# Patient Record
Sex: Female | Born: 1945
Health system: Southern US, Community
[De-identification: ages and names within clinical notes are randomized; demographics above are authoritative.]

## PROBLEM LIST (undated history)

## (undated) DIAGNOSIS — K219 Gastro-esophageal reflux disease without esophagitis: Secondary | ICD-10-CM

## (undated) DIAGNOSIS — D649 Anemia, unspecified: Secondary | ICD-10-CM

## (undated) DIAGNOSIS — I10 Essential (primary) hypertension: Secondary | ICD-10-CM

## (undated) DIAGNOSIS — J439 Emphysema, unspecified: Secondary | ICD-10-CM

## (undated) DIAGNOSIS — J449 Chronic obstructive pulmonary disease, unspecified: Secondary | ICD-10-CM

## (undated) DIAGNOSIS — C801 Malignant (primary) neoplasm, unspecified: Secondary | ICD-10-CM

## (undated) DIAGNOSIS — F419 Anxiety disorder, unspecified: Secondary | ICD-10-CM

## (undated) DIAGNOSIS — M810 Age-related osteoporosis without current pathological fracture: Secondary | ICD-10-CM

## (undated) DIAGNOSIS — M1990 Unspecified osteoarthritis, unspecified site: Secondary | ICD-10-CM

## (undated) DIAGNOSIS — E079 Disorder of thyroid, unspecified: Secondary | ICD-10-CM

## (undated) HISTORY — DX: Disorder of thyroid, unspecified: E07.9

## (undated) HISTORY — PX: JOINT REPLACEMENT: SHX530

## (undated) HISTORY — DX: Malignant (primary) neoplasm, unspecified: C80.1

## (undated) HISTORY — PX: ABDOMINAL HYSTERECTOMY: SHX81

## (undated) HISTORY — DX: Age-related osteoporosis without current pathological fracture: M81.0

## (undated) HISTORY — DX: Emphysema, unspecified: J43.9

## (undated) HISTORY — DX: Anemia, unspecified: D64.9

## (undated) HISTORY — DX: Unspecified osteoarthritis, unspecified site: M19.90

## (undated) HISTORY — DX: Anxiety disorder, unspecified: F41.9

## (undated) HISTORY — DX: Gastro-esophageal reflux disease without esophagitis: K21.9

## (undated) HISTORY — DX: Chronic obstructive pulmonary disease, unspecified: J44.9

## (undated) HISTORY — PX: BREAST SURGERY: SHX581

---

## 2004-12-05 HISTORY — PX: TOTAL KNEE ARTHROPLASTY: SHX125

## 2011-10-31 ENCOUNTER — Encounter: Payer: Self-pay | Admitting: *Deleted

## 2011-10-31 ENCOUNTER — Emergency Department
Admission: EM | Admit: 2011-10-31 | Discharge: 2011-10-31 | Disposition: A | Discharge status: Home / Self Care | Payer: Self-pay | Source: Home / Self Care | Attending: Emergency Medicine | Admitting: Emergency Medicine

## 2011-10-31 DIAGNOSIS — S61209A Unspecified open wound of unspecified finger without damage to nail, initial encounter: Secondary | ICD-10-CM

## 2011-10-31 HISTORY — DX: Essential (primary) hypertension: I10

## 2011-10-31 MED ORDER — CEPHALEXIN 500 MG PO CAPS: 500.0000 mg | ORAL_CAPSULE | Freq: Two times a day (BID) | ORAL | Status: AC

## 2011-10-31 MED ORDER — TETANUS-DIPHTH-ACELL PERTUSSIS 5-2.5-18.5 LF-MCG/0.5 IM SUSP
0.5000 mL | Freq: Once | INTRAMUSCULAR | Status: AC
  Administered 2011-10-31: 0.5 mL via INTRAMUSCULAR

## 2011-10-31 NOTE — ED Provider Notes (Addendum)
History     CSN: [id number] Arrival date & time: 10/31/2011  4:41 PM   First MD Initiated Contact with Patient 10/31/11 1732      Chief Complaint  Patient presents with  . Extremity Laceration    (Consider location/radiation/quality/duration/timing/severity/associated sxs/prior treatment) HPI This is a right-handed female who comes in today complaining of a laceration on her left pinky finger. She slammed it in a door but does not think is broken and does not want an x-ray. She is wondering if she needed stitches. She did notice some bleeding from the wound and some throbbing pain. She has not been using any medicines or modalities at this point. She does not report having difficulty moving her finger.  Past Medical History  Diagnosis Date  . Hypertension   . Asthma     Past Surgical History  Procedure Date  . Joint replacement     LT KNEE  . Abdominal hysterectomy     Family History  Problem Relation Age of Onset  . Emphysema Mother     History  Substance Use Topics  . Smoking status: Former Games developer  . Smokeless tobacco: Not on file  . Alcohol Use: Yes     SOCIALLY    OB History    Grav Para Term Preterm Abortions TAB SAB Ect Mult Living                  Review of Systems  Allergies  Floxin; Other; and Tetracyclines & related  Home Medications   Current Outpatient Rx  Name Route Sig Dispense Refill  . OLMESARTAN MEDOXOMIL 20 MG PO TABS Oral Take 20 mg by mouth daily.      . CEPHALEXIN 500 MG PO CAPS Oral Take 1 capsule (500 mg total) by mouth 2 (two) times daily. 14 capsule 0    BP 151/96  Pulse 68  Temp(Src) 98.1 F (36.7 C) (Oral)  Resp 16  Ht 5' 1.5" (1.562 m)  Wt 130 lb (58.968 kg)  BMI 24.17 kg/m2  SpO2 98%  Physical Exam  Nursing note and vitals reviewed. Constitutional: She is oriented to person, place, and time. She appears well-developed and well-nourished.  HENT:  Head: Normocephalic and atraumatic.  Neck: Neck supple.    Cardiovascular: Regular rhythm and normal heart sounds.   Pulmonary/Chest: Effort normal and breath sounds normal. No respiratory distress.  Neurological: She is alert and oriented to person, place, and time.  Skin: Skin is warm and dry.       Left pinky finger on the radial aspect has a 1 cm laceration, very superficial, with a slight amount of fatty tissue extruding from that. The wound is very well approximated there is slight bruising and swelling to the area. She has full range of motion with flexion and extension of her DIPs, PIPs, and MCP. Her distal sensation and capillary refill is normal.  Psychiatric: She has a normal mood and affect. Her speech is normal.    ED Course  Procedures (including critical care time)  Labs Reviewed - No data to display No results found.   1. Wound, open, finger       MDM   I discussed with the patient I do not feel that she requires any stitches today. The wound come back very nicely and there does not to be any tension on it and is not associated with with a joint. However since she does fashion the door and does have a laceration I do feel it appropriate  to at least treated mildly with antibiotics. We also did wrap it up with Dermabond, Steri-Strips, and placed her in a very small finger splint to limit her movement over the next couple days. 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 She should also keep covered with a rubber glove to prevent it from getting wet. If any further signs of infection or worsening, then she needs to call her physician or call us. In the meantime she can expect some throbbing pain, so she can take ibuprofen or Tylenol or use ice for that. In addition as I stated in the history of present illness section she would like to hold off on getting x-ray for now. She may indeed have a broken distal phalanx such as a tuft fracture, however I  think this can wait for a few days and she will return to clinic if she is having further pain and would like an x-ray.     Lily Kocher, MD 10/31/11 1749  Lily Kocher, MD 10/31/11 [phone]

## 2011-10-31 NOTE — ED Notes (Signed)
Pt c/o laceration LT 5th digit x today, she slammed it in a car door.

## 2012-01-30 LAB — CBC AND DIFFERENTIAL
Hemoglobin: 13.1 g/dL (ref 12.0–16.0)
Platelets: 238 10*3/uL (ref 150–399)
WBC: 8.7 10^3/mL

## 2012-01-30 LAB — LIPID PANEL
Cholesterol: 181 mg/dL (ref 0–200)
HDL: 72 mg/dL — AB (ref 35–70)

## 2012-01-30 LAB — HEPATIC FUNCTION PANEL: AST: 25 U/L (ref 13–35)

## 2012-01-30 LAB — BASIC METABOLIC PANEL: Glucose: 74 mg/dL

## 2012-05-16 DIAGNOSIS — M778 Other enthesopathies, not elsewhere classified: Secondary | ICD-10-CM | POA: Insufficient documentation

## 2012-05-16 DIAGNOSIS — G5600 Carpal tunnel syndrome, unspecified upper limb: Secondary | ICD-10-CM | POA: Insufficient documentation

## 2013-01-31 ENCOUNTER — Encounter: Payer: Self-pay | Admitting: *Deleted

## 2013-01-31 ENCOUNTER — Encounter: Payer: Self-pay | Admitting: Family Medicine

## 2013-01-31 ENCOUNTER — Ambulatory Visit (INDEPENDENT_AMBULATORY_CARE_PROVIDER_SITE_OTHER): Payer: Medicare Other | Admitting: Family Medicine

## 2013-01-31 VITALS — BP 139/93 | HR 88 | Temp 97.9°F | Ht 61.75 in | Wt 137.0 lb

## 2013-01-31 DIAGNOSIS — J189 Pneumonia, unspecified organism: Secondary | ICD-10-CM | POA: Insufficient documentation

## 2013-01-31 DIAGNOSIS — E876 Hypokalemia: Secondary | ICD-10-CM

## 2013-01-31 DIAGNOSIS — M810 Age-related osteoporosis without current pathological fracture: Secondary | ICD-10-CM | POA: Insufficient documentation

## 2013-01-31 DIAGNOSIS — B009 Herpesviral infection, unspecified: Secondary | ICD-10-CM | POA: Insufficient documentation

## 2013-01-31 DIAGNOSIS — Z09 Encounter for follow-up examination after completed treatment for conditions other than malignant neoplasm: Secondary | ICD-10-CM

## 2013-01-31 DIAGNOSIS — I10 Essential (primary) hypertension: Secondary | ICD-10-CM | POA: Insufficient documentation

## 2013-01-31 DIAGNOSIS — D72829 Elevated white blood cell count, unspecified: Secondary | ICD-10-CM

## 2013-01-31 MED ORDER — ACYCLOVIR 800 MG PO TABS: 800.0000 mg | ORAL_TABLET | Freq: Two times a day (BID) | ORAL | Status: DC

## 2013-01-31 NOTE — Progress Notes (Signed)
CC: Margaret [name] is a 67 y.o. female is here for Establish Care and hospital f/u   Subjective: HPI:  Very pleasant 67 year old here to establish care.  Patient was admitted to the hospital on February 22 for acute onset shortness of breath, fever, fatigue with cough. She was found to have right upper lobe pneumonia and was started on Rocephin and Zithromax. She had a white count of 26 and elevated to 35 24 hours after starting Solu-Medrol.  She was requiring oxygen the hospital but at time of discharge had normal oxygen saturations. She was discharged on February 24 with a prednisone taper, 2 days of azithromycin and 7 days of Omnicef which she confirms she is taking as prescribed. She tells me she has a mild cough but she is feeling remarkably better. She denies any shortness of breath nor fatigue. She denies confusion, fevers, chills, or lack of appetite. She woke up this morning feeling energy wise that she is almost back to normal. She has a history of asthma that may in fact be COPD for which she takes Advair twice a day. She denies chest pain, productive cough, nor blood in the sputum. Of note blood cultures were negative in the hospital.  During her admission she was noted to have hypokalemia with discharge potassium at 3.8 after receiving oral potassium of an unknown amount.  During her admission she was noted to have hypophosphatemia at 1.5 and was normalized after receiving replacement therapy at an unknown dose.  She has a history of hyper-tension with no outside or hospital blood pressures to report that she believes that she has been well-controlled with Benicar once a day.  She has a blister on her left upper and lower lip that is quite painful. The lower lip started while hospitalized, upper lip has just begun within the last 12 hours. She has had one episode of this before years ago. She denies history of genital lesions similar in appearance or sensation.  Review of Systems -  General ROS: negative for - chills, fever, night sweats, weight gain or weight loss Ophthalmic ROS: negative for - decreased vision Psychological ROS: negative for - anxiety or depression ENT ROS: negative for - hearing change, nasal congestion, tinnitus or allergies Hematological and Lymphatic ROS: negative for - bleeding problems, bruising or swollen lymph nodes Breast ROS: negative Respiratory ROS:  shortness of breath, or wheezing Cardiovascular ROS: no chest pain or dyspnea on exertion Gastrointestinal ROS: no abdominal pain, change in bowel habits, or black or bloody stools Genito-Urinary ROS: negative for - genital discharge, genital ulcers, incontinence or abnormal bleeding from genitals Musculoskeletal ROS: negative for - joint pain or muscle pain Neurological ROS: negative for - headaches or memory loss Dermatological ROS: negative for lumps, mole changes, rash and skin lesion changes.   Past Medical History  Diagnosis Date  . Asthma   . Hypertension      Family History  Problem Relation Age of Onset  . Emphysema Mother      History  Substance Use Topics  . Smoking status: Former Smoker    Quit date: 02/01/2008  . Smokeless tobacco: Not on file  . Alcohol Use: Yes     Comment: SOCIALLY     Objective: Filed Vitals:   01/31/13 1106  BP: 139/93  Pulse: 88  Temp: 97.9 F (36.6 C)    General: Alert and Oriented, No Acute Distress HEENT: Pupils equal, round, reactive to light. Conjunctivae clear.  External ears unremarkable, canals clear with intact  TMs with appropriate landmarks.  Middle ear appears open without effusion. Pink inferior turbinates.  Moist mucous membranes, pharynx without inflammation nor lesions.  Neck supple without palpable lymphadenopathy nor abnormal masses. There is a 3 cm erythematous ulceration on the left lower lip and a 1 cm similar ulceration on the left upper lip tender to the touch Lungs: Clear to auscultation bilaterally, no  wheezing/ronchi/rales.  Comfortable work of breathing. Good air movement. Cardiac: Regular rate and rhythm. Normal S1/S2.  No murmurs, rubs, nor gallops.   Abdomen: Soft and nontender. Extremities: No peripheral edema.  Strong peripheral pulses.  Mental Status: No depression, anxiety, nor agitation. Skin: Warm and dry.  Assessment & Plan: [place] was seen today for establish care and hospital f/u.  Diagnoses and associated orders for this visit:  Hospital discharge follow-up  CAP (community acquired pneumonia)  Hypokalemia - BASIC METABOLIC PANEL WITH GFR  Essential hypertension, benign  Hypophosphatemia - Phosphorus  Leukocytosis, unspecified - CBC w/Diff  HSV infection - acyclovir (ZOVIRAX) 800 MG tablet; Take 1 tablet (800 mg total) by mouth 2 (two) times daily. Take a regimen of five days as soon as fever blisters occur.   Community acquired pneumonia: Improving overall resolved, feel that there is no need for chest x-ray as her lung exam shows no abnormalities. Continue prednisone taper and remaining Omnicef. Hypokalemia: A symptomatically however will recheck bmp Essential hypertension: Controlled, continue Benicar Hypophosphatemia: Will need recheck of phosphorus Leukocytosis: Recheck CBC to ensure decline in white count HSV infection: Uncontrolled, provided with as needed acyclovir regimen   Return in about 4 weeks (around 02/28/2013).

## 2013-02-01 ENCOUNTER — Encounter: Payer: Self-pay | Admitting: Family Medicine

## 2013-02-01 DIAGNOSIS — R739 Hyperglycemia, unspecified: Secondary | ICD-10-CM | POA: Insufficient documentation

## 2013-02-01 LAB — CBC WITH DIFFERENTIAL/PLATELET
Basophils Absolute: 0.1 10*3/uL (ref 0.0–0.1)
Eosinophils Absolute: 0.1 10*3/uL (ref 0.0–0.7)
Eosinophils Relative: 1 % (ref 0–5)
MCH: 32 pg (ref 26.0–34.0)
MCV: 93.4 fL (ref 78.0–100.0)
Platelets: 314 10*3/uL (ref 150–400)
RDW: 13.8 % (ref 11.5–15.5)

## 2013-02-01 LAB — BASIC METABOLIC PANEL WITH GFR
BUN: 17 mg/dL (ref 6–23)
Calcium: 9.4 mg/dL (ref 8.4–10.5)
Creat: 0.87 mg/dL (ref 0.50–1.10)
GFR, Est African American: 80 mL/min
Glucose, Bld: 131 mg/dL — ABNORMAL HIGH (ref 70–99)

## 2013-02-22 ENCOUNTER — Encounter: Payer: Self-pay | Admitting: *Deleted

## 2013-02-22 ENCOUNTER — Encounter: Payer: Self-pay | Admitting: Family Medicine

## 2013-02-22 DIAGNOSIS — K5730 Diverticulosis of large intestine without perforation or abscess without bleeding: Secondary | ICD-10-CM

## 2013-02-22 DIAGNOSIS — K602 Anal fissure, unspecified: Secondary | ICD-10-CM

## 2013-03-01 ENCOUNTER — Encounter: Payer: Self-pay | Admitting: Family Medicine

## 2013-03-01 DIAGNOSIS — M719 Bursopathy, unspecified: Secondary | ICD-10-CM

## 2013-03-01 DIAGNOSIS — M67919 Unspecified disorder of synovium and tendon, unspecified shoulder: Secondary | ICD-10-CM | POA: Insufficient documentation

## 2013-03-06 ENCOUNTER — Encounter: Payer: Self-pay | Admitting: Family Medicine

## 2013-03-06 ENCOUNTER — Ambulatory Visit (INDEPENDENT_AMBULATORY_CARE_PROVIDER_SITE_OTHER): Payer: Medicare Other | Admitting: Family Medicine

## 2013-03-06 VITALS — BP 128/92 | HR 91 | Temp 98.2°F | Wt 134.0 lb

## 2013-03-06 DIAGNOSIS — K920 Hematemesis: Secondary | ICD-10-CM | POA: Insufficient documentation

## 2013-03-06 NOTE — Patient Instructions (Addendum)
128/92 Pulse 91 Resp Temp 98.2 F (36.8 C) Temp src Oral SpO2 Weight 134 lb (60.782 kg)  Hemoglobin 12.5  From now until GI appointment drink only clear fluids, for any vomiting of black or coffee ground appearing vomit go directly to ED.  For lightheadedness, shortness of breath, chest pain, or any other sudden decline in overall sense of wellbeing go directly to ED.  Do not take advil, motrin, ibuprofen, aspirin, or any other non-steroidal antiinflammatory until cleared by GI.

## 2013-03-06 NOTE — Progress Notes (Signed)
CC: Margaret [name] is a 67 y.o. female is here for Emesis   Subjective: HPI:  Patient presents with complaints of vomiting. She reports waking up at 4 AM today with a moderate cough and mild wheezing that did not improve with albuterol inhaler x4. This was soon followed by coughing up a thin brown substance followed by 3 "severe"episodes of vomiting a black material consistent of fine black particles with no recognizable food. Since then she reports mild to almost not recognizable nausea. She reports lack of appetite since the episode. She has had a normal bowel movement today without tar -like appearance nor blackening.  She reports that overall she just does not feel right, almost like an asthma attack is about to happen, but overall hard for her to describe.  Symptoms came on suddenly, slightly improved without intervention, has never had this before.   She denies abdominal pain, confusion, motor or sensory disturbances, fevers, chills, shortness of breath, tachycardia, nor lightheadedness. She denies chest pain nor back pain. She ate a hamburger last night but believed it was well cooked and her husband who ate the same meal is not sick today.  She reports that she's been taking at least 4 ibuprofen every morning for months due to left shoulder pain. She does not drink alcohol and is not a smoker. She does take aspirin daily    Review Of Systems Outlined In HPI  Past Medical History  Diagnosis Date  . Asthma   . Hypertension      Family History  Problem Relation Age of Onset  . Emphysema Mother      History  Substance Use Topics  . Smoking status: Former Smoker    Quit date: 02/01/2008  . Smokeless tobacco: Not on file  . Alcohol Use: Yes     Comment: SOCIALLY     Objective: Filed Vitals:   03/06/13 0941  BP: 128/92  Pulse: 91  Temp: 98.2 F (36.8 C)    General: Alert and Oriented, No Acute Distress HEENT: Pupils equal, round, reactive to light. Conjunctivae clear.   External ears unremarkable, canals clear with intact TMs with appropriate landmarks.  Middle ear appears open without effusion. Pink inferior turbinates.  Moist mucous membranes, pharynx without inflammation nor lesions.  Neck supple without palpable lymphadenopathy nor abnormal masses. Lungs: Clear to auscultation bilaterally, no wheezing/ronchi/rales.  Comfortable work of breathing. Good air movement. Cardiac: Regular rate and rhythm. Normal S1/S2.  No murmurs, rubs, nor gallops.   Abdomen: Hyperactive bowel sounds, mild epigastric pain to palpation, no right upper quadrant pain or pain elsewhere in the abdomen. No palpable abnormalities. No rebound tenderness Extremities: No peripheral edema.  Strong peripheral pulses.  Mental Status: No depression, anxiety, nor agitation. Skin: Warm and dry.  Assessment & Plan: Margaret [name] was seen today for emesis.  Diagnoses and associated orders for this visit:  Coffee ground emesis - POCT hemoglobin - Ambulatory referral to Gastroenterology    Coffee ground emesis: Reassuring Hemoglobin obtained 12.5 relative to 12.5 at last visit a month ago. Discussed with patient her description of emesis and history of chronic nonsteroidal anti-inflammatory use highly suspicious for possibility of bleeding ulcer. Given stable and normal vitals and exam today feel that she is stable for outpatient followup with digestive health specialists today for possible consideration of upper endoscopy. Discussed signs and symptoms that would require immediate emergency room or 911 attention. Avoid nonsteroidal anti-inflammatories and stick to a clear diet until her conditions from GI.  Return in about  1 day (around 03/07/2013) for After GI visit.

## 2013-03-07 ENCOUNTER — Encounter: Payer: Self-pay | Admitting: Family Medicine

## 2013-03-07 ENCOUNTER — Other Ambulatory Visit: Payer: Self-pay | Admitting: Family Medicine

## 2013-03-07 DIAGNOSIS — K920 Hematemesis: Secondary | ICD-10-CM

## 2013-03-11 ENCOUNTER — Encounter: Payer: Self-pay | Admitting: Family Medicine

## 2013-03-20 ENCOUNTER — Encounter: Payer: Self-pay | Admitting: Family Medicine

## 2013-05-01 ENCOUNTER — Encounter: Payer: Self-pay | Admitting: Family Medicine

## 2013-06-17 ENCOUNTER — Encounter: Payer: Self-pay | Admitting: Family Medicine

## 2013-06-19 ENCOUNTER — Encounter: Payer: Self-pay | Admitting: Family Medicine

## 2013-09-23 ENCOUNTER — Telehealth: Payer: Self-pay | Admitting: *Deleted

## 2013-09-23 NOTE — Telephone Encounter (Signed)
Pt left a message stating she was seeing an orthopaedic doc who reccommended surgery for her back. Pt wanted a second opinion and wants a referral to another physician. I transferred her up front to be scheduled with Dr. Karie Schwalbe

## 2013-09-26 ENCOUNTER — Encounter: Payer: Self-pay | Admitting: Family Medicine

## 2013-09-26 DIAGNOSIS — M549 Dorsalgia, unspecified: Secondary | ICD-10-CM

## 2013-09-27 ENCOUNTER — Telehealth: Payer: Self-pay | Admitting: Family Medicine

## 2013-09-27 NOTE — Telephone Encounter (Signed)
Pt states she just had an MRI at the spine [facility] 1 month ago. I suggested to her that she have that clinic send over all imaging to Dr. Tyrone Sage office

## 2013-09-27 NOTE — Telephone Encounter (Signed)
Sue Lush, Will you please let Mrs. Graven know that Elease Hashimoto found out.  I don't have any record of her ever having one with [place].  I'd recommend she f/u with me if she's interested going through with the Dr. Tyrone Sage referral so I can determine what MRI order is needed.

## 2013-09-27 NOTE — Telephone Encounter (Signed)
Contacted [facility] for an appt with patient and was told by Dennie Bible that patient would need to have an MRI done before an appt can be scheduled.

## 2013-09-30 ENCOUNTER — Ambulatory Visit: Payer: Self-pay | Admitting: Sports Medicine

## 2013-09-30 ENCOUNTER — Ambulatory Visit: Payer: Self-pay | Admitting: Family Medicine

## 2013-10-10 ENCOUNTER — Other Ambulatory Visit: Payer: Self-pay

## 2013-10-14 ENCOUNTER — Encounter: Payer: Self-pay | Admitting: Family Medicine

## 2013-10-14 ENCOUNTER — Ambulatory Visit (INDEPENDENT_AMBULATORY_CARE_PROVIDER_SITE_OTHER): Payer: Medicare Other | Admitting: Family Medicine

## 2013-10-14 VITALS — BP 160/91 | HR 67 | Wt 140.0 lb

## 2013-10-14 DIAGNOSIS — I10 Essential (primary) hypertension: Secondary | ICD-10-CM

## 2013-10-14 DIAGNOSIS — M48061 Spinal stenosis, lumbar region without neurogenic claudication: Secondary | ICD-10-CM

## 2013-10-14 DIAGNOSIS — M810 Age-related osteoporosis without current pathological fracture: Secondary | ICD-10-CM

## 2013-10-14 DIAGNOSIS — Z23 Encounter for immunization: Secondary | ICD-10-CM

## 2013-10-14 MED ORDER — LISINOPRIL 30 MG PO TABS: 30.0000 mg | ORAL_TABLET | Freq: Every day | ORAL | Status: DC

## 2013-10-14 MED ORDER — RALOXIFENE HCL 60 MG PO TABS: 60.0000 mg | ORAL_TABLET | Freq: Every day | ORAL | Status: DC

## 2013-10-14 NOTE — Progress Notes (Signed)
CC: Margaret [name] is a 67 y.o. female is here for Hypertension and Medication Management   Subjective: HPI:  Followup essential hypertension: Continues on Benicar daily, systolics at home never greater than 130 unknown diastolics.  Had once been on lisinopril would like to go back on it due to cost of Benicar, uncertain of prior dosage.  Denies chest pain, shortness of breath, orthopnea, peripheral edema, nor motor or sensory disturbances  Followup osteoporosis: Her gastrointestinal doctor has encouraged her to not take Fosamax due to her ulceration at most recent endoscopy. She's been off this medication since early summer.  She would like to consider other oral medications or injectable medications for osteoporosis. She is to take vitamin D and calcium  Followup spinal stenosis: She was able to get former MRI results and will be in a second opinion with a neurosurgery referral was placed already she has an appointment in December. She denies any new motor or sensory disturbances or weakness in extremities. Her current orthopedist is prescribing her hydrocodone which she is taking 7.5 mg every morning before work. It sounds like they're planning on doing a lumbar fusion, the way she describes her most recent x-ray makes it sound like she has spondylolisthesis of one of the lumbar vertebrae.  Review Of Systems Outlined In HPI  Past Medical History  Diagnosis Date  . Asthma   . Hypertension      Family History  Problem Relation Age of Onset  . Emphysema Mother      History  Substance Use Topics  . Smoking status: Former Smoker    Quit date: 02/01/2008  . Smokeless tobacco: Not on file  . Alcohol Use: Yes     Comment: SOCIALLY     Objective: Filed Vitals:   10/14/13 0914  BP: 160/91  Pulse: 67    General: Alert and Oriented, No Acute Distress HEENT: Pupils equal, round, reactive to light. Conjunctivae clear.  Moist mucous membranes pharynx unremarkable Lungs: Clear to  auscultation bilaterally, no wheezing/ronchi/rales.  Comfortable work of breathing. Good air movement. Cardiac: Regular rate and rhythm. Normal S1/S2.  No murmurs, rubs, nor gallops.   Extremities: No peripheral edema.  Strong peripheral pulses.  Mental Status: No depression, anxiety, nor agitation. Skin: Warm and dry.  Assessment & Plan: [place] was seen today for hypertension and medication management.  Diagnoses and associated orders for this visit:  Osteoporosis, unspecified - raloxifene (EVISTA) 60 MG tablet; Take 1 tablet (60 mg total) by mouth daily.  Essential hypertension, benign - lisinopril (PRINIVIL,ZESTRIL) 30 MG tablet; Take 1 tablet (30 mg total) by mouth daily.  Spinal stenosis of lumbar region    Osteoporosis: Uncontrolled discussed injectable medications however anticipate this will be quite expensive, therefore she is quite to start evista, continue calcium and vitamin D Essential hypertension: Uncontrolled restart lisinopril Spinal stenosis: Managed by neurosurgery and orthopedics Per her request she was given information about [facility] is as she is considering bioidentical hormone replacement.  Return in about 4 weeks (around 11/11/2013).

## 2013-10-15 ENCOUNTER — Encounter: Payer: Self-pay | Admitting: Family Medicine

## 2013-10-15 DIAGNOSIS — K8689 Other specified diseases of pancreas: Secondary | ICD-10-CM

## 2013-10-15 NOTE — Telephone Encounter (Signed)
Pancreatic mass on MRI, start with u/s

## 2013-10-17 ENCOUNTER — Ambulatory Visit (INDEPENDENT_AMBULATORY_CARE_PROVIDER_SITE_OTHER): Payer: Medicare Other

## 2013-10-17 DIAGNOSIS — K8689 Other specified diseases of pancreas: Secondary | ICD-10-CM

## 2013-10-17 DIAGNOSIS — R935 Abnormal findings on diagnostic imaging of other abdominal regions, including retroperitoneum: Secondary | ICD-10-CM

## 2013-10-21 ENCOUNTER — Telehealth: Payer: Self-pay | Admitting: *Deleted

## 2013-10-21 ENCOUNTER — Other Ambulatory Visit: Payer: Self-pay | Admitting: Physician Assistant

## 2013-10-21 DIAGNOSIS — K8689 Other specified diseases of pancreas: Secondary | ICD-10-CM

## 2013-10-21 NOTE — Telephone Encounter (Signed)
PA obtained for CT ABD w/contrast. Auth # [id number].  Meyer Cory, LPN

## 2013-10-22 ENCOUNTER — Ambulatory Visit (HOSPITAL_BASED_OUTPATIENT_CLINIC_OR_DEPARTMENT_OTHER)
Admission: RE | Admit: 2013-10-22 | Discharge: 2013-10-22 | Disposition: A | Discharge status: Home / Self Care | Payer: Medicare Other | Source: Ambulatory Visit | Attending: Physician Assistant | Admitting: Physician Assistant

## 2013-10-22 DIAGNOSIS — K449 Diaphragmatic hernia without obstruction or gangrene: Secondary | ICD-10-CM | POA: Insufficient documentation

## 2013-10-22 DIAGNOSIS — K8689 Other specified diseases of pancreas: Secondary | ICD-10-CM

## 2013-10-22 DIAGNOSIS — R110 Nausea: Secondary | ICD-10-CM | POA: Insufficient documentation

## 2013-10-22 LAB — CREATININE, SERUM: Creat: 0.9 mg/dL (ref 0.50–1.10)

## 2013-10-22 MED ORDER — IOHEXOL 350 MG/ML SOLN
100.0000 mL | Freq: Once | INTRAVENOUS | Status: AC | PRN
  Administered 2013-10-22: 100 mL via INTRAVENOUS

## 2013-11-17 ENCOUNTER — Emergency Department
Admission: EM | Admit: 2013-11-17 | Discharge: 2013-11-17 | Disposition: A | Discharge status: Home / Self Care | Payer: Medicare Other | Source: Home / Self Care | Attending: Family Medicine | Admitting: Family Medicine

## 2013-11-17 ENCOUNTER — Encounter: Payer: Self-pay | Admitting: Emergency Medicine

## 2013-11-17 DIAGNOSIS — J069 Acute upper respiratory infection, unspecified: Secondary | ICD-10-CM

## 2013-11-17 DIAGNOSIS — J45909 Unspecified asthma, uncomplicated: Secondary | ICD-10-CM

## 2013-11-17 MED ORDER — AZITHROMYCIN 250 MG PO TABS: ORAL_TABLET | ORAL | Status: DC

## 2013-11-17 MED ORDER — METHYLPREDNISOLONE SODIUM SUCC 125 MG IJ SOLR
80.0000 mg | Freq: Once | INTRAMUSCULAR | Status: AC
  Administered 2013-11-17: 80 mg via INTRAMUSCULAR

## 2013-11-17 NOTE — ED Provider Notes (Addendum)
CSN: [id number]     Arrival date & time 11/17/13  1417 History   First MD Initiated Contact with Patient 11/17/13 1427     Chief Complaint  Patient presents with  . URI    HPI  URI Symptoms Onset: 2-3 days  Description: rhinorrhea,nasal congestion, cough, increased sputum production, intermittent epistaxis with blowing nose  Modifying factors:  Baseline hx/o asthma/COPD. Minimal wheezing, cough  Symptoms Nasal discharge: yes Fever: no Sore throat: no Cough: yes Wheezing: minimal  Ear pain: no GI symptoms: no Sick contacts: no  Red Flags  Stiff neck: no Dyspnea: no Rash: no Swallowing difficulty: no  Sinusitis Risk Factors Headache/face pain: no Double sickening: no tooth pain: no  Allergy Risk Factors Sneezing: no Itchy scratchy throat: no Seasonal symptoms: no  Flu Risk Factors Headache: no muscle aches: no severe fatigue: no   Past Medical History  Diagnosis Date  . Asthma   . Hypertension    Past Surgical History  Procedure Laterality Date  . Joint replacement      LT KNEE  . Abdominal hysterectomy    . Total knee arthroplasty  2006   Family History  Problem Relation Age of Onset  . Emphysema Mother    History  Substance Use Topics  . Smoking status: Former Smoker    Quit date: 02/01/2008  . Smokeless tobacco: Not on file  . Alcohol Use: Yes     Comment: SOCIALLY   OB History   Grav Para Term Preterm Abortions TAB SAB Ect Mult Living                 Review of Systems  All other systems reviewed and are negative.    Allergies  Floxin; Floxin; Neosporin; Other; and Tetracyclines & related  Home Medications   Current Outpatient Rx  Name  Route  Sig  Dispense  Refill  . acyclovir (ZOVIRAX) 800 MG tablet   Oral   Take 1 tablet (800 mg total) by mouth 2 (two) times daily. Take a regimen of five days as soon as fever blisters occur.   30 tablet   1   . citalopram (CELEXA) 20 MG tablet   Oral   Take 20 mg by mouth daily.          . Fluticasone-Salmeterol (ADVAIR DISKUS) 250-50 MCG/DOSE AEPB   Inhalation   Inhale 1 puff into the lungs every 12 (twelve) hours.         [name] lisinopril (PRINIVIL,ZESTRIL) 30 MG tablet   Oral   Take 1 tablet (30 mg total) by mouth daily.   30 tablet   4   . omeprazole (PRILOSEC) 20 MG capsule   Oral   Take 1 capsule (20 mg total) by mouth 2 (two) times daily.   180 capsule   3   . raloxifene (EVISTA) 60 MG tablet   Oral   Take 1 tablet (60 mg total) by mouth daily.   30 tablet   6   . traMADol (ULTRAM) 50 MG tablet   Oral   Take 50 mg by mouth every 6 (six) hours as needed for pain.          BP 136/80  Pulse 74  Temp(Src) 97.9 F (36.6 C) (Oral)  Ht 5\' 2"  (1.575 m)  Wt 139 lb 8 oz (63.277 kg)  BMI 25.51 kg/m2  SpO2 97% Physical Exam  Constitutional: She appears well-developed and well-nourished.  HENT:  Head: Normocephalic and atraumatic.  Right Ear: External ear normal.  Left Ear: External ear normal.  +nasal erythema, rhinorrhea bilaterally, + post oropharyngeal erythema  No visible blood in nasal passages.     Eyes: Conjunctivae are normal. Pupils are equal, round, and reactive to light.  Neck: Normal range of motion.  Cardiovascular: Normal rate and regular rhythm.   Pulmonary/Chest: Effort normal and breath sounds normal.  Abdominal: Soft.  Musculoskeletal: Normal range of motion.  Neurological: She is alert.  Skin: Skin is warm.    ED Course  Procedures (including critical care time) Labs Review Labs Reviewed - No data to display Imaging Review No results found.  EKG Interpretation    Date/Time:    Ventricular Rate:    PR Interval:    QRS Duration:   QT Interval:    QTC Calculation:   R Axis:     Text Interpretation:              MDM   1. URI (upper respiratory infection)   2. Unspecified asthma(493.90)    Likely viral process Depomedrol 80mg  IM x1 for lower resp obstructive disease ppx  (noted increased sputum  production-likely from nasal drainage).  Zpak if sxs fail to improve over next 7-10 days/worsen. Discussed this at length with pt.  Discussed infectious, ENT and resp red flags  follow up as needed.     The patient and/or caregiver has been counseled thoroughly with regard to treatment plan and/or medications prescribed including dosage, schedule, interactions, rationale for use, and possible side effects and they verbalize understanding. Diagnoses and expected course of recovery discussed and will return if not improved as expected or if the condition worsens. Patient and/or caregiver verbalized understanding.         Doree Albee, MD 11/17/13 1450  Doree Albee, MD 11/17/13 [phone]

## 2013-11-17 NOTE — ED Notes (Addendum)
Productive cough noted with green sputum, congestion, hoarseness, and cough, started Friday

## 2013-11-25 ENCOUNTER — Encounter: Payer: Self-pay | Admitting: Family Medicine

## 2013-11-25 ENCOUNTER — Ambulatory Visit (INDEPENDENT_AMBULATORY_CARE_PROVIDER_SITE_OTHER): Payer: Medicare Other

## 2013-11-25 ENCOUNTER — Ambulatory Visit (INDEPENDENT_AMBULATORY_CARE_PROVIDER_SITE_OTHER): Payer: Medicare Other | Admitting: Family Medicine

## 2013-11-25 VITALS — BP 140/91 | HR 82 | Wt 136.0 lb

## 2013-11-25 DIAGNOSIS — R5383 Other fatigue: Secondary | ICD-10-CM

## 2013-11-25 DIAGNOSIS — R5381 Other malaise: Secondary | ICD-10-CM

## 2013-11-25 DIAGNOSIS — J189 Pneumonia, unspecified organism: Secondary | ICD-10-CM

## 2013-11-25 DIAGNOSIS — R918 Other nonspecific abnormal finding of lung field: Secondary | ICD-10-CM

## 2013-11-25 DIAGNOSIS — R509 Fever, unspecified: Secondary | ICD-10-CM

## 2013-11-25 DIAGNOSIS — R05 Cough: Secondary | ICD-10-CM

## 2013-11-25 DIAGNOSIS — R059 Cough, unspecified: Secondary | ICD-10-CM

## 2013-11-25 MED ORDER — CEFTRIAXONE SODIUM 1 G IJ SOLR
1.0000 g | Freq: Once | INTRAMUSCULAR | Status: AC
  Administered 2013-11-25: 1 g via INTRAMUSCULAR

## 2013-11-25 MED ORDER — CLINDAMYCIN HCL 300 MG PO CAPS: 300.0000 mg | ORAL_CAPSULE | Freq: Three times a day (TID) | ORAL | Status: DC

## 2013-11-25 MED ORDER — CEFDINIR 300 MG PO CAPS: 300.0000 mg | ORAL_CAPSULE | Freq: Two times a day (BID) | ORAL | Status: DC

## 2013-11-25 NOTE — Progress Notes (Signed)
CC: Margaret [name] is a 67 y.o. female is here for Fatigue   Subjective: HPI:  2 weeks of fatigue cough and subjective fever, fatigue and cough described as moderate in severity barely any improvement since Depo-Medrol shot and starting azithromycin last week. Fever is described as mild and only subjective.  Shortness of breath occurring when walking long distances which is atypical for her. She continues on Advair but has not felt the need to use albuterol despite wheezing and increased sputum production.  Initially accompanied by facial pressure and nasal discharge which is now gone.  Symptoms are present all hours of the day nothing particularly makes better or worse.  Denies any gastrointestinal complaints, rashes, myalgias, known sick contacts, chest pain, nor blood in sputum.   Review Of Systems Outlined In HPI  Past Medical History  Diagnosis Date  . Asthma   . Hypertension      Family History  Problem Relation Age of Onset  . Emphysema Mother   . Stroke Other      History  Substance Use Topics  . Smoking status: Former Smoker    Quit date: 02/01/2008  . Smokeless tobacco: Not on file  . Alcohol Use: Yes     Comment: SOCIALLY     Objective: Filed Vitals:   11/25/13 0947  BP: 140/91  Pulse: 82    General: Alert and Oriented, No Acute Distress appears fatigued HEENT: Pupils equal, round, reactive to light. Conjunctivae clear.  External ears unremarkable, canals clear with intact TMs with appropriate landmarks.  Middle ear appears open without effusion. Pink inferior turbinates.  Moist mucous membranes, pharynx without inflammation nor lesions.  Neck supple without palpable lymphadenopathy nor abnormal masses. Lungs: Trace end expiratory wheezing in all lung fields no rhonchi or rales  Cardiac: Regular rate and rhythm. Normal S1/S2.  No murmurs, rubs, nor gallops.   Extremities: No peripheral edema.  Strong peripheral pulses.  Mental Status: No depression, anxiety, nor  agitation. Skin: Warm and dry.  Assessment & Plan: Margaret [name] was seen today for fatigue.  Diagnoses and associated orders for this visit:  Fatigue - DG Chest 2 View; Future  Fever, unspecified - DG Chest 2 View; Future  Cough - DG Chest 2 View; Future  CAP (community acquired pneumonia) - cefdinir (OMNICEF) 300 MG capsule; Take 1 capsule (300 mg total) by mouth 2 (two) times daily. - clindamycin (CLEOCIN) 300 MG capsule; Take 1 capsule (300 mg total) by mouth 3 (three) times daily. - cefTRIAXone (ROCEPHIN) injection 1 g; Inject 1 g into the muscle once.    She had similar symptoms a little under one year ago and was found to have a lobar pneumonia, checking x-ray today to rule out pneumonia.  X-ray does show a pneumonia localized a single lobe given the location will cover for aspiration pneumonia with clindamycin Will additionally cover for streptococcal pneumonia with Omnicef.Signs and symptoms requring emergent/urgent reevaluation were discussed with the patient.  Return if symptoms worsen or fail to improve.

## 2013-11-29 ENCOUNTER — Ambulatory Visit (INDEPENDENT_AMBULATORY_CARE_PROVIDER_SITE_OTHER): Payer: Medicare Other | Admitting: Family Medicine

## 2013-11-29 ENCOUNTER — Encounter: Payer: Self-pay | Admitting: Family Medicine

## 2013-11-29 VITALS — BP 116/78 | HR 85 | Temp 98.1°F | Wt 137.0 lb

## 2013-11-29 DIAGNOSIS — J441 Chronic obstructive pulmonary disease with (acute) exacerbation: Secondary | ICD-10-CM

## 2013-11-29 DIAGNOSIS — J189 Pneumonia, unspecified organism: Secondary | ICD-10-CM

## 2013-11-29 MED ORDER — PREDNISONE 20 MG PO TABS: ORAL_TABLET | ORAL | Status: AC

## 2013-11-29 MED ORDER — IPRATROPIUM-ALBUTEROL 0.5-2.5 (3) MG/3ML IN SOLN
3.0000 mL | RESPIRATORY_TRACT | Status: DC
  Administered 2013-11-29: 3 mL via RESPIRATORY_TRACT

## 2013-11-29 MED ORDER — CITALOPRAM HYDROBROMIDE 20 MG PO TABS: 20.0000 mg | ORAL_TABLET | Freq: Every day | ORAL | Status: DC

## 2013-11-29 MED ORDER — CEFTRIAXONE SODIUM 1 G IJ SOLR
1.0000 g | Freq: Once | INTRAMUSCULAR | Status: AC
  Administered 2013-11-29: 1 g via INTRAMUSCULAR

## 2013-11-29 NOTE — Progress Notes (Signed)
CC: Margaret [name] is a 67 y.o. female is here for Pneumonia   Subjective: HPI:  Complains of productive cough nonbloody and fatigue moderate to severe in severity which was significantly improved starting Tuesday afternoon however deteriorated starting yesterday evening. She feels like she is back to her state of health when diagnosed with pneumonia when she saw me back on Monday.  Has been using albuterol home for new complaint of wheezing despite using this 4 times a day no improvement of wheezing or. Reports subjective shortness of breath no worse than on Monday. She's continuing on cefdinir and clindamycin without missed doses are known side effects. Denies confusion, chills, night sweats, objective fever, nausea, vomiting, nor GI disturbance. She attributes her worsening symptoms to " doing too much than I was ready for" with respect to physical exertion of celebrating Christmas and shopping.  Of note pulmonary complaints began before starting azithromycin which did not help whatsoever.  Review Of Systems Outlined In HPI  Past Medical History  Diagnosis Date  . Asthma   . Hypertension      Family History  Problem Relation Age of Onset  . Emphysema Mother   . Stroke Other      History  Substance Use Topics  . Smoking status: Former Smoker    Quit date: 02/01/2008  . Smokeless tobacco: Not on file  . Alcohol Use: Yes     Comment: SOCIALLY     Objective: Filed Vitals:   11/29/13 1629  BP: 116/78  Pulse: 85  Temp: 98.1 F (36.7 C)    General: Alert and Oriented, No Acute Distress HEENT: Pupils equal, round, reactive to light. Conjunctivae clear.  External ears unremarkable, canals clear with intact TMs with appropriate landmarks.  Middle ear appears open without effusion. Pink inferior turbinates.  Moist mucous membranes, pharynx without inflammation nor lesions.  Neck supple without palpable lymphadenopathy nor abnormal masses. Lungs: Distant breath sounds without  rhonchi wheezing nor rales Cardiac: Regular rate and rhythm. Normal S1/S2.  No murmurs, rubs, nor gallops.   Extremities: No peripheral edema.  Strong peripheral pulses.  Mental Status: No depression, anxiety, nor agitation. Skin: Warm and dry.  Assessment & Plan: Margaret [name] was seen today for pneumonia.  Diagnoses and associated orders for this visit:  CAP (community acquired pneumonia)    Community acquired pneumonia: Suspect that her significant improvement earlier this week was due to Rocephin, we've arranged that she will receive another gram today and then a gram on a daily basis through urgent care this weekend and then also with our office for next week.  Stop cefdinir continue clindamycin for any possibility of aspiration pneumonia. Adding prednisone burst given lack of improvement with albuterol.    DuoNeb treatment provided she reports significant improvement in SOB and wheezing, repeat lung exam unchanged.  Signs and symptoms requring emergent/urgent reevaluation were discussed with the patient.  Return in about 1 day (around 11/30/2013).

## 2013-11-29 NOTE — Addendum Note (Signed)
Addended by: Laren Boom on: 11/29/2013 08:12 AM   Modules accepted: Orders

## 2013-11-29 NOTE — Patient Instructions (Signed)
Urgent Care for Rocephin Injection on Saturday and Sunday Saturday, 9 a.m. - 6 p.m. Sunday, 11 a.m. - 6 p.m.  Stop cefdinir antibiotic.

## 2013-11-30 ENCOUNTER — Emergency Department
Admission: EM | Admit: 2013-11-30 | Discharge: 2013-11-30 | Disposition: A | Discharge status: Home / Self Care | Payer: Medicare Other | Source: Home / Self Care

## 2013-12-01 ENCOUNTER — Emergency Department (INDEPENDENT_AMBULATORY_CARE_PROVIDER_SITE_OTHER)
Admission: EM | Admit: 2013-12-01 | Discharge: 2013-12-01 | Disposition: A | Discharge status: Home / Self Care | Payer: Medicare Other | Source: Home / Self Care

## 2013-12-01 DIAGNOSIS — J189 Pneumonia, unspecified organism: Secondary | ICD-10-CM

## 2013-12-01 NOTE — ED Notes (Signed)
Here for Rocephin 1 Gm IM per Dr. Ivan Anchors. Given in right upper outer quadrant.

## 2013-12-02 ENCOUNTER — Ambulatory Visit (INDEPENDENT_AMBULATORY_CARE_PROVIDER_SITE_OTHER): Payer: Medicare Other | Admitting: Family Medicine

## 2013-12-02 VITALS — BP 132/82 | HR 76

## 2013-12-02 DIAGNOSIS — J189 Pneumonia, unspecified organism: Secondary | ICD-10-CM

## 2013-12-02 MED ORDER — CEFTRIAXONE SODIUM 1 G IJ SOLR
1.0000 g | Freq: Once | INTRAMUSCULAR | Status: AC
  Administered 2013-12-02: 1 g via INTRAMUSCULAR

## 2013-12-02 NOTE — Progress Notes (Signed)
I was present for all necessary aspects of today's encounter. 

## 2013-12-02 NOTE — Progress Notes (Signed)
   Subjective:    Patient ID: Margaret [name], female    DOB: 1946/01/05, 67 y.o.   MRN: [id number] Pt here to receive Rocephin 1G injection x 4th dose of 7. Given in RUOQ with no complications.  Meyer Cory, LPN  HPI    Review of Systems     Objective:   Physical Exam        Assessment & Plan:

## 2013-12-03 ENCOUNTER — Encounter: Payer: Self-pay | Admitting: Family Medicine

## 2013-12-03 ENCOUNTER — Ambulatory Visit (INDEPENDENT_AMBULATORY_CARE_PROVIDER_SITE_OTHER): Payer: Self-pay | Admitting: Family Medicine

## 2013-12-03 VITALS — BP 181/93 | HR 69

## 2013-12-03 DIAGNOSIS — J189 Pneumonia, unspecified organism: Secondary | ICD-10-CM

## 2013-12-03 MED ORDER — CEFTRIAXONE SODIUM 1 G IJ SOLR
1.0000 g | Freq: Once | INTRAMUSCULAR | Status: AC
  Administered 2013-12-03: 1 g via INTRAMUSCULAR

## 2013-12-03 NOTE — Progress Notes (Signed)
I was present for all necessary aspects of today's encounter. 

## 2013-12-03 NOTE — Progress Notes (Signed)
   Subjective:    Patient ID: Margaret [name], female    DOB: 12-Nov-1946, 67 y.o.   MRN: [id number] Injection of Rocephin 1G #5 of 7 given in LUOQ with no complications. Pt's BP elevated today. States she has taken her medication. Informed pt to take her BP daily and keep record x 1 week and if still elevated to schedule an appt with PCP to f/u.  Meyer Cory, LPN  HPI    Review of Systems     Objective:   Physical Exam        Assessment & Plan:

## 2013-12-04 ENCOUNTER — Ambulatory Visit (INDEPENDENT_AMBULATORY_CARE_PROVIDER_SITE_OTHER): Payer: Medicare Other | Admitting: Family Medicine

## 2013-12-04 ENCOUNTER — Encounter: Payer: Self-pay | Admitting: *Deleted

## 2013-12-04 VITALS — BP 135/85 | HR 80

## 2013-12-04 DIAGNOSIS — J189 Pneumonia, unspecified organism: Secondary | ICD-10-CM

## 2013-12-04 MED ORDER — CEFTRIAXONE SODIUM 1 G IJ SOLR
1.0000 g | Freq: Once | INTRAMUSCULAR | Status: AC
  Administered 2013-12-04: 1 g via INTRAMUSCULAR

## 2013-12-04 NOTE — Progress Notes (Signed)
   Subjective:    Patient ID: Margaret [name], female    DOB: 1946-08-23, 67 y.o.   MRN: [id number]  HPI   Here for 6th rocephin injection. Pt states she is feeling 70-80% better Review of Systems     Objective:   Physical Exam        Assessment & Plan:  Given in RUQ without complication

## 2013-12-05 ENCOUNTER — Emergency Department (INDEPENDENT_AMBULATORY_CARE_PROVIDER_SITE_OTHER)
Admission: EM | Admit: 2013-12-05 | Discharge: 2013-12-05 | Disposition: A | Discharge status: Home / Self Care | Payer: Medicare Other | Source: Home / Self Care

## 2013-12-05 DIAGNOSIS — J189 Pneumonia, unspecified organism: Secondary | ICD-10-CM

## 2013-12-05 MED ORDER — CEFTRIAXONE SODIUM 1 G IJ SOLR
1.0000 g | Freq: Once | INTRAMUSCULAR | Status: AC
  Administered 2013-12-05: 1 g via INTRAMUSCULAR

## 2013-12-05 NOTE — ED Notes (Signed)
Margaret [name] is here for her 7th of 7 rocephin injections ordered by Dr. Ileene Rubens in primary care.

## 2013-12-06 ENCOUNTER — Ambulatory Visit: Payer: Self-pay

## 2013-12-07 NOTE — ED Notes (Signed)
For follow-up

## 2013-12-10 ENCOUNTER — Ambulatory Visit (INDEPENDENT_AMBULATORY_CARE_PROVIDER_SITE_OTHER): Payer: Medicare Other | Admitting: Family Medicine

## 2013-12-10 ENCOUNTER — Ambulatory Visit (INDEPENDENT_AMBULATORY_CARE_PROVIDER_SITE_OTHER): Payer: Medicare Other

## 2013-12-10 ENCOUNTER — Encounter: Payer: Self-pay | Admitting: Family Medicine

## 2013-12-10 VITALS — BP 129/93 | HR 91 | Temp 98.0°F | Wt 136.0 lb

## 2013-12-10 DIAGNOSIS — R05 Cough: Secondary | ICD-10-CM

## 2013-12-10 DIAGNOSIS — R059 Cough, unspecified: Secondary | ICD-10-CM

## 2013-12-10 DIAGNOSIS — R5383 Other fatigue: Secondary | ICD-10-CM

## 2013-12-10 DIAGNOSIS — R5381 Other malaise: Secondary | ICD-10-CM

## 2013-12-10 DIAGNOSIS — J449 Chronic obstructive pulmonary disease, unspecified: Secondary | ICD-10-CM

## 2013-12-10 MED ORDER — PREDNISONE 20 MG PO TABS: ORAL_TABLET | ORAL | Status: AC

## 2013-12-10 NOTE — Progress Notes (Signed)
CC: Margaret [name] is a 68 y.o. female is here for Shortness of Breath and Fatigue   Subjective: HPI:  Followup pneumonia: She received 7 full days of Rocephin 1 g daily and also finished 10 days of clindamycin. She reports she continues to have a productive cough brown green sputum without blood on a daily basis which is not getting better or worse overall. She does report she has good days and bad days but overall feels fatigued and not significantly improved since completing her antibiotic regimen. She's unsure about shortness of breath, she continues on Advair daily has not been using albuterol.  Denies wheezing. Denies fevers, chills, chest pain, abdominal pain, nausea, vomiting, nor myalgias.  She states fatigue is moderate to severe in severity not wanting to leave the bed during the daytime.  Review Of Systems Outlined In HPI  Past Medical History  Diagnosis Date  . Asthma   . Hypertension      Family History  Problem Relation Age of Onset  . Emphysema Mother   . Stroke Other      History  Substance Use Topics  . Smoking status: Former Smoker    Quit date: 02/01/2008  . Smokeless tobacco: Not on file  . Alcohol Use: Yes     Comment: SOCIALLY     Objective: Filed Vitals:   12/10/13 0956  BP: 129/93  Pulse: 91  Temp: 98 F (36.7 C)    General: Alert and Oriented, No Acute Distress appears mildly fatigued HEENT: Pupils equal, round, reactive to light. Conjunctivae clear.  External ears unremarkable, canals clear with intact TMs with appropriate landmarks.  Middle ear appears open without effusion. Pink inferior turbinates.  Moist mucous membranes, pharynx without inflammation nor lesions.  Neck supple without palpable lymphadenopathy nor abnormal masses. Lungs: Clear to auscultation bilaterally, no wheezing/ronchi/rales.  Comfortable work of breathing. Good air movement. Cardiac: Regular rate and rhythm. Normal S1/S2.  No murmurs, rubs, nor gallops.   Mental Status: No  depression, anxiety, nor agitation. Skin: Warm and dry.  Assessment & Plan: Margaret [name] was seen today for shortness of breath and fatigue.  Diagnoses and associated orders for this visit:  COPD, severity to be determined - predniSONE (DELTASONE) 20 MG tablet; Three tabs at once daily for five days.  Cough - DG Chest 2 View; Future - predniSONE (DELTASONE) 20 MG tablet; Three tabs at once daily for five days.    Given her cough and persistent fatigue a chest x-ray was obtained to rule out abscess or worsening pneumonia fortunately opacity is resolving provided reassurance to patient. Low suspicion of active infection start prednisone for coverage of COPD exacerbation that may have been present along with the active infection. Rest  Return if symptoms worsen or fail to improve.

## 2013-12-13 ENCOUNTER — Encounter: Payer: Self-pay | Admitting: Emergency Medicine

## 2013-12-13 ENCOUNTER — Emergency Department
Admission: EM | Admit: 2013-12-13 | Discharge: 2013-12-13 | Disposition: A | Discharge status: Home / Self Care | Payer: Medicare Other | Source: Home / Self Care | Attending: Family Medicine | Admitting: Family Medicine

## 2013-12-13 DIAGNOSIS — J9801 Acute bronchospasm: Secondary | ICD-10-CM

## 2013-12-13 NOTE — ED Provider Notes (Signed)
CSN: [id number]     Arrival date & time 12/13/13  1946 History   None    Chief Complaint  Patient presents with  . Shortness of Breath      HPI Comments: Patient was seen by her PCP 3 days ago for exacerbation of COPD; she is presently finishing a prednisone burst.  She continues Advair.  She states that she has increased wheezing and shortness of breath this evening, and reports that her albuterol inhaler was not effective.  She is requesting a nebulizer treatment (she does not have a home nebulizer).  The history is provided by the patient.    Past Medical History  Diagnosis Date  . Asthma   . Hypertension    Past Surgical History  Procedure Laterality Date  . Joint replacement      LT KNEE  . Abdominal hysterectomy    . Total knee arthroplasty  2006   Family History  Problem Relation Age of Onset  . Emphysema Mother   . Stroke Other    History  Substance Use Topics  . Smoking status: Former Smoker    Quit date: 02/01/2008  . Smokeless tobacco: Not on file  . Alcohol Use: Yes     Comment: SOCIALLY   OB History   Grav Para Term Preterm Abortions TAB SAB Ect Mult Living                 Review of Systems No sore throat + cough No pleuritic pain + wheezing + nasal congestion ? post-nasal drainage No sinus pain/pressure No itchy/red eyes No earache No hemoptysis + SOB No fever/chills No nausea No vomiting No abdominal pain No diarrhea No urinary symptoms No skin rash + fatigue No myalgias No headache    Allergies  Floxin; Floxin; Neosporin; Other; and Tetracyclines & related  Home Medications   Current Outpatient Rx  Name  Route  Sig  Dispense  Refill  . acyclovir (ZOVIRAX) 800 MG tablet   Oral   Take 1 tablet (800 mg total) by mouth 2 (two) times daily. Take a regimen of five days as soon as fever blisters occur.   30 tablet   1   . citalopram (CELEXA) 20 MG tablet   Oral   Take 1 tablet (20 mg total) by mouth daily.   90 tablet   1   .  Fluticasone-Salmeterol (ADVAIR DISKUS) 250-50 MCG/DOSE AEPB   Inhalation   Inhale 1 puff into the lungs every 12 (twelve) hours.         [name] lisinopril (PRINIVIL,ZESTRIL) 30 MG tablet   Oral   Take 1 tablet (30 mg total) by mouth daily.   30 tablet   4   . omeprazole (PRILOSEC) 20 MG capsule   Oral   Take 1 capsule (20 mg total) by mouth 2 (two) times daily.   180 capsule   3   . predniSONE (DELTASONE) 20 MG tablet      Three tabs at once daily for five days.   15 tablet   0   . raloxifene (EVISTA) 60 MG tablet   Oral   Take 1 tablet (60 mg total) by mouth daily.   30 tablet   6   . traMADol (ULTRAM) 50 MG tablet   Oral   Take 50 mg by mouth every 6 (six) hours as needed for pain.          BP 117/71  Pulse 87  Temp(Src) 97.8 F (36.6 C) (Oral)  Resp 18  Ht 5\' 2"  (1.575 m)  Wt 142 lb (64.411 kg)  BMI 25.97 kg/m2  SpO2 97% Physical Exam Nursing notes and Vital Signs reviewed. Appearance:  Patient appears  stated age, and in no acute distress Eyes:  Pupils are equal, round, and reactive to light and accomodation.  Extraocular movement is intact.  Conjunctivae are not inflamed  Ears:  Canals normal.  Tympanic membranes normal.  Nose:  Mildly congested turbinates.  No sinus tenderness.   Pharynx:  Normal; moist mucous membranes  Neck:  Supple.  No adenopathy Lungs:   Diffuse expiratory wheezes posteriorly.  No rales.  Breath sounds are equal.  No respiratory distress. Heart:  Regular rate and rhythm withou murmurs, rubs, or gallops.  Abdomen:  Nontender without masses or hepatosplenomegaly.  Bowel sounds are present.  No CVA or flank tenderness.  Extremities:  No edema.  No calf tenderness Skin:  No rash present.   ED Course  Procedures  none      MDM   1. Bronchospasm, acute    Administered DuoNeb treatment.  Post-neb, patient felt subjectively much better.  Decreased wheezing on chest exam.  SpO2 unchanged. Followup with PCP.  Consider home  nebulizer. If symptoms become significantly worse during the night or over the weekend, proceed to the local emergency room.     Kandra Nicolas, MD 12/15/13 [phone]

## 2013-12-13 NOTE — ED Notes (Signed)
SOB x 3 days

## 2013-12-13 NOTE — Discharge Instructions (Signed)
Continue present medications and inhalers. Take plain Mucinex (1200 mg guaifenesin) twice daily for cough and congestion.  Increase fluid intake, rest. If symptoms become significantly worse during the night or over the weekend, proceed to the local emergency room.    Bronchospasm, Adult A bronchospasm is a spasm or tightening of the airways going into the lungs. During a bronchospasm breathing becomes more difficult because the airways get smaller. When this happens there can be coughing, a whistling sound when breathing (wheezing), and difficulty breathing. Bronchospasm is often associated with asthma, but not all patients who experience a bronchospasm have asthma. CAUSES  A bronchospasm is caused by inflammation or irritation of the airways. The inflammation or irritation may be triggered by:   Allergies (such as to animals, pollen, food, or mold). Allergens that cause bronchospasm may cause wheezing immediately after exposure or many hours later.   Infection. Viral infections are believed to be the most common cause of bronchospasm.   Exercise.   Irritants (such as pollution, cigarette smoke, strong odors, aerosol sprays, and paint fumes).   Weather changes. Winds increase molds and pollens in the air. Rain refreshes the air by washing irritants out. Cold air may cause inflammation.   Stress and emotional upset.  SIGNS AND SYMPTOMS   Wheezing.   Excessive nighttime coughing.   Frequent or severe coughing with a simple cold.   Chest tightness.   Shortness of breath.  DIAGNOSIS  Bronchospasm is usually diagnosed through a history and physical exam. Tests, such as chest X-rays, are sometimes done to look for other conditions. TREATMENT   Inhaled medicines can be given to open up your airways and help you breathe. The medicines can be given using either an inhaler or a nebulizer machine.  Corticosteroid medicines may be given for severe bronchospasm, usually when it  is associated with asthma. HOME CARE INSTRUCTIONS   Always have a plan prepared for seeking medical care. Know when to call your health care provider and local emergency services (911 in the [place] Know where you can access local emergency care.  Only take medicines as directed by your health care provider.  If you were prescribed an inhaler or nebulizer machine, ask your health care provider to explain how to use it correctly. Always use a spacer with your inhaler if you were given one.  It is necessary to remain calm during an attack. Try to relax and breathe more slowly.  Control your home environment in the following ways:   Change your heating and air conditioning filter at least once a month.   Limit your use of fireplaces and wood stoves.  Do not smoke and do not allow smoking in your home.   Avoid exposure to perfumes and fragrances.   Get rid of pests (such as roaches and mice) and their droppings.   Throw away plants if you see mold on them.   Keep your house clean and dust free.   Replace carpet with wood, tile, or vinyl flooring. Carpet can trap dander and dust.   Use allergy-proof pillows, mattress covers, and box spring covers.   Wash bed sheets and blankets every week in hot water and dry them in a dryer.   Use blankets that are made of polyester or cotton.   Wash hands frequently. SEEK MEDICAL CARE IF:   You have muscle aches.   You have chest pain.   The sputum changes from clear or white to yellow, green, gray, or bloody.   The sputum  you cough up gets thicker.   There are problems that may be related to the medicine you are given, such as a rash, itching, swelling, or trouble breathing.  SEEK IMMEDIATE MEDICAL CARE IF:   You have worsening wheezing and coughing even after taking your prescribed medicines.   You have increased difficulty breathing.   You develop severe chest pain. MAKE SURE YOU:   Understand these  instructions.  Will watch your condition.  Will get help right away if you are not doing well or get worse. Document Released: 11/24/2003 Document Revised: 07/24/2013 Document Reviewed: 05/13/2013 [facility] Patient Information 2014 [place].

## 2013-12-17 ENCOUNTER — Encounter: Payer: Self-pay | Admitting: Family Medicine

## 2013-12-17 DIAGNOSIS — J45909 Unspecified asthma, uncomplicated: Secondary | ICD-10-CM | POA: Insufficient documentation

## 2013-12-23 ENCOUNTER — Ambulatory Visit: Payer: Self-pay

## 2013-12-30 ENCOUNTER — Encounter: Payer: Self-pay | Admitting: Family Medicine

## 2014-01-03 ENCOUNTER — Ambulatory Visit (INDEPENDENT_AMBULATORY_CARE_PROVIDER_SITE_OTHER): Payer: Medicare Other | Admitting: Family Medicine

## 2014-01-03 ENCOUNTER — Encounter: Payer: Self-pay | Admitting: Family Medicine

## 2014-01-03 ENCOUNTER — Ambulatory Visit (INDEPENDENT_AMBULATORY_CARE_PROVIDER_SITE_OTHER): Payer: Medicare Other

## 2014-01-03 VITALS — BP 143/97 | HR 94 | Temp 99.9°F | Wt 141.0 lb

## 2014-01-03 DIAGNOSIS — R509 Fever, unspecified: Secondary | ICD-10-CM

## 2014-01-03 DIAGNOSIS — J45909 Unspecified asthma, uncomplicated: Secondary | ICD-10-CM

## 2014-01-03 DIAGNOSIS — T300 Burn of unspecified body region, unspecified degree: Secondary | ICD-10-CM

## 2014-01-03 DIAGNOSIS — R05 Cough: Secondary | ICD-10-CM

## 2014-01-03 DIAGNOSIS — R059 Cough, unspecified: Secondary | ICD-10-CM

## 2014-01-03 DIAGNOSIS — J189 Pneumonia, unspecified organism: Secondary | ICD-10-CM

## 2014-01-03 MED ORDER — AMOXICILLIN-POT CLAVULANATE ER 1000-62.5 MG PO TB12: 2.0000 | ORAL_TABLET | Freq: Two times a day (BID) | ORAL | Status: DC

## 2014-01-03 MED ORDER — SILVER SULFADIAZINE 1 % EX CREA: 1.0000 "application " | TOPICAL_CREAM | Freq: Every day | CUTANEOUS | Status: DC

## 2014-01-03 MED ORDER — AZITHROMYCIN 250 MG PO TABS: ORAL_TABLET | ORAL | Status: AC

## 2014-01-03 MED ORDER — OSELTAMIVIR PHOSPHATE 75 MG PO CAPS: 75.0000 mg | ORAL_CAPSULE | Freq: Two times a day (BID) | ORAL | Status: DC

## 2014-01-03 NOTE — Progress Notes (Signed)
CC: Margaret [name] is a 68 y.o. female is here for Fever   Subjective: HPI:  Complains of a fever 102.0 that came on unexpectedly this morning no intervention as of yet. Was feeling in her regular state of health until awakening this morning. Since then she has been feeling moderate fatigue with mild shortness of breath. Accompanied by diffuse mild to moderate myalgias. Denies cough, chest pain, confusion, motor or sensory disturbances. No known sick contacts. Overall symptoms have been persistent since onset nothing is particularly making it better or worse and there have been no interventions as of yet  She also points out that she has burned her left hand on Monday when accidentally reflexively reached out to grab a hot frying pan that was about to fall off the stove. She has been cleaning the wound with soap daily. It is mildly painful described as only as pain.  She reports that it is seeping a clear fluid throughout the day but not bleeding or showing signs of pus.   Review Of Systems Outlined In HPI  Past Medical History  Diagnosis Date  . Asthma   . Hypertension      Family History  Problem Relation Age of Onset  . Emphysema Mother   . Stroke Other      History  Substance Use Topics  . Smoking status: Former Smoker    Quit date: 02/01/2008  . Smokeless tobacco: Not on file  . Alcohol Use: Yes     Comment: SOCIALLY     Objective: Filed Vitals:   01/03/14 0910  BP: 143/97  Pulse: 94  Temp: 99.9 F (37.7 C)    General: Alert and Oriented, No Acute Distress, appears mildly fatigued HEENT: Pupils equal, round, reactive to light. Conjunctivae clear.  External ears unremarkable, canals clear with intact TMs with appropriate landmarks.  Middle ear appears open without effusion. Pink inferior turbinates.  Moist mucous membranes, pharynx without inflammation nor lesions.  Neck supple without palpable lymphadenopathy nor abnormal masses. Lungs: Distant breath sounds in all  lung fields without rhonchi Rales nor wheezing Cardiac: Regular rate and rhythm. Normal S1/S2.  No murmurs, rubs, nor gallops.   Mental Status: No depression, anxiety, nor agitation. Skin: Warm and dry. There is a 2 cm diameter superficial burn in the webspace of the thumb and palm on the left hand without any sign of infection.  Assessment & Plan: Franca was seen today for fever.  Diagnoses and associated orders for this visit:  Fever, unspecified - POCT Influenza A/B - oseltamivir (TAMIFLU) 75 MG capsule; Take 1 capsule (75 mg total) by mouth 2 (two) times daily. - DG Chest 2 View; Future  Burn - silver sulfADIAZINE (SILVADENE) 1 % cream; Apply 1 application topically daily. Until redness resolved.  CAP (community acquired pneumonia) - azithromycin (ZITHROMAX) 250 MG tablet; Take two tabs at once on day 1, then one tab daily on days 2-5. - amoxicillin-clavulanate (AUGMENTIN XR) 1000-62.5 MG per tablet; Take 2 tablets by mouth 2 (two) times daily.    Fever: Rapid flu was negative however clinical suspicion high enough that I would like her to start on Tamiflu. We obtained a chest x-ray prior to her leaving to rule out pneumonia. Unfortunately there is evidence of pneumonia on her film therefore started her on high-dose Augmentin and azithromycin. I've encouraged her to followup with her pulmonologist as soon as possible given the oddity of her frequent pneumonias. Burn: Devitalized epidermis was removed with scissors, silver sulfadiazine was applied to  the burn site and covered in gauze. Encouraged her should repeat this on a daily basis keeping the wound clean.   Return if symptoms worsen or fail to improve.

## 2014-01-08 ENCOUNTER — Encounter: Payer: Self-pay | Admitting: Family Medicine

## 2014-02-07 ENCOUNTER — Encounter: Payer: Self-pay | Admitting: Family Medicine

## 2014-02-26 ENCOUNTER — Other Ambulatory Visit: Payer: Self-pay | Admitting: Family Medicine

## 2014-02-26 ENCOUNTER — Encounter: Payer: Self-pay | Admitting: Family Medicine

## 2014-03-05 ENCOUNTER — Encounter: Payer: Self-pay | Admitting: Family Medicine

## 2014-03-05 ENCOUNTER — Ambulatory Visit (INDEPENDENT_AMBULATORY_CARE_PROVIDER_SITE_OTHER): Payer: Medicare Other | Admitting: Family Medicine

## 2014-03-05 VITALS — BP 134/92 | HR 64 | Temp 98.0°F | Wt 136.0 lb

## 2014-03-05 DIAGNOSIS — B9689 Other specified bacterial agents as the cause of diseases classified elsewhere: Secondary | ICD-10-CM

## 2014-03-05 DIAGNOSIS — R232 Flushing: Secondary | ICD-10-CM | POA: Insufficient documentation

## 2014-03-05 DIAGNOSIS — A499 Bacterial infection, unspecified: Secondary | ICD-10-CM

## 2014-03-05 DIAGNOSIS — J329 Chronic sinusitis, unspecified: Secondary | ICD-10-CM

## 2014-03-05 MED ORDER — CEFDINIR 300 MG PO CAPS: 300.0000 mg | ORAL_CAPSULE | Freq: Two times a day (BID) | ORAL | Status: AC

## 2014-03-05 NOTE — Progress Notes (Signed)
CC: Margaret [name] is a 68 y.o. female is here for Sinusitis   Subjective: HPI:  Complains of facial pressure and thick nasal discharge with subjective postnasal drip that is overall moderate in severity present for the last week worsening a daily basis. Has tried Alka-Seltzer cold medicine but no improvement. Accompanied by fatigue. Facial pressures localized beneath and above both eyes. Nothing particularly makes symptoms above better or worse.  Denies fevers, chills, sore throat, cough, wheezing, shortness of breath, chest pain nor new thoracic pain.  She complains of hot flashes that are described as a flushing and warmth sensation that occurs multiple times during the day it interferes with her quality of life. It has been present for over a year on a daily basis but she has forgot to mention it on prior encounters. She reports symptoms are moderate to severe in severity. Nothing particularly makes better or worse. They have been occurring ever since she went through menopause years ago. They can occur anytime of the day.  She wants to know if there is medication to help control this    Review Of Systems Outlined In HPI  Past Medical History  Diagnosis Date  . Asthma   . Hypertension     Past Surgical History  Procedure Laterality Date  . Joint replacement      LT KNEE  . Abdominal hysterectomy    . Total knee arthroplasty  2006   Family History  Problem Relation Age of Onset  . Emphysema Mother   . Stroke Other     History   Social History  . Marital Status: Married    Spouse Name: N/A    Number of Children: N/A  . Years of Education: N/A   Occupational History  . Not on file.   Social History Main Topics  . Smoking status: Former Smoker    Quit date: 02/01/2008  . Smokeless tobacco: Not on file  . Alcohol Use: Yes     Comment: SOCIALLY  . Drug Use: No  . Sexual Activity: Yes   Other Topics Concern  . Not on file   Social History Narrative   ** Merged  History Encounter **         Objective: BP 134/92  Pulse 64  Temp(Src) 98 F (36.7 C) (Oral)  Wt 136 lb (61.689 kg)  General: Alert and Oriented, No Acute Distress HEENT: Pupils equal, round, reactive to light. Conjunctivae clear.  External ears unremarkable, canals clear with intact TMs with appropriate landmarks.  Middle ear appears open without effusion. Boggy erythematous and her turbinates with mild mucoid discharge.  Moist mucous membranes, pharynx without inflammation nor lesions.  Neck supple without palpable lymphadenopathy nor abnormal masses however moderate cobblestoning. Lungs: Clear to auscultation bilaterally, no wheezing/ronchi/rales.  Comfortable work of breathing. Good air movement. Extremities: No peripheral edema.  Strong peripheral pulses.  Mental Status: No depression, anxiety, nor agitation. Skin: Warm and dry.  Assessment & Plan: Margaret [name] was seen today for sinusitis.  Diagnoses and associated orders for this visit:  Bacterial sinusitis - cefdinir (OMNICEF) 300 MG capsule; Take 1 capsule (300 mg total) by mouth 2 (two) times daily.  Vasomotor flushing    Bacterial sinusitis: She was recently on amoxicillin therefore start Howell instead.  Consider nasal saline washes to help with symptoms Vasomotor flushing: Uncontrolled chronic condition, we discussed trying to separate SSRIs other than citalopram, she states her sister had complete resolution of hot flashes when she started Prozac. Encouraged patient to wait until  she recovers from back surgery and 2 we taper down citalopram and start Prozac. Contact me by phone or via my chart after she has recovered from surgery   Return in about 3 months (around 06/04/2014).

## 2014-03-12 ENCOUNTER — Other Ambulatory Visit: Payer: Self-pay | Admitting: *Deleted

## 2014-03-12 DIAGNOSIS — I10 Essential (primary) hypertension: Secondary | ICD-10-CM

## 2014-03-12 DIAGNOSIS — M4316 Spondylolisthesis, lumbar region: Secondary | ICD-10-CM | POA: Insufficient documentation

## 2014-03-12 MED ORDER — LISINOPRIL 30 MG PO TABS: 30.0000 mg | ORAL_TABLET | Freq: Every day | ORAL | Status: DC

## 2014-05-06 ENCOUNTER — Ambulatory Visit (INDEPENDENT_AMBULATORY_CARE_PROVIDER_SITE_OTHER): Payer: Medicare Other | Admitting: Family Medicine

## 2014-05-06 ENCOUNTER — Encounter: Payer: Self-pay | Admitting: Family Medicine

## 2014-05-06 VITALS — BP 124/86 | HR 99 | Wt 124.0 lb

## 2014-05-06 DIAGNOSIS — F411 Generalized anxiety disorder: Secondary | ICD-10-CM

## 2014-05-06 DIAGNOSIS — M810 Age-related osteoporosis without current pathological fracture: Secondary | ICD-10-CM

## 2014-05-06 DIAGNOSIS — I10 Essential (primary) hypertension: Secondary | ICD-10-CM

## 2014-05-06 DIAGNOSIS — Z1239 Encounter for other screening for malignant neoplasm of breast: Secondary | ICD-10-CM

## 2014-05-06 MED ORDER — FLUOXETINE HCL 10 MG PO TABS: 10.0000 mg | ORAL_TABLET | Freq: Every day | ORAL | Status: DC

## 2014-05-06 NOTE — Progress Notes (Signed)
CC: Margaret [name] is a 68 y.o. female is here for depression/ anxiety f/u   Subjective: HPI:  Followup essential hypertension: Continues on lisinopril on a daily basis with no outside blood pressures to report. Denies cough, angioedema, chest pain, shortness of breath nor orthopnea.  Followup osteoporosis: She tells me that when getting her spine surgery her orthopedist question the idea of her having osteoporosis based on the appearance and morphology of her lumbar vertebrae. She's wondering if she still needs to be on Evista.  Followup generalized anxiety disorder: She has decided to stop citalopram, she denies any intolerance of wanting to know if another medication would be more beneficial for anxiety and hot flashes. She has been off of this medication for 2 days with mild worsening of generalized anxiety the site has irritability which is worsened around her dogs and improves when she removed herself from the presence of her dogs. Nothing else makes better or worse. Denies depression or any other mental disturbance  Review Of Systems Outlined In HPI  Past Medical History  Diagnosis Date  . Asthma   . Hypertension     Past Surgical History  Procedure Laterality Date  . Joint replacement      LT KNEE  . Abdominal hysterectomy    . Total knee arthroplasty  2006   Family History  Problem Relation Age of Onset  . Emphysema Mother   . Stroke Other     History   Social History  . Marital Status: Married    Spouse Name: N/A    Number of Children: N/A  . Years of Education: N/A   Occupational History  . Not on file.   Social History Main Topics  . Smoking status: Former Smoker    Quit date: 02/01/2008  . Smokeless tobacco: Not on file  . Alcohol Use: Yes     Comment: SOCIALLY  . Drug Use: No  . Sexual Activity: Yes   Other Topics Concern  . Not on file   Social History Narrative   ** Merged History Encounter **         Objective: BP 124/86  Pulse 99  Wt 124  lb (56.246 kg)  General: Alert and Oriented, No Acute Distress HEENT: Pupils equal, round, reactive to light. Conjunctivae clear. Moist mucous membranes nares unremarkable Lungs: Clear to auscultation bilaterally, no wheezing/ronchi/rales.  Comfortable work of breathing. Good air movement. Cardiac: Regular rate and rhythm. Normal S1/S2.  No murmurs, rubs, nor gallops.   Extremities: No peripheral edema.  Strong peripheral pulses.  Mental Status: No depression, anxiety, nor agitation. Skin: Warm and dry.  Assessment & Plan: Jayra was seen today for depression/ anxiety f/u.  Diagnoses and associated orders for this visit:  Essential hypertension, benign  Osteoporosis - DG Bone Density; Future  Screening for breast cancer - MM DIGITAL SCREENING BILATERAL; Future  Generalized anxiety disorder - FLUoxetine (PROZAC) 10 MG tablet; Take 1 tablet (10 mg total) by mouth daily.    Essential hypertension: Controlled continue lisinopril Osteoporosis: She is due for a repeat DEXA scan to determine if she should continue on this to Generalized anxiety disorder: Discussed the benefits of being on SSRI like Prozac which would help with anxiety and hot flashes, she would like to try low dose   Return in about 4 weeks (around 06/03/2014).

## 2014-05-11 ENCOUNTER — Other Ambulatory Visit: Payer: Self-pay | Admitting: Family Medicine

## 2014-05-22 ENCOUNTER — Other Ambulatory Visit: Payer: Self-pay | Admitting: Family Medicine

## 2014-06-17 ENCOUNTER — Ambulatory Visit (INDEPENDENT_AMBULATORY_CARE_PROVIDER_SITE_OTHER): Payer: Medicare Other

## 2014-06-17 ENCOUNTER — Other Ambulatory Visit: Payer: Self-pay | Admitting: Neurological Surgery

## 2014-06-17 DIAGNOSIS — Z1239 Encounter for other screening for malignant neoplasm of breast: Secondary | ICD-10-CM

## 2014-06-17 DIAGNOSIS — Z1231 Encounter for screening mammogram for malignant neoplasm of breast: Secondary | ICD-10-CM

## 2014-06-17 DIAGNOSIS — Z981 Arthrodesis status: Secondary | ICD-10-CM

## 2014-06-17 DIAGNOSIS — M5137 Other intervertebral disc degeneration, lumbosacral region: Secondary | ICD-10-CM

## 2014-06-17 DIAGNOSIS — M949 Disorder of cartilage, unspecified: Secondary | ICD-10-CM

## 2014-06-17 DIAGNOSIS — M810 Age-related osteoporosis without current pathological fracture: Secondary | ICD-10-CM

## 2014-06-17 DIAGNOSIS — M899 Disorder of bone, unspecified: Secondary | ICD-10-CM

## 2014-06-22 ENCOUNTER — Telehealth: Payer: Self-pay | Admitting: Family Medicine

## 2014-06-22 DIAGNOSIS — M810 Age-related osteoporosis without current pathological fracture: Secondary | ICD-10-CM

## 2014-06-22 NOTE — Telephone Encounter (Signed)
Margaret [name], Will you please let patient know that her bone density test confirmed low bone density in the hips however it had improved since 2013 which reflects that Evista was providing some benefit.  With this result I'd encourage her to continue on Evista unless she experiences any intolerance. (If she needs a refill I'd authorize a years worth.)

## 2014-06-24 NOTE — Telephone Encounter (Signed)
Left message on vm with results  

## 2014-07-21 ENCOUNTER — Other Ambulatory Visit: Payer: Self-pay | Admitting: Family Medicine

## 2014-07-23 ENCOUNTER — Ambulatory Visit (INDEPENDENT_AMBULATORY_CARE_PROVIDER_SITE_OTHER): Payer: Medicare Other | Admitting: Family Medicine

## 2014-07-23 ENCOUNTER — Encounter: Payer: Self-pay | Admitting: Family Medicine

## 2014-07-23 VITALS — BP 109/77 | HR 80 | Wt 124.0 lb

## 2014-07-23 DIAGNOSIS — F411 Generalized anxiety disorder: Secondary | ICD-10-CM

## 2014-07-23 DIAGNOSIS — I10 Essential (primary) hypertension: Secondary | ICD-10-CM

## 2014-07-23 DIAGNOSIS — L659 Nonscarring hair loss, unspecified: Secondary | ICD-10-CM

## 2014-07-23 MED ORDER — FLUOXETINE HCL 10 MG PO TABS: 10.0000 mg | ORAL_TABLET | Freq: Every day | ORAL | Status: DC

## 2014-07-23 MED ORDER — LISINOPRIL 30 MG PO TABS: ORAL_TABLET | ORAL | Status: DC

## 2014-07-23 NOTE — Progress Notes (Signed)
CC: Margaret [name] is a 68 y.o. female is here for f/u prozac   Subjective: HPI:  Followup essential hypertension: No outside blood pressures to report continues on lisinopril and a daily basis without noted side effects. No chest pain shortness of breath orthopnea nor peripheral edema  Followup anxiety: She states that Prozac is much more tolerable than citalopram. She believes it's wonderful helping with blunting her subjective anxiety to stressful situations. She also likes that she can still experience emotions such as crying during a good movie or recognizing pleasurable activities. Denies depression or any mental disturbance. No thoughts of wanting to harm self or others  Complains of hair loss that has been noted for the last 2-3 weeks. Described as diffuse, moderate, only localized on the scalp. She's tried taking biotin on a daily basis which helped in the past but no help recently. History is significant for anesthesia in April. He denies unintentional weight loss or gain nor hot or cold intolerance   Review Of Systems Outlined In HPI  Past Medical History  Diagnosis Date  . Asthma   . Hypertension     Past Surgical History  Procedure Laterality Date  . Joint replacement      LT KNEE  . Abdominal hysterectomy    . Total knee arthroplasty  2006   Family History  Problem Relation Age of Onset  . Emphysema Mother   . Stroke Other     History   Social History  . Marital Status: Married    Spouse Name: N/A    Number of Children: N/A  . Years of Education: N/A   Occupational History  . Not on file.   Social History Main Topics  . Smoking status: Former Smoker    Quit date: 02/01/2008  . Smokeless tobacco: Not on file  . Alcohol Use: Yes     Comment: SOCIALLY  . Drug Use: No  . Sexual Activity: Yes   Other Topics Concern  . Not on file   Social History Narrative   ** Merged History Encounter **         Objective: BP 109/77  Pulse 80  Wt 124 lb (56.246  kg)  General: Alert and Oriented, No Acute Distress HEENT: Pupils equal, round, reactive to light. Conjunctivae clear.  Moist mucous membranes pharynx unremarkable. Scalp has mild diffuse hair loss without any other scalp changes nor any focal areas of hair loss Lungs: Clear to auscultation bilaterally, no wheezing/ronchi/rales.  Comfortable work of breathing. Good air movement. Cardiac: Regular rate and rhythm. Normal S1/S2.  No murmurs, rubs, nor gallops.  . Extremities: No peripheral edema.  Strong peripheral pulses.  Mental Status: No depression, anxiety, nor agitation. Skin: Warm and dry.  Assessment & Plan: [name] was seen today for f/u prozac.  Diagnoses and associated orders for this visit:  Essential hypertension, benign - lisinopril (PRINIVIL,ZESTRIL) 30 MG tablet; TAKE 1 TABLET (30 MG TOTAL) BY MOUTH DAILY.  Generalized anxiety disorder - FLUoxetine (PROZAC) 10 MG tablet; Take 1 tablet (10 mg total) by mouth daily.  Hair loss - TSH - COMPLETE METABOLIC PANEL WITH GFR    Essential hypertension: Controlled continue lisinopril Anxiety: Controlled continue Prozac Hair loss: suspect telogen effluvium from her back surgery in April but will need to rule out endocrine and metabolic causes.   Return if symptoms worsen or fail to improve.

## 2014-07-24 ENCOUNTER — Telehealth: Payer: Self-pay | Admitting: Family Medicine

## 2014-07-24 DIAGNOSIS — E039 Hypothyroidism, unspecified: Secondary | ICD-10-CM | POA: Insufficient documentation

## 2014-07-24 LAB — COMPLETE METABOLIC PANEL WITH GFR
ALBUMIN: 4.2 g/dL (ref 3.5–5.2)
ALT: 13 U/L (ref 0–35)
AST: 18 U/L (ref 0–37)
Alkaline Phosphatase: 55 U/L (ref 39–117)
BUN: 16 mg/dL (ref 6–23)
CO2: 27 meq/L (ref 19–32)
Calcium: 8.9 mg/dL (ref 8.4–10.5)
Chloride: 104 mEq/L (ref 96–112)
Creat: 0.85 mg/dL (ref 0.50–1.10)
GFR, EST NON AFRICAN AMERICAN: 71 mL/min
GFR, Est African American: 82 mL/min
GLUCOSE: 148 mg/dL — AB (ref 70–99)
Potassium: 4.2 mEq/L (ref 3.5–5.3)
SODIUM: 140 meq/L (ref 135–145)
TOTAL PROTEIN: 6.4 g/dL (ref 6.0–8.3)
Total Bilirubin: 0.4 mg/dL (ref 0.2–1.2)

## 2014-07-24 LAB — TSH: TSH: 4.736 u[IU]/mL — ABNORMAL HIGH (ref 0.350–4.500)

## 2014-07-24 NOTE — Telephone Encounter (Signed)
Margaret [name], Will you please let patient know that her thyroid function was below normal limits yesterday, I'd recommend she have a confirmatory test to see if she needs to start thyroid hormone replacement therapy.  This could be the cause of her hair loss.  (labs in your inbox)

## 2014-07-24 NOTE — Telephone Encounter (Signed)
Pt notified and labs faxed

## 2014-07-25 LAB — T3, FREE: T3, Free: 2.7 pg/mL (ref 2.3–4.2)

## 2014-07-25 LAB — TSH: TSH: 3.384 u[IU]/mL (ref 0.350–4.500)

## 2014-07-25 LAB — T4, FREE: Free T4: 1 ng/dL (ref 0.80–1.80)

## 2014-07-30 ENCOUNTER — Telehealth: Payer: Self-pay | Admitting: *Deleted

## 2014-07-30 NOTE — Telephone Encounter (Signed)
Pt called and states she feels tired and no energy. She wants additional blood work ran.  I called her and re advised her that her confirmatory thyroid test was nml. Advised to schedule appt to discuss fatigue and "feeling horrible"the patient agreed and voiced understanding

## 2014-08-12 ENCOUNTER — Ambulatory Visit: Payer: Medicare Other | Admitting: Family Medicine

## 2014-08-19 ENCOUNTER — Encounter: Payer: Self-pay | Admitting: Family Medicine

## 2014-08-21 ENCOUNTER — Encounter: Payer: Self-pay | Admitting: Sports Medicine

## 2014-08-21 ENCOUNTER — Telehealth: Payer: Self-pay | Admitting: Family Medicine

## 2014-08-21 ENCOUNTER — Encounter: Payer: Self-pay | Admitting: Family Medicine

## 2014-08-21 ENCOUNTER — Other Ambulatory Visit: Payer: Self-pay | Admitting: Family Medicine

## 2014-08-21 ENCOUNTER — Ambulatory Visit (INDEPENDENT_AMBULATORY_CARE_PROVIDER_SITE_OTHER): Payer: Medicare Other | Admitting: Sports Medicine

## 2014-08-21 VITALS — BP 120/79 | HR 72 | Ht 60.0 in | Wt 128.0 lb

## 2014-08-21 DIAGNOSIS — M1611 Unilateral primary osteoarthritis, right hip: Secondary | ICD-10-CM | POA: Insufficient documentation

## 2014-08-21 DIAGNOSIS — L659 Nonscarring hair loss, unspecified: Secondary | ICD-10-CM | POA: Insufficient documentation

## 2014-08-21 DIAGNOSIS — M76899 Other specified enthesopathies of unspecified lower limb, excluding foot: Secondary | ICD-10-CM

## 2014-08-21 DIAGNOSIS — L658 Other specified nonscarring hair loss: Secondary | ICD-10-CM | POA: Insufficient documentation

## 2014-08-21 DIAGNOSIS — M7061 Trochanteric bursitis, right hip: Secondary | ICD-10-CM

## 2014-08-21 DIAGNOSIS — M25551 Pain in right hip: Secondary | ICD-10-CM | POA: Insufficient documentation

## 2014-08-21 NOTE — Progress Notes (Signed)
   Subjective:    I'm seeing this patient as a consultation for:  Dr. Ileene Rubens  CC: Right hip pain  HPI: This is a pleasant 68 year old female with a history of trochanteric bursitis, she has had an injection in the distant past. She now has a recurrence of pain over the greater trochanter and the distal gluteus medius tendon, moderate, persistent without radiation. Difficult to sleep on the ipsilateral side due to pain.  Past medical history, Surgical history, Family history not pertinant except as noted below, Social history, Allergies, and medications have been entered into the medical record, reviewed, and no changes needed.   Review of Systems: No headache, visual changes, nausea, vomiting, diarrhea, constipation, dizziness, abdominal pain, skin rash, fevers, chills, night sweats, weight loss, swollen lymph nodes, body aches, joint swelling, muscle aches, chest pain, shortness of breath, mood changes, visual or auditory hallucinations.   Objective:   General: Well Developed, well nourished, and in no acute distress.  Neuro/Psych: Alert and oriented x3, extra-ocular muscles intact, able to move all 4 extremities, sensation grossly intact. Skin: Warm and dry, no rashes noted.  Respiratory: Not using accessory muscles, speaking in full sentences, trachea midline.  Cardiovascular: Pulses palpable, no extremity edema. Abdomen: Does not appear distended. Right Hip: ROM IR: 60 Deg, ER: 60 Deg, Flexion: 120 Deg, Extension: 100 Deg, Abduction: 45 Deg, Adduction: 45 Deg Strength IR: 5/5, ER: 5/5, Flexion: 5/5, Extension: 5/5, Abduction: 5/5, Adduction: 5/5 hip abductor's are weaker on the right than the left. Pelvic alignment unremarkable to inspection and palpation. Standing hip rotation and gait without trendelenburg / unsteadiness. Greater trochanter with tenderness to palpation. No tenderness over piriformis. No SI joint tenderness and normal minimal SI movement.  Procedure:  Injection  of right trochanteric bursa Consent obtained and verified. Time-out conducted. Noted no overlying erythema, induration, or other signs of local infection. Skin prepped in a sterile fashion. Topical analgesic spray: Ethyl chloride. Completed without difficulty. Meds: Spinal needle advanced to the greater trochanter, 1 cc Kenalog 40, 4 cc lidocaine injected in a fanlike pattern. Pain immediately improved suggesting accurate placement of the medication. Advised to call if fevers/chills, erythema, induration, drainage, or persistent bleeding.  Impression and Recommendations:   This case required medical decision making of moderate complexity.

## 2014-08-21 NOTE — Telephone Encounter (Signed)
Margaret [name], Can you please [facility] the entire packet in the scan folder?

## 2014-08-21 NOTE — Assessment & Plan Note (Signed)
Trochanteric bursa injection as above, she does have marketed abductor weakness which improved after injection. She does need to work on her abductor strength with formal physical therapy. Return in one month.

## 2014-08-22 NOTE — Telephone Encounter (Signed)
Faxed and scanned

## 2014-09-01 ENCOUNTER — Other Ambulatory Visit: Payer: Self-pay | Admitting: Family Medicine

## 2014-09-01 ENCOUNTER — Encounter: Payer: Self-pay | Admitting: Family Medicine

## 2014-09-01 DIAGNOSIS — I10 Essential (primary) hypertension: Secondary | ICD-10-CM

## 2014-09-04 MED ORDER — OLMESARTAN MEDOXOMIL 20 MG PO TABS: 20.0000 mg | ORAL_TABLET | Freq: Every day | ORAL | Status: DC

## 2014-09-18 ENCOUNTER — Ambulatory Visit: Payer: Medicare Other | Admitting: Sports Medicine

## 2014-09-29 ENCOUNTER — Encounter: Payer: Self-pay | Admitting: Family Medicine

## 2014-10-26 ENCOUNTER — Other Ambulatory Visit: Payer: Self-pay | Admitting: Family Medicine

## 2014-11-04 ENCOUNTER — Encounter: Payer: Self-pay | Admitting: Sports Medicine

## 2014-11-04 ENCOUNTER — Ambulatory Visit (INDEPENDENT_AMBULATORY_CARE_PROVIDER_SITE_OTHER): Payer: Medicare Other | Admitting: Sports Medicine

## 2014-11-04 ENCOUNTER — Ambulatory Visit (INDEPENDENT_AMBULATORY_CARE_PROVIDER_SITE_OTHER): Payer: Medicare Other

## 2014-11-04 VITALS — BP 127/76 | HR 71 | Ht 61.0 in | Wt 131.0 lb

## 2014-11-04 DIAGNOSIS — M25461 Effusion, right knee: Secondary | ICD-10-CM

## 2014-11-04 DIAGNOSIS — Z96652 Presence of left artificial knee joint: Secondary | ICD-10-CM

## 2014-11-04 DIAGNOSIS — M1711 Unilateral primary osteoarthritis, right knee: Secondary | ICD-10-CM

## 2014-11-04 DIAGNOSIS — M7061 Trochanteric bursitis, right hip: Secondary | ICD-10-CM

## 2014-11-04 DIAGNOSIS — M25562 Pain in left knee: Secondary | ICD-10-CM

## 2014-11-04 DIAGNOSIS — Z23 Encounter for immunization: Secondary | ICD-10-CM

## 2014-11-04 DIAGNOSIS — Z96651 Presence of right artificial knee joint: Secondary | ICD-10-CM

## 2014-11-04 NOTE — Assessment & Plan Note (Signed)
Aspiration and injection as above. X-rays.  Return in one month, consider viscous supplementation if no better.

## 2014-11-04 NOTE — Progress Notes (Signed)
  Subjective:    CC: Right knee pain  HPI: Post torn anterior cruciate ligament and reconstruction decades ago, now with pain that she localizes along the medial joint line with significant swelling and effusion. No mechanical pain, pain is moderate, persistent.  Past medical history, Surgical history, Family history not pertinant except as noted below, Social history, Allergies, and medications have been entered into the medical record, reviewed, and no changes needed.   Review of Systems: No fevers, chills, night sweats, weight loss, chest pain, or shortness of breath.   Objective:    General: Well Developed, well nourished, and in no acute distress.  Neuro: Alert and oriented x3, extra-ocular muscles intact, sensation grossly intact.  HEENT: Normocephalic, atraumatic, pupils equal round reactive to light, neck supple, no masses, no lymphadenopathy, thyroid nonpalpable.  Skin: Warm and dry, no rashes. Cardiac: Regular rate and rhythm, no murmurs rubs or gallops, no lower extremity edema.  Respiratory: Clear to auscultation bilaterally. Not using accessory muscles, speaking in full sentences. Right Knee: Visible and palpable effusion with fluid wave, and tenderness at the medial joint line. ROM normal in flexion and extension and lower leg rotation. Ligaments with solid consistent endpoints including ACL, PCL, LCL, MCL. Negative Mcmurray's and provocative meniscal tests. Non painful patellar compression. Patellar and quadriceps tendons unremarkable. Hamstring and quadriceps strength is normal.  Procedure: Real-time Ultrasound Guided aspiration/Injection of right knee Device: GE Logiq E  Verbal informed consent obtained.  Time-out conducted.  Noted no overlying erythema, induration, or other signs of local infection.  Skin prepped in a sterile fashion.  Local anesthesia: Topical Ethyl chloride.  With sterile technique and under real time ultrasound guidance:  18-gauge needle  advanced into the suprapatellar recess, a total of 12 mL of straw-colored fluid was aspirated, syringe switched and 2 mL Kenalog 40, 4 mL lidocaine injected easily. Completed without difficulty  Pain immediately resolved suggesting accurate placement of the medication.  Advised to call if fevers/chills, erythema, induration, drainage, or persistent bleeding.  Images permanently stored and available for review in the ultrasound unit.  Impression: Technically successful ultrasound guided injection.  Impression and Recommendations:

## 2014-11-04 NOTE — Assessment & Plan Note (Signed)
Pain-free after injection 3 months ago.

## 2014-11-06 ENCOUNTER — Ambulatory Visit: Payer: Medicare Other | Admitting: Sports Medicine

## 2014-12-02 ENCOUNTER — Encounter: Payer: Self-pay | Admitting: Family Medicine

## 2014-12-02 DIAGNOSIS — F411 Generalized anxiety disorder: Secondary | ICD-10-CM

## 2014-12-02 MED ORDER — FLUOXETINE HCL 20 MG PO TABS: 20.0000 mg | ORAL_TABLET | Freq: Every day | ORAL | Status: DC

## 2014-12-03 ENCOUNTER — Telehealth: Payer: Self-pay

## 2014-12-03 ENCOUNTER — Other Ambulatory Visit: Payer: Self-pay | Admitting: Family Medicine

## 2014-12-03 NOTE — Telephone Encounter (Signed)
Left detailed message.   

## 2014-12-03 NOTE — Telephone Encounter (Signed)
Margaret [name] states she has been taking both the lisinopril and Benicar for a month or so. She thought she had picked up Singulair and not lisinopril. She was thinking she was to switch off lisinopril to Benicar. I advised patient that on a email from [place] Dr Ileene Rubens does mention he would switch you to Benicar from lisinopril. I will send message to Dr Ileene Rubens to confirm if patient should take both or one or the other.

## 2014-12-03 NOTE — Telephone Encounter (Signed)
I would recommend she not take lisinopril but to take benicar instead.

## 2014-12-09 ENCOUNTER — Ambulatory Visit: Payer: Medicare Other | Admitting: Sports Medicine

## 2014-12-17 ENCOUNTER — Encounter: Payer: Self-pay | Admitting: Family Medicine

## 2014-12-17 DIAGNOSIS — M1711 Unilateral primary osteoarthritis, right knee: Secondary | ICD-10-CM | POA: Insufficient documentation

## 2015-01-28 ENCOUNTER — Encounter: Payer: Self-pay | Admitting: Family Medicine

## 2015-01-28 ENCOUNTER — Ambulatory Visit (INDEPENDENT_AMBULATORY_CARE_PROVIDER_SITE_OTHER): Payer: 59 | Admitting: Family Medicine

## 2015-01-28 VITALS — BP 162/94 | HR 71 | Temp 97.7°F | Wt 133.0 lb

## 2015-01-28 DIAGNOSIS — IMO0002 Reserved for concepts with insufficient information to code with codable children: Secondary | ICD-10-CM

## 2015-01-28 DIAGNOSIS — L680 Hirsutism: Secondary | ICD-10-CM

## 2015-01-28 DIAGNOSIS — E348 Other specified endocrine disorders: Secondary | ICD-10-CM

## 2015-01-28 DIAGNOSIS — J45909 Unspecified asthma, uncomplicated: Secondary | ICD-10-CM

## 2015-01-28 DIAGNOSIS — I10 Essential (primary) hypertension: Secondary | ICD-10-CM

## 2015-01-28 DIAGNOSIS — L659 Nonscarring hair loss, unspecified: Secondary | ICD-10-CM

## 2015-01-28 DIAGNOSIS — E349 Endocrine disorder, unspecified: Secondary | ICD-10-CM

## 2015-01-28 MED ORDER — OLMESARTAN MEDOXOMIL 40 MG PO TABS: 40.0000 mg | ORAL_TABLET | Freq: Every day | ORAL | Status: DC

## 2015-01-28 MED ORDER — SPIRONOLACTONE 50 MG PO TABS: 50.0000 mg | ORAL_TABLET | Freq: Every day | ORAL | Status: DC

## 2015-01-28 MED ORDER — TIOTROPIUM BROMIDE MONOHYDRATE 2.5 MCG/ACT IN AERS: INHALATION_SPRAY | RESPIRATORY_TRACT | Status: DC

## 2015-01-28 NOTE — Progress Notes (Signed)
CC: Margaret [name] is a 69 y.o. female is here for Facial Swelling   Subjective: HPI:  Follow-up hormone deficiency: Tells me that since she started on DHEA and progesterone she feels better than she has in years. She's also noticed that her hair is beginning to grow back. Unfortunate she's also noticed that she continues to have facial hair which is not colored but significantly causes psychological distress and decrease his quality of life. Symptoms have not gotten better or worse since starting topical hormones. Denies Hair growth elsewhere other than on top of the head and face mostly on the chin  Follow-up essential hypertension: Continues on Benicar 20 mg daily. No outside blood pressures report. No chest pain shortness of breath orthopnea nor peripheral edema  Complains of a dry mouth that has been present for an unknown amount of time at least for the last month. She noticed that when it is at its worst she will get sores on the midline of the buccal mucosa of her cheeks. She has them now and they're painful. Painful with tracking coffee or acidic beverages. Nothing seems to make them better. They come and go over the course of a few weeks. She is uncertain whether this started after taking Spiriva but she is confident it was not present just prior to taking Spiriva. Denies any cough shortness of breath or wheezing over the past month.   Review Of Systems Outlined In HPI  Past Medical History  Diagnosis Date  . Asthma   . Hypertension     Past Surgical History  Procedure Laterality Date  . Joint replacement      LT KNEE  . Abdominal hysterectomy    . Total knee arthroplasty  2006   Family History  Problem Relation Age of Onset  . Emphysema Mother   . Stroke Other     History   Social History  . Marital Status: Married    Spouse Name: N/A  . Number of Children: N/A  . Years of Education: N/A   Occupational History  . Not on file.   Social History Main Topics  .  Smoking status: Former Smoker    Quit date: 02/01/2008  . Smokeless tobacco: Not on file  . Alcohol Use: Yes     Comment: SOCIALLY  . Drug Use: No  . Sexual Activity: Yes   Other Topics Concern  . Not on file   Social History Narrative   ** Merged History Encounter **         Objective: BP 162/94 mmHg  Pulse 71  Temp(Src) 97.7 F (36.5 C) (Oral)  Wt 133 lb (60.328 kg)  General: Alert and Oriented, No Acute Distress HEENT: Pupils equal, round, reactive to light. Conjunctivae clear.  External ears unremarkable, canals clear with intact TMs with appropriate landmarks.  Middle ear appears open without effusion. Pink inferior turbinates.  Moist mucous membranes, pharynx without inflammation nor lesions other than mild ulcerations in a linear horizontal pattern on the left and right midline of the buccal mucosa, somewhat represent aphthous ulcers.  Neck supple without palpable lymphadenopathy nor abnormal masses. Lungs: Clear to auscultation bilaterally, no wheezing/ronchi/rales.  Comfortable work of breathing. Good air movement. Cardiac: Regular rate and rhythm. Normal S1/S2.  No murmurs, rubs, nor gallops.  Extremities: No peripheral edema.  Strong peripheral pulses.  Mental Status: No depression, anxiety, nor agitation. Skin: Warm and dry. Trace hirsutism of the face mostly on the chin  Assessment & Plan: Margaret [name] was seen today for  facial swelling.  Diagnoses and all orders for this visit:  Hormone deficiency Orders: -     DHEA -     Progesterone  Essential hypertension Orders: -     olmesartan (BENICAR) 40 MG tablet; Take 1 tablet (40 mg total) by mouth daily.  Hirsutism Orders: -     spironolactone (ALDACTONE) 50 MG tablet; Take 1 tablet (50 mg total) by mouth daily.  Hair loss  Intrinsic asthma, unspecified asthma severity, uncomplicated  Other orders -     Tiotropium Bromide Monohydrate (SPIRIVA RESPIMAT) 2.5 MCG/ACT AERS; Inhale 1 puff daily   Hormone  deficiency: Repeating DHEA and progesterone. The blood will need to [facility] if levels are not abnormally elevated Essential hypertension: Uncontrolled chronic condition increasing Benicar Hirsutism: Begin spironolactone pending hormone labs Asthma: Controlled her concern that Spiriva is causing dry mouth which is resulting in local mucosal damage inside the cheeks. Begin respiat formulation to see if this reduces dry mouth, sample was provided, call if beneficial for refills  Return in about 3 months (around 04/28/2015).

## 2015-01-29 LAB — PROGESTERONE: Progesterone: <0.2 ng/mL

## 2015-02-02 ENCOUNTER — Telehealth: Payer: Self-pay | Admitting: Family Medicine

## 2015-02-02 MED ORDER — PREDNISONE 20 MG PO TABS: ORAL_TABLET | ORAL | Status: AC

## 2015-02-02 NOTE — Telephone Encounter (Signed)
Margaret [name], Rx for prednisone sent to [facility] per her most recent result note.

## 2015-02-02 NOTE — Telephone Encounter (Signed)
Pt.notified

## 2015-02-03 LAB — DHEA: DHEA: 65 ng/dL — ABNORMAL LOW (ref 102–1185)

## 2015-02-09 ENCOUNTER — Telehealth: Payer: Self-pay | Admitting: Family Medicine

## 2015-02-09 MED ORDER — AMBULATORY NON FORMULARY MEDICATION: Status: DC

## 2015-02-09 MED ORDER — AMBULATORY NON FORMULARY MEDICATION
Status: DC
Start: 2015-02-09 — End: 2015-03-26

## 2015-02-09 NOTE — Telephone Encounter (Signed)
Message left on vm and rx faxed

## 2015-02-09 NOTE — Telephone Encounter (Signed)
Seth Bake, Will you please update patient that [facility] has recommended she continue her current topical hormonal therapy and begin an oral progesterone supplement, can you please fax this off to [facility] (in your inbox)

## 2015-02-16 ENCOUNTER — Telehealth: Payer: Self-pay

## 2015-02-16 MED ORDER — ACYCLOVIR 400 MG PO TABS: 400.0000 mg | ORAL_TABLET | Freq: Three times a day (TID) | ORAL | Status: DC

## 2015-02-16 NOTE — Telephone Encounter (Signed)
Rx of acyclovir, an antiviral, sent to her [facility], if no better in one week will refer to ENT specialist.

## 2015-02-16 NOTE — Telephone Encounter (Signed)
Margaret [name] reports a worsening of mouth sores after taking prednisone. She states the number of mouth sores have doubled. Please advise.

## 2015-02-16 NOTE — Telephone Encounter (Signed)
Pt.notified

## 2015-03-06 ENCOUNTER — Encounter: Payer: Self-pay | Admitting: Family Medicine

## 2015-03-06 ENCOUNTER — Ambulatory Visit (INDEPENDENT_AMBULATORY_CARE_PROVIDER_SITE_OTHER): Payer: 59 | Admitting: Family Medicine

## 2015-03-06 VITALS — BP 108/79 | HR 76 | Wt 128.0 lb

## 2015-03-06 DIAGNOSIS — K1379 Other lesions of oral mucosa: Secondary | ICD-10-CM

## 2015-03-06 DIAGNOSIS — J45909 Unspecified asthma, uncomplicated: Secondary | ICD-10-CM

## 2015-03-06 MED ORDER — TIOTROPIUM BROMIDE MONOHYDRATE 2.5 MCG/ACT IN AERS: INHALATION_SPRAY | RESPIRATORY_TRACT | Status: DC

## 2015-03-06 MED ORDER — PREDNISONE 20 MG PO TABS: ORAL_TABLET | ORAL | Status: AC

## 2015-03-06 NOTE — Progress Notes (Signed)
CC: Margaret [name] is a 69 y.o. female is here for Follow-up   Subjective: HPI:  Continued mild discomfort described as a soreness on the inside of her mouth. She's also had some white patches that are erupting. Since I saw her last symptoms were slightly improved with prednisone however then slowly returned. Her symptoms were also improved with a mouthwash that was provided by her dentist however she has run out of this a week ago. She was taking this for 7 days and stated that it was making a large improvement with her symptoms. Since then they have not Better or worse are currently mild in severity and involving the entire cheeks but not so much the tongue. No other interventions as of yet. No nasal congestion, sore throat, tongue pain, dental pain, swelling of the face fevers nor chills  She is requesting a refill on Spiriva. The new respimat seems to cause less dry mouth. She would like a refill on this. Denies cough, wheezing, shortness of breath or chest discomfort   Review Of Systems Outlined In HPI  Past Medical History  Diagnosis Date  . Asthma   . Hypertension     Past Surgical History  Procedure Laterality Date  . Joint replacement      LT KNEE  . Abdominal hysterectomy    . Total knee arthroplasty  2006   Family History  Problem Relation Age of Onset  . Emphysema Mother   . Stroke Other     History   Social History  . Marital Status: Married    Spouse Name: N/A  . Number of Children: N/A  . Years of Education: N/A   Occupational History  . Not on file.   Social History Main Topics  . Smoking status: Former Smoker    Quit date: 02/01/2008  . Smokeless tobacco: Not on file  . Alcohol Use: Yes     Comment: SOCIALLY  . Drug Use: No  . Sexual Activity: Yes   Other Topics Concern  . Not on file   Social History Narrative   ** Merged History Encounter **         Objective: BP 108/79 mmHg  Pulse 76  Wt 128 lb (58.06 kg)  SpO2 96%  General: Alert and  Oriented, No Acute Distress HEENT: Pupils equal, round, reactive to light. Conjunctivae clear.  Moist mucous membranes, pharynx without inflammation nor lesions.  Buccal mucosal of the cheeks slightly pale but no other visual abnormality or palpable abnormality.Neck supple without palpable lymphadenopathy nor abnormal masses. Lungs: clear and comfortable work of breathing Cardiac: Regular rate and rhythm. Mental Status: No depression, anxiety, nor agitation. Skin: Warm and dry.  Assessment & Plan: Brittne was seen today for follow-up.  Diagnoses and all orders for this visit:  Intrinsic asthma, unspecified asthma severity, uncomplicated  Mouth pain  Other orders -     Tiotropium Bromide Monohydrate (SPIRIVA RESPIMAT) 2.5 MCG/ACT AERS; Inhale 1 puff daily -     predniSONE (DELTASONE) 20 MG tablet; Two tabs at once daily for seven days.  Mouth pain: Refills were provided of her Magic mouthwash through [facility]. She'll also start a 7 day course of a moderate dose of prednisone since both of these were beneficial when used separately in the past. If no benefit after 1 week will need referral to ear nose and throat. Asthma: Controlled continue Spiriva, if insurance causes any problems with coverage will try the lower dose.  Return if symptoms worsen or fail to improve.

## 2015-03-23 ENCOUNTER — Telehealth: Payer: Self-pay | Admitting: *Deleted

## 2015-03-23 MED ORDER — PREDNISONE 20 MG PO TABS: ORAL_TABLET | ORAL | Status: AC

## 2015-03-23 NOTE — Telephone Encounter (Signed)
Pt.notified

## 2015-03-23 NOTE — Telephone Encounter (Signed)
Pt is requesting a refill of prednisone for her mouth pain. She states she does have an appt with ENT but it is not until the first part of may. She states the prednisone is the only thing at present that seems to help her mouth pain.please advise

## 2015-03-23 NOTE — Telephone Encounter (Signed)
Rx sent to [facility] on [address]

## 2015-03-26 ENCOUNTER — Other Ambulatory Visit: Payer: Self-pay | Admitting: Family Medicine

## 2015-04-05 ENCOUNTER — Emergency Department
Admission: EM | Admit: 2015-04-05 | Discharge: 2015-04-05 | Disposition: A | Discharge status: Home / Self Care | Payer: 59 | Source: Home / Self Care | Attending: Emergency Medicine | Admitting: Emergency Medicine

## 2015-04-05 ENCOUNTER — Encounter: Payer: Self-pay | Admitting: Emergency Medicine

## 2015-04-05 DIAGNOSIS — I952 Hypotension due to drugs: Secondary | ICD-10-CM | POA: Diagnosis not present

## 2015-04-05 DIAGNOSIS — R002 Palpitations: Secondary | ICD-10-CM | POA: Diagnosis not present

## 2015-04-05 DIAGNOSIS — R079 Chest pain, unspecified: Secondary | ICD-10-CM | POA: Diagnosis not present

## 2015-04-05 NOTE — ED Provider Notes (Signed)
CSN: [id number]     Arrival date & time 04/05/15  1220 History   First MD Initiated Contact with Patient 04/05/15 1231     Chief Complaint  Patient presents with  . Palpitations   (Consider location/radiation/quality/duration/timing/severity/associated sxs/prior Treatment) Patient is a 69 y.o. female presenting with palpitations. The history is provided by the patient. No language interpreter was used.  Palpitations Palpitations quality:  Irregular Onset quality:  Gradual Duration:  1 day Timing:  Sporadic Progression:  Unchanged Chronicity:  New Context: stimulant use   Context: not caffeine, not dehydration, not illicit drugs and not nicotine   Relieved by:  Nothing Worsened by:  Nothing Ineffective treatments:  None tried Associated symptoms: no chest pain, no lower extremity edema and no shortness of breath     Past Medical History  Diagnosis Date  . Asthma   . Hypertension    Past Surgical History  Procedure Laterality Date  . Joint replacement      LT KNEE  . Abdominal hysterectomy    . Total knee arthroplasty  2006   Family History  Problem Relation Age of Onset  . Emphysema Mother   . Stroke Other    History  Substance Use Topics  . Smoking status: Former Smoker    Quit date: 02/01/2008  . Smokeless tobacco: Not on file  . Alcohol Use: Yes     Comment: SOCIALLY   OB History    No data available     Review of Systems  Respiratory: Negative for shortness of breath.   Cardiovascular: Positive for palpitations. Negative for chest pain.  All other systems reviewed and are negative. Pt reports Dr. Ileene Rubens increased her Bp medication from 20 to 40 mg of benicar 2 months ago.  Pt reports bp was low at ENT on Friday 80/50, low yesterdy at home and low today.   Pt reports she felt like her heart was pounding and her pulse was over 120  Allergies  Floxin; Floxin; Neosporin; Other; and Tetracyclines & related  Home Medications   Prior to Admission medications    Medication Sig Start Date End Date Taking? Authorizing Provider  Alum & Mag Hydroxide-Simeth (MAGIC MOUTHWASH) SOLN Take 5 mLs by mouth 4 (four) times daily. 03/06/15   Sean Hommel, DO  AMBULATORY NON FORMULARY MEDICATION DHEA 5mg /0.4mL cream - Apply 1/71mL to inner labia once daily after shower, dispense quantity for three months. 02/09/15   Marcial Pacas, DO  AMBULATORY NON FORMULARY MEDICATION Progesterone 8% cream: Apply 41mL behind knee QHS dispense quantity for three months. 03/26/15   Marcial Pacas, DO  AMBULATORY NON FORMULARY MEDICATION Progesterone 100mg  SR Capsules, one by mouth at bedtime daily. 03/26/15   Sean Hommel, DO  FLUoxetine (PROZAC) 20 MG tablet Take 1 tablet (20 mg total) by mouth daily. 12/02/14   Sean Hommel, DO  olmesartan (BENICAR) 40 MG tablet Take 1 tablet (40 mg total) by mouth daily. 01/28/15   Marcial Pacas, DO  omeprazole (PRILOSEC) 20 MG capsule Take 1 capsule (20 mg total) by mouth 2 (two) times daily. 03/07/13 03/07/14  Marcial Pacas, DO  predniSONE (DELTASONE) 20 MG tablet Three tabs daily days 1-3, two tabs daily days 4-6, one tab daily days 7-9, half tab daily days 10-13. 03/23/15 04/05/15  Marcial Pacas, DO  raloxifene (EVISTA) 60 MG tablet TAKE 1 TABLET (60 MG TOTAL) BY MOUTH DAILY.    Marcial Pacas, DO  spironolactone (ALDACTONE) 50 MG tablet Take 1 tablet (50 mg total) by mouth daily. 01/28/15  Marcial Pacas, DO  Tiotropium Bromide Monohydrate (SPIRIVA RESPIMAT) 2.5 MCG/ACT AERS Inhale 1 puff daily 03/06/15   Sean Hommel, DO   BP 127/77 mmHg  Pulse 88  Temp(Src) 97.6 F (36.4 C) (Oral)  Resp 16  Ht 5' 1.5" (1.562 m)  Wt 126 lb (57.153 kg)  BMI 23.42 kg/m2  SpO2 95% Physical Exam  Constitutional: She is oriented to person, place, and time. She appears well-developed and well-nourished.  HENT:  Head: Normocephalic and atraumatic.  Right Ear: External ear normal.  Eyes: EOM are normal. Pupils are equal, round, and reactive to light.  Neck: Normal range of motion.   Cardiovascular: Normal rate and normal heart sounds.   Pulmonary/Chest: Effort normal.  Abdominal: Soft. She exhibits no distension.  Musculoskeletal: Normal range of motion.  Neurological: She is alert and oriented to person, place, and time.  Skin: Skin is warm.  Psychiatric: She has a normal mood and affect.  Nursing note and vitals reviewed.   ED Course  Procedures (including critical care time) Labs Review Labs Reviewed - No data to display  Imaging Review No results found. EKg normal sinus, no st changes, normal qrs, normal qt.   MDM  Exam normal, EKG normal,  Normal orthostatics,   Pt did not take benicar today.  I listened to pt twice, no murmur, no palpitations or irregularity.   Pt may need holter monitor to figure out what heart racing/palpitation is    1. Hypotension due to drugs   2. Palpitations    Pt advised hold benicar today. Call Dr. Ileene Rubens to schedule appointment to be seen and ask about going back on 20 mg benicar.   Ask about holter monitoring AVS     Fransico Meadow, PA-C 04/05/15 1347

## 2015-04-05 NOTE — ED Notes (Signed)
Reports palpitations and lower than normal blood pressure the past couple of days; denies chest pain; has taken her anti-hypertensive rx today; mild shortness of breath and nausea.

## 2015-04-05 NOTE — Discharge Instructions (Signed)

## 2015-04-06 ENCOUNTER — Ambulatory Visit (INDEPENDENT_AMBULATORY_CARE_PROVIDER_SITE_OTHER): Payer: 59 | Admitting: Physician Assistant

## 2015-04-06 ENCOUNTER — Encounter: Payer: Self-pay | Admitting: Physician Assistant

## 2015-04-06 VITALS — BP 170/90 | HR 76 | Wt 133.0 lb

## 2015-04-06 DIAGNOSIS — I10 Essential (primary) hypertension: Secondary | ICD-10-CM

## 2015-04-06 DIAGNOSIS — R002 Palpitations: Secondary | ICD-10-CM

## 2015-04-06 NOTE — Progress Notes (Signed)
   Subjective:    Patient ID: Margaret [name], female    DOB: Mar 07, 1946, 69 y.o.   MRN: [id number]  HPI  Pt presents to the clinic to discuss flucuating blood pressures. She was seen in UC yesterday because she checked her BP at home and was 107/57 and her heart was fluttering. She went to UC and BP and pulse great and EKG normal. No labs were done. Denies any CP but often has dizziness. She complains of heart fluttering more and more consisently. She has been on benicar since before December but Dr. Ileene Rubens did recently increase benicar to 40mg  after a elevated BP reading in office. She is currently not taking any BP medication for last 2 days. This morning checked BP and was 130/105. She also went to ENT last week and they were concerned with low BP reading and per pt was 88/64.     Review of Systems  All other systems reviewed and are negative.      Objective:   Physical Exam  Constitutional: She is oriented to person, place, and time. She appears well-developed and well-nourished.  HENT:  Head: Normocephalic and atraumatic.  Cardiovascular: Normal rate, regular rhythm and normal heart sounds.   Pulmonary/Chest: Effort normal and breath sounds normal.  Neurological: She is alert and oriented to person, place, and time.  Skin: Skin is dry.  Psychiatric: She has a normal mood and affect. Her behavior is normal.          Assessment & Plan:  Hypertension/palpitations- BP seem to be flucuating but BP today too high. Start back on benicar 20mg  daily. Will get some labs to look for causes of fluttering/BP changes. Reassured EKG at [facility] was great. Will get pt set up with holter monitor. HR today is perfect. Discussed causes of palpiations and most are benign. Will take a look at kidneys via u/s to make sure nothing growing on kidneys or adrenals causing problems with BP stabilization. Follow up with Dr. Ileene Rubens as needed or after getting testing done.

## 2015-04-06 NOTE — Patient Instructions (Addendum)
Will get blood work.  Renal ultrasound holter monitor.  benicar 20mg  daily.   Call follow up.   Palpitations A palpitation is the feeling that your heartbeat is irregular or is faster than normal. It may feel like your heart is fluttering or skipping a beat. Palpitations are usually not a serious problem. However, in some cases, you may need further medical evaluation. CAUSES  Palpitations can be caused by:  Smoking.  Caffeine or other stimulants, such as diet pills or energy drinks.  Alcohol.  Stress and anxiety.  Strenuous physical activity.  Fatigue.  Certain medicines.  Heart disease, especially if you have a history of irregular heart rhythms (arrhythmias), such as atrial fibrillation, atrial flutter, or supraventricular tachycardia.  An improperly working pacemaker or defibrillator. DIAGNOSIS  To find the cause of your palpitations, your health care provider will take your medical history and perform a physical exam. Your health care provider may also have you take a test called an ambulatory electrocardiogram (ECG). An ECG records your heartbeat patterns over a 24-hour period. You may also have other tests, such as:  Transthoracic echocardiogram (TTE). During echocardiography, sound waves are used to evaluate how blood flows through your heart.  Transesophageal echocardiogram (TEE).  Cardiac monitoring. This allows your health care provider to monitor your heart rate and rhythm in real time.  Holter monitor. This is a portable device that records your heartbeat and can help diagnose heart arrhythmias. It allows your health care provider to track your heart activity for several days, if needed.  Stress tests by exercise or by giving medicine that makes the heart beat faster. TREATMENT  Treatment of palpitations depends on the cause of your symptoms and can vary greatly. Most cases of palpitations do not require any treatment other than time, relaxation, and monitoring  your symptoms. Other causes, such as atrial fibrillation, atrial flutter, or supraventricular tachycardia, usually require further treatment. HOME CARE INSTRUCTIONS   Avoid:  Caffeinated coffee, tea, soft drinks, diet pills, and energy drinks.  Chocolate.  Alcohol.  Stop smoking if you smoke.  Reduce your stress and anxiety. Things that can help you relax include:  A method of controlling things in your body, such as your heartbeats, with your mind (biofeedback).  Yoga.  Meditation.  Physical activity such as swimming, jogging, or walking.  Get plenty of rest and sleep. SEEK MEDICAL CARE IF:   You continue to have a fast or irregular heartbeat beyond 24 hours.  Your palpitations occur more often. SEEK IMMEDIATE MEDICAL CARE IF:  You have chest pain or shortness of breath.  You have a severe headache.  You feel dizzy or you faint. MAKE SURE YOU:  Understand these instructions.  Will watch your condition.  Will get help right away if you are not doing well or get worse. Document Released: 11/18/2000 Document Revised: 11/26/2013 Document Reviewed: 01/20/2012 [facility] Patient Information 2015 [place], [place]. This information is not intended to replace advice given to you by your health care provider. Make sure you discuss any questions you have with your health care provider.

## 2015-04-07 LAB — CBC WITH DIFFERENTIAL/PLATELET
Basophils Absolute: 0 10*3/uL (ref 0.0–0.1)
Basophils Relative: 0 % (ref 0–1)
EOS PCT: 2 % (ref 0–5)
Eosinophils Absolute: 0.2 10*3/uL (ref 0.0–0.7)
HCT: 37.4 % (ref 36.0–46.0)
HEMOGLOBIN: 12.6 g/dL (ref 12.0–15.0)
LYMPHS ABS: 2.6 10*3/uL (ref 0.7–4.0)
Lymphocytes Relative: 23 % (ref 12–46)
MCH: 32.5 pg (ref 26.0–34.0)
MCHC: 33.7 g/dL (ref 30.0–36.0)
MCV: 96.4 fL (ref 78.0–100.0)
MPV: 9.2 fL (ref 8.6–12.4)
Monocytes Absolute: 0.8 10*3/uL (ref 0.1–1.0)
Monocytes Relative: 7 % (ref 3–12)
Neutro Abs: 7.8 10*3/uL — ABNORMAL HIGH (ref 1.7–7.7)
Neutrophils Relative %: 68 % (ref 43–77)
Platelets: 272 10*3/uL (ref 150–400)
RBC: 3.88 MIL/uL (ref 3.87–5.11)
RDW: 13.4 % (ref 11.5–15.5)
WBC: 11.4 10*3/uL — ABNORMAL HIGH (ref 4.0–10.5)

## 2015-04-07 LAB — COMPLETE METABOLIC PANEL WITH GFR
ALK PHOS: 41 U/L (ref 39–117)
ALT: 16 U/L (ref 0–35)
AST: 17 U/L (ref 0–37)
Albumin: 4.3 g/dL (ref 3.5–5.2)
BILIRUBIN TOTAL: 0.4 mg/dL (ref 0.2–1.2)
BUN: 19 mg/dL (ref 6–23)
CO2: 27 mEq/L (ref 19–32)
CREATININE: 0.94 mg/dL (ref 0.50–1.10)
Calcium: 9.3 mg/dL (ref 8.4–10.5)
Chloride: 100 mEq/L (ref 96–112)
GFR, EST AFRICAN AMERICAN: 72 mL/min
GFR, EST NON AFRICAN AMERICAN: 63 mL/min
Glucose, Bld: 91 mg/dL (ref 70–99)
POTASSIUM: 4.5 meq/L (ref 3.5–5.3)
Sodium: 137 mEq/L (ref 135–145)
TOTAL PROTEIN: 6.7 g/dL (ref 6.0–8.3)

## 2015-04-07 LAB — VITAMIN B12: Vitamin B-12: 1184 pg/mL — ABNORMAL HIGH (ref 211–911)

## 2015-04-07 LAB — VITAMIN D 25 HYDROXY (VIT D DEFICIENCY, FRACTURES): Vit D, 25-Hydroxy: 21 ng/mL — ABNORMAL LOW (ref 30–100)

## 2015-04-07 LAB — FOLATE: Folate: >20 ng/mL

## 2015-04-07 LAB — TSH: TSH: 5.455 u[IU]/mL — ABNORMAL HIGH (ref 0.350–4.500)

## 2015-04-08 ENCOUNTER — Telehealth: Payer: Self-pay | Admitting: *Deleted

## 2015-04-08 ENCOUNTER — Encounter: Payer: Self-pay | Admitting: Physician Assistant

## 2015-04-08 ENCOUNTER — Other Ambulatory Visit: Payer: Self-pay | Admitting: Physician Assistant

## 2015-04-08 DIAGNOSIS — R748 Abnormal levels of other serum enzymes: Secondary | ICD-10-CM | POA: Insufficient documentation

## 2015-04-08 DIAGNOSIS — R7989 Other specified abnormal findings of blood chemistry: Secondary | ICD-10-CM | POA: Insufficient documentation

## 2015-04-08 DIAGNOSIS — E559 Vitamin D deficiency, unspecified: Secondary | ICD-10-CM | POA: Insufficient documentation

## 2015-04-08 MED ORDER — VITAMIN D (ERGOCALCIFEROL) 1.25 MG (50000 UNIT) PO CAPS: 50000.0000 [IU] | ORAL_CAPSULE | ORAL | Status: DC

## 2015-04-08 MED ORDER — LEVOTHYROXINE SODIUM 25 MCG PO TABS: 25.0000 ug | ORAL_TABLET | Freq: Every day | ORAL | Status: DC

## 2015-04-08 NOTE — Addendum Note (Signed)
Addended by: Beatris Ship L on: 04/08/2015 04:00 PM   Modules accepted: Orders

## 2015-04-10 LAB — METANEPHRINES, PLASMA
Metanephrine, Free: 26 pg/mL (ref <=57)
Normetanephrine, Free: 151 pg/mL — ABNORMAL HIGH (ref <=148)
Total Metanephrines-Plasma: 177 pg/mL (ref <=205)

## 2015-04-10 LAB — CATECHOLAMINES, FRACTIONATED, PLASMA
CATECHOLAMINES, TOTAL: 625 pg/mL
Norepinephrine: 625 pg/mL

## 2015-05-01 ENCOUNTER — Other Ambulatory Visit: Payer: Self-pay | Admitting: Family Medicine

## 2015-06-09 ENCOUNTER — Other Ambulatory Visit: Payer: Self-pay | Admitting: *Deleted

## 2015-06-09 DIAGNOSIS — F411 Generalized anxiety disorder: Secondary | ICD-10-CM

## 2015-06-09 MED ORDER — FLUOXETINE HCL 20 MG PO TABS: 20.0000 mg | ORAL_TABLET | Freq: Every day | ORAL | Status: DC

## 2015-06-10 ENCOUNTER — Other Ambulatory Visit: Payer: Self-pay

## 2015-06-10 MED ORDER — RALOXIFENE HCL 60 MG PO TABS: 60.0000 mg | ORAL_TABLET | Freq: Every day | ORAL | Status: DC

## 2015-06-16 ENCOUNTER — Encounter: Payer: Self-pay | Admitting: Family Medicine

## 2015-06-16 ENCOUNTER — Ambulatory Visit (INDEPENDENT_AMBULATORY_CARE_PROVIDER_SITE_OTHER): Payer: 59 | Admitting: Family Medicine

## 2015-06-16 VITALS — BP 99/53 | HR 78 | Wt 132.0 lb

## 2015-06-16 DIAGNOSIS — M7061 Trochanteric bursitis, right hip: Secondary | ICD-10-CM | POA: Diagnosis not present

## 2015-06-16 DIAGNOSIS — M7060 Trochanteric bursitis, unspecified hip: Secondary | ICD-10-CM | POA: Insufficient documentation

## 2015-06-16 MED ORDER — MELOXICAM 7.5 MG PO TABS: 7.5000 mg | ORAL_TABLET | Freq: Every day | ORAL | Status: DC

## 2015-06-16 NOTE — Patient Instructions (Signed)
Thank you for coming in today. Call or go to the ER if you develop a large red swollen joint with extreme pain or oozing puss.  Due to the exercises we discussed 30 side leg raises a few times a day. Return as needed.  Hip Bursitis Bursitis is a swelling and soreness (inflammation) of a fluid-filled sac (bursa). This sac overlies and protects the joints.  CAUSES   Injury.  Overuse of the muscles surrounding the joint.  Arthritis.  Gout.  Infection.  Cold weather.  Inadequate warm-up and conditioning prior to activities. The cause may not be known.  SYMPTOMS   Mild to severe irritation.  Tenderness and swelling over the outside of the hip.  Pain with motion of the hip.  If the bursa becomes infected, a fever may be present. Redness, tenderness, and warmth will develop over the hip. Symptoms usually lessen in 3 to 4 weeks with treatment, but can come back. TREATMENT If conservative treatment does not work, your caregiver may advise draining the bursa and injecting cortisone into the area. This may speed up the healing process. This may also be used as an initial treatment of choice. HOME CARE INSTRUCTIONS   Apply ice to the affected area for 15-20 minutes every 3 to 4 hours while awake for the first 2 days. Put the ice in a plastic bag and place a towel between the bag of ice and your skin.  Rest the painful joint as much as possible, but continue to put the joint through a normal range of motion at least 4 times per day. When the pain lessens, begin normal, slow movements and usual activities to help prevent stiffness of the hip.  Only take over-the-counter or prescription medicines for pain, discomfort, or fever as directed by your caregiver.  Use crutches to limit weight bearing on the hip joint, if advised.  Elevate your painful hip to reduce swelling. Use pillows for propping and cushioning your legs and hips.  Gentle massage may provide comfort and decrease  swelling. SEEK IMMEDIATE MEDICAL CARE IF:   Your pain increases even during treatment, or you are not improving.  You have a fever.  You have heat and inflammation over the involved bursa.  You have any other questions or concerns. MAKE SURE YOU:   Understand these instructions.  Will watch your condition.  Will get help right away if you are not doing well or get worse. Document Released: 05/13/2002 Document Revised: 02/13/2012 Document Reviewed: 12/10/2008 [facility] Patient Information 2015 [place], [place]. This information is not intended to replace advice given to you by your health care provider. Make sure you discuss any questions you have with your health care provider.

## 2015-06-16 NOTE — Assessment & Plan Note (Signed)
Status post injection. Plan for home exercises working on hip abduction strengthening. Return as needed. Treat with meloxicam also.

## 2015-06-16 NOTE — Progress Notes (Signed)
Margaret [name] is a 69 y.o. female who presents to [facility]  today for Right hip pain. Patient has a few month history of right lateral hip pain. The pain is worse with standing from a seated position and lying on the right side. She has tried some over-the-counter as well as her chronic Vicodin which has helped a little. She denies any radiating pain weakness or numbness fevers or chills. She denies any injury. No vomiting diarrhea chest pains or palpitations.   Past Medical History  Diagnosis Date  . Asthma   . Hypertension    Past Surgical History  Procedure Laterality Date  . Joint replacement      LT KNEE  . Abdominal hysterectomy    . Total knee arthroplasty  2006   History  Substance Use Topics  . Smoking status: Former Smoker    Quit date: 02/01/2008  . Smokeless tobacco: Not on file  . Alcohol Use: Yes     Comment: SOCIALLY   ROS as above Medications: Current Outpatient Prescriptions  Medication Sig Dispense Refill  . AMBULATORY NON FORMULARY MEDICATION DHEA 5mg /0.32mL cream - Apply 1/3mL to inner labia once daily after shower, dispense quantity for three months. 1 Units 1  . AMBULATORY NON FORMULARY MEDICATION Progesterone 8% cream: Apply 47mL behind knee QHS dispense quantity for three months. 1 Units 1  . AMBULATORY NON FORMULARY MEDICATION Progesterone 100mg  SR Capsules, one by mouth at bedtime daily. 90 capsule 1  . FLUoxetine (PROZAC) 20 MG tablet Take 1 tablet (20 mg total) by mouth daily. 30 tablet 0  . levothyroxine (SYNTHROID, LEVOTHROID) 25 MCG tablet Take 1 tablet (25 mcg total) by mouth daily before breakfast. 30 tablet 1  . olmesartan (BENICAR) 40 MG tablet Take 1 tablet (40 mg total) by mouth daily. 30 tablet 11  . raloxifene (EVISTA) 60 MG tablet Take 1 tablet (60 mg total) by mouth daily. 30 tablet 0  . spironolactone (ALDACTONE) 50 MG tablet Take 1 tablet (50 mg total) by mouth daily. 30 tablet 5  . Tiotropium Bromide  Monohydrate (SPIRIVA RESPIMAT) 2.5 MCG/ACT AERS Inhale 1 puff daily 1 Inhaler 5  . Vitamin D, Ergocalciferol, (DRISDOL) 50000 UNITS CAPS capsule Take 1 capsule (50,000 Units total) by mouth every 7 (seven) days. 12 capsule 0  . meloxicam (MOBIC) 7.5 MG tablet Take 1-2 tablets (7.5-15 mg total) by mouth daily. 60 tablet 0  . omeprazole (PRILOSEC) 20 MG capsule Take 1 capsule (20 mg total) by mouth 2 (two) times daily. 180 capsule 3   No current facility-administered medications for this visit.   Allergies  Allergen Reactions  . Floxin [Ofloxacin]   . Floxin [Ofloxacin] Hives  . Neosporin [Neomycin-Polymyxin-Gramicidin] Hives  . Other     ANTIBIOTIC OINT - UNSURE OF NAME   . Tetracyclines & Related      Exam:  BP 99/53 mmHg  Pulse 78  Wt 132 lb (59.875 kg) Gen: Well NAD HEENT: EOMI,  MMM Lungs: Normal work of breathing. CTABL Heart: RRR no MRG Abd: NABS, Soft. Nondistended, Nontender Exts: Brisk capillary refill, warm and well perfused.  Right hip normal-appearing. Mildly tender to palpation right lateral greater trochanter and on the insertion of the hip abductors and external rotators. Hip range of motion is intact. Negative Corky Sox and FADIR tests. Abduction strength is diminished to 4/5   Limited musculoskeletal ultrasound of the right greater trochanter Bone is visualized and normal without significant lesions. Trochanteric bursa is visualized mildly inflamed. Insertion  of the hip abductors and external rotator visualized without significant defect  Hip greater trochanteric injection: Right Consent obtained and timeout performed. Area of maximum tenderness palpated and identified. Skin cleaned with alcohol, cold spray applied. A 25 gauge needle was used to access the greater trochanteric bursa. 80 mg of Depo-Medrol, and 4 mL of Marcaine were used to inject the trochanteric bursa. Patient tolerated the procedure well.   No results found for this or any previous visit  (from the past 24 hour(s)). No results found.   Please see individual assessment and plan sections. This visit required moderate complexity and decision making.

## 2015-06-22 ENCOUNTER — Ambulatory Visit: Payer: 59 | Admitting: Family Medicine

## 2015-06-30 ENCOUNTER — Ambulatory Visit (INDEPENDENT_AMBULATORY_CARE_PROVIDER_SITE_OTHER): Payer: 59 | Admitting: Family Medicine

## 2015-06-30 ENCOUNTER — Encounter: Payer: Self-pay | Admitting: Family Medicine

## 2015-06-30 VITALS — BP 177/94 | HR 57 | Wt 131.0 lb

## 2015-06-30 DIAGNOSIS — M810 Age-related osteoporosis without current pathological fracture: Secondary | ICD-10-CM

## 2015-06-30 DIAGNOSIS — E039 Hypothyroidism, unspecified: Secondary | ICD-10-CM | POA: Diagnosis not present

## 2015-06-30 DIAGNOSIS — E559 Vitamin D deficiency, unspecified: Secondary | ICD-10-CM

## 2015-06-30 DIAGNOSIS — L659 Nonscarring hair loss, unspecified: Secondary | ICD-10-CM

## 2015-06-30 DIAGNOSIS — I10 Essential (primary) hypertension: Secondary | ICD-10-CM | POA: Diagnosis not present

## 2015-06-30 LAB — TSH: TSH: 5.889 u[IU]/mL — ABNORMAL HIGH (ref 0.350–4.500)

## 2015-06-30 MED ORDER — OLMESARTAN MEDOXOMIL 40 MG PO TABS: 20.0000 mg | ORAL_TABLET | Freq: Every day | ORAL | Status: DC

## 2015-06-30 MED ORDER — RALOXIFENE HCL 60 MG PO TABS: 60.0000 mg | ORAL_TABLET | Freq: Every day | ORAL | Status: DC

## 2015-06-30 NOTE — Progress Notes (Signed)
CC: Margaret [name] is a 69 y.o. female is here for check labs   Subjective: HPI:  Follow-up hypertension: Stopped taking Benicar completely after her visit earlier in July with sports medicine when she noticed her systolic blood pressure was below 100. She's reported that she feels that she has more energy since stopping this medication. Checking blood pressure at home she's noticed that systolics range around 147-829 and diastolics in the high 56O. Denies chest pain shortness of breath orthopnea nor peripheral edema  Follow-up osteoporosis: Still taking Evista on a daily basis without any known side effects. Denies any new joint or bone pain.  Follow-up hyperthyroidism: She was found to have a mildly elevated TSH 3 months ago she was given levothyroxine but took this on a daily basis only for the 2 months that was provided to her. She denies unintentional weight loss or gain or palpitations.  Follow-up hair loss: Continues to have no difficulty with growing hair on the scalp since stopping lisinopril.   Review Of Systems Outlined In HPI  Past Medical History  Diagnosis Date  . Asthma   . Hypertension     Past Surgical History  Procedure Laterality Date  . Joint replacement      LT KNEE  . Abdominal hysterectomy    . Total knee arthroplasty  2006   Family History  Problem Relation Age of Onset  . Emphysema Mother   . Stroke Other     History   Social History  . Marital Status: Married    Spouse Name: N/A  . Number of Children: N/A  . Years of Education: N/A   Occupational History  . Not on file.   Social History Main Topics  . Smoking status: Former Smoker    Quit date: 02/01/2008  . Smokeless tobacco: Not on file  . Alcohol Use: Yes     Comment: SOCIALLY  . Drug Use: No  . Sexual Activity: Yes   Other Topics Concern  . Not on file   Social History Narrative   ** Merged History Encounter **         Objective: BP 177/94 mmHg  Pulse 57  Wt 131 lb (59.421  kg)  General: Alert and Oriented, No Acute Distress HEENT: Pupils equal, round, reactive to light. Conjunctivae clear.  Moist mucous membranes pharynx unremarkable Lungs: Clear to auscultation bilaterally, no wheezing/ronchi/rales.  Comfortable work of breathing. Good air movement. Cardiac: Regular rate and rhythm. Normal S1/S2.  No murmurs, rubs, nor gallops.   Extremities: No peripheral edema.  Strong peripheral pulses.  Mental Status: No depression, anxiety, nor agitation. Skin: Warm and dry.  Assessment & Plan: Allena was seen today for check labs.  Diagnoses and all orders for this visit:  Essential hypertension, benign  Osteoporosis Orders: -     raloxifene (EVISTA) 60 MG tablet; Take 1 tablet (60 mg total) by mouth daily.  Hypothyroidism, unspecified hypothyroidism type Orders: -     TSH  Hair loss Orders: -     Vitamin D (25 hydroxy) -     Progesterone -     DHEA  Essential hypertension Orders: -     olmesartan (BENICAR) 40 MG tablet; Take 0.5 tablets (20 mg total) by mouth daily.  Vitamin D deficiency   Essential hypertension: Uncontrolled chronic condition restart Benicar at only 20 mg daily, she was provided with a sheet to take home blood pressure readings and drop this off my review after 1 week Hyperthyroidism: Due for TSH, No chief  been off of levothyroxine for over a month now. Hair loss: Due for progesterone DHE A since progesterone was increased early 2016   Return in about 3 months (around 09/30/2015).

## 2015-06-30 NOTE — Patient Instructions (Signed)
Please check blood pressure daily and write down daily readings, return in one week:                  .

## 2015-07-01 LAB — PROGESTERONE: Progesterone: 10.5 ng/mL

## 2015-07-01 LAB — VITAMIN D 25 HYDROXY (VIT D DEFICIENCY, FRACTURES): VIT D 25 HYDROXY: 35 ng/mL (ref 30–100)

## 2015-07-02 ENCOUNTER — Telehealth: Payer: Self-pay | Admitting: Family Medicine

## 2015-07-02 MED ORDER — LEVOTHYROXINE SODIUM 50 MCG PO TABS: 50.0000 ug | ORAL_TABLET | Freq: Every day | ORAL | Status: DC

## 2015-07-02 NOTE — Telephone Encounter (Signed)
Pt.notified

## 2015-07-02 NOTE — Telephone Encounter (Signed)
Seth Bake, Will you please let patient know that her [place] lab is still pending however progesterone and vitamin D are normal.  Her thyroid supplement appears to be under-dosed therefore I've sent a new dose of 50 micrograms to her [facility].  We'll want to recheck this in three months.

## 2015-07-04 LAB — DHEA: DHEA: 25 ng/dL — ABNORMAL LOW (ref 102–1185)

## 2015-07-07 ENCOUNTER — Other Ambulatory Visit: Payer: Self-pay | Admitting: Family Medicine

## 2015-08-07 ENCOUNTER — Telehealth: Payer: Self-pay | Admitting: Family Medicine

## 2015-08-07 MED ORDER — AMBULATORY NON FORMULARY MEDICATION: Status: DC

## 2015-08-07 NOTE — Telephone Encounter (Signed)
Andrea, Rx placed in in-box ready for pickup/faxing.  

## 2015-08-07 NOTE — Telephone Encounter (Signed)
faxed

## 2015-08-11 ENCOUNTER — Other Ambulatory Visit: Payer: Self-pay | Admitting: Family Medicine

## 2015-08-18 ENCOUNTER — Encounter: Payer: Self-pay | Admitting: Family Medicine

## 2015-08-18 ENCOUNTER — Ambulatory Visit (INDEPENDENT_AMBULATORY_CARE_PROVIDER_SITE_OTHER): Payer: 59 | Admitting: Family Medicine

## 2015-08-18 VITALS — BP 170/109 | HR 70 | Wt 131.0 lb

## 2015-08-18 DIAGNOSIS — Z23 Encounter for immunization: Secondary | ICD-10-CM

## 2015-08-18 DIAGNOSIS — F411 Generalized anxiety disorder: Secondary | ICD-10-CM

## 2015-08-18 DIAGNOSIS — I10 Essential (primary) hypertension: Secondary | ICD-10-CM | POA: Diagnosis not present

## 2015-08-18 MED ORDER — LOSARTAN POTASSIUM 100 MG PO TABS: 100.0000 mg | ORAL_TABLET | Freq: Every day | ORAL | Status: DC

## 2015-08-18 MED ORDER — FLUOXETINE HCL 20 MG PO CAPS: 20.0000 mg | ORAL_CAPSULE | Freq: Every day | ORAL | Status: DC

## 2015-08-18 NOTE — Progress Notes (Signed)
CC: Margaret [name] is a 69 y.o. female is here for Hypertension   Subjective: HPI:   follow-up hypertension: She ran Benicar to 3 weeks ago. She is unable to afford this due to the fact that it will cost her $150 a month. She's been checking her blood pressures at home all of which are in stage I or stage II hypertension.She denies chest pain shortness of breath orthopnea nor peripheral edema. Denies any change in her sodium intake or exercise routine.  Follow-up anxiety: She would like a refill onProzac. She is doing a great job of reducing her irritability and anxiousness. She denies any symptoms whatsoever. She denies depression, sleep disturbance, nor paranoia. She denies any thoughts of wanting to harm self or others.    Review Of Systems Outlined In HPI  Past Medical History  Diagnosis Date  . Asthma   . Hypertension     Past Surgical History  Procedure Laterality Date  . Joint replacement      LT KNEE  . Abdominal hysterectomy    . Total knee arthroplasty  2006   Family History  Problem Relation Age of Onset  . Emphysema Mother   . Stroke Other     Social History   Social History  . Marital Status: Married    Spouse Name: N/A  . Number of Children: N/A  . Years of Education: N/A   Occupational History  . Not on file.   Social History Main Topics  . Smoking status: Former Smoker    Quit date: 02/01/2008  . Smokeless tobacco: Not on file  . Alcohol Use: Yes     Comment: SOCIALLY  . Drug Use: No  . Sexual Activity: Yes   Other Topics Concern  . Not on file   Social History Narrative   ** Merged History Encounter **         Objective: BP 170/109 mmHg  Pulse 70  Wt 131 lb (59.421 kg)  General: Alert and Oriented, No Acute Distress HEENT: Pupils equal, round, reactive to light. Conjunctivae clear.  Moist mucous membranes Lungs: Clear to auscultation bilaterally, no wheezing/ronchi/rales.  Comfortable work of breathing. Good air movement. Cardiac:  Regular rate and rhythm. Normal S1/S2.  No murmurs, rubs, nor gallops.   Extremities: No peripheral edema.  Strong peripheral pulses.  Mental Status: No depression, anxiety, nor agitation. Skin: Warm and dry.  Assessment & Plan: Margaret [name] was seen today for hypertension.  Diagnoses and all orders for this visit:  Encounter for immunization  Essential hypertension, benign -     losartan (COZAAR) 100 MG tablet; Take 1 tablet (100 mg total) by mouth daily. -     FLUoxetine (PROZAC) 20 MG capsule; Take 1 capsule (20 mg total) by mouth daily.  Generalized anxiety disorder  Other orders -     Flu Vaccine QUAD 36+ mos IM  generalized anxiety disorder: Controlled with Prozac continue previous regimen Essential hypertension: Uncontrolled off of Benicar, start losartan since lisinopril caused hair loss. I've asked her to keep a log of daily blood pressures and drop this off in one week. Follow-up will be determined based on these results.   Return if symptoms worsen or fail to improve.

## 2015-08-18 NOTE — Patient Instructions (Signed)
Daily Blood Pressure Values:                   .

## 2015-08-31 ENCOUNTER — Encounter: Payer: Self-pay | Admitting: Family Medicine

## 2015-08-31 ENCOUNTER — Ambulatory Visit (INDEPENDENT_AMBULATORY_CARE_PROVIDER_SITE_OTHER): Payer: Medicare PPO

## 2015-08-31 ENCOUNTER — Ambulatory Visit (INDEPENDENT_AMBULATORY_CARE_PROVIDER_SITE_OTHER): Payer: 59 | Admitting: Family Medicine

## 2015-08-31 VITALS — BP 152/89 | HR 73 | Wt 132.0 lb

## 2015-08-31 DIAGNOSIS — M25559 Pain in unspecified hip: Secondary | ICD-10-CM | POA: Insufficient documentation

## 2015-08-31 DIAGNOSIS — M25551 Pain in right hip: Secondary | ICD-10-CM

## 2015-08-31 DIAGNOSIS — M47896 Other spondylosis, lumbar region: Secondary | ICD-10-CM | POA: Diagnosis not present

## 2015-08-31 MED ORDER — PREDNISONE 5 MG (48) PO TBPK: ORAL_TABLET | ORAL | Status: DC

## 2015-08-31 NOTE — Progress Notes (Signed)
Margaret [name] is a 69 y.o. female who presents to [facility]  today for right buttocks pain. Patient has about a 3 week history of right buttocks pain. She has the pain is worsened over the past few days. She notes that she fell off of a ladder hanging on her right knee and right arm a few days ago. She denies hitting her back. She denies any pain radiating beyond her knee. She notes severe right buttocks pain radiating to the lateral upper right leg. She denies significant back pain. She is pain is quite bothersome at nighttime. She takes hydrocodone regularly for her chronic back pain. Her current pain is different than previous pain. She was seen about 2 months ago where she was diagnosed with greater trochanteric bursitis and had significant benefit with a greater trochanter injection.   Past Medical History  Diagnosis Date  . Asthma   . Hypertension    Past Surgical History  Procedure Laterality Date  . Joint replacement      LT KNEE  . Abdominal hysterectomy    . Total knee arthroplasty  2006   Social History  Substance Use Topics  . Smoking status: Former Smoker    Quit date: 02/01/2008  . Smokeless tobacco: Not on file  . Alcohol Use: Yes     Comment: SOCIALLY   family history includes Emphysema in her mother; Stroke in her other.  ROS as above Medications: Current Outpatient Prescriptions  Medication Sig Dispense Refill  . AMBULATORY NON FORMULARY MEDICATION DHEA 5mg /0.31mL cream - Apply 1/34mL to inner labia once daily after shower, dispense quantity for three months. 1 Units 1  . AMBULATORY NON FORMULARY MEDICATION Progesterone 8% cream: Apply 21mL behind knee QHS dispense quantity for three months. 1 Units 1  . AMBULATORY NON FORMULARY MEDICATION Progesterone 100mg  SR Capsules, one by mouth at bedtime daily. 90 capsule 1  . FLUoxetine (PROZAC) 20 MG capsule Take 1 capsule (20 mg total) by mouth daily. 90 capsule 1  . levothyroxine  (SYNTHROID, LEVOTHROID) 50 MCG tablet Take 1 tablet (50 mcg total) by mouth daily before breakfast. 90 tablet 1  . losartan (COZAAR) 100 MG tablet Take 1 tablet (100 mg total) by mouth daily. 90 tablet 3  . raloxifene (EVISTA) 60 MG tablet Take 1 tablet (60 mg total) by mouth daily. 90 tablet 2  . Tiotropium Bromide Monohydrate (SPIRIVA RESPIMAT) 2.5 MCG/ACT AERS Inhale 1 puff daily 1 Inhaler 5  . omeprazole (PRILOSEC) 20 MG capsule Take 1 capsule (20 mg total) by mouth 2 (two) times daily. 180 capsule 3  . predniSONE (STERAPRED UNI-PAK 48 TAB) 5 MG (48) TBPK tablet 12 day dosepack po 48 tablet 0   No current facility-administered medications for this visit.   Allergies  Allergen Reactions  . Floxin [Ofloxacin]   . Floxin [Ofloxacin] Hives  . Neosporin [Neomycin-Polymyxin-Gramicidin] Hives  . Other     ANTIBIOTIC OINT - UNSURE OF NAME   . Tetracyclines & Related      Exam:  BP 152/89 mmHg  Pulse 73  Wt 132 lb (59.875 kg) Gen: Well NAD HEENT: EOMI,  MMM Lungs: Normal work of breathing. CTABL Heart: RRR no MRG Abd: NABS, Soft. Nondistended, Nontender Exts: Brisk capillary refill, warm and well perfused.  Back: Nontender to midline. Nontender SI joint. Negative straight leg raise test and Faber test. Lower showing a reflexes and strength are intact and equal bilaterally. Hip nontender to greater trochanter. Nontender buttocks. Normal hip range of  motion bilaterally is no impingement or significant pain. Strength is intact bilaterally. Mild pain with piriformis stretch right side. Normal gait  No results found for this or any previous visit (from the past 24 hour(s)). No results found.   Please see individual assessment and plan sections.

## 2015-08-31 NOTE — Assessment & Plan Note (Signed)
Etiology at this time is somewhat unclear. Initially I was suspicious for greater trochanteric bursitis however she is completely nontender in this area. She has normal hip motion. I suspicious her pain is likely due to some sort of hip abductor strain or piriformis strain. Plan for prednisone dose pack, physical therapy, and x-ray of L-spine and hip and pelvis. If no better obtain MRI.

## 2015-08-31 NOTE — Patient Instructions (Signed)
Thank you for coming in today. Come back or go to the emergency room if you notice new weakness new numbness problems walking or bowel or bladder problems. Attend physical therapy.  Take prednisone.  Get xrays today.  Return in 3 weeks.

## 2015-09-01 ENCOUNTER — Telehealth: Payer: Self-pay | Admitting: Family Medicine

## 2015-09-01 NOTE — Telephone Encounter (Signed)
Pt.notified

## 2015-09-01 NOTE — Telephone Encounter (Signed)
Margaret [name], Will you please let patient know that her average home BP readings are in the normal range therefore I'd recommend continuing on losartan and f/u in 3 months.

## 2015-09-01 NOTE — Progress Notes (Signed)
Quick Note:  Mild right hip arthritis. ______

## 2015-09-08 ENCOUNTER — Encounter: Payer: Self-pay | Admitting: Physical Therapy

## 2015-09-08 ENCOUNTER — Ambulatory Visit (INDEPENDENT_AMBULATORY_CARE_PROVIDER_SITE_OTHER): Payer: Medicare PPO | Admitting: Physical Therapy

## 2015-09-08 DIAGNOSIS — R531 Weakness: Secondary | ICD-10-CM

## 2015-09-08 DIAGNOSIS — M25551 Pain in right hip: Secondary | ICD-10-CM | POA: Diagnosis not present

## 2015-09-08 NOTE — Patient Instructions (Addendum)
Leg Lift: One-Leg     [place] [phone]    Press pelvis down. Keep knee straight; lengthen and lift one leg (from waist). Do not twist body. Keep other leg down. Hold _1__ seconds. Relax. Repeat 10 time. Repeat with other leg. Don't let pelvis rock.    Lumbar Rotation (Non-Weight Bearing)    Feet on floor, slowly rock knees from side to side in small, pain-free range of motion. Allow lower back to rotate slightly. Repeat _10___ times per set. Do ____ sets per session. Do __2__ sessions per day. 1   Knee-to-Chest Stretch: Unilateral    With hand behind right knee, pull knee in to chest until a comfortable stretch is felt in lower back and buttocks. Keep back relaxed. Hold __30-45__ seconds. Repeat _1___ times per set. Do ___1_ sets per session. Do ____ sessions per day  Copyright  VHI. All rights reserved.   Trigger Point Dry Needling  . What is Trigger Point Dry Needling (DN)? o DN is a physical therapy technique used to treat muscle pain and dysfunction. Specifically, DN helps deactivate muscle trigger points (muscle knots).  o A thin filiform needle is used to penetrate the skin and stimulate the underlying trigger point. The goal is for a local twitch response (LTR) to occur and for the trigger point to relax. No medication of any kind is injected during the procedure.   . What Does Trigger Point Dry Needling Feel Like?  o The procedure feels different for each individual patient. Some patients report that they do not actually feel the needle enter the skin and overall the process is not painful. Very mild bleeding may occur. However, many patients feel a deep cramping in the muscle in which the needle was inserted. This is the local twitch response.   [name] How Will I feel after the treatment? o Soreness is normal, and the onset of soreness may not occur for a few hours. Typically this soreness does not last longer than two days.  o Bruising is uncommon, however; ice can be  used to decrease any possible bruising.  o In rare cases feeling tired or nauseous after the treatment is normal. In addition, your symptoms may get worse before they get better, this period will typically not last longer than 24 hours.   . What Can I do After My Treatment? o Increase your hydration by drinking more water for the next 24 hours. o You may place ice or heat on the areas treated that have become sore, however, do not use heat on inflamed or bruised areas. Heat often brings more relief post needling. o You can continue your regular activities, but vigorous activity is not recommended initially after the treatment for 24 hours. o DN is best combined with other physical therapy such as strengthening, stretching, and other therapies.

## 2015-09-08 NOTE — Therapy (Signed)
[facility] [place], [name], [address] Phone: [phone]   Fax:  [phone]  Physical Therapy Evaluation  Patient Details  Name: Margaret [name] MRN: [id number] Date of Birth: 02/25/1946 Referring Provider:  Gregor Hams, MD  Encounter Date: 09/08/2015      PT End of Session - 09/08/15 1202    Visit Number 1   Number of Visits 8   Date for PT Re-Evaluation 10/06/15   PT Start Time 1106   PT Stop Time 1145   PT Time Calculation (min) 39 min   Activity Tolerance --  pt with some increased pain after eval, reports she will use her home TENs       Past Medical History  Diagnosis Date  . Asthma   . Hypertension     Past Surgical History  Procedure Laterality Date  . Joint replacement      LT KNEE  . Abdominal hysterectomy    . Total knee arthroplasty  2006    There were no vitals filed for this visit.  Visit Diagnosis:  Pain, hip, right - Plan: PT plan of care cert/re-cert  Weakness generalized - Plan: PT plan of care cert/re-cert      Subjective Assessment - 09/08/15 1109    Subjective Pt reports lumbar fusion L4-5 one year ago. She still has to take medicine for pain. She fell off a step ladder about 6 wks ago and landed on her arm because she wanted to protect her back. She is using a home TENs unit on her back and Rt hip. Her pain now travels to the front of her thigh and into her Rt great toe.  This started last week.    Pertinent History is weaning off her prednisone now. [place] for back pain, this does not help her hip.    How long can you sit comfortably? unable to sleep, has to lean to the left in sitting    How long can you stand comfortably? varies   How long can you walk comfortably? limited  10-15 min at times.    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    Currently in Pain? Yes   Pain Score 6    Pain  Location Hip   Pain Orientation Right   Pain Descriptors / Indicators Sore   Pain Type Acute pain   Pain Radiating Towards into  the Rt thigh and great toe   Pain Onset 1 to 4 weeks ago   Pain Frequency Constant  intensity varies   Aggravating Factors  never knows if/when it will increase, sometimes bending, prolonged walking. , lying on her sides   Pain Relieving Factors ice some   Multiple Pain Sites Yes   Pain Score 6  this is her baseline since surgery   Pain Location Back   Pain Orientation Mid;Lower   Pain Descriptors / Indicators Dull   Pain Type Chronic pain            [facility] PT Assessment - 09/08/15 0001    Assessment   Medical Diagnosis Rt hip pain   Onset Date/Surgical Date 08/11/15   Hand Dominance Right   Next MD Visit 10/19/15   Prior Therapy for her backin the past   Precautions   Precautions --  Osteoporosis   Balance Screen   Has the patient fallen in the past 6 months Yes  about  6 wks ago off a step ladder while painting   How many times? 1   Has the patient had a decrease in activity level because of a fear of falling?  No   Is the patient reluctant to leave their home because of a fear of falling?  No   Home Ecologist residence   Living Arrangements Spouse/significant other   Home Access Stairs to enter   Entrance Stairs-Number of Steps 1  no trouble   Prior Function   Level of Independence Independent   Vocation Retired   Solicitor, working one day a week   Leisure Media planner, moves alot, sewing   Observation/Other Assessments   Focus on Therapeutic Outcomes (FOTO)  50% limited   Functional Tests   Functional tests Squat   Squat   Comments shifts to the Lt, Rt knee adducts   Posture/Postural Control   Posture/Postural Control Postural limitations   Postural Limitations Increased lumbar lordosis   ROM / Strength   AROM / PROM / Strength AROM;Strength   AROM   Overall AROM Comments  bilat LE'sWNL   AROM Assessment Site Lumbar   Lumbar Flexion WFL   Lumbar Extension WFL   Lumbar - Right Rotation WFL  pain in Rt hip   Lumbar - Left Rotation WFL   Strength   Overall Strength Comments knees/ankles WNL, core good (-)   Strength Assessment Site Hip   Right/Left Hip Right;Left   Right Hip Flexion --  5-/5   Right Hip Extension 4/5   Right Hip ABduction 4+/5   Left Hip Flexion 5/5   Left Hip Extension 5/5   Left Hip ABduction 5/5   Flexibility   Soft Tissue Assessment /Muscle Length --  LE's WNL   Palpation   Spinal mobility assesed lumbar spine around fusion, good CPA mobility.    SI assessment  WNL no pain   Palpation comment very tight and tender in Lt lower lumbar paraspinals, Rt upper gluts. Some tightness in Rt lumbar paraspinals.   Special Tests    Special Tests --  (-) lumbar and SIJ special tests                   [facility] Adult PT Treatment/Exercise - 09/08/15 0001    Exercises   Exercises Lumbar   Lumbar Exercises: Stretches   Single Knee to Chest Stretch 2 reps;30 seconds  bilat   Lower Trunk Rotation --  10 reps bilat   Lumbar Exercises: Prone   Straight Leg Raise 20 reps  with pelvic press on both sides                PT Education - 09/08/15 1140    Education provided Yes   Education Details HEP and trigger point dry needling   Person(s) Educated Patient   Methods Explanation;Handout   Comprehension Returned demonstration;Verbalized understanding             PT Long Term Goals - 09/08/15 1210    PT LONG TERM GOAL #1   Title I with advanced HEP for core ( 10/06/15)    Time 4   Period Weeks   Status New   PT LONG TERM GOAL #2   Title report Rt hip pain decrease =/> 50% ( 10/06/15)    Time 4   Period Weeks   Status New   PT LONG TERM GOAL #3   Title improve Rt hip strength =/> 5-/5 through out (  10/06/15)    Time 4   Period Weeks   Status New   PT LONG TERM GOAL #4   Title improve FOTO =/< 41% limited (  CK level ) ( 10/06/15)    Time 4   Period Weeks   Status New   PT LONG TERM GOAL #5   Title report 50% improvement in tolerance on her work day in standing ( 10/06/15)    Time 4   Period Weeks   Status New               Plan - 09/08/15 1203    Clinical Impression Statement 69 yo female  presents with Rt hip pain that is inconsistent with hip bursitis. I feel she may be having some referral pain form her low back to this point as she has more trigger points in the back then the hip. She has also developed some weakness in the Rt hip.    Pt will benefit from skilled therapeutic intervention in order to improve on the following deficits Decreased strength;Pain;Increased muscle spasms   Rehab Potential Good   PT Frequency 2x / week   PT Duration 4 weeks   PT Treatment/Interventions Manual techniques;Therapeutic exercise;Moist Heat;Electrical Stimulation;Cryotherapy;Dry needling;Patient/family education;Ultrasound   PT Next Visit Plan TPR to low back and Rt gluts, dry needling to lumbar multifidi, core stabilization   Consulted and Agree with Plan of Care Patient         Problem List Patient Active Problem List   Diagnosis Date Noted  . Hip pain, acute 08/31/2015  . Vitamin D insufficiency 04/08/2015  . Elevated vitamin B12 level 04/08/2015  . Thyroid activity decreased 04/08/2015  . Hirsutism 01/28/2015  . Right knee DJD 12/17/2014  . Osteoarthritis of right knee 11/04/2014  . Trochanteric bursitis of right hip 08/21/2014  . Hair loss 08/21/2014  . Hypothyroidism 07/24/2014  . Generalized anxiety disorder 07/23/2014  . Vasomotor flushing 03/05/2014  . Intrinsic asthma 12/17/2013  . Spinal stenosis of lumbar region 10/14/2013  . Coffee ground emesis 03/06/2013  . Disorders of bursae and tendons in shoulder region, unspecified (LEFT) 03/01/2013  . Anal fissure 02/22/2013  . Diverticulosis of colon without hemorrhage 02/22/2013  . Hyperglycemia 02/01/2013  . Essential  hypertension, benign 01/31/2013  . CAP (community acquired pneumonia) 01/31/2013  . Osteoporosis 01/31/2013  . HSV infection 01/31/2013    Jeral Pinch PT 09/08/2015, 12:21 PM  [facility] [place], [name], [address] Phone: [phone]   Fax:  [phone]

## 2015-09-09 ENCOUNTER — Encounter: Payer: Self-pay | Admitting: Family Medicine

## 2015-09-09 ENCOUNTER — Ambulatory Visit (INDEPENDENT_AMBULATORY_CARE_PROVIDER_SITE_OTHER): Payer: 59 | Admitting: Family Medicine

## 2015-09-09 VITALS — BP 140/76 | HR 76 | Wt 133.0 lb

## 2015-09-09 DIAGNOSIS — I10 Essential (primary) hypertension: Secondary | ICD-10-CM

## 2015-09-09 DIAGNOSIS — J45909 Unspecified asthma, uncomplicated: Secondary | ICD-10-CM

## 2015-09-09 MED ORDER — HYDROCHLOROTHIAZIDE 25 MG PO TABS: ORAL_TABLET | ORAL | Status: DC

## 2015-09-09 NOTE — Progress Notes (Signed)
CC: Margaret [name] is a 69 y.o. female is here for Hypertension   Subjective: HPI:  Follow-up essential hypertension: She's been taking her blood pressures at home over the past week. She's noticed that she is getting readings in the stage I hypertension and stage II hypertension range. She denies changing anything in her diet or day-to-day routines. She did start on prednisone around the time of her elevated blood pressures. She only has 1 or 2 days left of prednisone. She denies headaches, chest pain, shortness of breath or motor or sensory disturbances. She is taking losartan on a daily basis.  She tells me that she's felt great for a breathing standpoint in the last 2 days which surprises her. She usually doesn't seem bothered by mild shortness of breath at which she considers her baseline however for some reason she's had absolutely no shortness of breath over the past 2 or 3 days.   Review Of Systems Outlined In HPI  Past Medical History  Diagnosis Date  . Asthma   . Hypertension     Past Surgical History  Procedure Laterality Date  . Joint replacement      LT KNEE  . Abdominal hysterectomy    . Total knee arthroplasty  2006   Family History  Problem Relation Age of Onset  . Emphysema Mother   . Stroke Other     Social History   Social History  . Marital Status: Married    Spouse Name: N/A  . Number of Children: N/A  . Years of Education: N/A   Occupational History  . Not on file.   Social History Main Topics  . Smoking status: Former Smoker    Quit date: 02/01/2008  . Smokeless tobacco: Not on file  . Alcohol Use: Yes     Comment: SOCIALLY  . Drug Use: No  . Sexual Activity: Yes   Other Topics Concern  . Not on file   Social History Narrative   ** Merged History Encounter **         Objective: BP 140/76 mmHg  Pulse 76  Wt 133 lb (60.328 kg)  General: Alert and Oriented, No Acute Distress HEENT: Pupils equal, round, reactive to light. Conjunctivae  clear.  Moist mucous membranes Lungs: Clear to auscultation bilaterally, no wheezing/ronchi/rales.  Comfortable work of breathing. Good air movement. Cardiac: Regular rate and rhythm. Normal S1/S2.  No murmurs, rubs, nor gallops.   Extremities: No peripheral edema.  Strong peripheral pulses.  Mental Status: No depression, anxiety, nor agitation. Skin: Warm and dry.  Assessment & Plan: [place] was seen today for hypertension.  Diagnoses and all orders for this visit:  Intrinsic asthma, unspecified asthma severity, uncomplicated  Essential hypertension, benign  Other orders -     hydrochlorothiazide (HYDRODIURIL) 25 MG tablet; One tablet by mouth every morning for blood pressure control.   Asthma: Discussed that prednisone is probably helping with some background symptoms of asthma which she was not aware of. Discussed that if she has any degree of shortness of breath wheezing or cough that returns after stopping prednisone but me know and we can start a daily inhaled cortical sterilely. Essential hypertension: Uncontrolled chronic condition High suspicion that prednisone has increased her blood pressure to a significant degree that losartan is not addressing. Start hydrochlorothiazide with the hope of coming off of this in the next week or 2 after prednisone is out of her system.    Return for same day as Dr. Georgina Snell visit.

## 2015-09-11 ENCOUNTER — Encounter: Payer: Medicare PPO | Admitting: Physical Therapy

## 2015-09-16 ENCOUNTER — Ambulatory Visit (INDEPENDENT_AMBULATORY_CARE_PROVIDER_SITE_OTHER): Payer: Medicare PPO | Admitting: Physical Therapy

## 2015-09-16 DIAGNOSIS — R531 Weakness: Secondary | ICD-10-CM | POA: Diagnosis not present

## 2015-09-16 DIAGNOSIS — M25551 Pain in right hip: Secondary | ICD-10-CM

## 2015-09-16 NOTE — Therapy (Signed)
[place], [name], [address] Phone: [phone]   Fax:  [phone]  Physical Therapy Treatment  Patient Details  Name: Margaret [name] MRN: [id number] Date of Birth: 05-21-1946 Referring Provider:  Gregor Hams, MD  Encounter Date: 09/16/2015      PT End of Session - 09/16/15 1148    Visit Number 2   Number of Visits 8   Date for PT Re-Evaluation 10/06/15   PT Start Time 1104   PT Stop Time 1149   PT Time Calculation (min) 45 min      Past Medical History  Diagnosis Date  . Asthma   . Hypertension     Past Surgical History  Procedure Laterality Date  . Joint replacement      LT KNEE  . Abdominal hysterectomy    . Total knee arthroplasty  2006    There were no vitals filed for this visit.  Visit Diagnosis:  Pain, hip, right  Weakness generalized      Subjective Assessment - 09/16/15 1106    Subjective (p) Pt reports there is no change from the last visit.    Currently in Pain? (p) Yes   Pain Score (p) 5    Pain Location (p) Hip   Pain Orientation (p) Right   Pain Descriptors / Indicators (p) Aching;Dull   Pain Type (p) Acute pain   Pain Onset (p) 1 to 4 weeks ago   Multiple Pain Sites (p) Yes   Pain Score (p) 6   Pain Location (p) Back   Pain Orientation (p) Mid;Lower                         [facility] Adult PT Treatment/Exercise - 09/16/15 0001    Lumbar Exercises: Stretches   Single Knee to Chest Stretch 1 rep;30 seconds   Double Knee to Chest Stretch 1 rep;30 seconds   Lower Trunk Rotation --  10reps   Quadruped Mid Back Stretch --  10 reps   Lumbar Exercises: Supine   Ab Set 10 reps;5 seconds  with kegel   Clam 10 reps  with TA contraction   Manual Therapy   Manual Therapy Soft tissue mobilization   Soft tissue mobilization bilat lumbar paraspinals and QL, Rt gluts and piriformis          Trigger Point Dry Needling - 09/16/15 1134    Consent Given? Yes   Education Handout Provided Yes   Muscles Treated Upper Body --  Rt upper lumbar multifidis and Lt lower lumbar multifis trig   Muscles Treated Lower Body Gluteus minimus;Piriformis  Rt vastus lateralis with twitch response.    Gluteus Minimus Response Twitch response elicited   Piriformis Response --  no response                   PT Long Term Goals - 09/08/15 1210    PT LONG TERM GOAL #1   Title I with advanced HEP for core ( 10/06/15)    Time 4   Period Weeks   Status New   PT LONG TERM GOAL #2   Title report Rt hip pain decrease =/> 50% ( 10/06/15)    Time 4   Period Weeks   Status New   PT LONG TERM GOAL #3   Title improve Rt hip strength =/> 5-/5 through out ( 10/06/15)    Time 4   Period Weeks   Status New  PT LONG TERM GOAL #4   Title improve FOTO =/< 41% limited ( CK level ) ( 10/06/15)    Time 4   Period Weeks   Status New   PT LONG TERM GOAL #5   Title report 50% improvement in tolerance on her work day in standing ( 10/06/15)    Time 4   Period Weeks   Status New               Plan - 09/16/15 1402    Clinical Impression Statement Pt reported no change of symptoms at beginning of treatment.  She responded great to TDN with no Rt hip pain after treatment.  No goals met, only the second visit    Pt will benefit from skilled therapeutic intervention in order to improve on the following deficits Decreased strength;Pain;Increased muscle spasms   Rehab Potential Good   PT Frequency 2x / week   PT Duration 4 weeks   PT Treatment/Interventions Manual techniques;Therapeutic exercise;Moist Heat;Electrical Stimulation;Cryotherapy;Dry needling;Patient/family education;Ultrasound   PT Next Visit Plan TPR to low back and Rt gluts, dry needling to lumbar multifidi, core stabilization   Consulted and Agree with Plan of Care Patient        Problem List Patient Active Problem List   Diagnosis Date Noted  . Hip pain, acute 08/31/2015   . Vitamin D insufficiency 04/08/2015  . Elevated vitamin B12 level 04/08/2015  . Thyroid activity decreased 04/08/2015  . Hirsutism 01/28/2015  . Right knee DJD 12/17/2014  . Osteoarthritis of right knee 11/04/2014  . Trochanteric bursitis of right hip 08/21/2014  . Hair loss 08/21/2014  . Hypothyroidism 07/24/2014  . Generalized anxiety disorder 07/23/2014  . Vasomotor flushing 03/05/2014  . Intrinsic asthma 12/17/2013  . Spinal stenosis of lumbar region 10/14/2013  . Coffee ground emesis 03/06/2013  . Disorders of bursae and tendons in shoulder region, unspecified (LEFT) 03/01/2013  . Anal fissure 02/22/2013  . Diverticulosis of colon without hemorrhage 02/22/2013  . Hyperglycemia 02/01/2013  . Essential hypertension, benign 01/31/2013  . CAP (community acquired pneumonia) 01/31/2013  . Osteoporosis 01/31/2013  . HSV infection 01/31/2013    Jeral Pinch PT 09/16/2015, 2:04 PM  [facility] [place], [name], [address] Phone: [phone]   Fax:  [phone]

## 2015-09-18 ENCOUNTER — Encounter: Payer: 59 | Admitting: Physical Therapy

## 2015-09-22 ENCOUNTER — Ambulatory Visit: Payer: 59 | Admitting: Family Medicine

## 2015-09-23 ENCOUNTER — Ambulatory Visit (INDEPENDENT_AMBULATORY_CARE_PROVIDER_SITE_OTHER): Payer: Medicare PPO | Admitting: Physical Therapy

## 2015-09-23 ENCOUNTER — Encounter: Payer: Self-pay | Admitting: Physical Therapy

## 2015-09-23 DIAGNOSIS — M545 Low back pain, unspecified: Secondary | ICD-10-CM

## 2015-09-23 DIAGNOSIS — R531 Weakness: Secondary | ICD-10-CM

## 2015-09-23 DIAGNOSIS — M25551 Pain in right hip: Secondary | ICD-10-CM | POA: Diagnosis not present

## 2015-09-23 NOTE — Therapy (Signed)
[place], [name], [address] Phone: [phone]   Fax:  [phone]  Physical Therapy Treatment  Patient Details  Name: Ronnisha Felber MRN: [id number] Date of Birth: 05-25-1946 No Data Recorded  Encounter Date: 09/23/2015      PT End of Session - 09/23/15 1027    Visit Number 3   Number of Visits 8   Date for PT Re-Evaluation 10/06/15   PT Start Time 1022   PT Stop Time 1114   PT Time Calculation (min) 52 min   Activity Tolerance Patient tolerated treatment well  pain down ot 1/10 after treatment      Past Medical History  Diagnosis Date  . Asthma   . Hypertension     Past Surgical History  Procedure Laterality Date  . Joint replacement      LT KNEE  . Abdominal hysterectomy    . Total knee arthroplasty  2006    There were no vitals filed for this visit.  Visit Diagnosis:  Pain, hip, right  Weakness generalized  Bilateral low back pain without sciatica      Subjective Assessment - 09/23/15 1021    Subjective Pt felt she had relief for 3 days and then the pain returned to the Rt gluteal area. Hasn't been able to lie on her Rt side now. Tried to perform her HEP and had some increased pain so she stopped.    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    Currently in Pain? Yes   Pain Score 8    Pain Location Back   Pain Orientation Right;Left;Lower   Pain Descriptors / Indicators Aching  stiffness   Pain Radiating Towards tingling into bilat thighs.    Pain Onset 1 to 4 weeks ago   Pain Frequency Constant   Aggravating Factors  HEP    Pain Relieving Factors heat and TENs                         [facility] Adult PT Treatment/Exercise - 09/23/15 0001    Lumbar Exercises: Stretches   Single Knee to Chest Stretch 2 reps;20 seconds  bilat   Lumbar Exercises: Supine   Ab Set 5 reps   Clam 10 reps  with TA contraction   Heel Slides 10  reps  with TA contraction   Bent Knee Raise 10 reps  with TA contraction   Modalities   Modalities Moist Heat   Moist Heat Therapy   Number Minutes Moist Heat 15 Minutes   Moist Heat Location Lumbar Spine   Manual Therapy   Manual Therapy Soft tissue mobilization   Soft tissue mobilization bilat lumbar paraspinals and QL, Rt gluts and Rt QL          Trigger Point Dry Needling - 09/23/15 1059    Consent Given? Yes   Education Handout Provided No   Muscles Treated Lower Body --  lumbar multifidi bilat sides, 4 needle, good twitch response              PT Education - 09/23/15 1105    Education provided Yes   Education Details stop performing mini crunches at home   Person(s) Educated Patient   Methods Explanation   Comprehension Verbalized understanding             PT Long Term Goals - 09/23/15 1113    PT LONG TERM GOAL #1   Title  I with advanced HEP for core ( 10/06/15)    Status On-going   PT LONG TERM GOAL #2   Title report Rt hip pain decrease =/> 50% ( 10/06/15)    Status Achieved  patients pain is now in the lumbar spine   PT LONG TERM GOAL #3   Title improve Rt hip strength =/> 5-/5 through out ( 10/06/15)    Status On-going   PT LONG TERM GOAL #4   Title improve FOTO =/< 41% limited ( CK level ) ( 10/06/15)    Status On-going   PT LONG TERM GOAL #5   Title report 50% improvement in tolerance on her work day in standing ( 10/06/15)    Status On-going               Plan - 09/23/15 1105    Clinical Impression Statement Pt returned for treatment with reports of a different type of pain.  All in the lumbar region.  Had another great response to TDN and STW to bilat lumbar paraspinals and Rt QL with pain decreasing to 1/10.  She reports pain is the worse in the AM. Her matress is from ~ 2003, discussed getting another one and see if it helps   Pt will benefit from skilled therapeutic intervention in order to improve on the following deficits  Decreased strength;Pain;Increased muscle spasms   Rehab Potential Good   PT Frequency 2x / week   PT Duration 4 weeks   PT Treatment/Interventions Manual techniques;Therapeutic exercise;Moist Heat;Electrical Stimulation;Cryotherapy;Dry needling;Patient/family education;Ultrasound   PT Next Visit Plan TPR to low back and Rt gluts, dry needling to lumbar multifidi, core stabilization   Consulted and Agree with Plan of Care Patient        Problem List Patient Active Problem List   Diagnosis Date Noted  . Hip pain, acute 08/31/2015  . Vitamin D insufficiency 04/08/2015  . Elevated vitamin B12 level 04/08/2015  . Thyroid activity decreased 04/08/2015  . Hirsutism 01/28/2015  . Right knee DJD 12/17/2014  . Osteoarthritis of right knee 11/04/2014  . Trochanteric bursitis of right hip 08/21/2014  . Hair loss 08/21/2014  . Hypothyroidism 07/24/2014  . Generalized anxiety disorder 07/23/2014  . Vasomotor flushing 03/05/2014  . Intrinsic asthma 12/17/2013  . Spinal stenosis of lumbar region 10/14/2013  . Coffee ground emesis 03/06/2013  . Disorders of bursae and tendons in shoulder region, unspecified (LEFT) 03/01/2013  . Anal fissure 02/22/2013  . Diverticulosis of colon without hemorrhage 02/22/2013  . Hyperglycemia 02/01/2013  . Essential hypertension, benign 01/31/2013  . CAP (community acquired pneumonia) 01/31/2013  . Osteoporosis 01/31/2013  . HSV infection 01/31/2013    Jeral Pinch PT 09/23/2015, 11:19 AM  [facility] [place], [name], [address] Phone: [phone]   Fax:  [phone]  Name: Myrka Sylva MRN: [id number] Date of Birth: 1946/04/12

## 2015-09-25 ENCOUNTER — Ambulatory Visit (INDEPENDENT_AMBULATORY_CARE_PROVIDER_SITE_OTHER): Payer: Medicare PPO | Admitting: Rehabilitative and Restorative Service Providers"

## 2015-09-25 ENCOUNTER — Encounter: Payer: Self-pay | Admitting: Rehabilitative and Restorative Service Providers"

## 2015-09-25 DIAGNOSIS — M545 Low back pain, unspecified: Secondary | ICD-10-CM

## 2015-09-25 DIAGNOSIS — M25551 Pain in right hip: Secondary | ICD-10-CM

## 2015-09-25 DIAGNOSIS — R531 Weakness: Secondary | ICD-10-CM | POA: Diagnosis not present

## 2015-09-25 NOTE — Patient Instructions (Addendum)
Massage with ~4 inch rubber ball 3-5 min   Bridge    Lie back, legs bent. Inhale, tighten core, pressing hips up. Keeping ribs in, lengthen lower back. Exhale lower slowly. Repeat ___10_ times. Do __2__ sessions per day.   Lying on back with hips and knees bent,  bring one knee up toward chest and bring opposite arm up by ear. Return to starting position and repeat with other arm and leg 10 reps on each side  Upper Extremity Extension (All-Fours)    Tighten stomach and raise left arm parallel to floor. Keep trunk rigid. Repeat _10___ times per set. Do __2__ sets per session. Do ___2_ sessions per day.    Upper / Lower Extremity Extension (All-Fours)   Lifting leg only about 8-10 inches from surface  Can progress to combine both arm and leg  10 reps each leg  Tighten stomach and raise right leg and opposite arm. Keep trunk rigid. Repeat ____ times per set. Do ____ sets per session. Do ____ sessions per day.  http://orth.exer.us/1118   Copyright  VHI. All rights reserved.

## 2015-09-25 NOTE — Therapy (Addendum)
[place], [name], [address] Phone: [phone]   Fax:  [phone]  Physical Therapy Treatment  Patient Details  Name: Margaret [name] MRN: [id number] Date of Birth: [birth year] No Data Recorded  Encounter Date: 09/25/2015      PT End of Session - 09/25/15 1142    Visit Number 4   Number of Visits 8   Date for PT Re-Evaluation 10/06/15   PT Start Time 1103   PT Stop Time 1143   PT Time Calculation (min) 40 min   Activity Tolerance Patient tolerated treatment well      Past Medical History  Diagnosis Date  . Asthma   . Hypertension     Past Surgical History  Procedure Laterality Date  . Joint replacement      LT KNEE  . Abdominal hysterectomy    . Total knee arthroplasty  2006    There were no vitals filed for this visit.  Visit Diagnosis:  Pain, hip, right  Weakness generalized  Bilateral low back pain without sciatica      Subjective Assessment - 09/25/15 1105    Subjective Pt reports that she feels better when she leaves therapy  but then the symptoms return. She did not feel well at all yesterday. She would like to learn some exercises for home and wait for further therapy until after she sees the MD next week.    Currently in Pain? Yes   Pain Score 8    Pain Location Back   Pain Orientation Right;Lower   Pain Descriptors / Indicators Tightness   Pain Type Acute pain   Pain Radiating Towards tingling into bilat thighs   Pain Onset 1 to 4 weeks ago   Pain Frequency Intermittent   Aggravating Factors  walking; moving around   Pain Relieving Factors staying still; TENS unit; heat            [facility] PT Assessment - 09/25/15 0001    Assessment   Medical Diagnosis Rt hip pain   Onset Date/Surgical Date 08/11/15   Hand Dominance Right   Next MD Visit 09/29/15   Precautions   Precautions --  Osteoporosis   Strength   Overall Strength Comments knees/ankles WNL, core  good (-)   Right Hip Extension 4/5   Right Hip ABduction 4+/5   Palpation   Palpation comment continued tight and tender in Lt lower lumbar paraspinals, Rt upper gluts. Some tightness in Rt lumbar paraspinals.                     [facility] Adult PT Treatment/Exercise - 09/25/15 0001    Lumbar Exercises: Stretches   Single Knee to Chest Stretch 5 reps;20 seconds   Double Knee to Chest Stretch 5 reps;20 seconds   Lower Trunk Rotation --  10 reps   Lumbar Exercises: Supine   Ab Set 10 reps  10 sec hold   Clam 10 reps  with TA contraction   Heel Slides 10 reps  with TA contraction   Other Supine Lumbar Exercises alt arm and leg 10 reps each leg with core   Lumbar Exercises: Quadruped   [place] 5 reps   Single Arm Raise 5 reps   Straight Leg Raise 5 reps   Opposite Arm/Leg Raise 5 reps   Manual Therapy   Manual Therapy Soft tissue mobilization   Soft tissue mobilization bilat lumbar paraspinals and QL, Rt gluts and Rt QL  PT Education - 09/25/15 1139    Education provided Yes   Education Details encouraged consistent HEP; added stabilization exercises; added myofacial ball release work    [employer]) Educated Patient   Methods Explanation;Demonstration;Tactile cues;Verbal cues;Handout   Comprehension Verbalized understanding;Returned demonstration;Verbal cues required;Tactile cues required             PT Long Term Goals - 09/23/15 1113    PT LONG TERM GOAL #1   Title I with advanced HEP for core ( 10/06/15)    Status On-going   PT LONG TERM GOAL #2   Title report Rt hip pain decrease =/> 50% ( 10/06/15)    Status Achieved  patients pain is now in the lumbar spine   PT LONG TERM GOAL #3   Title improve Rt hip strength =/> 5-/5 through out ( 10/06/15)    Status On-going   PT LONG TERM GOAL #4   Title improve FOTO =/< 41% limited ( CK level ) ( 10/06/15)    Status On-going   PT LONG TERM GOAL #5   Title report 50% improvement in  tolerance on her work day in standing ( 10/06/15)    Status On-going               Plan - 09/25/15 1144    Clinical Impression Statement Pt reports that she feels better when she is in therapy and for a time after but then symptoms return. She wants a HEP and to hold on therapy until after she sees MD next week.    Pt will benefit from skilled therapeutic intervention in order to improve on the following deficits Decreased strength;Pain;Increased muscle spasms   Rehab Potential Good   PT Frequency 2x / week   PT Duration 4 weeks   PT Treatment/Interventions Manual techniques;Therapeutic exercise;Moist Heat;Electrical Stimulation;Cryotherapy;Dry needling;Patient/family education;Ultrasound   PT Next Visit Plan cont as indicated following HEP   PT Home Exercise Plan myofacial ball release work; HEP; core stabilization    Consulted and Agree with Plan of Care Patient        Problem List Patient Active Problem List   Diagnosis Date Noted  . Hip pain, acute 08/31/2015  . Vitamin D insufficiency 04/08/2015  . Elevated vitamin B12 level 04/08/2015  . Thyroid activity decreased 04/08/2015  . Hirsutism 01/28/2015  . Right knee DJD 12/17/2014  . Osteoarthritis of right knee 11/04/2014  . Trochanteric bursitis of right hip 08/21/2014  . Hair loss 08/21/2014  . Hypothyroidism 07/24/2014  . Generalized anxiety disorder 07/23/2014  . Vasomotor flushing 03/05/2014  . Intrinsic asthma 12/17/2013  . Spinal stenosis of lumbar region 10/14/2013  . Coffee ground emesis 03/06/2013  . Disorders of bursae and tendons in shoulder region, unspecified (LEFT) 03/01/2013  . Anal fissure 02/22/2013  . Diverticulosis of colon without hemorrhage 02/22/2013  . Hyperglycemia 02/01/2013  . Essential hypertension, benign 01/31/2013  . CAP (community acquired pneumonia) 01/31/2013  . Osteoporosis 01/31/2013  . HSV infection 01/31/2013    Shawndell Schillaci Nilda Simmer PT, MPH 09/25/2015, 11:48 AM  [facility] [place], [name], [address] Phone: [phone]   Fax:  [phone]  Name: Margaret [name] MRN: [id number] Date of Birth: Nov 06, 1946    PHYSICAL THERAPY DISCHARGE SUMMARY  Visits from Start of Care: 4  Current functional level related to goals / functional outcomes: Some progress with PT but continued to have symptoms. See notes for details.   Remaining deficits: Continued pain    Education /  Equipment: HEP  Plan: Patient agrees to discharge.  Patient goals were not met. Patient is being discharged due to not returning since the last visit.  ?????    Nickol Collister P. Helene Kelp PT, MPH 10/13/2015 8:31 AM

## 2015-09-29 ENCOUNTER — Ambulatory Visit (INDEPENDENT_AMBULATORY_CARE_PROVIDER_SITE_OTHER): Payer: Medicare PPO | Admitting: Family Medicine

## 2015-09-29 ENCOUNTER — Encounter: Payer: Self-pay | Admitting: Family Medicine

## 2015-09-29 VITALS — BP 164/90 | HR 90 | Wt 134.0 lb

## 2015-09-29 DIAGNOSIS — F418 Other specified anxiety disorders: Secondary | ICD-10-CM | POA: Diagnosis not present

## 2015-09-29 DIAGNOSIS — I10 Essential (primary) hypertension: Secondary | ICD-10-CM

## 2015-09-29 DIAGNOSIS — M25551 Pain in right hip: Secondary | ICD-10-CM | POA: Diagnosis not present

## 2015-09-29 DIAGNOSIS — F32A Depression, unspecified: Secondary | ICD-10-CM

## 2015-09-29 DIAGNOSIS — F329 Major depressive disorder, single episode, unspecified: Secondary | ICD-10-CM

## 2015-09-29 DIAGNOSIS — F419 Anxiety disorder, unspecified: Secondary | ICD-10-CM

## 2015-09-29 MED ORDER — FLUOXETINE HCL 40 MG PO CAPS: 40.0000 mg | ORAL_CAPSULE | Freq: Every day | ORAL | Status: DC

## 2015-09-29 MED ORDER — AMLODIPINE BESYLATE 10 MG PO TABS: 10.0000 mg | ORAL_TABLET | Freq: Every day | ORAL | Status: DC

## 2015-09-29 NOTE — Progress Notes (Signed)
Margaret [name] is a 69 y.o. female who presents to [facility]  today for follow-up hip pain. Patient has been dealing with hip pain now for months. She's been seen twice by me for the same problem. Additionally she was thought to have greater trochanteric bursitis and received a greater trochanteric injection which helps some. However last month she was reevaluated with her buttocks pain. She was thought to be musculoskeletal related and has been doing physical therapy. Physical therapy is only mildly helpful. She notes some back pain as well. She denies any radiating pain weakness or numbness. Pain is moderate. No fevers or chills nausea vomiting or diarrhea.    Past Medical History  Diagnosis Date  . Asthma   . Hypertension    Past Surgical History  Procedure Laterality Date  . Joint replacement      LT KNEE  . Abdominal hysterectomy    . Total knee arthroplasty  2006   Social History  Substance Use Topics  . Smoking status: Former Smoker    Quit date: 02/01/2008  . Smokeless tobacco: Not on file  . Alcohol Use: Yes     Comment: SOCIALLY   family history includes Emphysema in her mother; Stroke in her other.  ROS as above Medications: Current Outpatient Prescriptions  Medication Sig Dispense Refill  . AMBULATORY NON FORMULARY MEDICATION DHEA 5mg /0.95mL cream - Apply 1/47mL to inner labia once daily after shower, dispense quantity for three months. 1 Units 1  . AMBULATORY NON FORMULARY MEDICATION Progesterone 8% cream: Apply 1mL behind knee QHS dispense quantity for three months. 1 Units 1  . AMBULATORY NON FORMULARY MEDICATION Progesterone 100mg  SR Capsules, one by mouth at bedtime daily. 90 capsule 1  . levothyroxine (SYNTHROID, LEVOTHROID) 50 MCG tablet Take 1 tablet (50 mcg total) by mouth daily before breakfast. 90 tablet 1  . losartan (COZAAR) 100 MG tablet Take 1 tablet (100 mg total) by mouth daily. 90 tablet 3  . meloxicam (MOBIC) 15 MG  tablet Take 15 mg by mouth daily.    . raloxifene (EVISTA) 60 MG tablet Take 1 tablet (60 mg total) by mouth daily. 90 tablet 2  . Tiotropium Bromide Monohydrate (SPIRIVA RESPIMAT) 2.5 MCG/ACT AERS Inhale 1 puff daily 1 Inhaler 5  . amLODipine (NORVASC) 10 MG tablet Take 1 tablet (10 mg total) by mouth daily. 30 tablet 3  . FLUoxetine (PROZAC) 40 MG capsule Take 1 capsule (40 mg total) by mouth daily. 30 capsule 5   No current facility-administered medications for this visit.   Allergies  Allergen Reactions  . Floxin [Ofloxacin]   . Floxin [Ofloxacin] Hives  . Neosporin [Neomycin-Polymyxin-Gramicidin] Hives  . Other     ANTIBIOTIC OINT - UNSURE OF NAME   . Tetracyclines & Related      Exam:  BP 164/90 mmHg  Pulse 90  Wt 134 lb (60.782 kg) Gen: Well NAD HEENT: EOMI,  MMM Lungs: Normal work of breathing. CTABL Heart: RRR no MRG Abd: NABS, Soft. Nondistended, Nontender Exts: Brisk capillary refill, warm and well perfused.  Back: Nontender. Limited motion. Buttocks: Tender palpation right posterior buttocks. Hip nontender normal motion.  No results found for this or any previous visit (from the past 24 hour(s)). No results found.   Please see individual assessment and plan sections.

## 2015-09-29 NOTE — Patient Instructions (Signed)
Thank you for coming in today. Get MRI.  Return following MRI.

## 2015-09-29 NOTE — Progress Notes (Signed)
CC: Margaret [name] is a 69 y.o. female is here for Hypertension   Subjective: HPI:  Follow-up essential hypertension: Continues to take losartan and hydrochlorothiazide. She does not get a diuretic effect from hydrochlorothiazide. She brings in blood pressure readings from the last week with values in the stage I and stage II hypertension range. She denies chest pain shortness of breath orthopnea nor peripheral edema.  She tells me that her right hip pain has been persistent to a point where it's now preventing her to enjoy pleasurable hobbies. She's lost desire to try to find new hobbies due to fear that it might cause pain. She tells me that she sits around the house and just feels subjectively depressed. She denies thoughts of wanting to harm self or others or any worsening anxiety. Symptoms are present on a daily basis at least for the last month.   Review Of Systems Outlined In HPI  Past Medical History  Diagnosis Date  . Asthma   . Hypertension     Past Surgical History  Procedure Laterality Date  . Joint replacement      LT KNEE  . Abdominal hysterectomy    . Total knee arthroplasty  2006   Family History  Problem Relation Age of Onset  . Emphysema Mother   . Stroke Other     Social History   Social History  . Marital Status: Married    Spouse Name: N/A  . Number of Children: N/A  . Years of Education: N/A   Occupational History  . Not on file.   Social History Main Topics  . Smoking status: Former Smoker    Quit date: 02/01/2008  . Smokeless tobacco: Not on file  . Alcohol Use: Yes     Comment: SOCIALLY  . Drug Use: No  . Sexual Activity: Yes   Other Topics Concern  . Not on file   Social History Narrative   ** Merged History Encounter **         Objective: BP 164/90 mmHg  Pulse 90  Wt 134 lb (60.782 kg)  Vital signs reviewed. General: Alert and Oriented, No Acute Distress HEENT: Pupils equal, round, reactive to light. Conjunctivae clear.   External ears unremarkable.  Moist mucous membranes. Lungs: Clear and comfortable work of breathing, speaking in full sentences without accessory muscle use. Cardiac: Regular rate and rhythm.  Neuro: CN II-XII grossly intact, gait normal. Extremities: No peripheral edema.  Strong peripheral pulses.  Mental Status: Mild depression. No anxiety, nor agitation. Logical though process. Skin: Warm and dry.  Assessment & Plan: Ronica was seen today for hypertension.  Diagnoses and all orders for this visit:  Essential hypertension, benign -     FLUoxetine (PROZAC) 40 MG capsule; Take 1 capsule (40 mg total) by mouth daily.  Anxiety and depression  Other orders -     amLODipine (NORVASC) 10 MG tablet; Take 1 tablet (10 mg total) by mouth daily.   Essential hypertension: Uncontrolled chronic condition stopping hydrochlorothiazide and begin amlodipine, continue losartan Anxiety and depression: Anxiety is controlled however depressive symptoms occurring due to recent handicap, increasing Prozac.  Return in about 4 weeks (around 10/27/2015).

## 2015-09-29 NOTE — Assessment & Plan Note (Signed)
Unclear if due to myofascial or muscular pain in the hip rotators or if referred from back. She does have a history of back surgery. This patient has failed conservative treatment including physical therapy. We will obtain an MRI of her right help. Recheck following MRI.

## 2015-10-05 ENCOUNTER — Ambulatory Visit (INDEPENDENT_AMBULATORY_CARE_PROVIDER_SITE_OTHER): Payer: Medicare PPO

## 2015-10-05 DIAGNOSIS — X58XXXA Exposure to other specified factors, initial encounter: Secondary | ICD-10-CM | POA: Diagnosis not present

## 2015-10-05 DIAGNOSIS — S76012A Strain of muscle, fascia and tendon of left hip, initial encounter: Secondary | ICD-10-CM | POA: Diagnosis not present

## 2015-10-05 DIAGNOSIS — M25551 Pain in right hip: Secondary | ICD-10-CM

## 2015-10-05 DIAGNOSIS — S76011A Strain of muscle, fascia and tendon of right hip, initial encounter: Secondary | ICD-10-CM

## 2015-10-06 NOTE — Progress Notes (Signed)
Quick Note:  MRI shows a tear and inflammation of one of the muscles in the hip. Please return to clinic to go over the image and discuss treatment options ______

## 2015-10-07 ENCOUNTER — Encounter: Payer: Self-pay | Admitting: Family Medicine

## 2015-10-07 ENCOUNTER — Ambulatory Visit (INDEPENDENT_AMBULATORY_CARE_PROVIDER_SITE_OTHER): Payer: Medicare PPO | Admitting: Family Medicine

## 2015-10-07 VITALS — BP 144/83 | HR 79 | Temp 97.8°F | Resp 19 | Wt 139.0 lb

## 2015-10-07 DIAGNOSIS — S76311A Strain of muscle, fascia and tendon of the posterior muscle group at thigh level, right thigh, initial encounter: Secondary | ICD-10-CM

## 2015-10-07 DIAGNOSIS — M25551 Pain in right hip: Secondary | ICD-10-CM | POA: Diagnosis not present

## 2015-10-07 DIAGNOSIS — S76011A Strain of muscle, fascia and tendon of right hip, initial encounter: Secondary | ICD-10-CM

## 2015-10-07 DIAGNOSIS — S76019A Strain of muscle, fascia and tendon of unspecified hip, initial encounter: Secondary | ICD-10-CM | POA: Insufficient documentation

## 2015-10-07 NOTE — Progress Notes (Signed)
Margaret [name] is a 69 y.o. female who presents to [place]  today for follow-up right hip pain. Patient has been seen several times for right lateral hip pain. She received a greater trochanteric injection which helped only a little. She's had multiple visits with physical therapy as well. At the last visit an MRI of her pelvis was obtained which showed inflammation and some tearing of the right gluteus medius muscle along with a small right-sided labral cyst of the femoral acetabular joint and a large left-sided labral cyst of the left femoral acetabular joint. She denies any left hip pain. She does note some pain in her groin it occurs with walking as well. She additionally notes bothersome tingly sensation down the anterior aspect of her thigh to her knee. She does have a history of multilevel fusion from S1-L4 with evidence of retrolisthesis at the L3-L4 level. She denies any weakness into her leg. Symptoms are typically worse with gait.    Past Medical History  Diagnosis Date  . Asthma   . Hypertension    Past Surgical History  Procedure Laterality Date  . Joint replacement      LT KNEE  . Abdominal hysterectomy    . Total knee arthroplasty  2006   Social History  Substance Use Topics  . Smoking status: Former Smoker    Quit date: 02/01/2008  . Smokeless tobacco: Not on file  . Alcohol Use: Yes     Comment: SOCIALLY   family history includes Emphysema in her mother; Stroke in her other.  ROS as above Medications: Current Outpatient Prescriptions  Medication Sig Dispense Refill  . AMBULATORY NON FORMULARY MEDICATION DHEA 5mg /0.55mL cream - Apply 1/32mL to inner labia once daily after shower, dispense quantity for three months. 1 Units 1  . AMBULATORY NON FORMULARY MEDICATION Progesterone 8% cream: Apply 5mL behind knee QHS dispense quantity for three months. 1 Units 1  . AMBULATORY NON FORMULARY MEDICATION Progesterone 100mg  SR Capsules, one by  mouth at bedtime daily. 90 capsule 1  . amLODipine (NORVASC) 10 MG tablet Take 1 tablet (10 mg total) by mouth daily. 30 tablet 3  . FLUoxetine (PROZAC) 40 MG capsule Take 1 capsule (40 mg total) by mouth daily. 30 capsule 5  . levothyroxine (SYNTHROID, LEVOTHROID) 50 MCG tablet Take 1 tablet (50 mcg total) by mouth daily before breakfast. 90 tablet 1  . losartan (COZAAR) 100 MG tablet Take 1 tablet (100 mg total) by mouth daily. 90 tablet 3  . meloxicam (MOBIC) 15 MG tablet Take 15 mg by mouth daily.    . raloxifene (EVISTA) 60 MG tablet Take 1 tablet (60 mg total) by mouth daily. 90 tablet 2  . Tiotropium Bromide Monohydrate (SPIRIVA RESPIMAT) 2.5 MCG/ACT AERS Inhale 1 puff daily 1 Inhaler 5   No current facility-administered medications for this visit.   Allergies  Allergen Reactions  . Floxin [Ofloxacin] Hives  . Neosporin [Neomycin-Polymyxin-Gramicidin] Hives  . Other     ANTIBIOTIC OINT - UNSURE OF NAME   . Tetracyclines & Related      Exam:  BP 144/83 mmHg  Pulse 79  Temp(Src) 97.8 F (36.6 C) (Oral)  Resp 19  Wt 139 lb (63.05 kg)  SpO2 99% Gen: Well NAD Right hip: Mildly tender to palpation at the insertion of the gluteus medius onto the right greater trochanter. She is also tender along the muscle belly of the gluteus medius especially with hip abduction. She denies any significant pain or  radicular symptoms with hip flexion or Rotation. Strength is slightly diminished in abduction on the right.    Procedure: Real-time Ultrasound Guided Injection of right gluteus medius insertion  Device: GE Logiq E  Images permanently stored and available for review in the ultrasound unit. Verbal informed consent obtained. Discussed risks and benefits of procedure. Warned about infection bleeding damage to structures skin hypopigmentation and fat atrophy among others. Patient expresses understanding and agreement Time-out conducted.  Noted no overlying erythema, induration, or  other signs of local infection.  Skin prepped in a sterile fashion.  Local anesthesia: Topical Ethyl chloride.  With sterile technique and under real time ultrasound guidance: 40 mg of Kenalog and 4 mL of Marcaine injected easily.  Completed without difficulty  Pain immediately resolved suggesting accurate placement of the medication.  Advised to call if fevers/chills, erythema, induration, drainage, or persistent bleeding.  Images permanently stored and available for review in the ultrasound unit.  Impression: Technically successful ultrasound guided injection.   No results found for this or any previous visit (from the past 24 hour(s)). Mr Hip Right Wo Contrast  10/05/2015  CLINICAL DATA:  Right hip and buttock pain for 6 months. EXAM: MR OF THE RIGHT HIP WITHOUT CONTRAST TECHNIQUE: Multiplanar, multisequence MR imaging was performed. No intravenous contrast was administered. COMPARISON:  Lumbar radiographs dated 08/31/2015 FINDINGS: Muscles and tendons Muscles and tendons: There is a tear of anterior aspect of the right gluteus medius tendon. This portion of the tendon is retracted with edema throughout the torn portion of the muscle. This extends from the anterior inferior iliac spine to the insertion on the lateral aspect of the right greater trochanter. Bones: Osseous structures of the hips appear normal. Lower lumbar fusion. Articular cartilage and labrum Articular cartilage:  Normal. Labrum: Right hip: There are tears of the anterior superior and anterior lateral aspects of the labrum of the right hip. There is an extensive complex paralabral cyst which extends from the posterior superior aspect of the labrum to the anterior superior medial aspect of the labrum. Left hip: The patient has more extensive paralabral cyst formation due to labral tears at the anterior superior and superior lateral aspects of the joint. Joint or bursal effusion Joint effusion:  Normal. Bursae: There is a tiny  amount of fluid in the right iliopsoas bursa. The anterior paralabral cyst on the left is adjacent to the iliopsoas tendon. Other findings Miscellaneous: The piriformis muscles appear normal. The sciatic nerves appear to exit without impingement. Hamstring tendon origins are normal. IMPRESSION: 1. Tear of the anterior fibers of the right gluteus medius muscle. 2. Anterior superior and superior lateral labral tears of both hips with paralabral cysts bilaterally. Electronically Signed   By: Lorriane Shire M.D.   On: 10/05/2015 17:11     Please see individual assessment and plan sections.

## 2015-10-07 NOTE — Assessment & Plan Note (Signed)
I believe the patient's pain is multifactorial. I suspect that one cause of her pain is tearing and inflammation of the right gluteus medius. She had significant improvement following ultrasound guided injection to the insertion of the gluteus medius on the right.  She does have some groin pain likely is due to femoral acetabular inflammation arthritis or labrum tearing is noted on MRI. May be a target for future diagnostic and therapeutic injections.  Additionally she has some radicular symptoms to the anterior thigh. This is likely L2-3 or for radicular. She certainly has evidence of degenerative disc disease in her lumbar back. She may benefit from MRI of her back in the near future with possible epidural steroid injection.   Plan to recheck in 2 weeks

## 2015-10-07 NOTE — Patient Instructions (Signed)
Thank you for coming in today. Return in 2 weeks for recheck. Call or go to the ER if you develop a large red swollen joint with extreme pain or oozing puss.

## 2015-10-21 ENCOUNTER — Ambulatory Visit: Payer: Medicare PPO | Admitting: Family Medicine

## 2015-10-27 ENCOUNTER — Encounter: Payer: Self-pay | Admitting: Family Medicine

## 2015-10-27 ENCOUNTER — Ambulatory Visit (INDEPENDENT_AMBULATORY_CARE_PROVIDER_SITE_OTHER): Payer: Medicare PPO | Admitting: Family Medicine

## 2015-10-27 VITALS — BP 133/89 | HR 70 | Wt 132.0 lb

## 2015-10-27 DIAGNOSIS — I10 Essential (primary) hypertension: Secondary | ICD-10-CM

## 2015-10-27 MED ORDER — AMLODIPINE BESYLATE 10 MG PO TABS: 10.0000 mg | ORAL_TABLET | Freq: Every day | ORAL | Status: DC

## 2015-10-27 NOTE — Progress Notes (Signed)
CC: Margaret [name] is a 69 y.o. female is here for Hypertension   Subjective: HPI:  Since starting amlodipine she has not noticed any side effects. She noticed her blood pressure went down to the normotensive in prehypertensive range after 5 days of taking this along with losartan. She denies chest pain shortness of breath orthopnea nor peripheral edema   Review Of Systems Outlined In HPI  Past Medical History  Diagnosis Date  . Asthma   . Hypertension     Past Surgical History  Procedure Laterality Date  . Joint replacement      LT KNEE  . Abdominal hysterectomy    . Total knee arthroplasty  2006   Family History  Problem Relation Age of Onset  . Emphysema Mother   . Stroke Other     Social History   Social History  . Marital Status: Married    Spouse Name: N/A  . Number of Children: N/A  . Years of Education: N/A   Occupational History  . Not on file.   Social History Main Topics  . Smoking status: Former Smoker    Quit date: 02/01/2008  . Smokeless tobacco: Not on file  . Alcohol Use: Yes     Comment: SOCIALLY  . Drug Use: No  . Sexual Activity: Yes   Other Topics Concern  . Not on file   Social History Narrative   ** Merged History Encounter **         Objective: BP 133/89 mmHg  Pulse 70  Wt 132 lb (59.875 kg)  Vital signs reviewed. General: Alert and Oriented, No Acute Distress HEENT: Pupils equal, round, reactive to light. Conjunctivae clear.  External ears unremarkable.  Moist mucous membranes. Lungs: Clear and comfortable work of breathing, speaking in full sentences without accessory muscle use. Cardiac: Regular rate and rhythm.  Neuro: CN II-XII grossly intact, gait normal. Extremities: No peripheral edema.  Strong peripheral pulses.  Mental Status: No depression, anxiety, nor agitation. Logical though process. Skin: Warm and dry. Assessment & Plan: Margaret [name] was seen today for hypertension.  Diagnoses and all orders for this  visit:  Essential hypertension, benign -     amLODipine (NORVASC) 10 MG tablet; Take 1 tablet (10 mg total) by mouth daily.   Blood pressure is now much better controlled, continue to take losartan and amlodipine daily and return sooner than 6 months of noticing blood pressures are in the stage I or higher range.  Return in about 6 months (around 04/25/2016).

## 2015-12-23 ENCOUNTER — Ambulatory Visit (INDEPENDENT_AMBULATORY_CARE_PROVIDER_SITE_OTHER): Payer: Medicare HMO | Admitting: Family Medicine

## 2015-12-23 ENCOUNTER — Encounter: Payer: Self-pay | Admitting: Family Medicine

## 2015-12-23 VITALS — BP 140/89 | HR 83 | Wt 131.0 lb

## 2015-12-23 DIAGNOSIS — K219 Gastro-esophageal reflux disease without esophagitis: Secondary | ICD-10-CM

## 2015-12-23 DIAGNOSIS — Z1159 Encounter for screening for other viral diseases: Secondary | ICD-10-CM | POA: Diagnosis not present

## 2015-12-23 DIAGNOSIS — E039 Hypothyroidism, unspecified: Secondary | ICD-10-CM | POA: Diagnosis not present

## 2015-12-23 MED ORDER — RANITIDINE HCL 150 MG PO TABS: ORAL_TABLET | ORAL | Status: DC

## 2015-12-23 NOTE — Progress Notes (Signed)
CC: Margaret [name] is a 70 y.o. female is here for Gastroesophageal Reflux   Subjective: HPI:  Follow-up hypothyroidism: She is not sure when but she stopped taking levothyroxine. She does not think that she actually needs to be on this medication. She denies any known intolerance. No unintentional weight gain or loss. No skin complaints or hair complaints.  She tells me that most nights out of the week she'll wake up in the middle the night with a burning sensation in the back of her throat.  And will be followed by coughing and hoarseness for the rest of the day. Symptoms are worse if she eats spicy food before going to bed. Symptoms are slightly improved with Tums. She is scared of proton pump inhibitors causing Alzheimer's disease. No other interventions as of yet. She denies difficulty swallowing food or liquids. No abdominal pain   Review Of Systems Outlined In HPI  Past Medical History  Diagnosis Date  . Asthma   . Hypertension     Past Surgical History  Procedure Laterality Date  . Joint replacement      LT KNEE  . Abdominal hysterectomy    . Total knee arthroplasty  2006   Family History  Problem Relation Age of Onset  . Emphysema Mother   . Stroke Other     Social History   Social History  . Marital Status: Married    Spouse Name: N/A  . Number of Children: N/A  . Years of Education: N/A   Occupational History  . Not on file.   Social History Main Topics  . Smoking status: Former Smoker    Quit date: 02/01/2008  . Smokeless tobacco: Not on file  . Alcohol Use: Yes     Comment: SOCIALLY  . Drug Use: No  . Sexual Activity: Yes   Other Topics Concern  . Not on file   Social History Narrative   ** Merged History Encounter **         Objective: BP 140/89 mmHg  Pulse 83  Wt 131 lb (59.421 kg)  Vital signs reviewed. General: Alert and Oriented, No Acute Distress HEENT: Pupils equal, round, reactive to light. Conjunctivae clear.  External ears  unremarkable.  Moist mucous membranes. Lungs: Clear and comfortable work of breathing, speaking in full sentences without accessory muscle use. Cardiac: Regular rate and rhythm.  Neuro: CN II-XII grossly intact, gait normal. Extremities: No peripheral edema.  Strong peripheral pulses.  Mental Status: No depression, anxiety, nor agitation. Logical though process. Skin: Warm and dry.  Assessment & Plan: Margaret [name] was seen today for gastroesophageal reflux.  Diagnoses and all orders for this visit:  Hypothyroidism, unspecified hypothyroidism type -     TSH -     Hepatitis C antibody  Need for hepatitis C screening test -     TSH -     Hepatitis C antibody  Gastroesophageal reflux disease without esophagitis -     ranitidine (ZANTAC) 150 MG tablet; One by mouth at night.   Hypothyroidism: I've encouraged her to have a TSH checked today's to use as possible proof that she still needs to be taking levothyroxine. GERD: Start ranitidine before going to bed, discussed dietary habits to reduce the flux symptoms. Elevate head of bed with a single brick at both bedpost  Return if symptoms worsen or fail to improve.

## 2015-12-24 ENCOUNTER — Telehealth: Payer: Self-pay | Admitting: Family Medicine

## 2015-12-24 LAB — HEPATITIS C ANTIBODY: HCV Ab: NEGATIVE

## 2015-12-24 LAB — TSH: TSH: 2.651 u[IU]/mL (ref 0.350–4.500)

## 2015-12-24 MED ORDER — LEVOTHYROXINE SODIUM 50 MCG PO TABS: 50.0000 ug | ORAL_TABLET | Freq: Every day | ORAL | Status: DC

## 2015-12-24 NOTE — Telephone Encounter (Signed)
Pt.notified

## 2015-12-24 NOTE — Telephone Encounter (Signed)
Will you please let Margaret [name] know that her hepatitis C test was normal and her thyroid supplement appears to be adequate.  I'll send in refills of her levothyroxine to her pharmacy.

## 2015-12-28 ENCOUNTER — Telehealth: Payer: Self-pay | Admitting: Family Medicine

## 2015-12-28 ENCOUNTER — Other Ambulatory Visit: Payer: Self-pay | Admitting: Family Medicine

## 2015-12-28 ENCOUNTER — Encounter: Payer: Self-pay | Admitting: Family Medicine

## 2015-12-28 MED ORDER — LINACLOTIDE 145 MCG PO CAPS: 145.0000 ug | ORAL_CAPSULE | Freq: Every day | ORAL | Status: DC

## 2015-12-28 NOTE — Telephone Encounter (Signed)
Will you let patient know that MyChart is not letting me respond to her message.  I'm sending a Rx called [facility] to her pharmacy.

## 2015-12-28 NOTE — Telephone Encounter (Signed)
Pt.notified

## 2016-01-15 ENCOUNTER — Ambulatory Visit (INDEPENDENT_AMBULATORY_CARE_PROVIDER_SITE_OTHER): Payer: Medicare HMO | Admitting: Family Medicine

## 2016-01-15 ENCOUNTER — Encounter: Payer: Self-pay | Admitting: Family Medicine

## 2016-01-15 VITALS — BP 140/91 | HR 80 | Temp 97.7°F

## 2016-01-15 DIAGNOSIS — J45909 Unspecified asthma, uncomplicated: Secondary | ICD-10-CM | POA: Diagnosis not present

## 2016-01-15 MED ORDER — HYDROCODONE-HOMATROPINE 5-1.5 MG/5ML PO SYRP: 5.0000 mL | ORAL_SOLUTION | Freq: Three times a day (TID) | ORAL | Status: DC | PRN

## 2016-01-15 MED ORDER — METHYLPREDNISOLONE ACETATE 80 MG/ML IJ SUSP
80.0000 mg | Freq: Once | INTRAMUSCULAR | Status: AC
  Administered 2016-01-15: 80 mg via INTRAMUSCULAR

## 2016-01-15 MED ORDER — AZITHROMYCIN 250 MG PO TABS: ORAL_TABLET | ORAL | Status: AC

## 2016-01-15 NOTE — Addendum Note (Signed)
Addended by: Delrae Alfred on: 01/15/2016 02:00 PM   Modules accepted: Orders

## 2016-01-15 NOTE — Progress Notes (Signed)
CC: Margaret [name] is a 70 y.o. female is here for Sinusitis   Subjective: HPI:  Starting on Sunday she began to have a nonproductive cough. It's become productive now with wheezing and shortness of breath with walking short distances. Slightly improved with Spiriva. Also accompanied by facial pressure in the cheeks worse when lying down. She denies blood in sputum or chest pain. She denies orthopnea nor peripheral edema. Symptoms are slightly improved with Mucinex. Nothing else makes better or worse other than that described above. She denies sore throat or nausea. Denies irregular heart beat or rapid heartbeat. Symptoms of coughing or keeping her awake at night   Review Of Systems Outlined In HPI  Past Medical History  Diagnosis Date  . Asthma   . Hypertension     Past Surgical History  Procedure Laterality Date  . Joint replacement      LT KNEE  . Abdominal hysterectomy    . Total knee arthroplasty  2006   Family History  Problem Relation Age of Onset  . Emphysema Mother   . Stroke Other     Social History   Social History  . Marital Status: Married    Spouse Name: N/A  . Number of Children: N/A  . Years of Education: N/A   Occupational History  . Not on file.   Social History Main Topics  . Smoking status: Former Smoker    Quit date: 02/01/2008  . Smokeless tobacco: Not on file  . Alcohol Use: Yes     Comment: SOCIALLY  . Drug Use: No  . Sexual Activity: Yes   Other Topics Concern  . Not on file   Social History Narrative   ** Merged History Encounter **         Objective: BP 140/91 mmHg  Pulse 80  Temp(Src) 97.7 F (36.5 C) (Oral)  SpO2 97%  General: Alert and Oriented, No Acute Distress HEENT: Pupils equal, round, reactive to light. Conjunctivae clear.  External ears unremarkable, canals clear with intact TMs with appropriate landmarks.  Middle ear appears open without effusion. Pink inferior turbinates.  Moist mucous membranes, pharynx without  inflammation nor lesions.  Neck supple without palpable lymphadenopathy nor abnormal masses. Lungs: Clear to auscultation bilaterally, no wheezing/ronchi/rales.  Comfortable work of breathing. Good air movement. Frequent coughing Cardiac: Regular rate and rhythm. Normal S1/S2.  No murmurs, rubs, nor gallops.   Extremities: No peripheral edema.  Strong peripheral pulses.  Mental Status: No depression, anxiety, nor agitation. Skin: Warm and dry.  Assessment & Plan: Margaret [name] was seen today for sinusitis.  Diagnoses and all orders for this visit:  Intrinsic asthma, unspecified asthma severity, uncomplicated -     azithromycin (ZITHROMAX) 250 MG tablet; Take two tabs at once on day 1, then one tab daily on days 2-5. -     HYDROcodone-homatropine (HYCODAN) 5-1.5 MG/5ML syrup; Take 5 mLs by mouth every 8 (eight) hours as needed for cough.   Azithromycin and as needed Hycodan to help with sleep. She also will receive a 80 mg shot of Depo-Medrol today due to her self reported wheezing at home.    Return if symptoms worsen or fail to improve.

## 2016-01-28 DIAGNOSIS — H2513 Age-related nuclear cataract, bilateral: Secondary | ICD-10-CM | POA: Diagnosis not present

## 2016-02-01 ENCOUNTER — Other Ambulatory Visit: Payer: Self-pay | Admitting: Family Medicine

## 2016-02-06 ENCOUNTER — Encounter: Payer: Self-pay | Admitting: Emergency Medicine

## 2016-02-06 ENCOUNTER — Emergency Department
Admission: EM | Admit: 2016-02-06 | Discharge: 2016-02-06 | Discharge status: Absent without leave | Payer: Medicare HMO | Source: Home / Self Care | Attending: Family Medicine | Admitting: Family Medicine

## 2016-02-06 NOTE — ED Notes (Signed)
Pt c/o URI, low grade fever all week, productive cough, no ear pain

## 2016-02-08 ENCOUNTER — Ambulatory Visit: Payer: Medicare HMO | Admitting: Family Medicine

## 2016-02-08 ENCOUNTER — Ambulatory Visit (INDEPENDENT_AMBULATORY_CARE_PROVIDER_SITE_OTHER): Payer: Medicare HMO | Admitting: Family Medicine

## 2016-02-08 ENCOUNTER — Other Ambulatory Visit: Payer: Self-pay | Admitting: Family Medicine

## 2016-02-08 ENCOUNTER — Encounter: Payer: Self-pay | Admitting: Family Medicine

## 2016-02-08 VITALS — BP 132/85 | HR 86 | Temp 98.4°F | Wt 129.0 lb

## 2016-02-08 DIAGNOSIS — A499 Bacterial infection, unspecified: Secondary | ICD-10-CM | POA: Diagnosis not present

## 2016-02-08 DIAGNOSIS — B9689 Other specified bacterial agents as the cause of diseases classified elsewhere: Secondary | ICD-10-CM

## 2016-02-08 DIAGNOSIS — J329 Chronic sinusitis, unspecified: Secondary | ICD-10-CM

## 2016-02-08 MED ORDER — CEFDINIR 300 MG PO CAPS: 300.0000 mg | ORAL_CAPSULE | Freq: Two times a day (BID) | ORAL | Status: AC

## 2016-02-08 MED ORDER — HYDROCODONE-HOMATROPINE 5-1.5 MG/5ML PO SYRP: 5.0000 mL | ORAL_SOLUTION | Freq: Three times a day (TID) | ORAL | Status: DC | PRN

## 2016-02-08 NOTE — Progress Notes (Signed)
CC: Margaret [name] is a 70 y.o. female is here for URI   Subjective: HPI:  Facial pressure in the forehead, nasal congestion, postnasal drip and nonproductive cough. Symptoms have been present since Tuesday of last week. No benefit from Robitussin or Mucinex. Accompanied by shortness of breath but this is improved with taking albuterol every 6 hours. Denies fevers, chills, chest discomfort or confusion.   Review Of Systems Outlined In HPI  Past Medical History  Diagnosis Date  . Asthma   . Hypertension     Past Surgical History  Procedure Laterality Date  . Joint replacement      LT KNEE  . Abdominal hysterectomy    . Total knee arthroplasty  2006   Family History  Problem Relation Age of Onset  . Emphysema Mother   . Stroke Other     Social History   Social History  . Marital Status: Married    Spouse Name: N/A  . Number of Children: N/A  . Years of Education: N/A   Occupational History  . Not on file.   Social History Main Topics  . Smoking status: Former Smoker    Quit date: 02/01/2008  . Smokeless tobacco: Not on file  . Alcohol Use: Yes     Comment: SOCIALLY  . Drug Use: No  . Sexual Activity: Yes   Other Topics Concern  . Not on file   Social History Narrative   ** Merged History Encounter **         Objective: BP 132/85 mmHg  Pulse 86  Temp(Src) 98.4 F (36.9 C) (Oral)  Wt 129 lb (58.514 kg)  SpO2 96%  General: Alert and Oriented, No Acute Distress HEENT: Pupils equal, round, reactive to light. Conjunctivae clear.  External ears unremarkable, canals clear with intact TMs with appropriate landmarks.  Middle ear appears open without effusion. Pink inferior turbinates.  Moist mucous membranes, pharynx without inflammation nor lesions.  Neck supple without palpable lymphadenopathy nor abnormal masses. Lungs: Clear to auscultation bilaterally, no wheezing/ronchi/rales.  Comfortable work of breathing. Good air movement. Cardiac: Regular rate and  rhythm. Normal S1/S2.  No murmurs, rubs, nor gallops.   Mental Status: No depression, anxiety, nor agitation. Skin: Warm and dry.  Assessment & Plan: Margaret [name] was seen today for uri.  Diagnoses and all orders for this visit:  Bacterial sinusitis -     HYDROcodone-homatropine (HYCODAN) 5-1.5 MG/5ML syrup; Take 5 mLs by mouth every 8 (eight) hours as needed for cough. -     cefdinir (OMNICEF) 300 MG capsule; Take 1 capsule (300 mg total) by mouth 2 (two) times daily.   bacterial  sinusitis: Start Hycodan and Omnicef. Call if no better by Wednesday, next step would be adding prednisone.   No Follow-up on file. Facial pressure facial pressure

## 2016-03-01 DIAGNOSIS — R911 Solitary pulmonary nodule: Secondary | ICD-10-CM | POA: Diagnosis not present

## 2016-03-01 DIAGNOSIS — J4530 Mild persistent asthma, uncomplicated: Secondary | ICD-10-CM | POA: Diagnosis not present

## 2016-03-01 DIAGNOSIS — R079 Chest pain, unspecified: Secondary | ICD-10-CM | POA: Diagnosis not present

## 2016-03-01 DIAGNOSIS — J439 Emphysema, unspecified: Secondary | ICD-10-CM | POA: Diagnosis not present

## 2016-03-10 DIAGNOSIS — J984 Other disorders of lung: Secondary | ICD-10-CM | POA: Diagnosis not present

## 2016-03-10 DIAGNOSIS — R911 Solitary pulmonary nodule: Secondary | ICD-10-CM | POA: Diagnosis not present

## 2016-03-10 DIAGNOSIS — J439 Emphysema, unspecified: Secondary | ICD-10-CM | POA: Diagnosis not present

## 2016-03-10 DIAGNOSIS — R918 Other nonspecific abnormal finding of lung field: Secondary | ICD-10-CM | POA: Diagnosis not present

## 2016-03-25 DIAGNOSIS — R911 Solitary pulmonary nodule: Secondary | ICD-10-CM | POA: Diagnosis not present

## 2016-03-25 DIAGNOSIS — K449 Diaphragmatic hernia without obstruction or gangrene: Secondary | ICD-10-CM | POA: Diagnosis not present

## 2016-03-30 ENCOUNTER — Encounter: Payer: Self-pay | Admitting: Family Medicine

## 2016-03-30 ENCOUNTER — Ambulatory Visit (INDEPENDENT_AMBULATORY_CARE_PROVIDER_SITE_OTHER): Payer: Medicare HMO | Admitting: Family Medicine

## 2016-03-30 VITALS — BP 123/75 | HR 70 | Wt 130.0 lb

## 2016-03-30 DIAGNOSIS — E041 Nontoxic single thyroid nodule: Secondary | ICD-10-CM

## 2016-03-30 DIAGNOSIS — I10 Essential (primary) hypertension: Secondary | ICD-10-CM | POA: Diagnosis not present

## 2016-03-30 DIAGNOSIS — R911 Solitary pulmonary nodule: Secondary | ICD-10-CM | POA: Insufficient documentation

## 2016-03-30 LAB — TSH: TSH: 2.3 mIU/L

## 2016-03-30 MED ORDER — LINACLOTIDE 145 MCG PO CAPS: 145.0000 ug | ORAL_CAPSULE | Freq: Every day | ORAL | Status: DC

## 2016-03-30 MED ORDER — AMLODIPINE BESYLATE 10 MG PO TABS: 10.0000 mg | ORAL_TABLET | Freq: Every day | ORAL | Status: DC

## 2016-03-30 MED ORDER — LEVOTHYROXINE SODIUM 50 MCG PO TABS: 50.0000 ug | ORAL_TABLET | Freq: Every day | ORAL | Status: DC

## 2016-03-30 NOTE — Progress Notes (Signed)
CC: Margaret [name] is a 70 y.o. female is here for Hypothyroidism and Medication Refill   Subjective: HPI:  Recently had a CT scan showing a solitary pulmonary nodule and had a follow-up PET scan that was unremarkable however the radiologist commented that her nodule is too small, 6 mm in diameter, to evaluate with PET scan. Patient under the impression that she needs a repeat CT scan in 3 months from now. She has some breathing issues when the pollen count was high however today she tells me her breathing is just fine.  During the CT scan evaluation she had an incidental thyroid nodule remember correctly was 1.3 cm in diameter and in the right lobe. She denies any change in voice, cough, nor difficulty swallowing. She stated taking 50 MCG of levothyroxine on a daily basis and tells me she feels fatigued most mornings out of the week. She denies any hair or skin complaints nor any gastrointestinal complaints.  Follow-up essential hypertension: Taking amlodipine on a daily basis and requesting refills but still has enough Cozaar. No outside blood pressures report. No chest pain shortness of breath orthopnea nor peripheral edema   Review Of Systems Outlined In HPI  Past Medical History  Diagnosis Date  . Asthma   . Hypertension     Past Surgical History  Procedure Laterality Date  . Joint replacement      LT KNEE  . Abdominal hysterectomy    . Total knee arthroplasty  2006   Family History  Problem Relation Age of Onset  . Emphysema Mother   . Stroke Other     Social History   Social History  . Marital Status: Married    Spouse Name: N/A  . Number of Children: N/A  . Years of Education: N/A   Occupational History  . Not on file.   Social History Main Topics  . Smoking status: Former Smoker    Quit date: 02/01/2008  . Smokeless tobacco: Not on file  . Alcohol Use: Yes     Comment: SOCIALLY  . Drug Use: No  . Sexual Activity: Yes   Other Topics Concern  . Not on file    Social History Narrative   ** Merged History Encounter **         Objective: BP 123/75 mmHg  Pulse 70  Wt 130 lb (58.968 kg)  General: Alert and Oriented, No Acute Distress HEENT: Pupils equal, round, reactive to light. Conjunctivae clear.    Moist mucous membranes, pharynx without inflammation nor lesions.  Neck supple without palpable lymphadenopathy nor abnormal masses. Lungs: Clear to auscultation bilaterally, no wheezing/ronchi/rales.  Comfortable work of breathing. Good air movement. Cardiac: Regular rate and rhythm. Normal S1/S2.  No murmurs, rubs, nor gallops.   Extremities: No peripheral edema.  Strong peripheral pulses.  Mental Status: No depression, anxiety, nor agitation. Skin: Warm and dry.  Assessment & Plan: Chanti was seen today for hypothyroidism and medication refill.  Diagnoses and all orders for this visit:  Thyroid nodule -     levothyroxine (SYNTHROID, LEVOTHROID) 50 MCG tablet; Take 1 tablet (50 mcg total) by mouth daily before breakfast. -     US THYROID -     TSH  Essential hypertension, benign -     amLODipine (NORVASC) 10 MG tablet; Take 1 tablet (10 mg total) by mouth daily.  Solitary pulmonary nodule  Other orders -     linaclotide (LINZESS) 145 MCG CAPS capsule; Take 1 capsule (145 mcg total) by mouth daily.  To help with constipation.  Thyroid nodule in the setting of hypothyroidism with recent fatigue warrants TSH and dedicated ultrasound of the thyroid itself, prepare that she may need a biopsy in the near future. Essential hypertension: Controlled with amlodipine and losartan Solitary pulmonary nodule: Were both under the understanding that her pulmonologist for follow-up on this however admit a note in her chart if she's not had a CT scan by the end of July be happy to help order this Return in about 3 months (around 06/29/2016).

## 2016-03-31 ENCOUNTER — Telehealth: Payer: Self-pay | Admitting: Family Medicine

## 2016-03-31 ENCOUNTER — Ambulatory Visit (INDEPENDENT_AMBULATORY_CARE_PROVIDER_SITE_OTHER): Payer: Medicare HMO

## 2016-03-31 ENCOUNTER — Other Ambulatory Visit: Payer: Self-pay

## 2016-03-31 ENCOUNTER — Other Ambulatory Visit: Payer: Medicare HMO

## 2016-03-31 DIAGNOSIS — E041 Nontoxic single thyroid nodule: Secondary | ICD-10-CM

## 2016-03-31 MED ORDER — PANTOPRAZOLE SODIUM 40 MG PO TBEC: 40.0000 mg | DELAYED_RELEASE_TABLET | Freq: Every day | ORAL | Status: DC

## 2016-03-31 NOTE — Telephone Encounter (Signed)
Left vm for pt to return call  

## 2016-03-31 NOTE — Telephone Encounter (Signed)
Will you please let patient know that her thyroid ultrasound confirmed the presence of a nodule but was inconclusive to determine if it is a threat to her heath.  The next step in workup involves a thyroid biopsy, I've put in a referral for [facility] to call her about scheduling this.

## 2016-03-31 NOTE — Telephone Encounter (Signed)
Notified patient of results and referral to [facility] imaging.

## 2016-04-05 DIAGNOSIS — J479 Bronchiectasis, uncomplicated: Secondary | ICD-10-CM | POA: Diagnosis not present

## 2016-04-05 DIAGNOSIS — J4530 Mild persistent asthma, uncomplicated: Secondary | ICD-10-CM | POA: Diagnosis not present

## 2016-04-05 DIAGNOSIS — J439 Emphysema, unspecified: Secondary | ICD-10-CM | POA: Diagnosis not present

## 2016-04-05 DIAGNOSIS — R918 Other nonspecific abnormal finding of lung field: Secondary | ICD-10-CM | POA: Diagnosis not present

## 2016-04-15 DIAGNOSIS — E041 Nontoxic single thyroid nodule: Secondary | ICD-10-CM | POA: Diagnosis not present

## 2016-04-19 ENCOUNTER — Encounter: Payer: Self-pay | Admitting: Family Medicine

## 2016-04-25 ENCOUNTER — Ambulatory Visit: Payer: Medicare PPO | Admitting: Family Medicine

## 2016-04-30 ENCOUNTER — Other Ambulatory Visit: Payer: Self-pay | Admitting: Family Medicine

## 2016-05-04 ENCOUNTER — Other Ambulatory Visit: Payer: Self-pay | Admitting: Family Medicine

## 2016-07-03 ENCOUNTER — Other Ambulatory Visit: Payer: Self-pay | Admitting: Family Medicine

## 2016-07-08 ENCOUNTER — Ambulatory Visit (INDEPENDENT_AMBULATORY_CARE_PROVIDER_SITE_OTHER): Payer: Medicare HMO | Admitting: Family Medicine

## 2016-07-08 ENCOUNTER — Encounter: Payer: Self-pay | Admitting: Family Medicine

## 2016-07-08 VITALS — BP 142/91 | HR 76 | Wt 123.0 lb

## 2016-07-08 DIAGNOSIS — Z23 Encounter for immunization: Secondary | ICD-10-CM

## 2016-07-08 DIAGNOSIS — R911 Solitary pulmonary nodule: Secondary | ICD-10-CM

## 2016-07-08 DIAGNOSIS — E039 Hypothyroidism, unspecified: Secondary | ICD-10-CM | POA: Diagnosis not present

## 2016-07-08 DIAGNOSIS — Z1239 Encounter for other screening for malignant neoplasm of breast: Secondary | ICD-10-CM

## 2016-07-08 MED ORDER — ZOSTER VACCINE LIVE 19400 UNT/0.65ML ~~LOC~~ SUSR: 0.6500 mL | Freq: Once | SUBCUTANEOUS | 0 refills | Status: AC

## 2016-07-08 MED ORDER — FLUOXETINE HCL 40 MG PO CAPS: 80.0000 mg | ORAL_CAPSULE | Freq: Every day | ORAL | 1 refills | Status: DC

## 2016-07-08 NOTE — Progress Notes (Signed)
CC: Katisha Duddy is a 70 y.o. female is here for Hypothyroidism and Hypertension   Subjective: HPI:  A little over 6 months ago she had a CT scan that showed a new pulmonary nodule in the [facility] system. She's due to have this rechecked. She denies any cardiopulmonary symptoms such as cough, shortness of breath wheezing or chest discomfort.  She would like a repeat mammogram, she denies any breast changes or complaints. Has been about a year since her last mammogram.  Follow-up hyperthyroidism: For the past month she still symptoms of cold intolerance. She denies any unintentional weight loss or gain or any gastrointestinal complaints. She is taking levothyroxine with 100% compliance.   Review Of Systems Outlined In HPI  Past Medical History:  Diagnosis Date  . Asthma   . Hypertension     Past Surgical History:  Procedure Laterality Date  . ABDOMINAL HYSTERECTOMY    . JOINT REPLACEMENT     LT KNEE  . TOTAL KNEE ARTHROPLASTY  2006   Family History  Problem Relation Age of Onset  . Emphysema Mother   . Stroke Other     Social History   Social History  . Marital status: Married    Spouse name: N/A  . Number of children: N/A  . Years of education: N/A   Occupational History  . Not on file.   Social History Main Topics  . Smoking status: Former Smoker    Quit date: 02/01/2008  . Smokeless tobacco: Not on file  . Alcohol use Yes     Comment: SOCIALLY  . Drug use: No  . Sexual activity: Yes   Other Topics Concern  . Not on file   Social History Narrative   ** Merged History Encounter **         Objective: BP (!) 142/91   Pulse 76   Wt 123 lb (55.8 kg)   BMI 22.50 kg/m   General: Alert and Oriented, No Acute Distress HEENT: Pupils equal, round, reactive to light. Conjunctivae clear.  Moist mucous membranes Lungs: Clear to auscultation bilaterally, no wheezing/ronchi/rales.  Comfortable work of breathing. Good air movement. Cardiac: Regular rate and rhythm.  Normal S1/S2.  No murmurs, rubs, nor gallops.   Extremities: No peripheral edema.  Strong peripheral pulses.  Mental Status: No depression, anxiety, nor agitation. Skin: Warm and dry.  Assessment & Plan: Kaylei was seen today for hypothyroidism and hypertension.  Diagnoses and all orders for this visit:  Solitary pulmonary nodule -     CT CHEST W CONTRAST  Screening for malignant neoplasm of breast -     MM DIGITAL SCREENING BILATERAL; Future  Hypothyroidism, unspecified hypothyroidism type -     TSH  Need for prophylactic vaccination against Streptococcus pneumoniae (pneumococcus) -     Pneumococcal conjugate vaccine 13-valent  Other orders -     Zoster Vaccine Live, PF, (ZOSTAVAX) 60454 UNT/0.65ML injection; Inject 19,400 Units into the skin once. -     FLUoxetine (PROZAC) 40 MG capsule; Take 2 capsules (80 mg total) by mouth daily.   CT scan has been ordered and will be arranged in the [facility] system to aid in comparison of her prior images. Mammogram has been ordered Checking TSH due to recent cold intolerance She wants know she go up on her Prozac, she feels like her son is causing her worsening anxiety ever since he moved in. Increasing Prozac to 80 mg daily message me in my chart of no benefit in 2 weeks  Discussed  with this patient that I will be resigning from my position here with [facility] [employer] [facility] in September in order to stay with my family who will be moving to [facility]. I let him know about the providers that are still accepting patients and I feel that this individual will be under great care if he/she stays here with [facility].   No Follow-up on file.

## 2016-07-09 LAB — TSH: TSH: 1.75 mIU/L

## 2016-07-11 ENCOUNTER — Telehealth: Payer: Self-pay | Admitting: Family Medicine

## 2016-07-11 DIAGNOSIS — E041 Nontoxic single thyroid nodule: Secondary | ICD-10-CM

## 2016-07-11 DIAGNOSIS — R911 Solitary pulmonary nodule: Secondary | ICD-10-CM

## 2016-07-11 MED ORDER — LEVOTHYROXINE SODIUM 50 MCG PO TABS: 50.0000 ug | ORAL_TABLET | Freq: Every day | ORAL | 1 refills | Status: DC

## 2016-07-11 NOTE — Telephone Encounter (Signed)
Results left on vm

## 2016-07-11 NOTE — Telephone Encounter (Signed)
Will you please let patient know that I got the date of her last CT scan wrong and she actually does not need a CT scan until October.  She can disregard the CT scan order placed last week.

## 2016-07-11 NOTE — Telephone Encounter (Signed)
Left message for patient that CT scan is not needed until October, and to call office if questions.

## 2016-07-11 NOTE — Telephone Encounter (Signed)
Will you please let patient know that her thyroid supplement appears to be adequately dose, I will send a refill to [facility].

## 2016-07-26 ENCOUNTER — Ambulatory Visit: Payer: Medicare HMO | Admitting: Osteopathic Medicine

## 2016-08-09 DIAGNOSIS — R911 Solitary pulmonary nodule: Secondary | ICD-10-CM | POA: Diagnosis not present

## 2016-08-18 ENCOUNTER — Other Ambulatory Visit: Payer: Self-pay

## 2016-08-18 DIAGNOSIS — I10 Essential (primary) hypertension: Secondary | ICD-10-CM

## 2016-08-18 MED ORDER — LOSARTAN POTASSIUM 100 MG PO TABS: 100.0000 mg | ORAL_TABLET | Freq: Every day | ORAL | 0 refills | Status: DC

## 2016-08-23 DIAGNOSIS — M25552 Pain in left hip: Secondary | ICD-10-CM | POA: Diagnosis not present

## 2016-08-23 DIAGNOSIS — Z79899 Other long term (current) drug therapy: Secondary | ICD-10-CM | POA: Diagnosis not present

## 2016-08-23 DIAGNOSIS — Z5181 Encounter for therapeutic drug level monitoring: Secondary | ICD-10-CM | POA: Diagnosis not present

## 2016-08-23 DIAGNOSIS — M545 Low back pain: Secondary | ICD-10-CM | POA: Diagnosis not present

## 2016-08-23 DIAGNOSIS — M25551 Pain in right hip: Secondary | ICD-10-CM | POA: Diagnosis not present

## 2016-08-23 DIAGNOSIS — G894 Chronic pain syndrome: Secondary | ICD-10-CM | POA: Diagnosis not present

## 2016-09-20 ENCOUNTER — Other Ambulatory Visit: Payer: Self-pay | Admitting: *Deleted

## 2016-09-20 DIAGNOSIS — M25552 Pain in left hip: Secondary | ICD-10-CM | POA: Diagnosis not present

## 2016-09-20 DIAGNOSIS — G894 Chronic pain syndrome: Secondary | ICD-10-CM | POA: Diagnosis not present

## 2016-09-20 DIAGNOSIS — M75111 Incomplete rotator cuff tear or rupture of right shoulder, not specified as traumatic: Secondary | ICD-10-CM | POA: Diagnosis not present

## 2016-09-20 DIAGNOSIS — M25551 Pain in right hip: Secondary | ICD-10-CM | POA: Diagnosis not present

## 2016-09-20 MED ORDER — FLUOXETINE HCL 40 MG PO CAPS: 80.0000 mg | ORAL_CAPSULE | Freq: Every day | ORAL | 0 refills | Status: DC

## 2016-10-12 ENCOUNTER — Other Ambulatory Visit: Payer: Self-pay | Admitting: Osteopathic Medicine

## 2016-10-12 ENCOUNTER — Ambulatory Visit: Payer: Medicare HMO | Admitting: Osteopathic Medicine

## 2016-10-12 DIAGNOSIS — I10 Essential (primary) hypertension: Secondary | ICD-10-CM

## 2016-10-13 ENCOUNTER — Other Ambulatory Visit: Payer: Self-pay

## 2016-10-13 DIAGNOSIS — I10 Essential (primary) hypertension: Secondary | ICD-10-CM

## 2016-10-13 MED ORDER — AMLODIPINE BESYLATE 10 MG PO TABS: 10.0000 mg | ORAL_TABLET | Freq: Every day | ORAL | 0 refills | Status: DC

## 2016-10-17 DIAGNOSIS — M75111 Incomplete rotator cuff tear or rupture of right shoulder, not specified as traumatic: Secondary | ICD-10-CM | POA: Diagnosis not present

## 2016-10-19 ENCOUNTER — Other Ambulatory Visit: Payer: Self-pay | Admitting: Osteopathic Medicine

## 2016-10-19 DIAGNOSIS — I10 Essential (primary) hypertension: Secondary | ICD-10-CM

## 2016-10-19 MED ORDER — AMLODIPINE BESYLATE 10 MG PO TABS: 10.0000 mg | ORAL_TABLET | Freq: Every day | ORAL | 0 refills | Status: DC

## 2016-10-19 MED ORDER — FLUOXETINE HCL 40 MG PO CAPS: 80.0000 mg | ORAL_CAPSULE | Freq: Every day | ORAL | 0 refills | Status: DC

## 2016-10-31 ENCOUNTER — Ambulatory Visit: Payer: Medicare HMO | Admitting: Osteopathic Medicine

## 2016-11-03 ENCOUNTER — Encounter: Payer: Self-pay | Admitting: Osteopathic Medicine

## 2016-11-03 ENCOUNTER — Ambulatory Visit (INDEPENDENT_AMBULATORY_CARE_PROVIDER_SITE_OTHER): Payer: Medicare HMO | Admitting: Osteopathic Medicine

## 2016-11-03 VITALS — BP 113/78 | HR 73 | Ht 61.5 in | Wt 127.0 lb

## 2016-11-03 DIAGNOSIS — E039 Hypothyroidism, unspecified: Secondary | ICD-10-CM

## 2016-11-03 DIAGNOSIS — R911 Solitary pulmonary nodule: Secondary | ICD-10-CM | POA: Diagnosis not present

## 2016-11-03 DIAGNOSIS — F329 Major depressive disorder, single episode, unspecified: Secondary | ICD-10-CM

## 2016-11-03 DIAGNOSIS — I10 Essential (primary) hypertension: Secondary | ICD-10-CM

## 2016-11-03 DIAGNOSIS — F418 Other specified anxiety disorders: Secondary | ICD-10-CM

## 2016-11-03 DIAGNOSIS — Z23 Encounter for immunization: Secondary | ICD-10-CM

## 2016-11-03 DIAGNOSIS — E041 Nontoxic single thyroid nodule: Secondary | ICD-10-CM

## 2016-11-03 DIAGNOSIS — R69 Illness, unspecified: Secondary | ICD-10-CM | POA: Diagnosis not present

## 2016-11-03 DIAGNOSIS — K5909 Other constipation: Secondary | ICD-10-CM

## 2016-11-03 DIAGNOSIS — G8929 Other chronic pain: Secondary | ICD-10-CM | POA: Insufficient documentation

## 2016-11-03 DIAGNOSIS — F419 Anxiety disorder, unspecified: Secondary | ICD-10-CM

## 2016-11-03 DIAGNOSIS — F32A Depression, unspecified: Secondary | ICD-10-CM

## 2016-11-03 MED ORDER — FLUOXETINE HCL 40 MG PO CAPS: 80.0000 mg | ORAL_CAPSULE | Freq: Every day | ORAL | 3 refills | Status: DC

## 2016-11-03 MED ORDER — LOSARTAN POTASSIUM 100 MG PO TABS: 100.0000 mg | ORAL_TABLET | Freq: Every day | ORAL | 3 refills | Status: DC

## 2016-11-03 MED ORDER — AMLODIPINE BESYLATE 10 MG PO TABS: 10.0000 mg | ORAL_TABLET | Freq: Every day | ORAL | 3 refills | Status: DC

## 2016-11-03 MED ORDER — LINACLOTIDE 145 MCG PO CAPS: 145.0000 ug | ORAL_CAPSULE | Freq: Every day | ORAL | 3 refills | Status: DC

## 2016-11-03 NOTE — Patient Instructions (Signed)
Plan for blood draw sometime in the week prior to your annual physical. That way, we can discuss results in detail at your visit. Please let us know if there are any questions or anything else we can do for you in the meantime!

## 2016-11-03 NOTE — Progress Notes (Signed)
HPI: Margaret [name] is a 70 y.o. female  who presents to [facility] today, 11/03/16,  for chief complaint of:  Chief Complaint  Patient presents with  . Establish Care    Switching from [place]    Solitary pulmonary nodule:  Serial chest CT shows improvement. Following with pulmonology, Dr Lourdes Sledge. Patient is unsure of next follow-up appointment.  Hx thyroid nodule: incidental finding, biopsy benign in 04/2016.   Hypothyroidism: Recent TSH normal, stable on current dose medication.  Anxiety and depression: depressed from chronic pain. Doing well on current dose fluoxetine.  Essential hypertension, benign: No chest pain, pressure, shortness of breath. Blood pressure controlled on medications as below.  Chronic pain: following with specialist, is also in study involving TENS unit-like device/permanent implanted electrode. Following with pain management and orthopedics.  Chronic constipation: Doing well on Linzess.   Past medical, surgical, social and family history reviewed: Patient Active Problem List   Diagnosis Date Noted  . Thyroid nodule 03/30/2016  . Solitary pulmonary nodule 03/30/2016  . Strain of gluteus medius 10/07/2015  . Anxiety and depression 09/29/2015  . Hip pain, acute 08/31/2015  . Vitamin D insufficiency 04/08/2015  . Elevated vitamin B12 level 04/08/2015  . Hirsutism 01/28/2015  . Right knee DJD 12/17/2014  . Osteoarthritis of right knee 11/04/2014  . Pain in right hip 08/21/2014  . Hair loss 08/21/2014  . Hypothyroidism 07/24/2014  . Vasomotor flushing 03/05/2014  . Intrinsic asthma 12/17/2013  . Spinal stenosis of lumbar region 10/14/2013  . Coffee ground emesis 03/06/2013  . Disorders of bursae and tendons in shoulder region, unspecified (LEFT) 03/01/2013  . Anal fissure 02/22/2013  . Diverticulosis of colon without hemorrhage 02/22/2013  . Hyperglycemia 02/01/2013  . Essential hypertension, benign  01/31/2013  . CAP (community acquired pneumonia) 01/31/2013  . Osteoporosis 01/31/2013  . HSV infection 01/31/2013   Past Surgical History:  Procedure Laterality Date  . ABDOMINAL HYSTERECTOMY    . JOINT REPLACEMENT     LT KNEE  . TOTAL KNEE ARTHROPLASTY  2006   Social History  Substance Use Topics  . Smoking status: Former Smoker    Quit date: 02/01/2008  . Smokeless tobacco: Never Used  . Alcohol use Yes     Comment: SOCIALLY   Family History  Problem Relation Age of Onset  . Stroke Other   . Emphysema Mother      Current medication list and allergy/intolerance information reviewed:   Current Outpatient Prescriptions on File Prior to Visit  Medication Sig Dispense Refill  . amLODipine (NORVASC) 10 MG tablet Take 1 tablet (10 mg total) by mouth daily. Please keep up coming appointment. 30 tablet 0  . FLUoxetine (PROZAC) 40 MG capsule Take 2 capsules (80 mg total) by mouth daily. Please schedule a follow up appointment 60 capsule 0  . levothyroxine (SYNTHROID, LEVOTHROID) 50 MCG tablet Take 1 tablet (50 mcg total) by mouth daily before breakfast. 90 tablet 1  . linaclotide (LINZESS) 145 MCG CAPS capsule Take 1 capsule (145 mcg total) by mouth daily. To help with constipation. 30 capsule 3  . losartan (COZAAR) 100 MG tablet Take 1 tablet (100 mg total) by mouth daily. MUST KEEP FOLLOW UP APPOINTMENT 30 tablet 0  . meloxicam (MOBIC) 15 MG tablet Take 15 mg by mouth daily.    [name] morphine (MSIR) 15 MG tablet Take 15 mg by mouth every 4 (four) hours as needed for severe pain.    [name] SPIRIVA RESPIMAT 2.5 MCG/ACT AERS  USE AS DIRECTED 4 g 0   No current facility-administered medications on file prior to visit.    Allergies  Allergen Reactions  . Evista [Raloxifene]     Back pain  . Floxin [Ofloxacin] Hives  . Neosporin [Neomycin-Polymyxin-Gramicidin] Hives  . Other     ANTIBIOTIC OINT - UNSURE OF NAME   . Tetracyclines & Related       Review of Systems:  Constitutional: No  recent illness  HEENT: No  headache, no vision change  Cardiac: No  chest pain, No  pressure, No palpitations  Respiratory:  No  shortness of breath. No  Cough  Gastrointestinal: No  abdominal pain, no change on bowel habits  Musculoskeletal: No new myalgia/arthralgia  Skin: No  Rash  Neurologic: No  weakness, No  Dizziness  Psychiatric: No  concerns with depression, No  concerns with anxiety  Exam:  BP 113/78   Pulse 73   Ht 5' 1.5" (1.562 m)   Wt 127 lb (57.6 kg)   BMI 23.61 kg/m   Constitutional: VS see above. General Appearance: alert, well-developed, well-nourished, NAD  Eyes: Normal lids and conjunctive, non-icteric sclera  Ears, Nose, Mouth, Throat: MMM, Normal external inspection ears/nares/mouth/lips/gums.  Neck: No masses, trachea midline.   Respiratory: Normal respiratory effort. no wheeze, no rhonchi, no rales  Cardiovascular: S1/S2 normal, no murmur, no rub/gallop auscultated. RRR.   Musculoskeletal: Gait normal. Symmetric and independent movement of all extremities  Neurological: Normal balance/coordination. No tremor.  Skin: warm, dry, intact.   Psychiatric: Normal judgment/insight. Normal mood and affect. Oriented x3.    Immunization History  Administered Date(s) Administered  . Influenza,inj,Quad PF,36+ Mos 10/14/2013, 11/04/2014, 08/18/2015, 11/03/2016  . Pneumococcal Conjugate-13 07/08/2016  . Pneumococcal-Unspecified 09/04/2012  . Tdap 10/31/2011     ASSESSMENT/PLAN:   Essential hypertension, benign - Plan: CBC with Differential/Platelet, COMPLETE METABOLIC PANEL WITH GFR, Lipid panel, amLODipine (NORVASC) 10 MG tablet, losartan (COZAAR) 100 MG tablet  Solitary pulmonary nodule - Advise call pulmonology to confirm follow-up  Anxiety and depression - Stable on Prozac, plan to continue - Plan: FLUoxetine (PROZAC) 40 MG capsule  Hypothyroidism, unspecified type - Plan: TSH  Thyroid nodule  Chronic constipation - Plan: linaclotide  (LINZESS) 145 MCG CAPS capsule  Other chronic pain  Need for prophylactic vaccination and inoculation against influenza - Plan: Flu Vaccine QUAD 36+ mos IM    Patient Instructions  Plan for blood draw sometime in the week prior to your annual physical. That way, we can discuss results in detail at your visit. Please let us know if there are any questions or anything else we can do for you in the meantime!     Visit summary with medication list and pertinent instructions was printed for patient to review. All questions at time of visit were answered - patient instructed to contact office with any additional concerns. ER/RTC precautions were reviewed with the patient. Follow-up plan: Return for ANNUAL PHYSICAL 3 - 4 months, sooner if needed. [name]

## 2016-11-07 DIAGNOSIS — M75111 Incomplete rotator cuff tear or rupture of right shoulder, not specified as traumatic: Secondary | ICD-10-CM | POA: Diagnosis not present

## 2016-11-07 DIAGNOSIS — I10 Essential (primary) hypertension: Secondary | ICD-10-CM | POA: Diagnosis not present

## 2016-11-07 DIAGNOSIS — M75121 Complete rotator cuff tear or rupture of right shoulder, not specified as traumatic: Secondary | ICD-10-CM | POA: Insufficient documentation

## 2016-11-07 DIAGNOSIS — S46011A Strain of muscle(s) and tendon(s) of the rotator cuff of right shoulder, initial encounter: Secondary | ICD-10-CM | POA: Diagnosis not present

## 2016-11-07 DIAGNOSIS — K219 Gastro-esophageal reflux disease without esophagitis: Secondary | ICD-10-CM | POA: Diagnosis not present

## 2016-11-07 DIAGNOSIS — M19011 Primary osteoarthritis, right shoulder: Secondary | ICD-10-CM | POA: Diagnosis not present

## 2016-11-07 DIAGNOSIS — G8918 Other acute postprocedural pain: Secondary | ICD-10-CM | POA: Diagnosis not present

## 2016-11-07 DIAGNOSIS — Z881 Allergy status to other antibiotic agents status: Secondary | ICD-10-CM | POA: Diagnosis not present

## 2016-11-07 DIAGNOSIS — J449 Chronic obstructive pulmonary disease, unspecified: Secondary | ICD-10-CM | POA: Diagnosis not present

## 2016-11-07 DIAGNOSIS — M7541 Impingement syndrome of right shoulder: Secondary | ICD-10-CM | POA: Diagnosis not present

## 2016-11-07 DIAGNOSIS — S43431A Superior glenoid labrum lesion of right shoulder, initial encounter: Secondary | ICD-10-CM | POA: Diagnosis not present

## 2016-11-08 DIAGNOSIS — Z888 Allergy status to other drugs, medicaments and biological substances status: Secondary | ICD-10-CM | POA: Diagnosis not present

## 2016-11-08 DIAGNOSIS — K219 Gastro-esophageal reflux disease without esophagitis: Secondary | ICD-10-CM | POA: Diagnosis not present

## 2016-11-08 DIAGNOSIS — Z882 Allergy status to sulfonamides status: Secondary | ICD-10-CM | POA: Diagnosis not present

## 2016-11-08 DIAGNOSIS — J449 Chronic obstructive pulmonary disease, unspecified: Secondary | ICD-10-CM | POA: Diagnosis not present

## 2016-11-08 DIAGNOSIS — M1990 Unspecified osteoarthritis, unspecified site: Secondary | ICD-10-CM | POA: Diagnosis not present

## 2016-11-08 DIAGNOSIS — Z87891 Personal history of nicotine dependence: Secondary | ICD-10-CM | POA: Diagnosis not present

## 2016-11-08 DIAGNOSIS — G8918 Other acute postprocedural pain: Secondary | ICD-10-CM | POA: Diagnosis not present

## 2016-11-08 DIAGNOSIS — Z9889 Other specified postprocedural states: Secondary | ICD-10-CM | POA: Diagnosis not present

## 2016-11-08 DIAGNOSIS — Z8542 Personal history of malignant neoplasm of other parts of uterus: Secondary | ICD-10-CM | POA: Diagnosis not present

## 2016-11-08 DIAGNOSIS — M25511 Pain in right shoulder: Secondary | ICD-10-CM | POA: Diagnosis not present

## 2016-11-08 DIAGNOSIS — I10 Essential (primary) hypertension: Secondary | ICD-10-CM | POA: Diagnosis not present

## 2016-11-08 DIAGNOSIS — Z883 Allergy status to other anti-infective agents status: Secondary | ICD-10-CM | POA: Diagnosis not present

## 2016-11-09 ENCOUNTER — Ambulatory Visit: Payer: Medicare HMO | Admitting: Rehabilitative and Restorative Service Providers"

## 2016-11-23 ENCOUNTER — Encounter: Payer: Self-pay | Admitting: Rehabilitative and Restorative Service Providers"

## 2016-11-23 ENCOUNTER — Ambulatory Visit (INDEPENDENT_AMBULATORY_CARE_PROVIDER_SITE_OTHER): Payer: Medicare HMO | Admitting: Rehabilitative and Restorative Service Providers"

## 2016-11-23 DIAGNOSIS — M25511 Pain in right shoulder: Secondary | ICD-10-CM | POA: Diagnosis not present

## 2016-11-23 DIAGNOSIS — R29898 Other symptoms and signs involving the musculoskeletal system: Secondary | ICD-10-CM | POA: Diagnosis not present

## 2016-11-23 NOTE — Therapy (Signed)
[place], [name], [address] Phone: [phone]   Fax:  [phone]  Physical Therapy Evaluation  Patient Details  Name: Margaret [name] MRN: [id number] Date of Birth: [birth year] Referring Provider: Dr Delight Ovens  Encounter Date: 11/23/2016      PT End of Session - 11/23/16 1019    Visit Number 1   Number of Visits 24   Date for PT Re-Evaluation 02/15/17   PT Start Time 1020   PT Stop Time 1113   PT Time Calculation (min) 53 min   Activity Tolerance Patient tolerated treatment well      Past Medical History:  Diagnosis Date  . Asthma   . Hypertension     Past Surgical History:  Procedure Laterality Date  . ABDOMINAL HYSTERECTOMY    . JOINT REPLACEMENT     LT KNEE  . TOTAL KNEE ARTHROPLASTY  2006    There were no vitals filed for this visit.       Subjective Assessment - 11/23/16 1032    Subjective Patient reports that she began having Rt shoulder pain beginning 09/17 with progressive increase in symptoms including pain and weakness in the Rt shoudler. She underwent RCR 11/07/16. She remains in airplane splint and sling 24/7. Can remove for exercise and showering.    Pertinent History Lt shoulder RCR 2015; LBP and fusion 2014   How long can you sit comfortably? no limit   How long can you stand comfortably? no limit   How long can you walk comfortably? no limit   Diagnostic tests xrays; MRI   Patient Stated Goals get rid of sling and to use Rt UE again    Currently in Pain? No/denies   Pain Location Shoulder   Pain Orientation Right   Pain Descriptors / Indicators Aching   Aggravating Factors  in am when seh awakens   Pain Relieving Factors rest             [facility] PT Assessment - 11/23/16 0001      Assessment   Medical Diagnosis Rt shoudler pain    Referring Provider Dr Delight Ovens   Onset Date/Surgical Date 11/07/16   Hand Dominance Right   Next MD Visit  12/19/16   Prior Therapy for Lt shoudler and LB     Precautions   Precautions --  in sling/airplane splint      Balance Screen   Has the patient fallen in the past 6 months No   Has the patient had a decrease in activity level because of a fear of falling?  No   Is the patient reluctant to leave their home because of a fear of falling?  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    Leisure household chores; Firefighter; has a loom at home     Observation/Other Assessments   Observations in abduction splint/sling Rt UE    Focus on Therapeutic Outcomes (FOTO)  74% limitation      Sensation   Additional Comments WFL's per pt report      Posture/Postural Control   Posture Comments head forward; shoudlers rounded and elevated; increased thoracici kyphosis; scapulae abducted and rotated along the thoracic wall      AROM   Left Shoulder Extension 64 Degrees   Left Shoulder Flexion 158 Degrees  Left Shoulder ABduction 165 Degrees   Left Shoulder Internal Rotation 40 Degrees   Left Shoulder External Rotation 88 Degrees   Cervical Flexion 62   Cervical Extension 53   Cervical - Right Side Bend 33   Cervical - Left Side Bend 24   Cervical - Right Rotation 72   Cervical - Left Rotation 72     Strength   Right/Left Shoulder --  Lt shd WNL's Rt not tested     Palpation   Palpation comment tightness noted through Rt shoulder girdle - pecs; upper trap; leveator; teres; biceps; deltoid                    [facility] Adult PT Treatment/Exercise - 11/23/16 0001      Therapeutic Activites    Therapeutic Activities --  myofacial ball release work Rt shd girdle      Shoulder Exercises: Standing   External Rotation AAROM;Right;10 reps  with cane for ROM - no pain 10 sec hold    Other Standing Exercises pendulum 30 circles CW/CCW   Other Standing Exercises  scap squeeze 10 sec x 10 with noodle      Shoulder Exercises: Stretch   Table Stretch - Flexion --  10 reps 10 sec hold table slide      Electrical Stimulation   Electrical Stimulation Location Rt shoulder   Electrical Stimulation Action IFC   Electrical Stimulation Parameters to tolerance   Electrical Stimulation Goals Pain;Tone     Vasopneumatic   Number Minutes Vasopneumatic  15 minutes   Vasopnuematic Location  Shoulder  Rt   Vasopneumatic Pressure Low   Vasopneumatic Temperature  3*                PT Education - 11/23/16 1058    Education provided Yes   Education Details HEP myofacial ball release work    Forensic psychologist) Educated Patient   Methods Explanation;Demonstration;Tactile cues;Verbal cues;Handout   Comprehension Verbalized understanding;Returned demonstration;Verbal cues required;Tactile cues required          PT Short Term Goals - 11/23/16 1217      PT SHORT TERM GOAL #1   Title Patient independent in initial HEP 01/04/17   Time 6   Period Weeks   Status New     PT SHORT TERM GOAL #2   Title Begin strengthening exercise per protocol 01/04/17   Time 6   Period Weeks   Status New     PT SHORT TERM GOAL #3   Title Patient to use Rt UE for dressing and bathing as well as eating and light kitchen activities that do not require lifting 12/18/16   Time 6   Period Weeks   Status New           PT Long Term Goals - 11/23/16 1021      PT LONG TERM GOAL #1   Title Improve posture and alignment with patient to demonstrate improved posterior shoulder girdle facilitation for improved posture and alignment 02/15/17   Time 12   Period Weeks   Status New     PT LONG TERM GOAL #2   Title AROM Rt shoulder equal or greater than AROM Lt shoudler 02/15/17   Time 12   Period Weeks   Status New     PT LONG TERM GOAL #3   Title 4+/5 to 5/5 strength Rt UE 02/15/17   Time 12   Period Weeks   Status New     PT LONG TERM  GOAL #4   Title Independent in  advanced HEP for discharge 02/15/17   Time 12   Period Weeks   Status New     PT LONG TERM GOAL #5   Title Improve FOTO to </= 42 % limitation 02/15/17   Time 12   Period Weeks   Status New               Plan - 11/23/16 1210    Clinical Impression Statement Patient presents s/p Rt RCR 11/07/16. She remains in abduction splint/sling except for exercise and showering until she returns to MD 12/19/16. Patient has limited Rt UE ROM and strenght; muscular tightness to palpation through the Rt shoudler girdle; poor scapular position/ posture and alsgnment; pain and limited functional activity level.    Rehab Potential Good   PT Frequency 2x / week   PT Duration 6 weeks   PT Treatment/Interventions Neuromuscular re-education;ADLs/Self Care Home Management;Electrical Stimulation;Cryotherapy;Iontophoresis 4mg /ml Dexamethasone;Moist Heat;Ultrasound;Dry needling;Manual techniques;Therapeutic activities;Therapeutic exercise   PT Next Visit Plan progress with P-AAROM; postural correction; manual work including PROM/stretching; modalities as indicated    Consulted and Agree with Plan of Care Patient      Patient will benefit from skilled therapeutic intervention in order to improve the following deficits and impairments:  Postural dysfunction, Improper body mechanics, Pain, Decreased range of motion, Decreased mobility, Decreased strength, Impaired UE functional use, Decreased activity tolerance  Visit Diagnosis: Acute pain of right shoulder - Plan: PT plan of care cert/re-cert  Other symptoms and signs involving the musculoskeletal system - Plan: PT plan of care cert/re-cert     Problem List Patient Active Problem List   Diagnosis Date Noted  . Other chronic pain 11/03/2016  . Thyroid nodule 03/30/2016  . Solitary pulmonary nodule 03/30/2016  . Strain of gluteus medius 10/07/2015  . Anxiety and depression 09/29/2015  . Hip pain, acute 08/31/2015  . Vitamin D insufficiency 04/08/2015   . Elevated vitamin B12 level 04/08/2015  . Hirsutism 01/28/2015  . Right knee DJD 12/17/2014  . Osteoarthritis of right knee 11/04/2014  . Pain in right hip 08/21/2014  . Hair loss 08/21/2014  . Hypothyroidism 07/24/2014  . Vasomotor flushing 03/05/2014  . Intrinsic asthma 12/17/2013  . Spinal stenosis of lumbar region 10/14/2013  . Coffee ground emesis 03/06/2013  . Disorders of bursae and tendons in shoulder region, unspecified (LEFT) 03/01/2013  . Anal fissure 02/22/2013  . Diverticulosis of colon without hemorrhage 02/22/2013  . Hyperglycemia 02/01/2013  . Essential hypertension, benign 01/31/2013  . CAP (community acquired pneumonia) 01/31/2013  . Osteoporosis 01/31/2013  . HSV infection 01/31/2013    Antero Derosia Nilda Simmer PT, MPH  11/23/2016, 12:25 PM  [facility] [place], [name], [address] Phone: [phone]   Fax:  [phone]  Name: Margaret [name] MRN: [id number] Date of Birth: 1946/06/03

## 2016-11-23 NOTE — Patient Instructions (Addendum)
  Scapular Retraction (Standing)   With arms at sides, pinch shoulder blades down and back. Hold 10 sec Repeat __10__ times per set. Do __several__ sessions per day.  Pendulum Circular    Bend forward 90 at waist, leaning on table for support. Rock body in a circular pattern to move arm clockwise __30__ times then counterclockwise __30__ times. Do __3-4 __ sessions per day.      ROM: Flexion    Keeping left arm on table, slide body away until stretch is felt. Hold __10__ seconds. Repeat __10__ times per set. 3-4 times/day   ROM: External / Internal Rotation - Wand   Standing  Holding wand with left hand palm up, push out from body with other hand, palm down. Keep both elbows bent. When stretch is felt, hold __10__ seconds. Repeat to other side, leading with same hand. Keep elbows bent. Repeat ____ times per set. Do ____ sets per session. Do ____ sessions per day.  Self massage using ~ 4 inch ball   TENS

## 2016-11-25 ENCOUNTER — Ambulatory Visit (INDEPENDENT_AMBULATORY_CARE_PROVIDER_SITE_OTHER): Payer: Medicare HMO | Admitting: Rehabilitative and Restorative Service Providers"

## 2016-11-25 ENCOUNTER — Encounter: Payer: Self-pay | Admitting: Rehabilitative and Restorative Service Providers"

## 2016-11-25 DIAGNOSIS — R531 Weakness: Secondary | ICD-10-CM

## 2016-11-25 DIAGNOSIS — M25511 Pain in right shoulder: Secondary | ICD-10-CM

## 2016-11-25 DIAGNOSIS — R29898 Other symptoms and signs involving the musculoskeletal system: Secondary | ICD-10-CM | POA: Diagnosis not present

## 2016-11-25 NOTE — Patient Instructions (Addendum)
Flexors Stretch, Standing    Stand about a thigh's length from table. Grip edges of table. Bend knees until stretch is felt under shoulder blades in chest. Hold _10 seconds. Repeat __3-5_ times per session. Do _2-3__ sessions per day.

## 2016-11-25 NOTE — Therapy (Signed)
[place], [name], [address] Phone: [phone]   Fax:  [phone]  Physical Therapy Treatment  Patient Details  Name: Margaret [name] MRN: [id number] Date of Birth: Jan 13, 1946 Referring Provider: Dr Delight Ovens  Encounter Date: 11/25/2016      PT End of Session - 11/25/16 1537    Visit Number 2   Number of Visits 24   Date for PT Re-Evaluation 02/15/17   PT Start Time [id number]   PT Stop Time 1620   PT Time Calculation (min) 47 min   Activity Tolerance Patient tolerated treatment well      Past Medical History:  Diagnosis Date  . Asthma   . Hypertension     Past Surgical History:  Procedure Laterality Date  . ABDOMINAL HYSTERECTOMY    . JOINT REPLACEMENT     LT KNEE  . TOTAL KNEE ARTHROPLASTY  2006    There were no vitals filed for this visit.      Subjective Assessment - 11/25/16 1538    Subjective Patient reports thta she did ok with her exercises. She reports that the shoulder pain has been managable with some aching on the top at times.    Currently in Pain? No/denies                         [facility] Adult PT Treatment/Exercise - 11/25/16 0001      Shoulder Exercises: Standing   External Rotation AAROM;Right;5 reps  with cane for ROM - no pain 10 sec hold    Flexion AAROM  step back with hands resting on counter 20 sec x 5    Other Standing Exercises pendulum 30 circles CW/CCW   Other Standing Exercises scap squeeze 10 sec x 10 with noodle      Shoulder Exercises: Stretch   Table Stretch - Flexion --  10 reps 10 sec hold table slide      Electrical Stimulation   Electrical Stimulation Location Rt shoulder   Electrical Stimulation Action IFC   Electrical Stimulation Parameters to tolerance   Electrical Stimulation Goals Pain;Tone     Vasopneumatic   Number Minutes Vasopneumatic  15 minutes   Vasopnuematic Location  Shoulder  Rt   Vasopneumatic  Pressure Low   Vasopneumatic Temperature  3*     Manual Therapy   Manual therapy comments pt supine    Joint Mobilization GH circumduction    Soft tissue mobilization pecs; upper trap; leveator; teres   Scapular Mobilization Rt    Passive ROM Lt shoudler flex; abduction in scapular plane; ER/IR in scapular plane                 PT Education - 11/25/16 1614    Education provided Yes   Education Details HEP   Person(s) Educated Patient   Methods Explanation;Demonstration;Tactile cues;Verbal cues;Handout   Comprehension Verbalized understanding;Returned demonstration;Verbal cues required;Tactile cues required          PT Short Term Goals - 11/23/16 1217      PT SHORT TERM GOAL #1   Title Patient independent in initial HEP 01/04/17   Time 6   Period Weeks   Status New     PT SHORT TERM GOAL #2   Title Begin strengthening exercise per protocol 01/04/17   Time 6   Period Weeks   Status New     PT SHORT TERM GOAL #3   Title Patient to use Rt  UE for dressing and bathing as well as eating and light kitchen activities that do not require lifting 12/18/16   Time 6   Period Weeks   Status New           PT Long Term Goals - 11/23/16 1021      PT LONG TERM GOAL #1   Title Improve posture and alignment with patient to demonstrate improved posterior shoulder girdle facilitation for improved posture and alignment 02/15/17   Time 12   Period Weeks   Status New     PT LONG TERM GOAL #2   Title AROM Rt shoulder equal or greater than AROM Lt shoudler 02/15/17   Time 12   Period Weeks   Status New     PT LONG TERM GOAL #3   Title 4+/5 to 5/5 strength Rt UE 02/15/17   Time 12   Period Weeks   Status New     PT LONG TERM GOAL #4   Title Independent in advanced HEP for discharge 02/15/17   Time 12   Period Weeks   Status New     PT LONG TERM GOAL #5   Title Improve FOTO to </= 42 % limitation 02/15/17   Time 12   Period Weeks   Status New                Plan - 11/25/16 1611    Clinical Impression Statement Minimal soreness and no increase in pain following initiatl PT treatment. Good PROM noted with treatment. Toleranted exercises well.    Rehab Potential Good   PT Frequency 2x / week   PT Duration 12 weeks   PT Treatment/Interventions Neuromuscular re-education;ADLs/Self Care Home Management;Electrical Stimulation;Cryotherapy;Iontophoresis 4mg /ml Dexamethasone;Moist Heat;Ultrasound;Dry needling;Manual techniques;Therapeutic activities;Therapeutic exercise   PT Next Visit Plan progress with P-AAROM; postural correction; manual work including PROM/stretching; modalities as indicated    Consulted and Agree with Plan of Care Patient      Patient will benefit from skilled therapeutic intervention in order to improve the following deficits and impairments:  Postural dysfunction, Improper body mechanics, Pain, Decreased range of motion, Decreased mobility, Decreased strength, Impaired UE functional use, Decreased activity tolerance  Visit Diagnosis: Acute pain of right shoulder  Other symptoms and signs involving the musculoskeletal system  Weakness generalized     Problem List Patient Active Problem List   Diagnosis Date Noted  . Other chronic pain 11/03/2016  . Thyroid nodule 03/30/2016  . Solitary pulmonary nodule 03/30/2016  . Strain of gluteus medius 10/07/2015  . Anxiety and depression 09/29/2015  . Hip pain, acute 08/31/2015  . Vitamin D insufficiency 04/08/2015  . Elevated vitamin B12 level 04/08/2015  . Hirsutism 01/28/2015  . Right knee DJD 12/17/2014  . Osteoarthritis of right knee 11/04/2014  . Pain in right hip 08/21/2014  . Hair loss 08/21/2014  . Hypothyroidism 07/24/2014  . Vasomotor flushing 03/05/2014  . Intrinsic asthma 12/17/2013  . Spinal stenosis of lumbar region 10/14/2013  . Coffee ground emesis 03/06/2013  . Disorders of bursae and tendons in shoulder region, unspecified (LEFT) 03/01/2013  . Anal  fissure 02/22/2013  . Diverticulosis of colon without hemorrhage 02/22/2013  . Hyperglycemia 02/01/2013  . Essential hypertension, benign 01/31/2013  . CAP (community acquired pneumonia) 01/31/2013  . Osteoporosis 01/31/2013  . HSV infection 01/31/2013    Dorr Perrot P HoltPT, MPH  11/25/2016, 4:18 PM  [facility] [place], [name], [address] Phone: [phone]   Fax:  [phone]  Name: Margaret [name] MRN: [id number] Date of Birth: Jul 23, 1946

## 2016-12-01 ENCOUNTER — Encounter: Payer: Medicare HMO | Admitting: Physical Therapy

## 2016-12-07 ENCOUNTER — Ambulatory Visit (INDEPENDENT_AMBULATORY_CARE_PROVIDER_SITE_OTHER): Payer: Medicare HMO | Admitting: Rehabilitative and Restorative Service Providers"

## 2016-12-07 ENCOUNTER — Encounter: Payer: Self-pay | Admitting: Rehabilitative and Restorative Service Providers"

## 2016-12-07 DIAGNOSIS — R29898 Other symptoms and signs involving the musculoskeletal system: Secondary | ICD-10-CM | POA: Diagnosis not present

## 2016-12-07 DIAGNOSIS — R531 Weakness: Secondary | ICD-10-CM | POA: Diagnosis not present

## 2016-12-07 DIAGNOSIS — M25511 Pain in right shoulder: Secondary | ICD-10-CM

## 2016-12-07 NOTE — Therapy (Signed)
[place], [name], [address] Phone: [phone]   Fax:  [phone]  Physical Therapy Treatment  Patient Details  Name: Margaret [name] MRN: [id number] Date of Birth: 1946-03-18 Referring Provider: Dr Delight Ovens   Encounter Date: 12/07/2016      PT End of Session - 12/07/16 1439    Visit Number 3   Number of Visits 24   Date for PT Re-Evaluation 02/15/17   PT Start Time [id number]   PT Stop Time 1524   PT Time Calculation (min) 51 min   Activity Tolerance Patient tolerated treatment well      Past Medical History:  Diagnosis Date  . Asthma   . Hypertension     Past Surgical History:  Procedure Laterality Date  . ABDOMINAL HYSTERECTOMY    . JOINT REPLACEMENT     LT KNEE  . TOTAL KNEE ARTHROPLASTY  2006    There were no vitals filed for this visit.      Subjective Assessment - 12/07/16 1440    Subjective Doing fine with exercises at home. Feeling OK - some pain on an intermitent basis.    Currently in Pain? No/denies            [facility] PT Assessment - 12/07/16 0001      Assessment   Medical Diagnosis Rt shoudler pain    Referring Provider Dr Delight Ovens    Onset Date/Surgical Date 11/07/16   Hand Dominance Right   Next MD Visit 12/19/16   Prior Therapy for Lt shoudler and LB     PROM   Right Shoulder Extension 60 Degrees   Right Shoulder Flexion 139 Degrees   Right Shoulder ABduction 137 Degrees   Right Shoulder Internal Rotation 68 Degrees   Right Shoulder External Rotation 29 Degrees                     [facility] Adult PT Treatment/Exercise - 12/07/16 0001      Shoulder Exercises: Standing   External Rotation AAROM;Right;5 reps  with cane for ROM - no pain 10 sec hold    Flexion AAROM  step back with hands resting on counter 20 sec x 5    Extension AAROM;10 reps  with cane    Other Standing Exercises pendulum 30 circles CW/CCW   Other Standing  Exercises scap squeeze 10 sec x 10 with noodle      Shoulder Exercises: Stretch   Table Stretch - Flexion --  10 reps 10 sec hold table slide      Electrical Stimulation   Electrical Stimulation Location Rt shoulder   Electrical Stimulation Action IFC   Electrical Stimulation Parameters to tolerance   Electrical Stimulation Goals Pain;Tone     Vasopneumatic   Number Minutes Vasopneumatic  15 minutes   Vasopnuematic Location  Shoulder  Rt   Vasopneumatic Pressure Low   Vasopneumatic Temperature  3*     Manual Therapy   Manual therapy comments pt supine    Joint Mobilization GH circumduction    Soft tissue mobilization pecs; upper trap; leveator; teres   Scapular Mobilization Rt    Passive ROM Lt shoudler flex; abduction/ER/IR in scapular plane; extension                    PT Short Term Goals - 12/07/16 1510      PT SHORT TERM GOAL #1   Title Patient independent in initial HEP 01/04/17  Time 6   Period Weeks   Status On-going     PT SHORT TERM GOAL #2   Title Begin strengthening exercise per protocol 01/04/17   Time 6   Period Weeks   Status On-going     PT SHORT TERM GOAL #3   Title Patient to use Rt UE for dressing and bathing as well as eating and light kitchen activities that do not require lifting 12/18/16   Time 6   Period Weeks   Status On-going           PT Long Term Goals - 11/23/16 1021      PT LONG TERM GOAL #1   Title Improve posture and alignment with patient to demonstrate improved posterior shoulder girdle facilitation for improved posture and alignment 02/15/17   Time 12   Period Weeks   Status New     PT LONG TERM GOAL #2   Title AROM Rt shoulder equal or greater than AROM Lt shoudler 02/15/17   Time 12   Period Weeks   Status New     PT LONG TERM GOAL #3   Title 4+/5 to 5/5 strength Rt UE 02/15/17   Time 12   Period Weeks   Status New     PT LONG TERM GOAL #4   Title Independent in advanced HEP for discharge 02/15/17    Time 12   Period Weeks   Status New     PT LONG TERM GOAL #5   Title Improve FOTO to </= 42 % limitation 02/15/17   Time 12   Period Weeks   Status New               Plan - 12/07/16 1508    Clinical Impression Statement Minimal pain - just when she moves the wrong way. Sees Dr Creig Hines 12-19-16. Doing well with HEP in initial 6 wk phase of [facility] rehab. Good gains in PROM.    Rehab Potential Good   PT Frequency 2x / week   PT Duration 12 weeks   PT Treatment/Interventions Neuromuscular re-education;ADLs/Self Care Home Management;Electrical Stimulation;Cryotherapy;Iontophoresis 4mg /ml Dexamethasone;Moist Heat;Ultrasound;Dry needling;Manual techniques;Therapeutic activities;Therapeutic exercise   PT Next Visit Plan progress with P-AAROM; postural correction; manual work including PROM/stretching; modalities as indicated - will decrease frequency to 1x/wk until protocol progressing wth rehab   Consulted and Agree with Plan of Care Patient      Patient will benefit from skilled therapeutic intervention in order to improve the following deficits and impairments:  Postural dysfunction, Improper body mechanics, Pain, Decreased range of motion, Decreased mobility, Decreased strength, Impaired UE functional use, Decreased activity tolerance  Visit Diagnosis: Acute pain of right shoulder  Other symptoms and signs involving the musculoskeletal system  Weakness generalized     Problem List Patient Active Problem List   Diagnosis Date Noted  . Other chronic pain 11/03/2016  . Thyroid nodule 03/30/2016  . Solitary pulmonary nodule 03/30/2016  . Strain of gluteus medius 10/07/2015  . Anxiety and depression 09/29/2015  . Hip pain, acute 08/31/2015  . Vitamin D insufficiency 04/08/2015  . Elevated vitamin B12 level 04/08/2015  . Hirsutism 01/28/2015  . Right knee DJD 12/17/2014  . Osteoarthritis of right knee 11/04/2014  . Pain in right hip 08/21/2014  . Hair loss 08/21/2014  .  Hypothyroidism 07/24/2014  . Vasomotor flushing 03/05/2014  . Intrinsic asthma 12/17/2013  . Spinal stenosis of lumbar region 10/14/2013  . Coffee ground emesis 03/06/2013  . Disorders of bursae and tendons in  shoulder region, unspecified (LEFT) 03/01/2013  . Anal fissure 02/22/2013  . Diverticulosis of colon without hemorrhage 02/22/2013  . Hyperglycemia 02/01/2013  . Essential hypertension, benign 01/31/2013  . CAP (community acquired pneumonia) 01/31/2013  . Osteoporosis 01/31/2013  . HSV infection 01/31/2013    Mykael Batz Nilda Simmer PT, MPH  12/07/2016, 3:12 PM  [facility] [place], [name], [address] Phone: [phone]   Fax:  [phone]  Name: Margaret [name] MRN: [id number] Date of Birth: 1946-05-09

## 2016-12-09 ENCOUNTER — Encounter: Payer: Medicare HMO | Admitting: Rehabilitative and Restorative Service Providers"

## 2016-12-14 ENCOUNTER — Encounter: Payer: Self-pay | Admitting: Rehabilitative and Restorative Service Providers"

## 2016-12-14 ENCOUNTER — Ambulatory Visit (INDEPENDENT_AMBULATORY_CARE_PROVIDER_SITE_OTHER): Payer: Medicare HMO | Admitting: Rehabilitative and Restorative Service Providers"

## 2016-12-14 DIAGNOSIS — M545 Low back pain, unspecified: Secondary | ICD-10-CM

## 2016-12-14 DIAGNOSIS — R531 Weakness: Secondary | ICD-10-CM | POA: Diagnosis not present

## 2016-12-14 DIAGNOSIS — R29898 Other symptoms and signs involving the musculoskeletal system: Secondary | ICD-10-CM | POA: Diagnosis not present

## 2016-12-14 DIAGNOSIS — M25511 Pain in right shoulder: Secondary | ICD-10-CM | POA: Diagnosis not present

## 2016-12-14 NOTE — Therapy (Signed)
[place], [name], [address] Phone: [phone]   Fax:  [phone]  Physical Therapy Treatment  Patient Details  Name: Margaret [name] MRN: [id number] Date of Birth: 08/31/1946 Referring Provider: Dr. Delight Ovens  Encounter Date: 12/14/2016    Past Medical History:  Diagnosis Date  . Asthma   . Hypertension     Past Surgical History:  Procedure Laterality Date  . ABDOMINAL HYSTERECTOMY    . JOINT REPLACEMENT     LT KNEE  . TOTAL KNEE ARTHROPLASTY  2006    There were no vitals filed for this visit.      Subjective Assessment - 12/14/16 1439    Subjective Low back is really hurting today. Still no pain in the Rt shoulder but Lt shoulder is problematic. Patient reports difficulty lifting a coffee cup up with the Lt UE.    Currently in Pain? No/denies            [facility] PT Assessment - 12/14/16 0001      Assessment   Medical Diagnosis Rt shoudler pain    Referring Provider Dr. Delight Ovens   Onset Date/Surgical Date 11/07/16   Hand Dominance Right   Next MD Visit 12/19/16   Prior Therapy for Lt shoudler and LB     PROM   Overall PROM  --  PROM Rt shoulder w/ pt supine scapular plane; no pain   Right Shoulder Extension 60 Degrees   Right Shoulder Flexion 154 Degrees   Right Shoulder ABduction 143 Degrees   Right Shoulder Internal Rotation 68 Degrees   Right Shoulder External Rotation 50 Degrees  no resistance no pain with passive ER                      [facility] Adult PT Treatment/Exercise - 12/14/16 0001      Shoulder Exercises: Standing   External Rotation AAROM;Right;5 reps  with cane for ROM - no pain 10 sec hold    Theraband Level (Shoulder External Rotation) --   Internal Rotation --   Theraband Level (Shoulder Internal Rotation) --   Flexion AAROM  step back with hands resting on counter 20 sec x 5    Extension AAROM;10 reps  with cane    Other  Standing Exercises pendulum 30 circles CW/CCW   Other Standing Exercises scap squeeze 10 sec x 10 with noodle      Shoulder Exercises: Stretch   Table Stretch - Flexion --  10 reps 10 sec hold table slide      Cryotherapy   Number Minutes Cryotherapy 15 Minutes   Cryotherapy Location Shoulder   Type of Cryotherapy Ice pack     Electrical Stimulation   Electrical Stimulation Location Rt shoulder   Electrical Stimulation Action IFC   Electrical Stimulation Parameters to tolerance   Electrical Stimulation Goals Pain;Tone     Manual Therapy   Manual therapy comments pt supine    Joint Mobilization GH circumduction    Soft tissue mobilization pecs; upper trap; leveator; teres   Scapular Mobilization Rt    Passive ROM Lt shoudler flex; abduction/ER/IR in scapular plane; extension                  PT Education - 12/14/16 1455    Education provided Yes   Education Details HEP   Person(s) Educated Patient   Methods Explanation   Comprehension Verbalized understanding  PT Short Term Goals - 12/07/16 1510      PT SHORT TERM GOAL #1   Title Patient independent in initial HEP 01/04/17   Time 6   Period Weeks   Status On-going     PT SHORT TERM GOAL #2   Title Begin strengthening exercise per protocol 01/04/17   Time 6   Period Weeks   Status On-going     PT SHORT TERM GOAL #3   Title Patient to use Rt UE for dressing and bathing as well as eating and light kitchen activities that do not require lifting 12/18/16   Time 6   Period Weeks   Status On-going           PT Long Term Goals - 12/14/16 1519      PT LONG TERM GOAL #1   Title Improve posture and alignment with patient to demonstrate improved posterior shoulder girdle facilitation for improved posture and alignment 02/15/17   Time 12   Period Weeks   Status On-going     PT LONG TERM GOAL #2   Title AROM Rt shoulder equal or greater than AROM Lt shoudler 02/15/17   Time 12   Period Weeks    Status On-going     PT LONG TERM GOAL #3   Title 4+/5 to 5/5 strength Rt UE 02/15/17   Time 12   Period Weeks   Status On-going     PT LONG TERM GOAL #4   Title Independent in advanced HEP for discharge 02/15/17   Time 12   Period Weeks   Status On-going     PT LONG TERM GOAL #5   Title Improve FOTO to </= 42 % limitation 02/15/17   Time 12   Period Weeks   Status On-going               Plan - 12/14/16 1517    Clinical Impression Statement Minimal pain or discomfort Rt shoudler. More pain in the Lf shoulder than the Rt. Working on exercises at home. Good PROM for 5 weeks post op. Will progress with rehab as advised per protocol. RTD 12/19/16.    Rehab Potential Good   PT Frequency 2x / week   PT Duration 12 weeks   PT Treatment/Interventions Neuromuscular re-education;ADLs/Self Care Home Management;Electrical Stimulation;Cryotherapy;Iontophoresis 4mg /ml Dexamethasone;Moist Heat;Ultrasound;Dry needling;Manual techniques;Therapeutic activities;Therapeutic exercise   PT Next Visit Plan progress with P-AAROM; postural correction; manual work including PROM/stretching; modalities as indicated - will decrease frequency to 1x/wk until protocol progressing wth rehab. Note to MD    Consulted and Agree with Plan of Care Patient      Patient will benefit from skilled therapeutic intervention in order to improve the following deficits and impairments:  Postural dysfunction, Improper body mechanics, Pain, Decreased range of motion, Decreased mobility, Decreased strength, Impaired UE functional use, Decreased activity tolerance  Visit Diagnosis: Acute pain of right shoulder  Other symptoms and signs involving the musculoskeletal system  Weakness generalized  Acute bilateral low back pain without sciatica     Problem List Patient Active Problem List   Diagnosis Date Noted  . Other chronic pain 11/03/2016  . Thyroid nodule 03/30/2016  . Solitary pulmonary nodule 03/30/2016  .  Strain of gluteus medius 10/07/2015  . Anxiety and depression 09/29/2015  . Hip pain, acute 08/31/2015  . Vitamin D insufficiency 04/08/2015  . Elevated vitamin B12 level 04/08/2015  . Hirsutism 01/28/2015  . Right knee DJD 12/17/2014  . Osteoarthritis of right knee 11/04/2014  .  Pain in right hip 08/21/2014  . Hair loss 08/21/2014  . Hypothyroidism 07/24/2014  . Vasomotor flushing 03/05/2014  . Intrinsic asthma 12/17/2013  . Spinal stenosis of lumbar region 10/14/2013  . Coffee ground emesis 03/06/2013  . Disorders of bursae and tendons in shoulder region, unspecified (LEFT) 03/01/2013  . Anal fissure 02/22/2013  . Diverticulosis of colon without hemorrhage 02/22/2013  . Hyperglycemia 02/01/2013  . Essential hypertension, benign 01/31/2013  . CAP (community acquired pneumonia) 01/31/2013  . Osteoporosis 01/31/2013  . HSV infection 01/31/2013    Rory Xiang Nilda Simmer PT, MPH  12/14/2016, 3:22 PM  [facility] [place], [name], [address] Phone: [phone]   Fax:  [phone]  Name: Margaret [name] MRN: [id number] Date of Birth: [birth year]

## 2016-12-21 ENCOUNTER — Encounter: Payer: Medicare HMO | Admitting: Physical Therapy

## 2016-12-25 ENCOUNTER — Encounter: Payer: Self-pay | Admitting: Osteopathic Medicine

## 2016-12-25 DIAGNOSIS — M25511 Pain in right shoulder: Secondary | ICD-10-CM | POA: Insufficient documentation

## 2016-12-26 ENCOUNTER — Ambulatory Visit (INDEPENDENT_AMBULATORY_CARE_PROVIDER_SITE_OTHER): Payer: Medicare HMO | Admitting: Physician Assistant

## 2016-12-26 ENCOUNTER — Ambulatory Visit (INDEPENDENT_AMBULATORY_CARE_PROVIDER_SITE_OTHER): Payer: Medicare HMO

## 2016-12-26 ENCOUNTER — Encounter: Payer: Medicare HMO | Admitting: Physical Therapy

## 2016-12-26 ENCOUNTER — Encounter: Payer: Self-pay | Admitting: Physician Assistant

## 2016-12-26 VITALS — BP 130/76 | HR 73 | Temp 98.3°F | Resp 14 | Wt 126.0 lb

## 2016-12-26 DIAGNOSIS — R058 Other specified cough: Secondary | ICD-10-CM

## 2016-12-26 DIAGNOSIS — J45909 Unspecified asthma, uncomplicated: Secondary | ICD-10-CM

## 2016-12-26 DIAGNOSIS — R918 Other nonspecific abnormal finding of lung field: Secondary | ICD-10-CM | POA: Diagnosis not present

## 2016-12-26 DIAGNOSIS — R05 Cough: Secondary | ICD-10-CM

## 2016-12-26 DIAGNOSIS — J449 Chronic obstructive pulmonary disease, unspecified: Secondary | ICD-10-CM | POA: Diagnosis not present

## 2016-12-26 DIAGNOSIS — R0602 Shortness of breath: Secondary | ICD-10-CM | POA: Diagnosis not present

## 2016-12-26 MED ORDER — ALBUTEROL SULFATE HFA 108 (90 BASE) MCG/ACT IN AERS: 1.0000 | INHALATION_SPRAY | RESPIRATORY_TRACT | 0 refills | Status: DC | PRN

## 2016-12-26 MED ORDER — AZITHROMYCIN 250 MG PO TABS: ORAL_TABLET | ORAL | 0 refills | Status: DC

## 2016-12-26 MED ORDER — PREDNISONE 50 MG PO TABS: ORAL_TABLET | ORAL | 0 refills | Status: DC

## 2016-12-26 NOTE — Progress Notes (Addendum)
HPI:                                                                Margaret [name] is a 71 y.o. female who presents to [facility] today for cough  Patient with history of asthma of undetermined severity presents with cough x 1 month. Cough is productive of purulent sputum. It is worsened by light exercise, such as walking. Patient reports wheezing, especially with exertion. Patient denies fever, chills, weight loss, and hemoptysis. She endorses chronic rhinitis. Denies recent travel. She has not smoked since 2009. She is currently in between refills of her Spiriva. She is out of her Proair and states insurance will no longer cover it.   She is followed by Dr. Tilden Dome for a pulmonary nodule. Last CT chest on 08/09/16 showed an interval decrease in RLL groundglass opacity, a new 64mm RUL nodule, and redemonstrated chronic atypical mycobacterial infection with improved consolidation in RML.   Health Maintenance Health Maintenance  Topic Date Due  . ZOSTAVAX  01/09/2007  . MAMMOGRAM  06/17/2016  . TETANUS/TDAP  10/30/2021  . COLONOSCOPY  12/31/2022  . INFLUENZA VACCINE  Completed  . DEXA SCAN  Completed  . Hepatitis C Screening  Completed  . PNA vac Low Risk Adult  Completed    Past Medical History:  Diagnosis Date  . Asthma   . Hypertension    Past Surgical History:  Procedure Laterality Date  . ABDOMINAL HYSTERECTOMY    . JOINT REPLACEMENT     LT KNEE  . TOTAL KNEE ARTHROPLASTY  2006   Social History  Substance Use Topics  . Smoking status: Former Smoker    Quit date: 02/01/2008  . Smokeless tobacco: Never Used  . Alcohol use Yes     Comment: SOCIALLY   family history includes Emphysema in her mother; Stroke in her other.  Review of Systems  Constitutional: Negative for chills, fever, malaise/fatigue and weight loss.  HENT: Positive for congestion. Negative for ear pain, nosebleeds, sinus pain and sore throat.   Eyes:  Positive for redness.  Respiratory: Positive for cough, sputum production, shortness of breath and wheezing. Negative for hemoptysis.   Cardiovascular: Negative for chest pain, palpitations and orthopnea.  Gastrointestinal: Negative for vomiting.  Musculoskeletal: Negative for myalgias.  Skin: Negative for rash.  Neurological: Negative for dizziness and headaches.     Medications: Current Outpatient Prescriptions  Medication Sig Dispense Refill  . amLODipine (NORVASC) 10 MG tablet Take 1 tablet (10 mg total) by mouth daily. 90 tablet 3  . FLUoxetine (PROZAC) 40 MG capsule Take 2 capsules (80 mg total) by mouth daily. 180 capsule 3  . levothyroxine (SYNTHROID, LEVOTHROID) 50 MCG tablet Take 1 tablet (50 mcg total) by mouth daily before breakfast. 90 tablet 1  . linaclotide (LINZESS) 145 MCG CAPS capsule Take 1 capsule (145 mcg total) by mouth daily. To help with constipation. 90 capsule 3  . losartan (COZAAR) 100 MG tablet Take 1 tablet (100 mg total) by mouth daily. 90 tablet 3  . meloxicam (MOBIC) 15 MG tablet Take 15 mg by mouth daily.    [name] morphine (MSIR) 15 MG tablet Take 15 mg by mouth every 4 (four) hours as needed for severe pain.    [name]  SPIRIVA RESPIMAT 2.5 MCG/ACT AERS USE AS DIRECTED 4 g 0   No current facility-administered medications for this visit.    Allergies  Allergen Reactions  . Evista [Raloxifene]     Back pain  . Floxin [Ofloxacin] Hives  . Neosporin [Neomycin-Polymyxin-Gramicidin] Hives  . Other     ANTIBIOTIC OINT - UNSURE OF NAME   . Tetracyclines & Related        Objective:  BP 130/76   Pulse 73   Temp 98.3 F (36.8 C) (Oral)   Resp 14   Wt 126 lb (57.2 kg)   SpO2 92%   BMI 23.42 kg/m   Gen: well-groomed, cooperative, not ill-appearing, no distress HEENT: normal conjunctiva, right sclera injected, left sclera white Lungs: Normal work of breathing, no accessory muscle use, clear to auscultation bilaterally, occasional cough with deep expiration,  no wheezes, rales or rhonchi Heart: Normal rate, regular rhythm, s1 and s2 distinct, no murmurs, clicks or rubs Neuro: alert and oriented x 3, EOM's intact Skin: warm and dry, no rashes or lesions on exposed skin, no cyanosis  I personally reviewed the CT Chest report from 08/09/16 CT Chest Wo Contrast (08/09/2016 9:34 AM) CT Chest Wo Contrast (08/09/2016 9:34 AM)  Specimen Performing Laboratory   [id number]    CT Chest Wo Contrast (08/09/2016 9:34 AM)  Impressions  IMPRESSION:    1. Interval decrease in size of right lower lobe groundglass opacity, favored to be infectious or inflammatory. Recommend additional follow-up CT in 6-12 months to ensure continued resolution.    2. New 3 mm right upper lobe nodule. Recommend attention on follow-up exam.    3. Redemonstrated findings suggestive of chronic atypical mycobacterial infection with improved consolidation in the right middle lobe.        CT Chest Wo Contrast (08/09/2016 9:34 AM)  Narrative  COMPARISON: 03/10/2016    INDICATION: R91.1: Solitary pulmonary nodule    TECHNIQUE:Routine Chest CT without contrast - Radiation dose reduction was utilized (automated exposure control, mA or kV adjustment based on patient size, or iterative image reconstruction).  Exam date/time: 08/09/2016 9:04 AM     FINDINGS:     SUPPORT APPARATUS:  - N.A.     LUNGS/PLEURA:  - Patent central airways.  - No pleural effusion or pneumothorax. Upper lobe predominant centrilobular edema. Improved opacity in the right middle lobe.  - Stable 5 mm nodule in the lingula. Decreased conspicuity of a now 5 mm groundglass opacity in the right lower lobe (series 2, image 70). New 3 mm right upper lobe nodule (series 8, image 68). There are a few tree-in-bud opacities within the right   middle lobe, similar to that seen on the prior exam.    HEART/MEDIASTINUM:  - Cardiac size is normal. No pericardial effusion.  - Normal caliber thoracic  aorta and main pulmonary artery.  - No hilar, mediastinal, or axillary lymphadenopathy.    MUSCULOSKELETAL:  - No acute or destructive osseous processes. Mild multilevel degenerative disc disease.    UPPER ABDOMEN:  - Small hiatal hernia.          Assessment and Plan: 71 y.o. female with history of intrinsic asthma and pulmonary nodules with  Productive cough x 1 month - patient given DuoNeb in clinic for subjective wheezes. No respiratory distress. No abnormal breath sounds appreciable on exam. Pulse ox 92% on room air. Patient felt improved after breathing treatment. - given duration of symptoms, will cover for CAP with azithromycin - prednisone burst - albuterol prn for  wheezing - DG Chest 2 View; Future - follow-up with pulmonology in 1 week  Patient education and anticipatory guidance given Patient agrees with treatment plan Follow-up in 1 week w/pulmonology or sooner as needed  Darlyne Russian PA-C

## 2016-12-26 NOTE — Patient Instructions (Signed)
Go downstairs for chest xray. We will contact you with results. Take antibiotic and steroid as prescribed. Use rescue inhaler 1-2 puffs every 4 hours as needed for wheezing Follow-up in 1 week with your pulmonologist   Bronchospasm, Adult A bronchospasm is a spasm or tightening of the airways going into the lungs. During a bronchospasm breathing becomes more difficult because the airways get smaller. When this happens there can be coughing, a whistling sound when breathing (wheezing), and difficulty breathing. Bronchospasm is often associated with asthma, but not all patients who experience a bronchospasm have asthma. What are the causes? A bronchospasm is caused by inflammation or irritation of the airways. The inflammation or irritation may be triggered by:  Allergies (such as to animals, pollen, food, or mold). Allergens that cause bronchospasm may cause wheezing immediately after exposure or many hours later.  Infection. Viral infections are believed to be the most common cause of bronchospasm.  Exercise.  Irritants (such as pollution, cigarette smoke, strong odors, aerosol sprays, and paint fumes).  Weather changes. Winds increase molds and pollens in the air. Rain refreshes the air by washing irritants out. Cold air may cause inflammation.  Stress and emotional upset. What are the signs or symptoms?  Wheezing.  Excessive nighttime coughing.  Frequent or severe coughing with a simple cold.  Chest tightness.  Shortness of breath. How is this diagnosed? Bronchospasm is usually diagnosed through a history and physical exam. Tests, such as chest X-rays, are sometimes done to look for other conditions. How is this treated?  Inhaled medicines can be given to open up your airways and help you breathe. The medicines can be given using either an inhaler or a nebulizer machine.  Corticosteroid medicines may be given for severe bronchospasm, usually when it is associated with  asthma. Follow these instructions at home:  Always have a plan prepared for seeking medical care. Know when to call your health care provider and local emergency services (911 in the [place] Know where you can access local emergency care.  Only take medicines as directed by your health care provider.  If you were prescribed an inhaler or nebulizer machine, ask your health care provider to explain how to use it correctly. Always use a spacer with your inhaler if you were given one.  It is necessary to remain calm during an attack. Try to relax and breathe more slowly.  Control your home environment in the following ways:  Change your heating and air conditioning filter at least once a month.  Limit your use of fireplaces and wood stoves.  Do not smoke and do not allow smoking in your home.  Avoid exposure to perfumes and fragrances.  Get rid of pests (such as roaches and mice) and their droppings.  Throw away plants if you see mold on them.  Keep your house clean and dust free.  Replace carpet with wood, tile, or vinyl flooring. Carpet can trap dander and dust.  Use allergy-proof pillows, mattress covers, and box spring covers.  Wash bed sheets and blankets every week in hot water and dry them in a dryer.  Use blankets that are made of polyester or cotton.  Wash hands frequently. Contact a health care provider if:  You have muscle aches.  You have chest pain.  The sputum changes from clear or white to yellow, green, gray, or bloody.  The sputum you cough up gets thicker.  There are problems that may be related to the medicine you are given, such as  a rash, itching, swelling, or trouble breathing. Get help right away if:  You have worsening wheezing and coughing even after taking your prescribed medicines.  You have increased difficulty breathing.  You develop severe chest pain. This information is not intended to replace advice given to you by your health care  provider. Make sure you discuss any questions you have with your health care provider. Document Released: 11/24/2003 Document Revised: 04/28/2016 Document Reviewed: 05/13/2013 Elsevier Interactive Patient Education  2017 [employer].

## 2016-12-29 ENCOUNTER — Ambulatory Visit (INDEPENDENT_AMBULATORY_CARE_PROVIDER_SITE_OTHER): Payer: Medicare HMO | Admitting: Physical Therapy

## 2016-12-29 DIAGNOSIS — M25511 Pain in right shoulder: Secondary | ICD-10-CM

## 2016-12-29 DIAGNOSIS — R29898 Other symptoms and signs involving the musculoskeletal system: Secondary | ICD-10-CM

## 2016-12-29 DIAGNOSIS — R531 Weakness: Secondary | ICD-10-CM

## 2016-12-29 NOTE — Therapy (Signed)
[facility] [place], [name], [address] Phone: [phone]   Fax:  [phone]  Physical Therapy Treatment  Patient Details  Name: Margaret [name] MRN: [id number] Date of Birth: 01/22/1946 Referring Provider: Dr. Creig Hines.   Encounter Date: 12/29/2016      PT End of Session - 12/29/16 1454    Visit Number 4   Number of Visits 24   Date for PT Re-Evaluation 02/15/17   PT Start Time 1446   PT Stop Time 1539   PT Time Calculation (min) 53 min   Activity Tolerance Patient limited by pain      Past Medical History:  Diagnosis Date  . Asthma   . Hypertension     Past Surgical History:  Procedure Laterality Date  . ABDOMINAL HYSTERECTOMY    . JOINT REPLACEMENT     LT KNEE  . TOTAL KNEE ARTHROPLASTY  2006    There were no vitals filed for this visit.      Subjective Assessment - 12/29/16 1454    Subjective Pt reports two days ago she was reaching to side of bed and felt a sharp pain, heard a bad noise in Rt shoulder. She states she has some visible bruising.  She is on prednisone for pneumonia; this has helped her back and shoulder pain.  Only has pain in Rt shoulder (10/10) if using her Rt outside comfort zone.    Currently in Pain? No/denies   Pain Location Shoulder   Pain Orientation Right            [facility] PT Assessment - 12/29/16 0001      Assessment   Medical Diagnosis Rt shoudler pain    Referring Provider Dr. Creig Hines.    Onset Date/Surgical Date 11/07/16   Hand Dominance Right   Prior Therapy for Lt shoudler and LB     ROM / Strength   AROM / PROM / Strength AROM     AROM   AROM Assessment Site Shoulder   Right/Left Shoulder Right   Right Shoulder Flexion 137 Degrees supine with cane 126 degrees standing @ wall ladder, 50 deg standing AROM with pain   Right Shoulder ABduction   Right shoulder External rotation 35 Degrees  standing, with pain  60 deg, supine with arm abdct  70 deg.            [facility] Adult PT Treatment/Exercise - 12/29/16 0001      Shoulder Exercises: Supine   External Rotation AAROM;Right;10 reps  with cane   Flexion AAROM;Both;10 reps  with cane   Flexion Limitations then bench press with cane (AROM) x 10 reps      Shoulder Exercises: Sidelying   External Rotation AROM;Right;10 reps   External Rotation Limitations limited ROM in this position     Shoulder Exercises: Pulleys   Flexion --  10 reps, 10 sec hold, to tolerance    ABduction --  5 reps, 10 sec hold     Shoulder Exercises: Isometric Strengthening   Flexion 5X5"   Extension 5X5"   External Rotation 5X5"   Internal Rotation 5X5"     Shoulder Exercises: Stretch   Other Shoulder Stretches pendulum      Electrical Stimulation   Electrical Stimulation Location Rt shoulder   Electrical Stimulation Action IFC   Electrical Stimulation Parameters to tolerance    Electrical Stimulation Goals Tone;Pain     Vasopneumatic   Number Minutes Vasopneumatic  15 minutes   Vasopnuematic Location  Shoulder   Vasopneumatic Pressure Low   Vasopneumatic Temperature  3*                PT Education - 12/29/16 1527    Education provided Yes   Education Details HEP - isometrics, AAROM supine with cane    Person(s) Educated Patient   Methods Explanation;Handout;Demonstration   Comprehension Verbalized understanding;Returned demonstration          PT Short Term Goals - 12/07/16 1510      PT SHORT TERM GOAL #1   Title Patient independent in initial HEP 01/04/17   Time 6   Period Weeks   Status On-going     PT SHORT TERM GOAL #2   Title Begin strengthening exercise per protocol 01/04/17   Time 6   Period Weeks   Status On-going     PT SHORT TERM GOAL #3   Title Patient to use Rt UE for dressing and bathing as well as eating and light kitchen activities that do not require lifting 12/18/16   Time 6   Period Weeks   Status On-going           PT Long Term  Goals - 12/14/16 1519      PT LONG TERM GOAL #1   Title Improve posture and alignment with patient to demonstrate improved posterior shoulder girdle facilitation for improved posture and alignment 02/15/17   Time 12   Period Weeks   Status On-going     PT LONG TERM GOAL #2   Title AROM Rt shoulder equal or greater than AROM Lt shoudler 02/15/17   Time 12   Period Weeks   Status On-going     PT LONG TERM GOAL #3   Title 4+/5 to 5/5 strength Rt UE 02/15/17   Time 12   Period Weeks   Status On-going     PT LONG TERM GOAL #4   Title Independent in advanced HEP for discharge 02/15/17   Time 12   Period Weeks   Status On-going     PT LONG TERM GOAL #5   Title Improve FOTO to </= 42 % limitation 02/15/17   Time 12   Period Weeks   Status On-going               Plan - 12/29/16 1528    Clinical Impression Statement Pt is now 7 weeks post-op for Rt rotator cuff repair.  She has had a recent flare of pain and decreased ROM due to injury in arm while reaching. She was encouraged to contact her MD to inform him, in case they would like to see her.  Pt had pain with small range AROM of flex and scaption in standing; limited pain with supine AAROM.  No goals met this visit; only 4 th visit since beginning therapy on 11/23/16.     Rehab Potential Good   PT Frequency 2x / week   PT Duration 12 weeks   PT Treatment/Interventions Neuromuscular re-education;ADLs/Self Care Home Management;Electrical Stimulation;Cryotherapy;Iontophoresis 19m/ml Dexamethasone;Moist Heat;Ultrasound;Dry needling;Manual techniques;Therapeutic activities;Therapeutic exercise   PT Next Visit Plan Await further instruction from MD.  Progress AROM/ AAROM.  Modalities as needed. Manual work including stretching.    Consulted and Agree with Plan of Care Patient      Patient will benefit from skilled therapeutic intervention in order to improve the following deficits and impairments:  Postural dysfunction, Improper  body mechanics, Pain, Decreased range of motion, Decreased mobility, Decreased strength, Impaired UE functional use, Decreased activity  tolerance  Visit Diagnosis: Acute pain of right shoulder  Other symptoms and signs involving the musculoskeletal system  Weakness generalized     Problem List Patient Active Problem List   Diagnosis Date Noted  . Pulmonary nodules 12/26/2016  . Pain in joint, shoulder region 12/25/2016  . Other chronic pain 11/03/2016  . Thyroid nodule 03/30/2016  . Solitary pulmonary nodule 03/30/2016  . Strain of gluteus medius 10/07/2015  . Anxiety and depression 09/29/2015  . Hip pain, acute 08/31/2015  . Vitamin D insufficiency 04/08/2015  . Elevated vitamin B12 level 04/08/2015  . Hirsutism 01/28/2015  . Right knee DJD 12/17/2014  . Osteoarthritis of right knee 11/04/2014  . Pain in right hip 08/21/2014  . Hair loss 08/21/2014  . Hypothyroidism 07/24/2014  . Vasomotor flushing 03/05/2014  . Intrinsic asthma 12/17/2013  . Spinal stenosis of lumbar region 10/14/2013  . Coffee ground emesis 03/06/2013  . Disorders of bursae and tendons in shoulder region, unspecified (LEFT) 03/01/2013  . Anal fissure 02/22/2013  . Diverticulosis of colon without hemorrhage 02/22/2013  . Hyperglycemia 02/01/2013  . Essential hypertension, benign 01/31/2013  . CAP (community acquired pneumonia) 01/31/2013  . Osteoporosis 01/31/2013  . HSV infection 01/31/2013   Kerin Perna, PTA 12/29/16 3:36 PM  [facility] [place], [name], [address] Phone: [phone]   Fax:  [phone]  Name: Margaret [name] MRN: [id number] Date of Birth: [birth year]

## 2016-12-29 NOTE — Patient Instructions (Signed)
Strengthening: Isometric Flexion  Using wall for resistance, press right fist into wall using light pressure. Hold _5-10___ seconds. Repeat _10___ times per set. Do __1-2__ sets per session.   Extension (Isometric)  Place left bent elbow and back of arm against wall. Press elbow against wall. Hold _5-10___ seconds. Repeat __10__ times. Do _1-2___ sessions per day.  Internal Rotation (Isometric)  Place palm of right fist against door frame, with elbow bent. Press fist against door frame. Hold _5-10___ seconds. Repeat __10__ times. Do _1-2___ sessions per day.  External Rotation (Isometric)  Place back of left fist against door frame, with elbow bent. Press fist against door frame. Hold _5-10__ seconds. Repeat _10___ times. Do __1-2__ sessions per day.  Cane Exercise: Flexion    Lie on back, holding cane above chest. Keeping arms as straight as possible, lower cane toward floor beyond head. Hold __5-10__ seconds. Repeat __10__ times. Do __1-2__ sessions per day.   [facility] [place], [place] [address]  [phone] (office) [phone] (fax)

## 2017-01-03 DIAGNOSIS — J441 Chronic obstructive pulmonary disease with (acute) exacerbation: Secondary | ICD-10-CM | POA: Diagnosis not present

## 2017-01-03 DIAGNOSIS — R05 Cough: Secondary | ICD-10-CM | POA: Diagnosis not present

## 2017-01-05 ENCOUNTER — Ambulatory Visit (INDEPENDENT_AMBULATORY_CARE_PROVIDER_SITE_OTHER): Payer: Medicare HMO | Admitting: Physical Therapy

## 2017-01-05 DIAGNOSIS — R29898 Other symptoms and signs involving the musculoskeletal system: Secondary | ICD-10-CM

## 2017-01-05 DIAGNOSIS — M25511 Pain in right shoulder: Secondary | ICD-10-CM | POA: Diagnosis not present

## 2017-01-05 DIAGNOSIS — R531 Weakness: Secondary | ICD-10-CM | POA: Diagnosis not present

## 2017-01-05 NOTE — Patient Instructions (Signed)
  Low Row: Standing   Face anchor, feet shoulder width apart. Palms up, pull arms back, squeezing shoulder blades together. Repeat 10__ times per set. Do 2-3__ sets per session. Do 2-3__ sessions per week. Anchor Height: Waist     Strengthening: Resisted Extension   Hold tubing in both hands, arms forward. Pull arms back, elbow straight. Repeat _10___ times per set. Do 2-3____ sets per session. Do 2-3____ sessions per week  External Rotation (Isometric)    Place back of left fist against door frame, with elbow bent. Press fist against door frame. Hold _10_ seconds. Repeat _10___ times. Do _2___ sessions per day.   [facility] at [facility] [place], [place] [address]  [phone] (office) [phone] (fax)

## 2017-01-05 NOTE — Therapy (Signed)
[place], [name], [address] Phone: [phone]   Fax:  [phone]  Physical Therapy Treatment  Patient Details  Name: Margaret [name] MRN: [id number] Date of Birth: Nov 28, 1946 Referring Provider: Dr. Creig Hines  Encounter Date: 01/05/2017      PT End of Session - 01/05/17 1412    Visit Number 5   Number of Visits 24   Date for PT Re-Evaluation 02/15/17   PT Start Time [id number]   PT Stop Time 1501   PT Time Calculation (min) 56 min   Activity Tolerance Patient tolerated treatment well      Past Medical History:  Diagnosis Date  . Asthma   . Hypertension     Past Surgical History:  Procedure Laterality Date  . ABDOMINAL HYSTERECTOMY    . JOINT REPLACEMENT     LT KNEE  . TOTAL KNEE ARTHROPLASTY  2006    There were no vitals filed for this visit.      Subjective Assessment - 01/05/17 1413    Subjective Pt reports she saw surgeon, and he said, "I hope you didn't mess anything up".   She said the day she visited him, her shoulder pain had subsided.  She is still on prednisone for her pneumonia.     Currently in Pain? No/denies  up to 8/10 when raising arm up.    Pain Location Shoulder   Pain Orientation Right            [facility] PT Assessment - 01/05/17 0001      Assessment   Medical Diagnosis Rt shoudler pain    Referring Provider Dr. Creig Hines   Onset Date/Surgical Date 11/07/16   Hand Dominance Right     AROM   Right/Left Shoulder Right   Right Shoulder Flexion 157 Degrees  standing   Right Shoulder ABduction 148 Degrees  standing    Right Shoulder External Rotation 85 Degrees  supine, shoulder abdct 90 deg          [facility] Adult PT Treatment/Exercise - 01/05/17 0001      Elbow Exercises   Elbow Flexion Right;10 reps;Seated;Bar weights/barbell  2#     Shoulder Exercises: Supine   External Rotation 5 reps;Right  AAROM with cane.  then hands behind head pressing down  elbow     Shoulder Exercises: Seated   Extension Both;10 reps;Theraband   Theraband Level (Shoulder Extension) Level 1 (Yellow)   Row Strengthening;Both;12 reps   Theraband Level (Shoulder Row) Level 1 (Yellow)   External Rotation --  unable to tolerate light band     Shoulder Exercises: Sidelying   External Rotation AROM;Right;10 reps     Shoulder Exercises: Pulleys   Flexion --  10 reps, 10 sec hold, to tolerance    ABduction --  5 reps, 10 sec hold     Modalities   Modalities Moist Heat;Electrical Stimulation     Moist Heat Therapy   Number Minutes Moist Heat 15 Minutes   Moist Heat Location Shoulder     Electrical Stimulation   Electrical Stimulation Location Rt shoulder    Electrical Stimulation Action IFC   Electrical Stimulation Parameters to tolerance    Electrical Stimulation Goals Pain      Education:  Pt issued handout with shoulder ext, shoulder row with yellow band.   Pt verbalized understanding.             PT Short Term Goals - 12/07/16 1510      PT SHORT  TERM GOAL #1   Title Patient independent in initial HEP 01/04/17   Time 6   Period Weeks   Status On-going     PT SHORT TERM GOAL #2   Title Begin strengthening exercise per protocol 01/04/17   Time 6   Period Weeks   Status On-going     PT SHORT TERM GOAL #3   Title Patient to use Rt UE for dressing and bathing as well as eating and light kitchen activities that do not require lifting 12/18/16   Time 6   Period Weeks   Status On-going           PT Long Term Goals - 12/14/16 1519      PT LONG TERM GOAL #1   Title Improve posture and alignment with patient to demonstrate improved posterior shoulder girdle facilitation for improved posture and alignment 02/15/17   Time 12   Period Weeks   Status On-going     PT LONG TERM GOAL #2   Title AROM Rt shoulder equal or greater than AROM Lt shoudler 02/15/17   Time 12   Period Weeks   Status On-going     PT LONG TERM GOAL #3    Title 4+/5 to 5/5 strength Rt UE 02/15/17   Time 12   Period Weeks   Status On-going     PT LONG TERM GOAL #4   Title Independent in advanced HEP for discharge 02/15/17   Time 12   Period Weeks   Status On-going     PT LONG TERM GOAL #5   Title Improve FOTO to </= 42 % limitation 02/15/17   Time 12   Period Weeks   Status On-going               Plan - 01/05/17 1449    Clinical Impression Statement Pt demonstrated improved Rt shoulder ROM.  some pain during stretches; reduced with MHP and estim at end.  Her shoulder ER ROM has improved, but musculature is weak; sidelying AROM of this motion very challenging.  Pt progressing towards goals.    Rehab Potential Good   PT Frequency 2x / week   PT Duration 12 weeks   PT Treatment/Interventions Neuromuscular re-education;ADLs/Self Care Home Management;Electrical Stimulation;Cryotherapy;Iontophoresis 4mg /ml Dexamethasone;Moist Heat;Ultrasound;Dry needling;Manual techniques;Therapeutic activities;Therapeutic exercise   PT Next Visit Plan Continue progressive ROM/strengthening of Rt shoulder per protocol.  Modalities as needed. Manual work including stretching.    Consulted and Agree with Plan of Care Patient      Patient will benefit from skilled therapeutic intervention in order to improve the following deficits and impairments:  Postural dysfunction, Improper body mechanics, Pain, Decreased range of motion, Decreased mobility, Decreased strength, Impaired UE functional use, Decreased activity tolerance  Visit Diagnosis: Acute pain of right shoulder  Other symptoms and signs involving the musculoskeletal system  Weakness generalized     Problem List Patient Active Problem List   Diagnosis Date Noted  . Pulmonary nodules 12/26/2016  . Pain in joint, shoulder region 12/25/2016  . Other chronic pain 11/03/2016  . Thyroid nodule 03/30/2016  . Solitary pulmonary nodule 03/30/2016  . Strain of gluteus medius 10/07/2015  .  Anxiety and depression 09/29/2015  . Hip pain, acute 08/31/2015  . Vitamin D insufficiency 04/08/2015  . Elevated vitamin B12 level 04/08/2015  . Hirsutism 01/28/2015  . Right knee DJD 12/17/2014  . Osteoarthritis of right knee 11/04/2014  . Pain in right hip 08/21/2014  . Hair loss 08/21/2014  . Hypothyroidism 07/24/2014  .  Vasomotor flushing 03/05/2014  . Intrinsic asthma 12/17/2013  . Spinal stenosis of lumbar region 10/14/2013  . Coffee ground emesis 03/06/2013  . Disorders of bursae and tendons in shoulder region, unspecified (LEFT) 03/01/2013  . Anal fissure 02/22/2013  . Diverticulosis of colon without hemorrhage 02/22/2013  . Hyperglycemia 02/01/2013  . Essential hypertension, benign 01/31/2013  . CAP (community acquired pneumonia) 01/31/2013  . Osteoporosis 01/31/2013  . HSV infection 01/31/2013   Kerin Perna, PTA 01/05/17 6:08 PM  [place], [name], [address] Phone: [phone]   Fax:  [phone]  Name: Gwen Wogoman MRN: [id number] Date of Birth: 1946-03-02

## 2017-01-12 ENCOUNTER — Encounter: Payer: Medicare HMO | Admitting: Physical Therapy

## 2017-01-25 ENCOUNTER — Ambulatory Visit (INDEPENDENT_AMBULATORY_CARE_PROVIDER_SITE_OTHER): Payer: Medicare HMO | Admitting: Physical Therapy

## 2017-01-25 DIAGNOSIS — M25511 Pain in right shoulder: Secondary | ICD-10-CM

## 2017-01-25 DIAGNOSIS — R531 Weakness: Secondary | ICD-10-CM | POA: Diagnosis not present

## 2017-01-25 DIAGNOSIS — R29898 Other symptoms and signs involving the musculoskeletal system: Secondary | ICD-10-CM | POA: Diagnosis not present

## 2017-01-25 NOTE — Therapy (Signed)
[place], [name], [address] Phone: [phone]   Fax:  [phone]  Physical Therapy Treatment  Patient Details  Name: Margaret [name] MRN: [id number] Date of Birth: Apr 17, 1946 Referring Provider: Dr. Creig Hines  Encounter Date: 01/25/2017      PT End of Session - 01/25/17 1439    Visit Number 6   Number of Visits 24   Date for PT Re-Evaluation 02/15/17   PT Start Time 1435   PT Stop Time 1525   PT Time Calculation (min) 50 min   Activity Tolerance Patient limited by pain   Behavior During Therapy [facility] for tasks assessed/performed      Past Medical History:  Diagnosis Date  . Asthma   . Hypertension     Past Surgical History:  Procedure Laterality Date  . ABDOMINAL HYSTERECTOMY    . JOINT REPLACEMENT     LT KNEE  . TOTAL KNEE ARTHROPLASTY  2006    There were no vitals filed for this visit.      Subjective Assessment - 01/25/17 1439    Subjective Pt reports she has been doing her exercises at home.  She reports she can lift her arms with pulleys, but she can't seem to raise her arm on its own.  She has been sleeping in sling to decrease pain with sleeping.    Currently in Pain? No/denies  up to 10/10 with waking in night, or certain motions.    Pain Location Shoulder   Pain Orientation Right            [facility] PT Assessment - 01/25/17 0001      Assessment   Medical Diagnosis Rt shoulder pain    Referring Provider Dr. Creig Hines   Onset Date/Surgical Date 11/07/16   Hand Dominance Right   Next MD Visit 01/31/17     AROM   Right/Left Shoulder Right   Right Shoulder Flexion 55 Degrees  deg standing; 157 deg supine with assist.   Right Shoulder ABduction 50 Degrees  standing   Right Shoulder External Rotation 85 Degrees  supine, shoulder abdct 90 deg          [facility] Adult PT Treatment/Exercise - 01/25/17 0001      Shoulder Exercises: Supine   External Rotation Right;10  reps;AAROM  AAROM with cane.  then hands behind head pressing down elbow   Flexion AAROM;Both;10 reps  with cane   Flexion Limitations then bench press with cane (AROM) x 10 reps      Shoulder Exercises: Seated   Extension Both;10 reps;Theraband   Theraband Level (Shoulder Extension) Level 1 (Yellow)   Row Strengthening;Both;12 reps   Theraband Level (Shoulder Row) Level 1 (Yellow)   Internal Rotation Strengthening;Right;Theraband;12 reps   Theraband Level (Shoulder Internal Rotation) Level 1 (Yellow)   Flexion Right  8 reps, [place] 4; limited tolerance   Theraband Level (Shoulder Flexion) Level 1 (Yellow)     Shoulder Exercises: Sidelying   External Rotation AAROM;Right;10 reps   External Rotation Limitations Assist to complete ROM.      Shoulder Exercises: Standing   Internal Rotation Both;10 reps   Theraband Level (Shoulder Internal Rotation) Level 1 (Yellow)   Flexion Strengthening;Right;10 reps;Theraband  [place] 4   Theraband Level (Shoulder Flexion) Level 1 (Yellow)   Flexion Limitations very limited.      Shoulder Exercises: Pulleys   Flexion 3 minutes   ABduction 2 minutes     Shoulder Exercises: Isometric Strengthening   Flexion --  verbally reviewed all isometrics     Modalities   Modalities Electrical Stimulation     Moist Heat Therapy   Number Minutes Moist Heat 15 Minutes   Moist Heat Location Shoulder     Electrical Stimulation   Electrical Stimulation Location Rt shoulder    Electrical Stimulation Action IFC   Electrical Stimulation Parameters to tolerance    Electrical Stimulation Goals Pain                  PT Short Term Goals - 12/07/16 1510      PT SHORT TERM GOAL #1   Title Patient independent in initial HEP 01/04/17   Time 6   Period Weeks   Status On-going     PT SHORT TERM GOAL #2   Title Begin strengthening exercise per protocol 01/04/17   Time 6   Period Weeks   Status On-going     PT SHORT TERM GOAL #3   Title  Patient to use Rt UE for dressing and bathing as well as eating and light kitchen activities that do not require lifting 12/18/16   Time 6   Period Weeks   Status On-going           PT Long Term Goals - 12/14/16 1519      PT LONG TERM GOAL #1   Title Improve posture and alignment with patient to demonstrate improved posterior shoulder girdle facilitation for improved posture and alignment 02/15/17   Time 12   Period Weeks   Status On-going     PT LONG TERM GOAL #2   Title AROM Rt shoulder equal or greater than AROM Lt shoudler 02/15/17   Time 12   Period Weeks   Status On-going     PT LONG TERM GOAL #3   Title 4+/5 to 5/5 strength Rt UE 02/15/17   Time 12   Period Weeks   Status On-going     PT LONG TERM GOAL #4   Title Independent in advanced HEP for discharge 02/15/17   Time 12   Period Weeks   Status On-going     PT LONG TERM GOAL #5   Title Improve FOTO to </= 42 % limitation 02/15/17   Time 12   Period Weeks   Status On-going               Plan - 01/25/17 1527    Clinical Impression Statement Pt demonstrated very limited Rt shoulder AROM today compared to last visit.  With AAROM she is able to reach 154 deg shoulder flex and abduction ROM without pain, but with AROM only 50-55 deg with pain.   She had limited tolerance with exercises today due to pain.  HEP remains with AAROM and isometrics.   Rehab Potential Good   PT Frequency 2x / week   PT Duration 12 weeks   PT Treatment/Interventions Neuromuscular re-education;ADLs/Self Care Home Management;Electrical Stimulation;Cryotherapy;Iontophoresis 4mg /ml Dexamethasone;Moist Heat;Ultrasound;Dry needling;Manual techniques;Therapeutic activities;Therapeutic exercise   PT Next Visit Plan Await any changes to protocol after MD visit.     Consulted and Agree with Plan of Care Patient      Patient will benefit from skilled therapeutic intervention in order to improve the following deficits and impairments:  Postural  dysfunction, Improper body mechanics, Pain, Decreased range of motion, Decreased mobility, Decreased strength, Impaired UE functional use, Decreased activity tolerance  Visit Diagnosis: Acute pain of right shoulder  Other symptoms and signs involving the musculoskeletal system  Weakness generalized  Problem List Patient Active Problem List   Diagnosis Date Noted  . Pulmonary nodules 12/26/2016  . Pain in joint, shoulder region 12/25/2016  . Other chronic pain 11/03/2016  . Thyroid nodule 03/30/2016  . Solitary pulmonary nodule 03/30/2016  . Strain of gluteus medius 10/07/2015  . Anxiety and depression 09/29/2015  . Hip pain, acute 08/31/2015  . Vitamin D insufficiency 04/08/2015  . Elevated vitamin B12 level 04/08/2015  . Hirsutism 01/28/2015  . Right knee DJD 12/17/2014  . Osteoarthritis of right knee 11/04/2014  . Pain in right hip 08/21/2014  . Hair loss 08/21/2014  . Hypothyroidism 07/24/2014  . Vasomotor flushing 03/05/2014  . Intrinsic asthma 12/17/2013  . Spinal stenosis of lumbar region 10/14/2013  . Coffee ground emesis 03/06/2013  . Disorders of bursae and tendons in shoulder region, unspecified (LEFT) 03/01/2013  . Anal fissure 02/22/2013  . Diverticulosis of colon without hemorrhage 02/22/2013  . Hyperglycemia 02/01/2013  . Essential hypertension, benign 01/31/2013  . CAP (community acquired pneumonia) 01/31/2013  . Osteoporosis 01/31/2013  . HSV infection 01/31/2013   Kerin Perna, PTA 01/25/17 3:41 PM  [facility] [place], [name], [address] Phone: [phone]   Fax:  [phone]  Name: Pennylane Mccory MRN: [id number] Date of Birth: 1946/07/08

## 2017-01-30 ENCOUNTER — Encounter: Payer: Self-pay | Admitting: Osteopathic Medicine

## 2017-01-31 DIAGNOSIS — H2513 Age-related nuclear cataract, bilateral: Secondary | ICD-10-CM | POA: Diagnosis not present

## 2017-01-31 DIAGNOSIS — H33322 Round hole, left eye: Secondary | ICD-10-CM | POA: Diagnosis not present

## 2017-02-02 ENCOUNTER — Telehealth: Payer: Self-pay | Admitting: Osteopathic Medicine

## 2017-02-02 ENCOUNTER — Ambulatory Visit (INDEPENDENT_AMBULATORY_CARE_PROVIDER_SITE_OTHER): Payer: Medicare HMO | Admitting: Family Medicine

## 2017-02-02 ENCOUNTER — Encounter: Payer: Self-pay | Admitting: Family Medicine

## 2017-02-02 ENCOUNTER — Other Ambulatory Visit: Payer: Self-pay | Admitting: Osteopathic Medicine

## 2017-02-02 VITALS — BP 126/77 | HR 82 | Wt 127.0 lb

## 2017-02-02 DIAGNOSIS — M25551 Pain in right hip: Secondary | ICD-10-CM | POA: Diagnosis not present

## 2017-02-02 DIAGNOSIS — Z1239 Encounter for other screening for malignant neoplasm of breast: Secondary | ICD-10-CM

## 2017-02-02 DIAGNOSIS — E039 Hypothyroidism, unspecified: Secondary | ICD-10-CM

## 2017-02-02 DIAGNOSIS — I10 Essential (primary) hypertension: Secondary | ICD-10-CM

## 2017-02-02 NOTE — Patient Instructions (Signed)
Thank you for coming in today. Do the PT exercises.  Continue the heating pad and TENS unit.  Recheck in 4-6 weeks or sooner if needed.

## 2017-02-02 NOTE — Progress Notes (Signed)
Margaret [name] is a 71 y.o. female who presents to [place]: [place] today for right lateral hip pain.  Donnah was originally seen last in November for right lateral hip pain due to insertional gluteus medius tendinopathy. She received a steroid injection as well as a course of home exercises. This worked until recently. She notes that she's not been doing her home exercises very much. She notes return of right lateral hip pain especially when she lies on that right side. She denies any radiating pain weakness or numbness. She has tried using a heating pad and a TENS unit which do help. She notes that she's been weaning off of morphine prescribed by pain management for chronic back pain. She's feeling better off of morphine.   Past Medical History:  Diagnosis Date  . Asthma   . Hypertension    Past Surgical History:  Procedure Laterality Date  . ABDOMINAL HYSTERECTOMY    . JOINT REPLACEMENT     LT KNEE  . TOTAL KNEE ARTHROPLASTY  2006   Social History  Substance Use Topics  . Smoking status: Former Smoker    Quit date: 02/01/2008  . Smokeless tobacco: Never Used  . Alcohol use Yes     Comment: SOCIALLY   family history includes Emphysema in her mother; Stroke in her other.  ROS as above:  Medications: Current Outpatient Prescriptions  Medication Sig Dispense Refill  . albuterol (PROVENTIL HFA;VENTOLIN HFA) 108 (90 Base) MCG/ACT inhaler Inhale 1-2 puffs into the lungs every 4 (four) hours as needed for wheezing. 1 Inhaler 0  . amLODipine (NORVASC) 10 MG tablet Take 1 tablet (10 mg total) by mouth daily. 90 tablet 3  . azithromycin (ZITHROMAX Z-PAK) 250 MG tablet Take 2 tablets (500 mg) on  Day 1,  followed by 1 tablet (250 mg) once daily on Days 2 through 5. 6 tablet 0  . FLUoxetine (PROZAC) 40 MG capsule Take 2 capsules (80 mg total) by mouth daily. 180 capsule 3  .  levothyroxine (SYNTHROID, LEVOTHROID) 50 MCG tablet Take 1 tablet (50 mcg total) by mouth daily before breakfast. 90 tablet 1  . linaclotide (LINZESS) 145 MCG CAPS capsule Take 1 capsule (145 mcg total) by mouth daily. To help with constipation. 90 capsule 3  . losartan (COZAAR) 100 MG tablet Take 1 tablet (100 mg total) by mouth daily. 90 tablet 3  . meloxicam (MOBIC) 15 MG tablet Take 15 mg by mouth daily.    [name] morphine (MSIR) 15 MG tablet Take 15 mg by mouth every 4 (four) hours as needed for severe pain.    . predniSONE (DELTASONE) 50 MG tablet One tab PO daily for 5 days. 5 tablet 0  . SPIRIVA RESPIMAT 2.5 MCG/ACT AERS USE AS DIRECTED 4 g 0   No current facility-administered medications for this visit.    Allergies  Allergen Reactions  . Evista [Raloxifene]     Back pain  . Floxin [Ofloxacin] Hives  . Neosporin [Neomycin-Polymyxin-Gramicidin] Hives  . Other     ANTIBIOTIC OINT - UNSURE OF NAME   . Tetracyclines & Related     Health Maintenance Health Maintenance  Topic Date Due  . MAMMOGRAM  06/17/2016  . TETANUS/TDAP  10/30/2021  . COLONOSCOPY  12/31/2022  . INFLUENZA VACCINE  Completed  . DEXA SCAN  Completed  . Hepatitis C Screening  Completed  . PNA vac Low Risk Adult  Completed     Exam:  BP 126/77   Pulse 82   Wt 127 lb (57.6 kg)   BMI 23.61 kg/m  Gen: Well NAD HEENT: EOMI,  MMM Lungs: Normal work of breathing. CTABL Heart: RRR no MRG Abd: NABS, Soft. Nondistended, Nontender Exts: Brisk capillary refill, warm and well perfused.  Right hip: Tender to palpation at the posterior aspect of the greater trochanter near the insertion of the gluteus medius and piriformis tendons. Hip abduction strength is weak 4+/5. Normal motion. Pain is reproduced with piriformis stretch.  Study Result   CLINICAL DATA:  Right hip and buttock pain for 6 months.  EXAM: MR OF THE RIGHT HIP WITHOUT CONTRAST  TECHNIQUE: Multiplanar, multisequence MR imaging was performed.  No intravenous contrast was administered.  COMPARISON:  Lumbar radiographs dated 08/31/2015  FINDINGS: Muscles and tendons  Muscles and tendons: There is a tear of anterior aspect of the right gluteus medius tendon. This portion of the tendon is retracted with edema throughout the torn portion of the muscle. This extends from the anterior inferior iliac spine to the insertion on the lateral aspect of the right greater trochanter.  Bones: Osseous structures of the hips appear normal. Lower lumbar fusion.  Articular cartilage and labrum  Articular cartilage:  Normal.  Labrum: Right hip: There are tears of the anterior superior and anterior lateral aspects of the labrum of the right hip. There is an extensive complex paralabral cyst which extends from the posterior superior aspect of the labrum to the anterior superior medial aspect of the labrum.  Left hip: The patient has more extensive paralabral cyst formation due to labral tears at the anterior superior and superior lateral aspects of the joint.  Joint or bursal effusion  Joint effusion:  Normal.  Bursae: There is a tiny amount of fluid in the right iliopsoas bursa. The anterior paralabral cyst on the left is adjacent to the iliopsoas tendon.  Other findings  Miscellaneous: The piriformis muscles appear normal. The sciatic nerves appear to exit without impingement. Hamstring tendon origins are normal.  IMPRESSION: 1. Tear of the anterior fibers of the right gluteus medius muscle. 2. Anterior superior and superior lateral labral tears of both hips with paralabral cysts bilaterally.   Electronically Signed   By: Lorriane Shire M.D.   On: 10/05/2015 17:11        No results found for this or any previous visit (from the past 72 hour(s)). No results found.    Assessment and Plan: 72 y.o. female with right lateral hip pain.  Likely insertional tendinopathy either from the gluteus medius  or piriformis. There is likely some multifactorial causes. She has chronic back pain as well. I think her pain returned because she stopped doing exercises. I think she will benefit from a repeat course of physical therapy including iontophoresis and potentially dry needling. If not better will proceed with injection.   Orders Placed This Encounter  Procedures  . Ambulatory referral to Physical Therapy    Referral Priority:   Routine    Referral Type:   Physical Medicine    Referral Reason:   Specialty Services Required    Requested Specialty:   Physical Therapy    Number of Visits Requested:   1   No orders of the defined types were placed in this encounter.    Discussed warning signs or symptoms. Please see discharge instructions. Patient expresses understanding.

## 2017-02-03 ENCOUNTER — Encounter: Payer: Self-pay | Admitting: Osteopathic Medicine

## 2017-02-06 ENCOUNTER — Ambulatory Visit (INDEPENDENT_AMBULATORY_CARE_PROVIDER_SITE_OTHER): Payer: Medicare HMO | Admitting: *Deleted

## 2017-02-06 ENCOUNTER — Encounter: Payer: Self-pay | Admitting: *Deleted

## 2017-02-06 VITALS — BP 120/84 | HR 83 | Temp 98.0°F | Ht 62.0 in | Wt 127.3 lb

## 2017-02-06 DIAGNOSIS — Z8739 Personal history of other diseases of the musculoskeletal system and connective tissue: Secondary | ICD-10-CM

## 2017-02-06 DIAGNOSIS — Z6823 Body mass index (BMI) 23.0-23.9, adult: Secondary | ICD-10-CM

## 2017-02-06 DIAGNOSIS — Z0000 Encounter for general adult medical examination without abnormal findings: Secondary | ICD-10-CM

## 2017-02-06 DIAGNOSIS — Z538 Procedure and treatment not carried out for other reasons: Secondary | ICD-10-CM | POA: Diagnosis not present

## 2017-02-06 DIAGNOSIS — I10 Essential (primary) hypertension: Secondary | ICD-10-CM | POA: Diagnosis not present

## 2017-02-06 DIAGNOSIS — E039 Hypothyroidism, unspecified: Secondary | ICD-10-CM | POA: Diagnosis not present

## 2017-02-06 DIAGNOSIS — IMO0001 Reserved for inherently not codable concepts without codable children: Secondary | ICD-10-CM

## 2017-02-06 NOTE — Progress Notes (Signed)
Subjective:   Margaret [name] is a 71 y.o. female who presents for an Initial Medicare Annual Wellness Visit. Review of Systems   Cardiac Risk Factors include: advanced age (>56men, >76 women);dyslipidemia;hypertension;sedentary lifestyle    Objective:    Today's Vitals   02/06/17 1436 02/06/17 1508  BP: 120/84   Pulse: 83   Temp: 98 F (36.7 C)   TempSrc: Oral   SpO2: 94%   Weight: 127 lb 4.8 oz (57.7 kg)   Height: 5\' 2"  (1.575 m)   PainSc:  8    Body mass index is 23.28 kg/m. Current Medications (verified) Outpatient Encounter Prescriptions as of 02/06/2017  Medication Sig  . albuterol (PROVENTIL HFA;VENTOLIN HFA) 108 (90 Base) MCG/ACT inhaler Inhale 1-2 puffs into the lungs every 4 (four) hours as needed for wheezing.  [name] amLODipine (NORVASC) 10 MG tablet Take 1 tablet (10 mg total) by mouth daily.  [name] amLODipine (NORVASC) 10 MG tablet Take 10 mg by mouth daily.  [name] FLUoxetine (PROZAC) 40 MG capsule Take 2 capsules (80 mg total) by mouth daily.  [name] FLUoxetine (PROZAC) 40 MG capsule Take 40 mg by mouth daily.  [name] levothyroxine (SYNTHROID, LEVOTHROID) 50 MCG tablet Take 1 tablet (50 mcg total) by mouth daily before breakfast.  . losartan (COZAAR) 100 MG tablet Take 1 tablet (100 mg total) by mouth daily.  . meloxicam (MOBIC) 15 MG tablet Take 15 mg by mouth daily.  [name] morphine (MSIR) 15 MG tablet Take 15 mg by mouth every 4 (four) hours as needed for severe pain.  . montelukast (SINGULAIR) 10 MG tablet Take 10 mg by mouth daily.  . [DISCONTINUED] azithromycin (ZITHROMAX Z-PAK) 250 MG tablet Take 2 tablets (500 mg) on  Day 1,  followed by 1 tablet (250 mg) once daily on Days 2 through 5. (Patient not taking: Reported on 02/06/2017)  . [DISCONTINUED] linaclotide (LINZESS) 145 MCG CAPS capsule Take 1 capsule (145 mcg total) by mouth daily. To help with constipation. (Patient not taking: Reported on 02/06/2017)  . [DISCONTINUED] predniSONE (DELTASONE) 50 MG tablet One tab PO daily for 5 days.  (Patient not taking: Reported on 02/06/2017)  . [DISCONTINUED] SPIRIVA RESPIMAT 2.5 MCG/ACT AERS USE AS DIRECTED (Patient not taking: Reported on 02/06/2017)   No facility-administered encounter medications on file as of 02/06/2017.    Allergies (verified) Tetracyclines & related; Evista [raloxifene]; Floxin [ofloxacin]; Neosporin [neomycin-polymyxin-gramicidin]; and Other   History: Past Medical History:  Diagnosis Date  . Anemia   . Anxiety   . Arthritis   . Asthma   . Cancer ([place])   . COPD (chronic obstructive pulmonary disease) ([place])   . Emphysema of lung ([place])   . GERD (gastroesophageal reflux disease)   . Hypertension   . Osteoporosis   . Thyroid disease    Past Surgical History:  Procedure Laterality Date  . ABDOMINAL HYSTERECTOMY    . BREAST SURGERY    . CESAREAN SECTION    . JOINT REPLACEMENT     LT KNEE  . TOTAL KNEE ARTHROPLASTY  2006   Family History  Problem Relation Age of Onset  . Stroke Other   . Emphysema Mother 46    Death cause was emphysema.  . Alcohol abuse Father   . COPD Sister   . Alcohol abuse Brother 71  . Cirrhosis Brother   . Cancer Maternal Grandmother 93    Stomach  . Stroke Maternal Grandfather 75   Social History   Occupational History  . Not on file.  Social History Main Topics  . Smoking status: Former Smoker    Packs/day: 1.50    Years: 40.00    Quit date: 02/01/2008  . Smokeless tobacco: Never Used  . Alcohol use Yes     Comment: SOCIALLY  . Drug use: No  . Sexual activity: Yes   Tobacco Counseling Counseling given: No  Activities of Daily Living In your present state of health, do you have any difficulty performing the following activities: 02/06/2017  Hearing? N  Vision? N  Difficulty concentrating or making decisions? N  Walking or climbing stairs? N  Dressing or bathing? N  Doing errands, shopping? N  Preparing Food and eating ? N  Using the Toilet? N  In the past six months, have you accidently leaked urine? Y   Do you have problems with loss of bowel control? N  Managing your Medications? N  Managing your Finances? N  Housekeeping or managing your Housekeeping? N  Some recent data might be hidden   Immunizations and Health Maintenance Immunization History  Administered Date(s) Administered  . Influenza,inj,Quad PF,36+ Mos 10/14/2013, 11/04/2014, 08/18/2015, 11/03/2016  . Influenza-Unspecified 10/14/2013, 11/04/2014, 08/18/2015, 11/03/2016  . Pneumococcal Conjugate-13 07/08/2016  . Pneumococcal-Unspecified 09/04/2012  . Tdap 10/31/2011   There are no preventive care reminders to display for this patient.  Patient Care Team: Emeterio Reeve, DO as PCP - General (Osteopathic Medicine)  Indicate any recent Medical Services you may have received from other than [facility] providers in the past year (date may be approximate).     Assessment:   This is a routine wellness examination for [place].   Hearing/Vision screen No exam data present  Dietary issues and exercise activities discussed: Current Exercise Habits: The patient does not participate in regular exercise at present, Exercise limited by: orthopedic condition(s)  Goals      Patient Stated   . Exercise 3x per week (30 min per time) (pt-stated)          She would like to begin going to the fitness center that her insurance pays for for at least 30 minutes for 3 times per week.      Depression Screen PHQ 2/9 Scores 02/06/2017  PHQ - 2 Score 0    Fall Risk Fall Risk  02/06/2017 11/03/2016  Falls in the past year? No No   Cognitive Function: Normal cognitive function per 6CIT score of 0 and conversation with pt. In office today.     6CIT Screen 02/06/2017  What Year? 0 points  What month? 0 points  What time? 0 points  Count back from 20 0 points  Months in reverse 0 points  Repeat phrase 0 points  Total Score 0    Screening Tests Health Maintenance  Topic Date Due  . MAMMOGRAM  04/07/2017 (Originally 06/17/2016)  .  TETANUS/TDAP  10/30/2021  . COLONOSCOPY  12/31/2022  . INFLUENZA VACCINE  Completed  . DEXA SCAN  Completed  . Hepatitis C Screening  Completed  . PNA vac Low Risk Adult  Completed     Plan:   I have personally reviewed and addressed the Medicare Annual Wellness questionnaire and have noted the following in the patient's chart:  A. Medical and social history-Reviewed, discussed, and updated. B. Use of alcohol, tobacco or illicit drugs-Updated.  C. Current medications and supplements-Reviewed, discussed,and updated. D. Functional ability and status-Pt. Is independent and does not require any assistance currently, however, she has noticed that her stress incontinence has increased and she is now wearing a  Poise pad every day.  She has some concerns abut this and will discuss with Dr. Sheppard Coil during her annual PE on Wednesday, 02-08-2017. E.  Nutritional status-BMI within normal range.  Reading today was 23.28. F.Physical activity-She has no current exercise routine, however, she states that her insurance will pay for her to go to a fitness center and she would like to get back into doing some regular, scheduled exercise. G. Advance directives-Discussed.  Declined offer for more information regarding this. H. List of other physicians I.  Hospitalizations, surgeries, and ER visits in previous 12 months J.  [place] to include cognitive, depression-Normal cognitive function per 6CIT score of 0 and conversation in office today. L. Referrals and appointments - Screening mammogram and DEXA scan to be scheduled through [facility].  Orders were placed.    In addition, I have reviewed and discussed with patient certain preventive protocols, quality metrics, and best practice recommendations. A written personalized care plan for preventive services as well as general preventive health recommendations were provided to patient.  Pt. Wants Dr. Sheppard Coil to know that she has been  having some occasional (last occurrence was about a month ago) "coffee ground" vomitus.  She [facility] and is in hopes of getting a "permanent TENS unit."  She also states she has been more tired recently and she will plan to discuss all of this with Dr. Sheppard Coil on Wednesday of this week.  Signed,   Nestor Lewandowsky, RN

## 2017-02-06 NOTE — Patient Instructions (Addendum)
Health maintenance:You are due for a lipid panel today.  We will print that order off for you to take to the lab.  You are also due for your mammogram and bne density scan.  We will schedule these for you before you leave today.  Abnormal screenings: None today.   Patient concerns: Increased urinary incontinence.  You have an appt with Dr. Sheppard Coil on Wednesday of this week, which this can be addressed at that time.  Vomiting up coffee ground vomitus last month.  This can be addressed on Wednesday as well.   Nurse concerns: Vomiting up coffee ground material but I will bring this to Dr. Redgie Grayer attention.     Next PCP appt: Wednesday, 02-08-2017 but your next AWV-S with the nurse health advisor will be in one year.

## 2017-02-07 LAB — CBC WITH DIFFERENTIAL/PLATELET
BASOS PCT: 0 %
Basophils Absolute: 0 cells/uL (ref 0–200)
EOS ABS: 216 {cells}/uL (ref 15–500)
Eosinophils Relative: 2 %
HEMATOCRIT: 38.1 % (ref 35.0–45.0)
Hemoglobin: 12.3 g/dL (ref 11.7–15.5)
LYMPHS PCT: 22 %
Lymphs Abs: 2376 cells/uL (ref 850–3900)
MCH: 30.7 pg (ref 27.0–33.0)
MCHC: 32.3 g/dL (ref 32.0–36.0)
MCV: 95 fL (ref 80.0–100.0)
MONO ABS: 864 {cells}/uL (ref 200–950)
MPV: 9.8 fL (ref 7.5–12.5)
Monocytes Relative: 8 %
NEUTROS PCT: 68 %
Neutro Abs: 7344 cells/uL (ref 1500–7800)
Platelets: 435 10*3/uL — ABNORMAL HIGH (ref 140–400)
RBC: 4.01 MIL/uL (ref 3.80–5.10)
RDW: 14.1 % (ref 11.0–15.0)
WBC: 10.8 10*3/uL (ref 3.8–10.8)

## 2017-02-07 LAB — LIPID PANEL
CHOLESTEROL: 189 mg/dL (ref <200)
HDL: 83 mg/dL (ref >50)
LDL Cholesterol: 81 mg/dL (ref <100)
TRIGLYCERIDES: 124 mg/dL (ref <150)
Total CHOL/HDL Ratio: 2.3 Ratio (ref <5.0)
VLDL: 25 mg/dL (ref <30)

## 2017-02-07 LAB — COMPLETE METABOLIC PANEL WITH GFR
ALBUMIN: 4.6 g/dL (ref 3.6–5.1)
ALK PHOS: 63 U/L (ref 33–130)
ALT: 18 U/L (ref 6–29)
AST: 19 U/L (ref 10–35)
BUN: 17 mg/dL (ref 7–25)
CALCIUM: 10.6 mg/dL — AB (ref 8.6–10.4)
CHLORIDE: 100 mmol/L (ref 98–110)
CO2: 29 mmol/L (ref 20–31)
CREATININE: 0.89 mg/dL (ref 0.60–0.93)
GFR, EST AFRICAN AMERICAN: 76 mL/min (ref >=60)
GFR, Est Non African American: 66 mL/min (ref >=60)
Glucose, Bld: 92 mg/dL (ref 65–99)
Potassium: 4.6 mmol/L (ref 3.5–5.3)
Sodium: 137 mmol/L (ref 135–146)
Total Bilirubin: 0.4 mg/dL (ref 0.2–1.2)
Total Protein: 7.3 g/dL (ref 6.1–8.1)

## 2017-02-07 LAB — TSH: TSH: 1.97 mIU/L

## 2017-02-08 ENCOUNTER — Ambulatory Visit (INDEPENDENT_AMBULATORY_CARE_PROVIDER_SITE_OTHER): Payer: Medicare HMO | Admitting: Osteopathic Medicine

## 2017-02-08 ENCOUNTER — Encounter: Payer: Self-pay | Admitting: Osteopathic Medicine

## 2017-02-08 VITALS — BP 119/82 | HR 77 | Ht 62.0 in | Wt 129.0 lb

## 2017-02-08 DIAGNOSIS — E039 Hypothyroidism, unspecified: Secondary | ICD-10-CM

## 2017-02-08 DIAGNOSIS — Z7189 Other specified counseling: Secondary | ICD-10-CM | POA: Diagnosis not present

## 2017-02-08 DIAGNOSIS — I10 Essential (primary) hypertension: Secondary | ICD-10-CM | POA: Diagnosis not present

## 2017-02-08 DIAGNOSIS — K920 Hematemesis: Secondary | ICD-10-CM | POA: Diagnosis not present

## 2017-02-08 DIAGNOSIS — K2100 Gastro-esophageal reflux disease with esophagitis, without bleeding: Secondary | ICD-10-CM

## 2017-02-08 DIAGNOSIS — N393 Stress incontinence (female) (male): Secondary | ICD-10-CM | POA: Diagnosis not present

## 2017-02-08 MED ORDER — RANITIDINE HCL 300 MG PO TABS: 300.0000 mg | ORAL_TABLET | Freq: Every day | ORAL | 3 refills | Status: DC

## 2017-02-08 NOTE — Patient Instructions (Addendum)
Plan:  GI referral for possible scope  Try Zantac for heartburn  Option for statin medication for heart protection, but I think blood pressure control is more important for you  See printed information on incontinence and insomnia  Plan for nurse visit for blood pressure monitor verification - follow-up depending on that!

## 2017-02-08 NOTE — Progress Notes (Signed)
HPI: Margaret [name] is a 71 y.o. female  who presents to [facility] today, 02/08/17,  for chief complaint of:  Chief Complaint  Patient presents with  . Annual Exam   Incontinence - several months, Slightly taking urine with coughing/sneezing.   Throwing up blood - ? Coffee grounds emesis 1 a few weeks ago. Hx of this in the past and has had EGD for ths maybe 3 years ago. Records reviewed, small esophageal ulceration, gastritis on pathology. Significant heartburn problems that patient has reservations about taking PPI due to possible association with dementia and other long-term side effects. No dizziness. No dark black/tarry stool  Insomnia: Planning to try melatonin, sleep onset insomnia  Labs reviewed in detail with the patient.  Past medical, surgical, social and family history reviewed: Patient Active Problem List   Diagnosis Date Noted  . Pulmonary nodules 12/26/2016  . Shoulder pain, right 12/25/2016  . Other chronic pain 11/03/2016  . Thyroid nodule 03/30/2016  . Solitary pulmonary nodule 03/30/2016  . Strain of gluteus medius 10/07/2015  . Anxiety and depression 09/29/2015  . Hip pain, acute 08/31/2015  . Vitamin D insufficiency 04/08/2015  . Elevated vitamin B12 level 04/08/2015  . Hirsutism 01/28/2015  . Right knee DJD 12/17/2014  . Osteoarthritis of right knee 11/04/2014  . Pain in right hip 08/21/2014  . Hair loss 08/21/2014  . Hypothyroidism 07/24/2014  . Vasomotor flushing 03/05/2014  . Intrinsic asthma 12/17/2013  . Spinal stenosis of lumbar region 10/14/2013  . Coffee ground emesis 03/06/2013  . Disorder of bursae and tendons in shoulder region 03/01/2013  . Anal fissure 02/22/2013  . Diverticulosis of colon without hemorrhage 02/22/2013  . Hyperglycemia 02/01/2013  . Essential hypertension, benign 01/31/2013  . CAP (community acquired pneumonia) 01/31/2013  . Osteoporosis 01/31/2013  . HSV infection 01/31/2013    Past Surgical History:  Procedure Laterality Date  . ABDOMINAL HYSTERECTOMY    . BREAST SURGERY    . CESAREAN SECTION    . JOINT REPLACEMENT     LT KNEE  . TOTAL KNEE ARTHROPLASTY  2006   Social History  Substance Use Topics  . Smoking status: Former Smoker    Packs/day: 1.50    Years: 40.00    Quit date: 02/01/2008  . Smokeless tobacco: Never Used  . Alcohol use Yes     Comment: SOCIALLY   Family History  Problem Relation Age of Onset  . Stroke Other   . Emphysema Mother 25    Death cause was emphysema.  . Alcohol abuse Father   . COPD Sister   . Alcohol abuse Brother 59  . Cirrhosis Brother   . Cancer Maternal Grandmother 74    Stomach  . Stroke Maternal Grandfather 29     Current medication list and allergy/intolerance information reviewed:   Current Outpatient Prescriptions  Medication Sig Dispense Refill  . albuterol (PROVENTIL HFA;VENTOLIN HFA) 108 (90 Base) MCG/ACT inhaler Inhale 1-2 puffs into the lungs every 4 (four) hours as needed for wheezing. 1 Inhaler 0  . amLODipine (NORVASC) 10 MG tablet Take 1 tablet (10 mg total) by mouth daily. 90 tablet 3  . FLUoxetine (PROZAC) 40 MG capsule Take 2 capsules (80 mg total) by mouth daily. 180 capsule 3  . FLUoxetine (PROZAC) 40 MG capsule Take 40 mg by mouth daily.    [name] levothyroxine (SYNTHROID, LEVOTHROID) 50 MCG tablet Take 1 tablet (50 mcg total) by mouth daily before breakfast. 90 tablet 1  . losartan (COZAAR)  100 MG tablet Take 1 tablet (100 mg total) by mouth daily. 90 tablet 3  . meloxicam (MOBIC) 15 MG tablet Take 15 mg by mouth daily.    . montelukast (SINGULAIR) 10 MG tablet Take 10 mg by mouth daily.  2  . morphine (MSIR) 15 MG tablet Take 15 mg by mouth every 4 (four) hours as needed for severe pain.    [name] amLODipine (NORVASC) 10 MG tablet Take 10 mg by mouth daily.     No current facility-administered medications for this visit.    Allergies  Allergen Reactions  . Tetracyclines & Related     Other  reaction(s): Other SERVER INDIGESTION  . Evista [Raloxifene]     Back pain  . Floxin [Ofloxacin] Hives  . Neosporin [Neomycin-Polymyxin-Gramicidin] Hives  . Other     ANTIBIOTIC OINT - UNSURE OF NAME       Review of Systems:  Constitutional:  No  fever, no chills, No recent illness  HEENT: No  headache, no vision change  Cardiac: No  chest pain, No  pressure, No palpitations  Respiratory:  No  shortness of breath. No  Cough  Gastrointestinal: No  abdominal pain, No  nausea, No  blood in stool, No  diarrhea, No  constipation   Musculoskeletal: No new myalgia/arthralgia  Genitourinary: +incontinence, No  abnormal genital bleeding, No abnormal genital discharge  Skin: No  Rash, No other wounds/concerning lesions  HNeurologic: No  weakness, No  dizziness  Psychiatric: No  concerns with depression, No  concerns with anxiety, No sleep problems, No mood problems  Exam:  BP 119/82   Pulse 77   Ht '5\' 2"'  (1.575 m)   Wt 129 lb (58.5 kg)   BMI 23.59 kg/m   Constitutional: VS see above. General Appearance: alert, well-developed, well-nourished, NAD  Eyes: Normal lids and conjunctive, non-icteric sclera  Ears, Nose, Mouth, Throat: MMM, Normal external inspection ears/nares/mouth/lips/gums.   Neck: No masses, trachea midline. No thyroid enlargement.   Respiratory: Normal respiratory effort. no wheeze, no rhonchi, no rales  Cardiovascular: S1/S2 normal, no murmur, no rub/gallop auscultated. RRR. No lower extremity edema.   Gastrointestinal: Nontender, no masses.   Musculoskeletal: Gait normal. No clubbing/cyanosis of digits.   Neurological: Normal balance/coordination. No tremor.  Skin: warm, dry, intact. No rash/ulcer  Psychiatric: Normal judgment/insight. Normal mood and affect. Oriented x3.    Results for orders placed or performed in visit on 02/02/17 (from the past 72 hour(s))  CBC with Differential/Platelet     Status: Abnormal   Collection Time: 02/06/17  4:20  PM  Result Value Ref Range   WBC 10.8 3.8 - 10.8 K/uL   RBC 4.01 3.80 - 5.10 MIL/uL   Hemoglobin 12.3 11.7 - 15.5 g/dL   HCT 38.1 35.0 - 45.0 %   MCV 95.0 80.0 - 100.0 fL   MCH 30.7 27.0 - 33.0 pg   MCHC 32.3 32.0 - 36.0 g/dL   RDW 14.1 11.0 - 15.0 %   Platelets 435 (H) 140 - 400 K/uL   MPV 9.8 7.5 - 12.5 fL   Neutro Abs 7,344 1,500 - 7,800 cells/uL   Lymphs Abs 2,376 850 - 3,900 cells/uL   Monocytes Absolute 864 200 - 950 cells/uL   Eosinophils Absolute 216 15 - 500 cells/uL   Basophils Absolute 0 0 - 200 cells/uL   Neutrophils Relative % 68 %   Lymphocytes Relative 22 %   Monocytes Relative 8 %   Eosinophils Relative 2 %   Basophils Relative  0 %   Smear Review Criteria for review not met   COMPLETE METABOLIC PANEL WITH GFR     Status: Abnormal   Collection Time: 02/06/17  4:20 PM  Result Value Ref Range   Sodium 137 135 - 146 mmol/L   Potassium 4.6 3.5 - 5.3 mmol/L   Chloride 100 98 - 110 mmol/L   CO2 29 20 - 31 mmol/L   Glucose, Bld 92 65 - 99 mg/dL   BUN 17 7 - 25 mg/dL   Creat 0.89 0.60 - 0.93 mg/dL    Comment:   For patients > or = 70 years of age: The upper reference limit for Creatinine is approximately 13% higher for people identified as African-American.      Total Bilirubin 0.4 0.2 - 1.2 mg/dL   Alkaline Phosphatase 63 33 - 130 U/L   AST 19 10 - 35 U/L   ALT 18 6 - 29 U/L   Total Protein 7.3 6.1 - 8.1 g/dL   Albumin 4.6 3.6 - 5.1 g/dL   Calcium 10.6 (H) 8.6 - 10.4 mg/dL   GFR, Est African American 76 >=60 mL/min   GFR, Est Non African American 66 >=60 mL/min  Lipid panel     Status: None   Collection Time: 02/06/17  4:20 PM  Result Value Ref Range   Cholesterol 189 <200 mg/dL   Triglycerides 124 <150 mg/dL   HDL 83 >50 mg/dL   Total CHOL/HDL Ratio 2.3 <5.0 Ratio   VLDL 25 <30 mg/dL   LDL Cholesterol 81 <100 mg/dL  TSH     Status: None   Collection Time: 02/06/17  4:20 PM  Result Value Ref Range   TSH 1.97 mIU/L    Comment:   Reference Range    > or = 20 Years  0.40-4.50   Pregnancy Range First trimester  0.26-2.66 Second trimester 0.55-2.73 Third trimester  0.43-2.91       ASSESSMENT/PLAN:    Coffee ground emesis - Patient declines PPI treatment, will trial Zantac. Reassuring that no anemia and isolated episode, ER precautions reviewed - Plan: Ambulatory referral to Gastroenterology  Essential hypertension, benign  Cardiac risk counseling - ASCVD 10 year risk 10.0% - in shared decision-making patient declines statin at this time  Stress incontinence  Hypothyroidism, unspecified type  Gastroesophageal reflux disease with esophagitis - Plan: ranitidine (ZANTAC) 300 MG tablet    Patient Instructions  Plan:  GI referral for possible scope  Try Zantac for heartburn  Option for statin medication for heart protection, but I think blood pressure control is more important for you  See printed information on incontinence and insomnia  Plan for nurse visit for blood pressure monitor verification - follow-up depending on that!       Visit summary with medication list and pertinent instructions was printed for patient to review. All questions at time of visit were answered - patient instructed to contact office with any additional concerns. ER/RTC precautions were reviewed with the patient. Follow-up plan: Return in about 6 months (around 08/11/2017) for BP followup, sooner if need.  Note: Total time spent 40 minutes, greater than 50% of the visit was spent face-to-face counseling and coordinating care for the following: The primary encounter diagnosis was Coffee ground emesis. Diagnoses of Essential hypertension, benign, Cardiac risk counseling, Stress incontinence, Hypothyroidism, unspecified type, and Gastroesophageal reflux disease with esophagitis were also pertinent to this visit.[name]

## 2017-02-10 ENCOUNTER — Ambulatory Visit (INDEPENDENT_AMBULATORY_CARE_PROVIDER_SITE_OTHER): Payer: Medicare HMO | Admitting: Osteopathic Medicine

## 2017-02-10 ENCOUNTER — Ambulatory Visit (INDEPENDENT_AMBULATORY_CARE_PROVIDER_SITE_OTHER): Payer: Medicare HMO | Admitting: Physical Therapy

## 2017-02-10 VITALS — BP 131/71 | HR 68 | Ht 62.0 in | Wt 129.1 lb

## 2017-02-10 DIAGNOSIS — M25511 Pain in right shoulder: Secondary | ICD-10-CM

## 2017-02-10 DIAGNOSIS — R29898 Other symptoms and signs involving the musculoskeletal system: Secondary | ICD-10-CM | POA: Diagnosis not present

## 2017-02-10 DIAGNOSIS — I10 Essential (primary) hypertension: Secondary | ICD-10-CM

## 2017-02-10 NOTE — Progress Notes (Signed)
   Subjective:    Patient ID: Margaret [name], female    DOB: [birth year], 71 y.o.   MRN: [id number]  HPI  Pt is here for a BP recheck. Pt is here to compare her BP equipment with ours. Pt denies chest pain, shortness of breath, palpitations, headaches, or any problems with medication.    Review of Systems  Respiratory: Negative for shortness of breath.   Cardiovascular: Negative for chest pain and palpitations.  Neurological: Negative for headaches.       Objective:   Physical Exam        Assessment & Plan:  Pt's BP equipment read 124/79. The office BP equipment read 131/71. Her equipment was in good range.

## 2017-02-10 NOTE — Patient Instructions (Signed)
ELBOW: Biceps - Standing    Standing in doorway, place one hand on wall, elbow straight. Lean forward. Hold _30__ seconds. _3__ reps per set, _2__ sets per day, __7_ days per week  Copyright  VHI. All rights reserved.

## 2017-02-10 NOTE — Therapy (Signed)
[place], [name], [address] Phone: [phone]   Fax:  [phone]  Physical Therapy Treatment/Recertification  Patient Details  Name: Margaret [name] MRN: [id number] Date of Birth: [birth year] Referring Provider: Dr Delight Ovens  Encounter Date: 02/10/2017      PT End of Session - 02/10/17 1009    Visit Number 7   Number of Visits 24   Date for PT Re-Evaluation 03/24/17   PT Start Time 0935   PT Stop Time 1025   PT Time Calculation (min) 50 min   Activity Tolerance Patient tolerated treatment well   Behavior During Therapy [facility] for tasks assessed/performed      Past Medical History:  Diagnosis Date  . Anemia   . Anxiety   . Arthritis   . Asthma   . Cancer ([place])   . COPD (chronic obstructive pulmonary disease) ([place])   . Emphysema of lung ([place])   . GERD (gastroesophageal reflux disease)   . Hypertension   . Osteoporosis   . Thyroid disease     Past Surgical History:  Procedure Laterality Date  . ABDOMINAL HYSTERECTOMY    . BREAST SURGERY    . CESAREAN SECTION    . JOINT REPLACEMENT     LT KNEE  . TOTAL KNEE ARTHROPLASTY  2006    There were no vitals filed for this visit.      Subjective Assessment - 02/10/17 0935    Subjective went to MD last Tuesday (2/27), he wants to do another MRI in 5 weeks and wants her to keep attending therapy.   Pertinent History Lt shoulder RCR 2015; LBP and fusion 2014   Patient Stated Goals get rid of sling and to use Rt UE again    Currently in Pain? No/denies            [facility] PT Assessment - 02/10/17 7902      Assessment   Medical Diagnosis Rt shoulder pain    Referring Provider Dr Delight Ovens   Onset Date/Surgical Date 11/07/16   Hand Dominance Right   Next MD Visit unsure-week of 4/9?  6 wks from last appt     AROM   Right/Left Shoulder Right   Right Shoulder Flexion 150 Degrees  supine   Right Shoulder ABduction 155  Degrees  supine   Right Shoulder External Rotation 75 Degrees  supine; ~75 deg abduction                     [facility] Adult PT Treatment/Exercise - 02/10/17 0942      Shoulder Exercises: Prone   Extension Right;15 reps   Horizontal ABduction 1 Right;15 reps     Shoulder Exercises: Standing   External Rotation Limitations   External Rotation Limitations atttempted; pt unable to complete without substitution   Internal Rotation Right;15 reps;Theraband   Theraband Level (Shoulder Internal Rotation) Level 1 (Yellow)   Extension Strengthening;Both;15 reps;Theraband   Theraband Level (Shoulder Extension) Level 1 (Yellow)   Extension Limitations 3 sec hold with scap retraction   Retraction Both;15 reps;Theraband   Theraband Level (Shoulder Retraction) Level 1 (Yellow)   Retraction Limitations 3 sec hold     Shoulder Exercises: Pulleys   Flexion 3 minutes   ABduction 3 minutes   ABduction Limitations scaption     Shoulder Exercises: Stretch   Other Shoulder Stretches standing doorway biceps stretch 3x30 sec R shoulder     Moist Heat Therapy   Number  Minutes Moist Heat 15 Minutes   Moist Heat Location Shoulder     Electrical Stimulation   Electrical Stimulation Location Rt shoulder    Electrical Stimulation Action IFC   Electrical Stimulation Parameters to tolerance   Electrical Stimulation Goals Pain                PT Education - 02/10/17 1008    Education provided Yes   Education Details elbow stretch   Person(s) Educated Patient   Methods Explanation;Demonstration;Handout   Comprehension Verbalized understanding;Returned demonstration          PT Short Term Goals - 02/10/17 1009      PT SHORT TERM GOAL #1   Title Patient independent in initial HEP 01/04/17   Baseline 02/10/17: met per verbal discussion with pt (able to recall all exercises)   Status Achieved     PT SHORT TERM GOAL #2   Title Begin strengthening exercise per protocol 01/04/17    Status Achieved     PT SHORT TERM GOAL #3   Title Patient to use Rt UE for dressing and bathing as well as eating and light kitchen activities that do not require lifting 12/18/16   Status Achieved           PT Long Term Goals - 02/10/17 1010      PT LONG TERM GOAL #1   Title Improve posture and alignment with patient to demonstrate improved posterior shoulder girdle facilitation for improved posture and alignment 03/24/17   Time 6   Period Weeks   Status On-going     PT LONG TERM GOAL #2   Title AROM Rt shoulder equal or greater than AROM Lt shoudler 03/24/17   Time 6   Period Weeks   Status On-going     PT LONG TERM GOAL #3   Title 4+/5 to 5/5 strength Rt UE 03/24/17   Status On-going     PT LONG TERM GOAL #4   Title Independent in advanced HEP for discharge 03/24/17   Status On-going     PT LONG TERM GOAL #5   Title Improve FOTO to </= 42 % limitation 03/24/17   Status On-going               Plan - 02/10/17 1010    Clinical Impression Statement Pt with improved ROM from last session with decreased pain.  No LTGs met due to limited attendance and set back a few weeks ago.  Continue to recommend PT 2x/wk x 6 more weeks (until next MD appt) to maximize rehab potential.  Due to high co pay pt requesting to come biweekly only.  Stressed importance of HEP compliance and pt verbalized understanding.  No LTGs met and all extended x 6 weeks.  Strength may be limited if pt did reinjure R shoulder, but no MRI yet to confirm (plans to have in ~ 5 weeks).   Rehab Potential Fair   PT Frequency Biweekly   PT Duration 6 weeks   PT Treatment/Interventions Neuromuscular re-education;ADLs/Self Care Home Management;Electrical Stimulation;Cryotherapy;Iontophoresis 52m/ml Dexamethasone;Moist Heat;Ultrasound;Dry needling;Manual techniques;Therapeutic activities;Therapeutic exercise   PT Next Visit Plan continue strengthening and ROM as tolerated   Consulted and Agree with Plan of Care  Patient      Patient will benefit from skilled therapeutic intervention in order to improve the following deficits and impairments:  Postural dysfunction, Improper body mechanics, Pain, Decreased range of motion, Decreased mobility, Decreased strength, Impaired UE functional use, Decreased activity tolerance  Visit Diagnosis: Acute pain  of right shoulder - Plan: PT plan of care cert/re-cert  Other symptoms and signs involving the musculoskeletal system - Plan: PT plan of care cert/re-cert     Problem List Patient Active Problem List   Diagnosis Date Noted  . Pulmonary nodules 12/26/2016  . Shoulder pain, right 12/25/2016  . Other chronic pain 11/03/2016  . Thyroid nodule 03/30/2016  . Solitary pulmonary nodule 03/30/2016  . Strain of gluteus medius 10/07/2015  . Anxiety and depression 09/29/2015  . Hip pain, acute 08/31/2015  . Vitamin D insufficiency 04/08/2015  . Elevated vitamin B12 level 04/08/2015  . Hirsutism 01/28/2015  . Right knee DJD 12/17/2014  . Osteoarthritis of right knee 11/04/2014  . Pain in right hip 08/21/2014  . Hair loss 08/21/2014  . Hypothyroidism 07/24/2014  . Vasomotor flushing 03/05/2014  . Intrinsic asthma 12/17/2013  . Spinal stenosis of lumbar region 10/14/2013  . Coffee ground emesis 03/06/2013  . Disorder of bursae and tendons in shoulder region 03/01/2013  . Anal fissure 02/22/2013  . Diverticulosis of colon without hemorrhage 02/22/2013  . Hyperglycemia 02/01/2013  . Essential hypertension, benign 01/31/2013  . CAP (community acquired pneumonia) 01/31/2013  . Osteoporosis 01/31/2013  . HSV infection 01/31/2013      Laureen Abrahams, PT, DPT 02/10/17 10:15 AM    [facility] [place], [name], [address] Phone: [phone]   Fax:  [phone]  Name: Margaret [name] MRN: [id number] Date of Birth: 1945/12/19

## 2017-02-15 DIAGNOSIS — R911 Solitary pulmonary nodule: Secondary | ICD-10-CM | POA: Diagnosis not present

## 2017-02-16 NOTE — Progress Notes (Signed)
I have reviewed the RN's note for Medicare Wellness Visit and agree with contents of the note and plan.  Further recommendations/plan: addressed at followup  Emeterio Reeve, DO

## 2017-02-18 ENCOUNTER — Encounter: Payer: Self-pay | Admitting: Osteopathic Medicine

## 2017-02-18 DIAGNOSIS — E039 Hypothyroidism, unspecified: Secondary | ICD-10-CM

## 2017-02-21 DIAGNOSIS — E039 Hypothyroidism, unspecified: Secondary | ICD-10-CM | POA: Diagnosis not present

## 2017-02-22 LAB — THYROID PANEL WITH TSH
FREE THYROXINE INDEX: 2.8 (ref 1.4–3.8)
T3 Uptake: 32 % (ref 22–35)
T4, Total: 8.6 ug/dL (ref 4.5–12.0)
TSH: 3.33 m[IU]/L

## 2017-02-24 ENCOUNTER — Encounter: Payer: Medicare HMO | Admitting: Rehabilitative and Restorative Service Providers"

## 2017-02-28 DIAGNOSIS — M75101 Unspecified rotator cuff tear or rupture of right shoulder, not specified as traumatic: Secondary | ICD-10-CM | POA: Diagnosis not present

## 2017-03-01 ENCOUNTER — Ambulatory Visit (INDEPENDENT_AMBULATORY_CARE_PROVIDER_SITE_OTHER): Payer: Medicare HMO | Admitting: Physical Therapy

## 2017-03-01 DIAGNOSIS — M25511 Pain in right shoulder: Secondary | ICD-10-CM

## 2017-03-01 DIAGNOSIS — R29898 Other symptoms and signs involving the musculoskeletal system: Secondary | ICD-10-CM

## 2017-03-01 NOTE — Therapy (Signed)
[place], [name], [address] Phone: [phone]   Fax:  [phone]  Physical Therapy Note  Patient Details  Name: Margaret [name] MRN: [id number] Date of Birth: 1946/06/19 Referring Provider: Dr Delight Ovens  Encounter Date: 03/01/2017    Past Medical History:  Diagnosis Date  . Anemia   . Anxiety   . Arthritis   . Asthma   . Cancer ([place])   . COPD (chronic obstructive pulmonary disease) ([place])   . Emphysema of lung ([place])   . GERD (gastroesophageal reflux disease)   . Hypertension   . Osteoporosis   . Thyroid disease     Past Surgical History:  Procedure Laterality Date  . ABDOMINAL HYSTERECTOMY    . BREAST SURGERY    . CESAREAN SECTION    . JOINT REPLACEMENT     LT KNEE  . TOTAL KNEE ARTHROPLASTY  2006    There were no vitals filed for this visit.      Subjective Assessment - 03/01/17 1019    Subjective went to MD yesterday; decision to repeat MRI sooner than 5 weeks, waiting for this to be scheduled.  no pain, unable to actively move shoulder but can do passively   Pertinent History Lt shoulder RCR 2015; LBP and fusion 2014   Patient Stated Goals get rid of sling and to use Rt UE again    Currently in Pain? No/denies           After short discussion with pt about recent MD visit, pt and therapist agreed to hold PT at this time.  Will plan to follow up after results of MRI per MD recommendations.  No charge for visit today.                        PT Short Term Goals - 02/10/17 1009      PT SHORT TERM GOAL #1   Title Patient independent in initial HEP 01/04/17   Baseline 02/10/17: met per verbal discussion with pt (able to recall all exercises)   Status Achieved     PT SHORT TERM GOAL #2   Title Begin strengthening exercise per protocol 01/04/17   Status Achieved     PT SHORT TERM GOAL #3   Title Patient to use Rt UE for dressing and bathing as well  as eating and light kitchen activities that do not require lifting 12/18/16   Status Achieved           PT Long Term Goals - 02/10/17 1010      PT LONG TERM GOAL #1   Title Improve posture and alignment with patient to demonstrate improved posterior shoulder girdle facilitation for improved posture and alignment 03/24/17   Time 6   Period Weeks   Status On-going     PT LONG TERM GOAL #2   Title AROM Rt shoulder equal or greater than AROM Lt shoudler 03/24/17   Time 6   Period Weeks   Status On-going     PT LONG TERM GOAL #3   Title 4+/5 to 5/5 strength Rt UE 03/24/17   Status On-going     PT LONG TERM GOAL #4   Title Independent in advanced HEP for discharge 03/24/17   Status On-going     PT LONG TERM GOAL #5   Title Improve FOTO to </= 42 % limitation 03/24/17   Status On-going  Patient will benefit from skilled therapeutic intervention in order to improve the following deficits and impairments:     Visit Diagnosis: Acute pain of right shoulder  Other symptoms and signs involving the musculoskeletal system     Problem List Patient Active Problem List   Diagnosis Date Noted  . Pulmonary nodules 12/26/2016  . Shoulder pain, right 12/25/2016  . Other chronic pain 11/03/2016  . Thyroid nodule 03/30/2016  . Solitary pulmonary nodule 03/30/2016  . Strain of gluteus medius 10/07/2015  . Anxiety and depression 09/29/2015  . Hip pain, acute 08/31/2015  . Vitamin D insufficiency 04/08/2015  . Elevated vitamin B12 level 04/08/2015  . Hirsutism 01/28/2015  . Right knee DJD 12/17/2014  . Osteoarthritis of right knee 11/04/2014  . Pain in right hip 08/21/2014  . Hair loss 08/21/2014  . Hypothyroidism 07/24/2014  . Vasomotor flushing 03/05/2014  . Intrinsic asthma 12/17/2013  . Spinal stenosis of lumbar region 10/14/2013  . Coffee ground emesis 03/06/2013  . Disorder of bursae and tendons in shoulder region 03/01/2013  . Anal fissure 02/22/2013  .  Diverticulosis of colon without hemorrhage 02/22/2013  . Hyperglycemia 02/01/2013  . Essential hypertension, benign 01/31/2013  . CAP (community acquired pneumonia) 01/31/2013  . Osteoporosis 01/31/2013  . HSV infection 01/31/2013      Laureen Abrahams, PT, DPT 03/01/17 10:27 AM    [facility] [place], [name], [address] Phone: [phone]   Fax:  [phone]  Name: Margaret [name] MRN: [id number] Date of Birth: 11/27/1946

## 2017-03-09 ENCOUNTER — Other Ambulatory Visit: Payer: Self-pay | Admitting: Osteopathic Medicine

## 2017-03-09 DIAGNOSIS — Z1231 Encounter for screening mammogram for malignant neoplasm of breast: Secondary | ICD-10-CM

## 2017-03-10 ENCOUNTER — Other Ambulatory Visit: Payer: Medicare HMO

## 2017-03-10 ENCOUNTER — Ambulatory Visit: Payer: Medicare HMO

## 2017-03-13 ENCOUNTER — Other Ambulatory Visit: Payer: Self-pay | Admitting: Osteopathic Medicine

## 2017-03-13 DIAGNOSIS — I10 Essential (primary) hypertension: Secondary | ICD-10-CM

## 2017-03-14 DIAGNOSIS — K219 Gastro-esophageal reflux disease without esophagitis: Secondary | ICD-10-CM | POA: Diagnosis not present

## 2017-03-14 DIAGNOSIS — K449 Diaphragmatic hernia without obstruction or gangrene: Secondary | ICD-10-CM | POA: Diagnosis not present

## 2017-03-14 DIAGNOSIS — K920 Hematemesis: Secondary | ICD-10-CM | POA: Diagnosis not present

## 2017-03-15 DIAGNOSIS — J441 Chronic obstructive pulmonary disease with (acute) exacerbation: Secondary | ICD-10-CM | POA: Diagnosis not present

## 2017-03-15 DIAGNOSIS — R0602 Shortness of breath: Secondary | ICD-10-CM | POA: Diagnosis not present

## 2017-03-17 ENCOUNTER — Ambulatory Visit (INDEPENDENT_AMBULATORY_CARE_PROVIDER_SITE_OTHER): Payer: Medicare HMO | Admitting: Physical Therapy

## 2017-03-17 DIAGNOSIS — R531 Weakness: Secondary | ICD-10-CM

## 2017-03-17 DIAGNOSIS — M25511 Pain in right shoulder: Secondary | ICD-10-CM | POA: Diagnosis not present

## 2017-03-17 DIAGNOSIS — R29898 Other symptoms and signs involving the musculoskeletal system: Secondary | ICD-10-CM

## 2017-03-17 NOTE — Therapy (Signed)
[place], [name], [address] Phone: [phone]   Fax:  [phone]  Physical Therapy Treatment  Patient Details  Name: Margaret [name] MRN: [id number] Date of Birth: 04-Jul-1946 Referring Provider: Dr Delight Ovens  Encounter Date: 03/17/2017      PT End of Session - 03/17/17 1057    Visit Number 9  updated due to missed visit   Number of Visits 16   Date for PT Re-Evaluation 04/14/17   PT Start Time 6063   PT Stop Time 1111   PT Time Calculation (min) 56 min   Activity Tolerance Patient tolerated treatment well   Behavior During Therapy [facility] for tasks assessed/performed      Past Medical History:  Diagnosis Date  . Anemia   . Anxiety   . Arthritis   . Asthma   . Cancer ([place])   . COPD (chronic obstructive pulmonary disease) ([place])   . Emphysema of lung ([place])   . GERD (gastroesophageal reflux disease)   . Hypertension   . Osteoporosis   . Thyroid disease     Past Surgical History:  Procedure Laterality Date  . ABDOMINAL HYSTERECTOMY    . BREAST SURGERY    . CESAREAN SECTION    . JOINT REPLACEMENT     LT KNEE  . TOTAL KNEE ARTHROPLASTY  2006    There were no vitals filed for this visit.      Subjective Assessment - 03/17/17 1017    Subjective States insurance won't cover MRI until she has 16 visits of PT.     Pertinent History Lt shoulder RCR 2015; LBP and fusion 2014   Patient Stated Goals get rid of sling and to use Rt UE again    Currently in Pain? Yes   Pain Score 6    Pain Location Shoulder   Pain Orientation Right   Pain Descriptors / Indicators Aching   Pain Type Chronic pain;Surgical pain   Pain Onset More than a month ago   Pain Frequency Intermittent   Aggravating Factors  movement   Pain Relieving Factors medication            [facility] PT Assessment - 03/17/17 1020      Assessment   Medical Diagnosis Rt shoulder pain    Referring Provider Dr Delight Ovens   Onset Date/Surgical Date 11/07/16   Hand Dominance Right     Observation/Other Assessments   Focus on Therapeutic Outcomes (FOTO)  42 (58% limited)     AROM   Overall AROM Comments measured supine   Right/Left Shoulder Right   Right Shoulder Flexion 125 Degrees   Right Shoulder ABduction 87 Degrees   Right Shoulder Internal Rotation 45 Degrees   Right Shoulder External Rotation 77 Degrees     PROM   Right Shoulder Flexion 130 Degrees   Right Shoulder ABduction 93 Degrees   Right Shoulder Internal Rotation 56 Degrees   Right Shoulder External Rotation 81 Degrees     Strength   Overall Strength Comments in prone pt unable to perform external rotation; min active resistance in seated with external rotation; in sitting shoulder flexion to ~ 80 degrees with substitution patterns noted                     [facility] Adult PT Treatment/Exercise - 03/17/17 1032      Shoulder Exercises: Supine   External Rotation Right;10 reps;AAROM  with cane   Flexion AAROM;Both;10  reps  with cane   Flexion Limitations then bench press with cane (AROM) x 10 reps      Shoulder Exercises: Standing   Other Standing Exercises wall ladder x 10 Rt     Shoulder Exercises: Pulleys   Flexion 3 minutes   ABduction 3 minutes   ABduction Limitations scaption     Modalities   Modalities Electrical Stimulation;Moist Heat     Moist Heat Therapy   Number Minutes Moist Heat 15 Minutes   Moist Heat Location Shoulder     Electrical Stimulation   Electrical Stimulation Location Rt shoulder    Electrical Stimulation Action IFC   Electrical Stimulation Parameters to tolerance   Electrical Stimulation Goals Pain                  PT Short Term Goals - 02/10/17 1009      PT SHORT TERM GOAL #1   Title Patient independent in initial HEP 01/04/17   Baseline 02/10/17: met per verbal discussion with pt (able to recall all exercises)   Status Achieved     PT SHORT TERM GOAL #2    Title Begin strengthening exercise per protocol 01/04/17   Status Achieved     PT SHORT TERM GOAL #3   Title Patient to use Rt UE for dressing and bathing as well as eating and light kitchen activities that do not require lifting 12/18/16   Status Achieved           PT Long Term Goals - 03/17/17 1234      PT LONG TERM GOAL #1   Title Improve posture and alignment with patient to demonstrate improved posterior shoulder girdle facilitation for improved posture and alignment 04/14/17   Time 4   Period Weeks   Status On-going     PT LONG TERM GOAL #2   Title AROM Rt shoulder equal or greater than AROM Lt shoulder 04/14/17   Time 4   Period Weeks   Status On-going     PT LONG TERM GOAL #3   Title 4+/5 to 5/5 strength Rt UE 04/14/17   Time 4   Period Weeks   Status On-going     PT LONG TERM GOAL #4   Title Independent in advanced HEP for discharge 04/14/17   Time 4   Period Weeks   Status On-going     PT LONG TERM GOAL #5   Title Improve FOTO to </= 42 % limitation 04/14/17   Time 4   Period Weeks   Status On-going               Plan - 03/17/17 1236    Clinical Impression Statement Pt returns to therapy due to denial of MRI from insurance stating pt needed to complete 16 visits.  Pt continues to demonstrate ROM limitations and significant strength deficits suggestive of reinjury to Rt shoulder.  Will plan to see 2x/wk x 4 more weeks to see if ROM and strength improves.  If no progress with PT will recommend follow up with MD.   Rehab Potential Fair   PT Frequency 2x / week   PT Duration 4 weeks   PT Treatment/Interventions Neuromuscular re-education;ADLs/Self Care Home Management;Electrical Stimulation;Cryotherapy;Iontophoresis 31m/ml Dexamethasone;Moist Heat;Ultrasound;Dry needling;Manual techniques;Therapeutic activities;Therapeutic exercise   PT Next Visit Plan continue strengthening and ROM as tolerated   Consulted and Agree with Plan of Care Patient       Patient will benefit from skilled therapeutic intervention in order to improve the following  deficits and impairments:  Postural dysfunction, Improper body mechanics, Pain, Decreased range of motion, Decreased mobility, Decreased strength, Impaired UE functional use, Decreased activity tolerance  Visit Diagnosis: Acute pain of right shoulder - Plan: PT plan of care cert/re-cert  Other symptoms and signs involving the musculoskeletal system - Plan: PT plan of care cert/re-cert  Weakness generalized - Plan: PT plan of care cert/re-cert     Problem List Patient Active Problem List   Diagnosis Date Noted  . Pulmonary nodules 12/26/2016  . Shoulder pain, right 12/25/2016  . Other chronic pain 11/03/2016  . Thyroid nodule 03/30/2016  . Solitary pulmonary nodule 03/30/2016  . Strain of gluteus medius 10/07/2015  . Anxiety and depression 09/29/2015  . Hip pain, acute 08/31/2015  . Vitamin D insufficiency 04/08/2015  . Elevated vitamin B12 level 04/08/2015  . Hirsutism 01/28/2015  . Right knee DJD 12/17/2014  . Osteoarthritis of right knee 11/04/2014  . Pain in right hip 08/21/2014  . Hair loss 08/21/2014  . Hypothyroidism 07/24/2014  . Vasomotor flushing 03/05/2014  . Intrinsic asthma 12/17/2013  . Spinal stenosis of lumbar region 10/14/2013  . Coffee ground emesis 03/06/2013  . Disorder of bursae and tendons in shoulder region 03/01/2013  . Anal fissure 02/22/2013  . Diverticulosis of colon without hemorrhage 02/22/2013  . Hyperglycemia 02/01/2013  . Essential hypertension, benign 01/31/2013  . CAP (community acquired pneumonia) 01/31/2013  . Osteoporosis 01/31/2013  . HSV infection 01/31/2013      Laureen Abrahams, PT, DPT 03/17/17 12:42 PM    [facility] [place], [name], [address] Phone: [phone]   Fax:  [phone]  Name: Margaret [name] MRN: [id number] Date of Birth:  1946-09-30

## 2017-03-20 DIAGNOSIS — J441 Chronic obstructive pulmonary disease with (acute) exacerbation: Secondary | ICD-10-CM | POA: Diagnosis not present

## 2017-03-20 DIAGNOSIS — R0602 Shortness of breath: Secondary | ICD-10-CM | POA: Diagnosis not present

## 2017-03-20 DIAGNOSIS — R918 Other nonspecific abnormal finding of lung field: Secondary | ICD-10-CM | POA: Diagnosis not present

## 2017-03-20 DIAGNOSIS — J45909 Unspecified asthma, uncomplicated: Secondary | ICD-10-CM | POA: Diagnosis not present

## 2017-03-21 ENCOUNTER — Encounter: Payer: Medicare HMO | Admitting: Physical Therapy

## 2017-03-22 ENCOUNTER — Ambulatory Visit (INDEPENDENT_AMBULATORY_CARE_PROVIDER_SITE_OTHER): Payer: Medicare HMO | Admitting: Physical Therapy

## 2017-03-22 DIAGNOSIS — M25511 Pain in right shoulder: Secondary | ICD-10-CM | POA: Diagnosis not present

## 2017-03-22 DIAGNOSIS — R29898 Other symptoms and signs involving the musculoskeletal system: Secondary | ICD-10-CM | POA: Diagnosis not present

## 2017-03-22 NOTE — Therapy (Addendum)
[place], [name], [address] Phone: [phone]   Fax:  [phone]  Physical Therapy Treatment  Patient Details  Name: Margaret [name] MRN: [id number] Date of Birth: 24-Oct-1946 Referring Provider: Dr Delight Ovens  Encounter Date: 03/22/2017      PT End of Session - 03/22/17 1514    Visit Number 10   Number of Visits 16   Date for PT Re-Evaluation 04/14/17   PT Start Time 9753   PT Stop Time 1514   PT Time Calculation (min) 27 min   Activity Tolerance Patient tolerated treatment well;Patient limited by pain   Behavior During Therapy [facility] for tasks assessed/performed      Past Medical History:  Diagnosis Date  . Anemia   . Anxiety   . Arthritis   . Asthma   . Cancer ([place])   . COPD (chronic obstructive pulmonary disease) ([place])   . Emphysema of lung ([place])   . GERD (gastroesophageal reflux disease)   . Hypertension   . Osteoporosis   . Thyroid disease     Past Surgical History:  Procedure Laterality Date  . ABDOMINAL HYSTERECTOMY    . BREAST SURGERY    . CESAREAN SECTION    . JOINT REPLACEMENT     LT KNEE  . TOTAL KNEE ARTHROPLASTY  2006    There were no vitals filed for this visit.      Subjective Assessment - 03/22/17 1449    Subjective shoulder "is not good today."  reports she's trying to use it and that's causing the pain   Pertinent History Lt shoulder RCR 2015; LBP and fusion 2014   Patient Stated Goals regain use of Rt UE   Currently in Pain? Yes   Pain Score 6    Pain Location Shoulder   Pain Orientation Right   Pain Descriptors / Indicators Aching   Pain Type Chronic pain;Surgical pain   Pain Onset More than a month ago   Pain Frequency Intermittent   Aggravating Factors  movement   Pain Relieving Factors medication            [facility] PT Assessment - 03/22/17 1453      Observation/Other Assessments   Focus on Therapeutic Outcomes (FOTO)  42 (58%  limited)     AROM   Overall AROM Comments measured supine   Right Shoulder Flexion 125 Degrees   Right Shoulder ABduction 87 Degrees   Right Shoulder Internal Rotation 45 Degrees   Right Shoulder External Rotation 77 Degrees     PROM   Right Shoulder Flexion 130 Degrees   Right Shoulder ABduction 93 Degrees   Right Shoulder Internal Rotation 56 Degrees   Right Shoulder External Rotation 81 Degrees     Strength   Overall Strength Comments in prone pt unable to perform external rotation; min active resistance in seated with external rotation; in sitting shoulder flexion to ~ 80 degrees with substitution patterns noted                     [facility] Adult PT Treatment/Exercise - 03/22/17 1451      Shoulder Exercises: Prone   Retraction Right;20 reps   Extension Right;20 reps     Shoulder Exercises: Standing   Flexion AAROM;Right;10 reps   Flexion Limitations wall ladder   Row Limitations standing row with squat 2x10     Shoulder Exercises: Pulleys   Flexion 3 minutes   ABduction 3 minutes  ABduction Limitations scaption                  PT Short Term Goals - 02/10/17 1009      PT SHORT TERM GOAL #1   Title Patient independent in initial HEP 01/04/17   Baseline 02/10/17: met per verbal discussion with pt (able to recall all exercises)   Status Achieved     PT SHORT TERM GOAL #2   Title Begin strengthening exercise per protocol 01/04/17   Status Achieved     PT SHORT TERM GOAL #3   Title Patient to use Rt UE for dressing and bathing as well as eating and light kitchen activities that do not require lifting 12/18/16   Status Achieved           PT Long Term Goals - 03/17/17 1234      PT LONG TERM GOAL #1   Title Improve posture and alignment with patient to demonstrate improved posterior shoulder girdle facilitation for improved posture and alignment 04/14/17   Time 4   Period Weeks   Status On-going     PT LONG TERM GOAL #2   Title AROM Rt  shoulder equal or greater than AROM Lt shoulder 04/14/17   Time 4   Period Weeks   Status On-going     PT LONG TERM GOAL #3   Title 4+/5 to 5/5 strength Rt UE 04/14/17   Time 4   Period Weeks   Status On-going     PT LONG TERM GOAL #4   Title Independent in advanced HEP for discharge 04/14/17   Time 4   Period Weeks   Status On-going     PT LONG TERM GOAL #5   Title Improve FOTO to </= 42 % limitation 04/14/17   Time 4   Period Weeks   Status On-going               Plan - 03/22/17 1514    Clinical Impression Statement Pt continues to have ROM limitation and strength deficits suggestive of reinjury to Rt shoulder.  Using substitution patterns and needs mod to max cues to avoid.  Will continue to benefit from PT to address deficits.   PT Treatment/Interventions Neuromuscular re-education;ADLs/Self Care Home Management;Electrical Stimulation;Cryotherapy;Iontophoresis 5m/ml Dexamethasone;Moist Heat;Ultrasound;Dry needling;Manual techniques;Therapeutic activities;Therapeutic exercise   PT Next Visit Plan continue strengthening and ROM as tolerated   Consulted and Agree with Plan of Care Patient      Patient will benefit from skilled therapeutic intervention in order to improve the following deficits and impairments:  Postural dysfunction, Improper body mechanics, Pain, Decreased range of motion, Decreased mobility, Decreased strength, Impaired UE functional use, Decreased activity tolerance  Visit Diagnosis: Acute pain of right shoulder  Other symptoms and signs involving the musculoskeletal system     Problem List Patient Active Problem List   Diagnosis Date Noted  . Pulmonary nodules 12/26/2016  . Shoulder pain, right 12/25/2016  . Other chronic pain 11/03/2016  . Thyroid nodule 03/30/2016  . Solitary pulmonary nodule 03/30/2016  . Strain of gluteus medius 10/07/2015  . Anxiety and depression 09/29/2015  . Hip pain, acute 08/31/2015  . Vitamin D insufficiency  04/08/2015  . Elevated vitamin B12 level 04/08/2015  . Hirsutism 01/28/2015  . Right knee DJD 12/17/2014  . Osteoarthritis of right knee 11/04/2014  . Pain in right hip 08/21/2014  . Hair loss 08/21/2014  . Hypothyroidism 07/24/2014  . Vasomotor flushing 03/05/2014  . Intrinsic asthma 12/17/2013  . Spinal stenosis  of lumbar region 10/14/2013  . Coffee ground emesis 03/06/2013  . Disorder of bursae and tendons in shoulder region 03/01/2013  . Anal fissure 02/22/2013  . Diverticulosis of colon without hemorrhage 02/22/2013  . Hyperglycemia 02/01/2013  . Essential hypertension, benign 01/31/2013  . CAP (community acquired pneumonia) 01/31/2013  . Osteoporosis 01/31/2013  . HSV infection 01/31/2013      Laureen Abrahams, PT, DPT 03/22/17 3:21 PM    [facility] [place], [name], [address] Phone: [phone]   Fax:  [phone]  Name: Margaret [name] MRN: [id number] Date of Birth: Dec 28, 1945      Physical Therapy Progress Note  Dates of Reporting Period: 11/23/16 to 03/22/17  Objective Reports of Subjective Statement: see above  Objective Measurements: see above  Goal Update: see above  Plan: continue PT x 6 more visits then follow up with MD  Reason Skilled Services are Required: to maximize functional mobility.     Laureen Abrahams, PT, DPT 03/22/17 3:22 PM    [facility] [place], [place] [address]  [phone] (office) [phone] (fax)

## 2017-03-24 ENCOUNTER — Encounter: Payer: Self-pay | Admitting: Rehabilitative and Restorative Service Providers"

## 2017-03-24 ENCOUNTER — Ambulatory Visit (INDEPENDENT_AMBULATORY_CARE_PROVIDER_SITE_OTHER): Payer: Medicare HMO | Admitting: Rehabilitative and Restorative Service Providers"

## 2017-03-24 DIAGNOSIS — R29898 Other symptoms and signs involving the musculoskeletal system: Secondary | ICD-10-CM

## 2017-03-24 DIAGNOSIS — M25511 Pain in right shoulder: Secondary | ICD-10-CM

## 2017-03-24 DIAGNOSIS — R531 Weakness: Secondary | ICD-10-CM

## 2017-03-24 NOTE — Therapy (Signed)
[place], [name], [address] Phone: [phone]   Fax:  [phone]  Physical Therapy Treatment  Patient Details  Name: Margaret [name] MRN: [id number] Date of Birth: October 24, 1946 Referring Provider: Dr Delight Ovens  Encounter Date: 03/24/2017      PT End of Session - 03/24/17 1107    Visit Number 11   Number of Visits 16   Date for PT Re-Evaluation 04/14/17   PT Start Time 1104   PT Stop Time 1139   PT Time Calculation (min) 35 min   Activity Tolerance Patient tolerated treatment well      Past Medical History:  Diagnosis Date  . Anemia   . Anxiety   . Arthritis   . Asthma   . Cancer ([place])   . COPD (chronic obstructive pulmonary disease) ([place])   . Emphysema of lung ([place])   . GERD (gastroesophageal reflux disease)   . Hypertension   . Osteoporosis   . Thyroid disease     Past Surgical History:  Procedure Laterality Date  . ABDOMINAL HYSTERECTOMY    . BREAST SURGERY    . CESAREAN SECTION    . JOINT REPLACEMENT     LT KNEE  . TOTAL KNEE ARTHROPLASTY  2006    There were no vitals filed for this visit.      Subjective Assessment - 03/24/17 1108    Subjective Shoulder hurts "when I try to use it". Trying to keep her shoulder blade down    Currently in Pain? Yes   Pain Score 5    Pain Location Shoulder   Pain Orientation Right   Pain Descriptors / Indicators Aching;Nagging   Pain Type Chronic pain;Surgical pain   Pain Onset More than a month ago   Pain Frequency Intermittent   Aggravating Factors  movement   Pain Relieving Factors medication                          [facility] Adult PT Treatment/Exercise - 03/24/17 0001      Shoulder Exercises: Seated   Other Seated Exercises scapular depression hand on ball pressing in to the ball x 2; small movements fwd/back; side/side; circles CW/CCW x 10 each      Shoulder Exercises: Standing   Extension  Strengthening;Both;Theraband;20 reps   Theraband Level (Shoulder Extension) Level 1 (Yellow)   Row Strengthening;Both;20 reps;Theraband   Theraband Level (Shoulder Row) Level 1 (Yellow)   Row Limitations standing row with squat 2x10   Retraction Both;Theraband;20 reps   Theraband Level (Shoulder Retraction) Level 1 (Yellow)   Other Standing Exercises scap squeeze 10 sec x 10    Other Standing Exercises she flexion sliding hand up inclined treatment table x 20; ball on table and then wall - flex/ext, side to side and circles CW and CCW x 10-20 each      Shoulder Exercises: Pulleys   Flexion 3 minutes   ABduction 3 minutes   ABduction Limitations scaption     Shoulder Exercises: Therapy Ball   Other Therapy Ball Exercises ball on table and ball on wall                   PT Short Term Goals - 02/10/17 1009      PT SHORT TERM GOAL #1   Title Patient independent in initial HEP 01/04/17   Baseline 02/10/17: met per verbal discussion with pt (able to recall all exercises)  Status Achieved     PT SHORT TERM GOAL #2   Title Begin strengthening exercise per protocol 01/04/17   Status Achieved     PT SHORT TERM GOAL #3   Title Patient to use Rt UE for dressing and bathing as well as eating and light kitchen activities that do not require lifting 12/18/16   Status Achieved           PT Long Term Goals - 03/24/17 1108      PT LONG TERM GOAL #1   Title Improve posture and alignment with patient to demonstrate improved posterior shoulder girdle facilitation for improved posture and alignment 04/14/17   Time 4   Period Weeks   Status On-going     PT LONG TERM GOAL #2   Title AROM Rt shoulder equal or greater than AROM Lt shoulder 04/14/17   Time 4   Period Weeks   Status On-going     PT LONG TERM GOAL #3   Title 4+/5 to 5/5 strength Rt UE 04/14/17   Time 4   Period Weeks   Status On-going     PT LONG TERM GOAL #4   Title Independent in advanced HEP for discharge  04/14/17   Time 4   Period Weeks   Status On-going     PT LONG TERM GOAL #5   Title Improve FOTO to </= 42 % limitation 04/14/17   Time 4   Period Weeks   Status On-going               Plan - 03/24/17 1111    Clinical Impression Statement Continued limited ROM and decreased strength likely due to re-injury Rt shoulder. Awaiting further diagnostic testing. Continued scapular instability and poor movemet patterns. Continued pain with functional activities.    Rehab Potential Fair   PT Frequency 2x / week   PT Duration 4 weeks   PT Treatment/Interventions Neuromuscular re-education;ADLs/Self Care Home Management;Electrical Stimulation;Cryotherapy;Iontophoresis 26m/ml Dexamethasone;Moist Heat;Ultrasound;Dry needling;Manual techniques;Therapeutic activities;Therapeutic exercise   PT Next Visit Plan continue strengthening and ROM as tolerated focus on scapular control - add eccentric lowering from elevation    Consulted and Agree with Plan of Care Patient      Patient will benefit from skilled therapeutic intervention in order to improve the following deficits and impairments:  Postural dysfunction, Improper body mechanics, Pain, Decreased range of motion, Decreased mobility, Decreased strength, Impaired UE functional use, Decreased activity tolerance  Visit Diagnosis: Acute pain of right shoulder  Other symptoms and signs involving the musculoskeletal system  Weakness generalized     Problem List Patient Active Problem List   Diagnosis Date Noted  . Pulmonary nodules 12/26/2016  . Shoulder pain, right 12/25/2016  . Other chronic pain 11/03/2016  . Thyroid nodule 03/30/2016  . Solitary pulmonary nodule 03/30/2016  . Strain of gluteus medius 10/07/2015  . Anxiety and depression 09/29/2015  . Hip pain, acute 08/31/2015  . Vitamin D insufficiency 04/08/2015  . Elevated vitamin B12 level 04/08/2015  . Hirsutism 01/28/2015  . Right knee DJD 12/17/2014  . Osteoarthritis of  right knee 11/04/2014  . Pain in right hip 08/21/2014  . Hair loss 08/21/2014  . Hypothyroidism 07/24/2014  . Vasomotor flushing 03/05/2014  . Intrinsic asthma 12/17/2013  . Spinal stenosis of lumbar region 10/14/2013  . Coffee ground emesis 03/06/2013  . Disorder of bursae and tendons in shoulder region 03/01/2013  . Anal fissure 02/22/2013  . Diverticulosis of colon without hemorrhage 02/22/2013  . Hyperglycemia 02/01/2013  .  Essential hypertension, benign 01/31/2013  . CAP (community acquired pneumonia) 01/31/2013  . Osteoporosis 01/31/2013  . HSV infection 01/31/2013    Celyn Nilda Simmer PT, MPH  03/24/2017, 11:43 AM  [facility] [place], [name], [address] Phone: [phone]   Fax:  [phone]  Name: Margaret [name] MRN: [id number] Date of Birth: 01-11-1946

## 2017-03-29 ENCOUNTER — Encounter: Payer: Medicare HMO | Admitting: Physical Therapy

## 2017-03-30 ENCOUNTER — Ambulatory Visit (INDEPENDENT_AMBULATORY_CARE_PROVIDER_SITE_OTHER): Payer: Medicare HMO | Admitting: Physical Therapy

## 2017-03-30 DIAGNOSIS — R531 Weakness: Secondary | ICD-10-CM

## 2017-03-30 DIAGNOSIS — R29898 Other symptoms and signs involving the musculoskeletal system: Secondary | ICD-10-CM

## 2017-03-30 DIAGNOSIS — M25511 Pain in right shoulder: Secondary | ICD-10-CM | POA: Diagnosis not present

## 2017-03-30 NOTE — Therapy (Signed)
[place], [name], [address] Phone: [phone]   Fax:  [phone]  Physical Therapy Treatment  Patient Details  Name: Margaret [name] MRN: [id number] Date of Birth: January 27, 1946 Referring Provider: Dr. Delight Ovens   Encounter Date: 03/30/2017      PT End of Session - 03/30/17 1022    Visit Number 12   Number of Visits 16   Date for PT Re-Evaluation 04/14/17   PT Start Time 1017   PT Stop Time 1055   PT Time Calculation (min) 38 min   Activity Tolerance Patient tolerated treatment well   Behavior During Therapy [facility] for tasks assessed/performed      Past Medical History:  Diagnosis Date  . Anemia   . Anxiety   . Arthritis   . Asthma   . Cancer ([place])   . COPD (chronic obstructive pulmonary disease) ([place])   . Emphysema of lung ([place])   . GERD (gastroesophageal reflux disease)   . Hypertension   . Osteoporosis   . Thyroid disease     Past Surgical History:  Procedure Laterality Date  . ABDOMINAL HYSTERECTOMY    . BREAST SURGERY    . CESAREAN SECTION    . JOINT REPLACEMENT     LT KNEE  . TOTAL KNEE ARTHROPLASTY  2006    There were no vitals filed for this visit.          [facility] PT Assessment - 03/30/17 0001      Assessment   Medical Diagnosis Rt shoulder pain    Referring Provider Dr. Delight Ovens    Onset Date/Surgical Date 11/07/16   Hand Dominance Right     AROM   Right Shoulder Flexion 90 Degrees  standing, with pain.  132 deg in supine   Right Shoulder ABduction 70 Degrees  standing, painful   Right Shoulder Internal Rotation 50 Degrees  supine   Right Shoulder External Rotation 70 Degrees  supine, abdct to ~60 deg.          [place] [facility] Adult PT Treatment/Exercise - 03/30/17 0001      Shoulder Exercises: Seated   Other Seated Exercises scapular depression hand on ball pressing in to the ball x 10 sec hold x 10 reps; small movements fwd/back; side/side;  circles CW/CCW x 10 each; scap drepression resisting perterbations into ball x 15 sec x 4 reps    Other Seated Exercises shoulder rolls x 10.  Rt shoulder flexion AAROM sliding arm up inclined high/low table x 10 reps x 2 sets.      Shoulder Exercises: Prone   Retraction Right;20 reps   Extension Right;20 reps     Shoulder Exercises: Standing   Internal Rotation AAROM;Both;15 reps  AAROM     Shoulder Exercises: Pulleys   Flexion 3 minutes   ABduction 3 minutes   ABduction Limitations scaption           PT Short Term Goals - 02/10/17 1009      PT SHORT TERM GOAL #1   Title Patient independent in initial HEP 01/04/17   Baseline 02/10/17: met per verbal discussion with pt (able to recall all exercises)   Status Achieved     PT SHORT TERM GOAL #2   Title Begin strengthening exercise per protocol 01/04/17   Status Achieved     PT SHORT TERM GOAL #3   Title Patient to use Rt UE for dressing and bathing as well as eating and light kitchen  activities that do not require lifting 12/18/16   Status Achieved           PT Long Term Goals - 03/30/17 1054      PT LONG TERM GOAL #1   Title Improve posture and alignment with patient to demonstrate improved posterior shoulder girdle facilitation for improved posture and alignment 04/14/17   Time 4   Period Weeks   Status On-going  improving     PT LONG TERM GOAL #2   Title AROM Rt shoulder equal or greater than AROM Lt shoulder 04/14/17   Time 4   Period Weeks   Status On-going     PT LONG TERM GOAL #3   Title 4+/5 to 5/5 strength Rt UE 04/14/17   Time 4   Period Weeks   Status On-going     PT LONG TERM GOAL #4   Title Independent in advanced HEP for discharge 04/14/17   Time 4   Period Weeks   Status On-going     PT LONG TERM GOAL #5   Title Improve FOTO to </= 42 % limitation 04/14/17   Time 4   Period Weeks   Status On-going               Plan - 03/30/17 1055    Clinical Impression Statement Pt continues  with decreased Rt shoulder ROM and strength, likely due to re-injury.  She reported increased pain in Rt shoulder up to 5/10 with exercise today.  Pt declined modalities for pain control due to prior committments after therapy; will do once she is home.  Limited progress towards established goals.    Rehab Potential Fair   PT Frequency 2x / week   PT Duration 4 weeks   PT Treatment/Interventions Neuromuscular re-education;ADLs/Self Care Home Management;Electrical Stimulation;Cryotherapy;Iontophoresis 22m/ml Dexamethasone;Moist Heat;Ultrasound;Dry needling;Manual techniques;Therapeutic activities;Therapeutic exercise   PT Next Visit Plan continue strengthening and ROM as tolerated focus on scapular control - add eccentric lowering from elevation    Consulted and Agree with Plan of Care Patient      Patient will benefit from skilled therapeutic intervention in order to improve the following deficits and impairments:  Postural dysfunction, Improper body mechanics, Pain, Decreased range of motion, Decreased mobility, Decreased strength, Impaired UE functional use, Decreased activity tolerance  Visit Diagnosis: Acute pain of right shoulder  Other symptoms and signs involving the musculoskeletal system  Weakness generalized     Problem List Patient Active Problem List   Diagnosis Date Noted  . Pulmonary nodules 12/26/2016  . Shoulder pain, right 12/25/2016  . Other chronic pain 11/03/2016  . Thyroid nodule 03/30/2016  . Solitary pulmonary nodule 03/30/2016  . Strain of gluteus medius 10/07/2015  . Anxiety and depression 09/29/2015  . Hip pain, acute 08/31/2015  . Vitamin D insufficiency 04/08/2015  . Elevated vitamin B12 level 04/08/2015  . Hirsutism 01/28/2015  . Right knee DJD 12/17/2014  . Osteoarthritis of right knee 11/04/2014  . Pain in right hip 08/21/2014  . Hair loss 08/21/2014  . Hypothyroidism 07/24/2014  . Vasomotor flushing 03/05/2014  . Intrinsic asthma 12/17/2013   . Spinal stenosis of lumbar region 10/14/2013  . Coffee ground emesis 03/06/2013  . Disorder of bursae and tendons in shoulder region 03/01/2013  . Anal fissure 02/22/2013  . Diverticulosis of colon without hemorrhage 02/22/2013  . Hyperglycemia 02/01/2013  . Essential hypertension, benign 01/31/2013  . CAP (community acquired pneumonia) 01/31/2013  . Osteoporosis 01/31/2013  . HSV infection 01/31/2013   JKerin Perna PTA  03/30/17 11:56 AM  [place], [name], [address] Phone: [phone]   Fax:  [phone]  Name: Margaret [name] MRN: [id number] Date of Birth: 1946-02-15

## 2017-03-31 ENCOUNTER — Ambulatory Visit (INDEPENDENT_AMBULATORY_CARE_PROVIDER_SITE_OTHER): Payer: Medicare HMO | Admitting: Physical Therapy

## 2017-03-31 DIAGNOSIS — R29898 Other symptoms and signs involving the musculoskeletal system: Secondary | ICD-10-CM | POA: Diagnosis not present

## 2017-03-31 DIAGNOSIS — R531 Weakness: Secondary | ICD-10-CM | POA: Diagnosis not present

## 2017-03-31 DIAGNOSIS — M25511 Pain in right shoulder: Secondary | ICD-10-CM

## 2017-03-31 NOTE — Therapy (Signed)
[place], [name], [address] Phone: [phone]   Fax:  [phone]  Physical Therapy Treatment  Patient Details  Name: Margaret [name] MRN: [id number] Date of Birth: 1946/11/11 Referring Provider: Dr. Theone Stanley  Encounter Date: 03/31/2017      PT End of Session - 03/31/17 1526    Visit Number 13   Number of Visits 16   Date for PT Re-Evaluation 04/14/17   PT Start Time 1446   PT Stop Time 1526   PT Time Calculation (min) 40 min   Activity Tolerance Patient tolerated treatment well   Behavior During Therapy [facility] for tasks assessed/performed      Past Medical History:  Diagnosis Date  . Anemia   . Anxiety   . Arthritis   . Asthma   . Cancer ([place])   . COPD (chronic obstructive pulmonary disease) ([place])   . Emphysema of lung ([place])   . GERD (gastroesophageal reflux disease)   . Hypertension   . Osteoporosis   . Thyroid disease     Past Surgical History:  Procedure Laterality Date  . ABDOMINAL HYSTERECTOMY    . BREAST SURGERY    . CESAREAN SECTION    . JOINT REPLACEMENT     LT KNEE  . TOTAL KNEE ARTHROPLASTY  2006    There were no vitals filed for this visit.      Subjective Assessment - 03/31/17 1458    Subjective Pt reports she was a little sore after therapy yesterday. she was too busy to do ice/TENS at home last night.  She feels her Rt shoulder feels different, "bigger" than Lt shoulder.    Currently in Pain? Yes   Pain Score 5    Pain Location Shoulder   Pain Orientation Right   Aggravating Factors  movement    Pain Relieving Factors rest, TENS, medication            [facility] PT Assessment - 03/31/17 0001      Assessment   Medical Diagnosis Rt shoulder pain    Referring Provider Dr. Theone Stanley   Onset Date/Surgical Date 11/07/16   Hand Dominance Right          [facility] Adult PT Treatment/Exercise - 03/31/17 0001      Shoulder Exercises: Seated   Other  Seated Exercises scapular depression hand on ball pressing in to the ball x 10 sec hold x 10 reps; small movements fwd/back; side/side; circles CW/CCW x 10 each; scap drepression resisting perterbations into ball x 15 sec x 4 reps    Other Seated Exercises shoulder rolls x 10.  Rt shoulder flexion AAROM sliding arm up inclined high/low table x 10 reps x 2 sets.      Shoulder Exercises: Standing   Other Standing Exercises wall ladder RUE in scaption, up to #19 x 8 reps     Shoulder Exercises: Pulleys   Flexion 2 minutes   ABduction 2 minutes   ABduction Limitations scaption     Shoulder Exercises: Stretch   Table Stretch - Flexion --  10 reps 10 sec hold table slide    Table Stretch -Flexion Limitations (hands on counter top, walking backward. )   Other Shoulder Stretches pendulum      Modalities   Modalities --  pt declined     Manual Therapy   Manual Therapy Myofascial release;Passive ROM   Myofascial Release Lt pec release.    Passive ROM Lt shoulder flex; abduction/ER/IR in scapular  plane;                  PT Short Term Goals - 02/10/17 1009      PT SHORT TERM GOAL #1   Title Patient independent in initial HEP 01/04/17   Baseline 02/10/17: met per verbal discussion with pt (able to recall all exercises)   Status Achieved     PT SHORT TERM GOAL #2   Title Begin strengthening exercise per protocol 01/04/17   Status Achieved     PT SHORT TERM GOAL #3   Title Patient to use Rt UE for dressing and bathing as well as eating and light kitchen activities that do not require lifting 12/18/16   Status Achieved           PT Long Term Goals - 03/30/17 1054      PT LONG TERM GOAL #1   Title Improve posture and alignment with patient to demonstrate improved posterior shoulder girdle facilitation for improved posture and alignment 04/14/17   Time 4   Period Weeks   Status On-going  improving     PT LONG TERM GOAL #2   Title AROM Rt shoulder equal or greater than AROM  Lt shoulder 04/14/17   Time 4   Period Weeks   Status On-going     PT LONG TERM GOAL #3   Title 4+/5 to 5/5 strength Rt UE 04/14/17   Time 4   Period Weeks   Status On-going     PT LONG TERM GOAL #4   Title Independent in advanced HEP for discharge 04/14/17   Time 4   Period Weeks   Status On-going     PT LONG TERM GOAL #5   Title Improve FOTO to </= 42 % limitation 04/14/17   Time 4   Period Weeks   Status On-going               Plan - 03/31/17 1527    Clinical Impression Statement Pt continues to have pain in Rt shoulder with AAROM.  Limited range due to increased pain.  Pt's pain report remained the same throughout session; declined modalities as she will do at home.    Rehab Potential Fair   PT Frequency 2x / week   PT Duration 4 weeks   PT Treatment/Interventions Neuromuscular re-education;ADLs/Self Care Home Management;Electrical Stimulation;Cryotherapy;Iontophoresis 72m/ml Dexamethasone;Moist Heat;Ultrasound;Dry needling;Manual techniques;Therapeutic activities;Therapeutic exercise   PT Next Visit Plan continue strengthening and ROM as tolerated focus on scapular control    Consulted and Agree with Plan of Care Patient      Patient will benefit from skilled therapeutic intervention in order to improve the following deficits and impairments:  Postural dysfunction, Improper body mechanics, Pain, Decreased range of motion, Decreased mobility, Decreased strength, Impaired UE functional use, Decreased activity tolerance  Visit Diagnosis: Acute pain of right shoulder  Other symptoms and signs involving the musculoskeletal system  Weakness generalized     Problem List Patient Active Problem List   Diagnosis Date Noted  . Pulmonary nodules 12/26/2016  . Shoulder pain, right 12/25/2016  . Other chronic pain 11/03/2016  . Thyroid nodule 03/30/2016  . Solitary pulmonary nodule 03/30/2016  . Strain of gluteus medius 10/07/2015  . Anxiety and depression  09/29/2015  . Hip pain, acute 08/31/2015  . Vitamin D insufficiency 04/08/2015  . Elevated vitamin B12 level 04/08/2015  . Hirsutism 01/28/2015  . Right knee DJD 12/17/2014  . Osteoarthritis of right knee 11/04/2014  . Pain in right hip  08/21/2014  . Hair loss 08/21/2014  . Hypothyroidism 07/24/2014  . Vasomotor flushing 03/05/2014  . Intrinsic asthma 12/17/2013  . Spinal stenosis of lumbar region 10/14/2013  . Coffee ground emesis 03/06/2013  . Disorder of bursae and tendons in shoulder region 03/01/2013  . Anal fissure 02/22/2013  . Diverticulosis of colon without hemorrhage 02/22/2013  . Hyperglycemia 02/01/2013  . Essential hypertension, benign 01/31/2013  . CAP (community acquired pneumonia) 01/31/2013  . Osteoporosis 01/31/2013  . HSV infection 01/31/2013   Kerin Perna, PTA 03/31/17 3:37 PM  [facility] [place], [name], [address] Phone: [phone]   Fax:  [phone]  Name: Margaret [name] MRN: [id number] Date of Birth: [birth year]

## 2017-04-04 ENCOUNTER — Ambulatory Visit (INDEPENDENT_AMBULATORY_CARE_PROVIDER_SITE_OTHER): Payer: Medicare HMO

## 2017-04-04 DIAGNOSIS — Z8739 Personal history of other diseases of the musculoskeletal system and connective tissue: Secondary | ICD-10-CM

## 2017-04-04 DIAGNOSIS — M85852 Other specified disorders of bone density and structure, left thigh: Secondary | ICD-10-CM | POA: Diagnosis not present

## 2017-04-04 DIAGNOSIS — Z1231 Encounter for screening mammogram for malignant neoplasm of breast: Secondary | ICD-10-CM

## 2017-04-05 ENCOUNTER — Ambulatory Visit (INDEPENDENT_AMBULATORY_CARE_PROVIDER_SITE_OTHER): Payer: Medicare HMO | Admitting: Physical Therapy

## 2017-04-05 DIAGNOSIS — R531 Weakness: Secondary | ICD-10-CM | POA: Diagnosis not present

## 2017-04-05 DIAGNOSIS — R29898 Other symptoms and signs involving the musculoskeletal system: Secondary | ICD-10-CM | POA: Diagnosis not present

## 2017-04-05 DIAGNOSIS — M25511 Pain in right shoulder: Secondary | ICD-10-CM | POA: Diagnosis not present

## 2017-04-05 NOTE — Patient Instructions (Signed)
Flexion (Passive)    Sitting upright, slide forearm forward along table, bending from the waist until a stretch is felt. Hold _10___ seconds. Repeat _5-10___ times. Do _2___ sessions per day.  Copyright  VHI. All rights reserved.  External Rotation (Passive)    With elbow bent and forearm on table, palm down, bend forward at waist until a stretch is felt. Hold ___10_ seconds. Repeat __5-10__ times. Do __2__ sessions per day.  SHOULDER: Abduction (Isometric)    Have elbow supported, shoulder blade back, press out. Keep elbow straight. Hold __10  seconds. 5-6 x /day.

## 2017-04-05 NOTE — Therapy (Signed)
[place], [name], [address] Phone: [phone]   Fax:  [phone]  Physical Therapy Treatment  Patient Details  Name: Margaret [name] MRN: [id number] Date of Birth: 06-06-1946 Referring Provider: Dr. Theone Stanley  Encounter Date: 04/05/2017      PT End of Session - 04/05/17 0852    Visit Number 14   Number of Visits 16   Date for PT Re-Evaluation 04/14/17   PT Start Time 9741   PT Stop Time 0933   PT Time Calculation (min) 46 min   Behavior During Therapy [facility] for tasks assessed/performed      Past Medical History:  Diagnosis Date  . Anemia   . Anxiety   . Arthritis   . Asthma   . Cancer ([place])   . COPD (chronic obstructive pulmonary disease) ([place])   . Emphysema of lung ([place])   . GERD (gastroesophageal reflux disease)   . Hypertension   . Osteoporosis   . Thyroid disease     Past Surgical History:  Procedure Laterality Date  . ABDOMINAL HYSTERECTOMY    . BREAST SURGERY    . CESAREAN SECTION    . JOINT REPLACEMENT     LT KNEE  . TOTAL KNEE ARTHROPLASTY  2006    There were no vitals filed for this visit.      Subjective Assessment - 04/05/17 0903    Subjective Margaret [name] reports she tripped fell 2 days ago and hit her chin.  She caught herself with her Lt arm. she has been using TENS unit at home on Rt shoulder for pain relief.    Currently in Pain? Yes   Pain Score 6    Pain Location Shoulder   Pain Orientation Right   Pain Descriptors / Indicators Aching;Nagging   Aggravating Factors  movement past comfortable point.    Pain Relieving Factors rest, TENS, medication            [facility] PT Assessment - 04/05/17 0001      PROM   Right Shoulder Flexion 130 Degrees  AAROM with forward trunk flexion          [facility] Adult PT Treatment/Exercise - 04/05/17 0001      Shoulder Exercises: Seated   Other Seated Exercises scapular depression hand on ball pressing in to the ball x  10 sec hold x 10 reps; small movements fwd/back; side/side; circles CW/CCW x 10 each; scap drepression resisting perterbations into ball x 15 sec x 4 reps      Shoulder Exercises: Pulleys   Flexion 2 minutes     Shoulder Exercises: Isometric Strengthening   Flexion --   Extension --   External Rotation scap retraction, supination of forearm, slight shoulder ER x 10 reps - tactile cues for thoracic ext, scap retraction/depression.   Internal Rotation --   ABduction 5X10"  with elbow supported by therapist, 2 sets   ABduction Limitations Rt GH joint approximation prior to engagement of deltoid, tactile cues for neuro- re education of post shoulder girdle engagement.  Multiple reps.       Shoulder Exercises: Stretch   External Rotation Stretch --  10 reps x 10 sec   Table Stretch - Flexion --  10 reps 10 sec hold table slide    Table Stretch -Flexion Limitations 2 reps with table; 8 reps with arms on therapy ball.      Modalities   Modalities --  pt declined     Manual Therapy  Manual Therapy Taping   Manual therapy comments Rock tape I strips applied in vertical position over ant and post deltoid, pulled with 75% upward to help support GH joint and to facilitate deltoid.             PT Education - 04/05/17 1034    Education provided Yes   Education Details HEP.  Also educated on rationale of Rock tape application and safe removal.    Person(s) Educated Patient   Methods Explanation;Handout;Demonstration;Tactile cues;Verbal cues   Comprehension Verbalized understanding;Returned demonstration          PT Short Term Goals - 02/10/17 1009      PT SHORT TERM GOAL #1   Title Patient independent in initial HEP 01/04/17   Baseline 02/10/17: met per verbal discussion with pt (able to recall all exercises)   Status Achieved     PT SHORT TERM GOAL #2   Title Begin strengthening exercise per protocol 01/04/17   Status Achieved     PT SHORT TERM GOAL #3   Title Patient to use Rt  UE for dressing and bathing as well as eating and light kitchen activities that do not require lifting 12/18/16   Status Achieved           PT Long Term Goals - 03/30/17 1054      PT LONG TERM GOAL #1   Title Improve posture and alignment with patient to demonstrate improved posterior shoulder girdle facilitation for improved posture and alignment 04/14/17   Time 4   Period Weeks   Status On-going  improving     PT LONG TERM GOAL #2   Title AROM Rt shoulder equal or greater than AROM Lt shoulder 04/14/17   Time 4   Period Weeks   Status On-going     PT LONG TERM GOAL #3   Title 4+/5 to 5/5 strength Rt UE 04/14/17   Time 4   Period Weeks   Status On-going     PT LONG TERM GOAL #4   Title Independent in advanced HEP for discharge 04/14/17   Time 4   Period Weeks   Status On-going     PT LONG TERM GOAL #5   Title Improve FOTO to </= 42 % limitation 04/14/17   Time 4   Period Weeks   Status On-going               Plan - 04/05/17 5009    Clinical Impression Statement Pt has pain and increased guarding in Rt shoulder with pulleys in flexion >90 deg. Improved motion and less guarding with AAROM of arm on ball, rolling forward. Rt shoulder flexion up to 130 deg in this position.  Pt has visible sulcus in Rt shoulder in sitting.  TRial of Rock tape for mild support of [place] joint.  No goals met this visit.    Rehab Potential Fair   PT Frequency 2x / week   PT Duration 4 weeks   PT Treatment/Interventions Neuromuscular re-education;ADLs/Self Care Home Management;Electrical Stimulation;Cryotherapy;Iontophoresis 62m/ml Dexamethasone;Moist Heat;Ultrasound;Dry needling;Manual techniques;Therapeutic activities;Therapeutic exercise   PT Next Visit Plan Assess response to tape. continue Rt shoulder ROM, scap control, abdct isometrics.    Consulted and Agree with Plan of Care Patient      Patient will benefit from skilled therapeutic intervention in order to improve the following  deficits and impairments:  Postural dysfunction, Improper body mechanics, Pain, Decreased range of motion, Decreased mobility, Decreased strength, Impaired UE functional use, Decreased activity tolerance  Visit  Diagnosis: Acute pain of right shoulder  Other symptoms and signs involving the musculoskeletal system  Weakness generalized     Problem List Patient Active Problem List   Diagnosis Date Noted  . Pulmonary nodules 12/26/2016  . Shoulder pain, right 12/25/2016  . Other chronic pain 11/03/2016  . Thyroid nodule 03/30/2016  . Solitary pulmonary nodule 03/30/2016  . Strain of gluteus medius 10/07/2015  . Anxiety and depression 09/29/2015  . Hip pain, acute 08/31/2015  . Vitamin D insufficiency 04/08/2015  . Elevated vitamin B12 level 04/08/2015  . Hirsutism 01/28/2015  . Right knee DJD 12/17/2014  . Osteoarthritis of right knee 11/04/2014  . Pain in right hip 08/21/2014  . Hair loss 08/21/2014  . Hypothyroidism 07/24/2014  . Vasomotor flushing 03/05/2014  . Intrinsic asthma 12/17/2013  . Spinal stenosis of lumbar region 10/14/2013  . Coffee ground emesis 03/06/2013  . Disorder of bursae and tendons in shoulder region 03/01/2013  . Anal fissure 02/22/2013  . Diverticulosis of colon without hemorrhage 02/22/2013  . Hyperglycemia 02/01/2013  . Essential hypertension, benign 01/31/2013  . CAP (community acquired pneumonia) 01/31/2013  . Osteoporosis 01/31/2013  . HSV infection 01/31/2013   Kerin Perna, PTA 04/05/17 10:35 AM  [place], [name], [address] Phone: [phone]   Fax:  [phone]  Name: Margaret [name] MRN: [id number] Date of Birth: 02-02-1946

## 2017-04-07 ENCOUNTER — Ambulatory Visit (INDEPENDENT_AMBULATORY_CARE_PROVIDER_SITE_OTHER): Payer: Medicare HMO | Admitting: Physical Therapy

## 2017-04-07 DIAGNOSIS — R531 Weakness: Secondary | ICD-10-CM | POA: Diagnosis not present

## 2017-04-07 DIAGNOSIS — M25511 Pain in right shoulder: Secondary | ICD-10-CM

## 2017-04-07 DIAGNOSIS — R29898 Other symptoms and signs involving the musculoskeletal system: Secondary | ICD-10-CM | POA: Diagnosis not present

## 2017-04-07 NOTE — Therapy (Signed)
[place], [name], [address] Phone: [phone]   Fax:  [phone]  Physical Therapy Treatment  Patient Details  Name: Margaret [name] MRN: [id number] Date of Birth: Jun 23, 1946 Referring Provider: Dr. Theone Stanley  Encounter Date: 04/07/2017      PT End of Session - 04/07/17 1533    Visit Number 15   Number of Visits 16   Date for PT Re-Evaluation 04/14/17   PT Start Time 0240   PT Stop Time 1531   PT Time Calculation (min) 40 min   Activity Tolerance Patient tolerated treatment well   Behavior During Therapy [facility] for tasks assessed/performed      Past Medical History:  Diagnosis Date  . Anemia   . Anxiety   . Arthritis   . Asthma   . Cancer ([place])   . COPD (chronic obstructive pulmonary disease) ([place])   . Emphysema of lung ([place])   . GERD (gastroesophageal reflux disease)   . Hypertension   . Osteoporosis   . Thyroid disease     Past Surgical History:  Procedure Laterality Date  . ABDOMINAL HYSTERECTOMY    . BREAST SURGERY    . CESAREAN SECTION    . JOINT REPLACEMENT     LT KNEE  . TOTAL KNEE ARTHROPLASTY  2006    There were no vitals filed for this visit.      Subjective Assessment - 04/07/17 1534    Subjective [name] reports the tape on shoulder has felt supportive.  She has tried doing shoulder exercises, but all of them make her shoulder sore.  She is getting pain relief with TENS unit.    Currently in Pain? Yes   Pain Score 6    Pain Location Shoulder   Pain Orientation Right   Pain Descriptors / Indicators Aching;Nagging;Dull   Aggravating Factors  shoulder flexion/abduct past 40 deg   Pain Relieving Factors rest, TENS, medication             [facility] PT Assessment - 04/07/17 0001      Assessment   Medical Diagnosis Rt shoulder pain    Referring Provider Dr. Theone Stanley   Onset Date/Surgical Date 11/07/16   Hand Dominance Right   Next MD Visit after  completion of 16 visits.      PROM   Right Shoulder Flexion 135 Degrees  AAROM with forward trunk flexion, arms relaxed on ball          [facility] Adult PT Treatment/Exercise - 04/07/17 0001      Neuro Re-ed    Neuro Re-ed Details  working on facilitation of posterior shoulder girdle - specifically middle and lower trap with various techniques/positions.      Shoulder Exercises: Supine   Other Supine Exercises hooklying on pool noodle:  scap retraction/depression with 10 sec hold x 10 reps      Shoulder Exercises: Isometric Strengthening   ABduction 5X10"  with elbow supported by therapist, 2 sets   ABduction Limitations Rt GH joint approximation prior to engagement of deltoid, tactile cues for post shoulder girdle engagement.      Shoulder Exercises: Stretch   Table Stretch - Flexion --  10 reps 10 sec hold    Table Stretch -Flexion Limitations  arms on therapy ball, rolling ball forward.      Modalities   Modalities --  pt declined     Manual Therapy   Manual Therapy Taping   Manual therapy comments Rock tape I  strips applied in vertical position over ant and post deltoid, pulled with 75% upward to help support GH joint.      Joint Mobilization GH circumduction    Myofascial Release Lt pec release.    Passive ROM Lt shoulder flexion, ext, scaption - to tolerance.                   PT Short Term Goals - 02/10/17 1009      PT SHORT TERM GOAL #1   Title Patient independent in initial HEP 01/04/17   Baseline 02/10/17: met per verbal discussion with pt (able to recall all exercises)   Status Achieved     PT SHORT TERM GOAL #2   Title Begin strengthening exercise per protocol 01/04/17   Status Achieved     PT SHORT TERM GOAL #3   Title Patient to use Rt UE for dressing and bathing as well as eating and light kitchen activities that do not require lifting 12/18/16   Status Achieved           PT Long Term Goals - 04/07/17 1540      PT LONG TERM GOAL #1    Title Improve posture and alignment with patient to demonstrate improved posterior shoulder girdle facilitation for improved posture and alignment 04/14/17   Time 4   Period Weeks   Status On-going  improving     PT LONG TERM GOAL #2   Title AROM Rt shoulder equal or greater than AROM Lt shoulder 04/14/17   Time 4   Period Weeks   Status On-going     PT LONG TERM GOAL #3   Title 4+/5 to 5/5 strength Rt UE 04/14/17   Time 4   Period Weeks   Status On-going     PT LONG TERM GOAL #4   Title Independent in advanced HEP for discharge 04/14/17   Time 4   Period Weeks   Status On-going     PT LONG TERM GOAL #5   Title Improve FOTO to </= 42 % limitation 04/14/17   Time 4   Period Weeks               Plan - 04/07/17 1541    Rehab Potential Fair   PT Frequency 2x / week   PT Duration 4 weeks   PT Treatment/Interventions Neuromuscular re-education;ADLs/Self Care Home Management;Electrical Stimulation;Cryotherapy;Iontophoresis 78m/ml Dexamethasone;Moist Heat;Ultrasound;Dry needling;Manual techniques;Therapeutic activities;Therapeutic exercise   PT Next Visit Plan continue Rt shoulder ROM, scap control, abdct isometrics, Rock tape for shoulder support.  FJerolyn Center MD note.    Consulted and Agree with Plan of Care Patient      Patient will benefit from skilled therapeutic intervention in order to improve the following deficits and impairments:  Postural dysfunction, Improper body mechanics, Pain, Decreased range of motion, Decreased mobility, Decreased strength, Impaired UE functional use, Decreased activity tolerance  Visit Diagnosis: Acute pain of right shoulder  Other symptoms and signs involving the musculoskeletal system  Weakness generalized     Problem List Patient Active Problem List   Diagnosis Date Noted  . Pulmonary nodules 12/26/2016  . Shoulder pain, right 12/25/2016  . Other chronic pain 11/03/2016  . Thyroid nodule 03/30/2016  . Solitary pulmonary nodule  03/30/2016  . Strain of gluteus medius 10/07/2015  . Anxiety and depression 09/29/2015  . Hip pain, acute 08/31/2015  . Vitamin D insufficiency 04/08/2015  . Elevated vitamin B12 level 04/08/2015  . Hirsutism 01/28/2015  . Right knee DJD 12/17/2014  .  Osteoarthritis of right knee 11/04/2014  . Pain in right hip 08/21/2014  . Hair loss 08/21/2014  . Hypothyroidism 07/24/2014  . Vasomotor flushing 03/05/2014  . Intrinsic asthma 12/17/2013  . Spinal stenosis of lumbar region 10/14/2013  . Coffee ground emesis 03/06/2013  . Disorder of bursae and tendons in shoulder region 03/01/2013  . Anal fissure 02/22/2013  . Diverticulosis of colon without hemorrhage 02/22/2013  . Hyperglycemia 02/01/2013  . Essential hypertension, benign 01/31/2013  . CAP (community acquired pneumonia) 01/31/2013  . Osteoporosis 01/31/2013  . HSV infection 01/31/2013   Kerin Perna, PTA 04/07/17 3:53 PM  [place], [name], [address] Phone: [phone]   Fax:  [phone]  Name: Adamary Savary MRN: [id number] Date of Birth: 04/10/1946

## 2017-04-10 ENCOUNTER — Ambulatory Visit (INDEPENDENT_AMBULATORY_CARE_PROVIDER_SITE_OTHER): Payer: Medicare HMO | Admitting: Physical Therapy

## 2017-04-10 DIAGNOSIS — M25511 Pain in right shoulder: Secondary | ICD-10-CM

## 2017-04-10 DIAGNOSIS — R29898 Other symptoms and signs involving the musculoskeletal system: Secondary | ICD-10-CM

## 2017-04-10 DIAGNOSIS — R531 Weakness: Secondary | ICD-10-CM

## 2017-04-10 NOTE — Therapy (Addendum)
[place], [name], [address] Phone: [phone]   Fax:  [phone]  Physical Therapy Treatment  Patient Details  Name: Margaret [name] MRN: [id number] Date of Birth: Apr 16, 1946 Referring Provider: Dr. Theone Stanley  Encounter Date: 04/10/2017      PT End of Session - 04/10/17 0851    Visit Number 16   Number of Visits 16   Date for PT Re-Evaluation 04/14/17   PT Start Time 0848   PT Stop Time 0922   PT Time Calculation (min) 34 min   Activity Tolerance Patient limited by pain   Behavior During Therapy [facility] for tasks assessed/performed      Past Medical History:  Diagnosis Date  . Anemia   . Anxiety   . Arthritis   . Asthma   . Cancer ([place])   . COPD (chronic obstructive pulmonary disease) ([place])   . Emphysema of lung ([place])   . GERD (gastroesophageal reflux disease)   . Hypertension   . Osteoporosis   . Thyroid disease     Past Surgical History:  Procedure Laterality Date  . ABDOMINAL HYSTERECTOMY    . BREAST SURGERY    . CESAREAN SECTION    . JOINT REPLACEMENT     LT KNEE  . TOTAL KNEE ARTHROPLASTY  2006    There were no vitals filed for this visit.      Subjective Assessment - 04/10/17 0851    Subjective Margaret [name] reports the Rock tape on Rt shoulder has felt supportive and has given a little bit of pain relief. Her shoulder feels tight today.    Currently in Pain? Yes   Pain Score 4    Pain Location Shoulder   Pain Orientation Right   Pain Descriptors / Indicators Sore   Aggravating Factors  shoulder flexion/abduction past ~45 deg   Pain Relieving Factors rest, TENS, medication             [facility] PT Assessment - 04/10/17 0001      Observation/Other Assessments   Focus on Therapeutic Outcomes (FOTO)  44 (56% limited.)      AROM   Right/Left Shoulder Right   Right Shoulder Extension 43 Degrees   Right Shoulder Flexion 85 Degrees  with pain   Right Shoulder ABduction  70 Degrees  standing, painful   Right Shoulder Internal Rotation 50 Degrees  supine   Right Shoulder External Rotation 70 Degrees  supine, abdct to ~60 deg.     PROM   Right Shoulder Flexion 138 Degrees  AAROM with forward trunk flexion, arms relaxed on ball     Strength   Overall Strength Comments in prone pt unable to perform external rotation; min active resistance in seated with external rotation; in sitting shoulder flexion to ~ 80 degrees with substitution patterns noted         [facility] Adult PT Treatment/Exercise - 04/10/17 0001      Shoulder Exercises: Seated   Other Seated Exercises scapular depression hand on ball pressing in to the ball x 10 sec hold x 10 reps; small movements fwd/back; side/side; circles CW/CCW x 10 each; scap drepression resisting perterbations into ball x 15 sec x 4 reps    Other Seated Exercises shoulder rolls x 10.  Rt shoulder flexion AAROM sliding arm up inclined high/low table x 10 reps x 2 sets with tactile cues for scap retraction/depression.      Shoulder Exercises: Prone   Other Prone Exercises prone Rt row  to neutral x 10 (tactile cues for scap retraction)     Shoulder Exercises: Isometric Strengthening   ABduction 5X10"  with elbow supported by therapist, 2 sets   ABduction Limitations Rt GH joint approximation prior to engagement of deltoid, tactile cues for post shoulder girdle engagement.      Shoulder Exercises: Stretch   Table Stretch - Flexion --  12 reps 10 sec hold    Table Stretch -Flexion Limitations  arms relaxed on therapy ball, rolling ball forward.      Manual Therapy   Manual Therapy Taping   Manual therapy comments Rock tape I strips applied in vertical position over ant and post deltoid, pulled with 75% upward to help support GH joint.                     PT Short Term Goals - 02/10/17 1009      PT SHORT TERM GOAL #1   Title Patient independent in initial HEP 01/04/17   Baseline 02/10/17: met per verbal  discussion with pt (able to recall all exercises)   Status Achieved     PT SHORT TERM GOAL #2   Title Begin strengthening exercise per protocol 01/04/17   Status Achieved     PT SHORT TERM GOAL #3   Title Patient to use Rt UE for dressing and bathing as well as eating and light kitchen activities that do not require lifting 12/18/16   Status Achieved           PT Long Term Goals - 04/10/17 1223      PT LONG TERM GOAL #1   Title Improve posture and alignment with patient to demonstrate improved posterior shoulder girdle facilitation for improved posture and alignment 04/14/17   Time 4   Period Weeks   Status On-going     PT LONG TERM GOAL #2   Title AROM Rt shoulder equal or greater than AROM Lt shoulder 04/14/17   Time 4   Period Weeks   Status Not Met     PT LONG TERM GOAL #3   Title 4+/5 to 5/5 strength Rt UE 04/14/17   Time 4   Period Weeks   Status Not Met     PT LONG TERM GOAL #4   Title Independent in advanced HEP for discharge 04/14/17   Time 4   Period Weeks   Status On-going     PT LONG TERM GOAL #5   Title Improve FOTO to </= 42 % limitation 04/14/17   Time 4   Period Weeks   Status Achieved               Plan - 04/10/17 1219    Clinical Impression Statement Continued limited Rt shoulder ROM and decreased strength likely due to re-injury. Awaiting further diagnostic testing. Focus has been on retaining PROM and scapular stability. Pt has visible sulcus in Rt shoulder in sitting; Rock tape has been applied for gentle support of GH joint.   FOTO goal met. Very limited progress made towards ROM and strength.    Rehab Potential Fair   PT Frequency 2x / week   PT Duration 4 weeks   PT Treatment/Interventions Neuromuscular re-education;ADLs/Self Care Home Management;Electrical Stimulation;Cryotherapy;Iontophoresis 51m/ml Dexamethasone;Moist Heat;Ultrasound;Dry needling;Manual techniques;Therapeutic activities;Therapeutic exercise   PT Next Visit Plan Pt  returns to MD. Will hold and await further instruction.     Consulted and Agree with Plan of Care Patient      Patient will benefit from  skilled therapeutic intervention in order to improve the following deficits and impairments:  Postural dysfunction, Improper body mechanics, Pain, Decreased range of motion, Decreased mobility, Decreased strength, Impaired UE functional use, Decreased activity tolerance  Visit Diagnosis: Acute pain of right shoulder  Other symptoms and signs involving the musculoskeletal system  Weakness generalized     Problem List Patient Active Problem List   Diagnosis Date Noted  . Pulmonary nodules 12/26/2016  . Shoulder pain, right 12/25/2016  . Other chronic pain 11/03/2016  . Thyroid nodule 03/30/2016  . Solitary pulmonary nodule 03/30/2016  . Strain of gluteus medius 10/07/2015  . Anxiety and depression 09/29/2015  . Hip pain, acute 08/31/2015  . Vitamin D insufficiency 04/08/2015  . Elevated vitamin B12 level 04/08/2015  . Hirsutism 01/28/2015  . Right knee DJD 12/17/2014  . Osteoarthritis of right knee 11/04/2014  . Pain in right hip 08/21/2014  . Hair loss 08/21/2014  . Hypothyroidism 07/24/2014  . Vasomotor flushing 03/05/2014  . Intrinsic asthma 12/17/2013  . Spinal stenosis of lumbar region 10/14/2013  . Coffee ground emesis 03/06/2013  . Disorder of bursae and tendons in shoulder region 03/01/2013  . Anal fissure 02/22/2013  . Diverticulosis of colon without hemorrhage 02/22/2013  . Hyperglycemia 02/01/2013  . Essential hypertension, benign 01/31/2013  . CAP (community acquired pneumonia) 01/31/2013  . Osteoporosis 01/31/2013  . HSV infection 01/31/2013   Kerin Perna, PTA 04/10/17 12:27 PM  [place], [name], [address] Phone: [phone]   Fax:  [phone]  Name: Margaret [name] MRN: [id number] Date of Birth: Jun 10, 1946  PHYSICAL  THERAPY DISCHARGE SUMMARY  Visits from Start of Care: 16  Current functional level related to goals / functional outcomes: See progress note for discharge status   Remaining deficits: Continued weakness and pain in shoulder    Education / Equipment: HEP  Plan: Patient agrees to discharge.  Patient goals were not met. Patient is being discharged due to not returning since the last visit.  ?????     Celyn P. Helene Kelp PT, MPH 05/11/17 5:20 PM

## 2017-04-11 DIAGNOSIS — M75101 Unspecified rotator cuff tear or rupture of right shoulder, not specified as traumatic: Secondary | ICD-10-CM | POA: Diagnosis not present

## 2017-04-14 ENCOUNTER — Other Ambulatory Visit: Payer: Self-pay

## 2017-04-14 ENCOUNTER — Encounter: Payer: Self-pay | Admitting: Osteopathic Medicine

## 2017-04-14 MED ORDER — FLUOXETINE HCL 40 MG PO CAPS
40.0000 mg | ORAL_CAPSULE | Freq: Every day | ORAL | 0 refills | Status: DC
Start: 2017-04-14 — End: 2017-05-18

## 2017-04-14 NOTE — Telephone Encounter (Signed)
PATIENT REQUEST REFILL FOR PROZAC. #[phone] REFILLS SENT TO PHARMACY. PATIENT ADVISED THAT APPOINTMENT IS NEEDED FOR FURTHER REFILLS. Rhonda Cunningham,CMA

## 2017-04-28 DIAGNOSIS — M25552 Pain in left hip: Secondary | ICD-10-CM | POA: Diagnosis not present

## 2017-04-28 DIAGNOSIS — G894 Chronic pain syndrome: Secondary | ICD-10-CM | POA: Diagnosis not present

## 2017-04-28 DIAGNOSIS — M25551 Pain in right hip: Secondary | ICD-10-CM | POA: Diagnosis not present

## 2017-04-28 DIAGNOSIS — M545 Low back pain: Secondary | ICD-10-CM | POA: Diagnosis not present

## 2017-04-28 DIAGNOSIS — I10 Essential (primary) hypertension: Secondary | ICD-10-CM | POA: Diagnosis not present

## 2017-05-02 DIAGNOSIS — K920 Hematemesis: Secondary | ICD-10-CM | POA: Diagnosis not present

## 2017-05-14 ENCOUNTER — Other Ambulatory Visit: Payer: Self-pay | Admitting: Osteopathic Medicine

## 2017-05-18 ENCOUNTER — Ambulatory Visit (INDEPENDENT_AMBULATORY_CARE_PROVIDER_SITE_OTHER): Payer: Medicare HMO | Admitting: Osteopathic Medicine

## 2017-05-18 ENCOUNTER — Encounter: Payer: Self-pay | Admitting: Osteopathic Medicine

## 2017-05-18 VITALS — BP 105/70 | HR 100 | Ht 60.0 in | Wt 132.0 lb

## 2017-05-18 DIAGNOSIS — I10 Essential (primary) hypertension: Secondary | ICD-10-CM | POA: Diagnosis not present

## 2017-05-18 DIAGNOSIS — R69 Illness, unspecified: Secondary | ICD-10-CM | POA: Diagnosis not present

## 2017-05-18 DIAGNOSIS — R635 Abnormal weight gain: Secondary | ICD-10-CM | POA: Diagnosis not present

## 2017-05-18 DIAGNOSIS — L659 Nonscarring hair loss, unspecified: Secondary | ICD-10-CM

## 2017-05-18 DIAGNOSIS — E039 Hypothyroidism, unspecified: Secondary | ICD-10-CM | POA: Diagnosis not present

## 2017-05-18 DIAGNOSIS — D509 Iron deficiency anemia, unspecified: Secondary | ICD-10-CM

## 2017-05-18 DIAGNOSIS — F418 Other specified anxiety disorders: Secondary | ICD-10-CM

## 2017-05-18 MED ORDER — FLUOXETINE HCL 40 MG PO CAPS: 40.0000 mg | ORAL_CAPSULE | Freq: Every day | ORAL | 3 refills | Status: DC

## 2017-05-18 NOTE — Patient Instructions (Addendum)
Plan:  Check labs at your convenience  Refilled medications  Stop Amlodipine 10 mg  Continue Losartan 100 mg  Check BP at home: if stays under 130 / 80, but above 100 or 110 / 60 - 70, all good!   If too high or too low, come see me!

## 2017-05-18 NOTE — Progress Notes (Signed)
HPI: Margaret [name] is a 71 y.o. female  who presents to [place] today, 05/18/17,  for chief complaint of:  Chief Complaint  Patient presents with  . Follow-up    ANXIETY MEDICATION    Here for refill on fluoxetine, doing well on this medication, no complaints. Was told by her pharmacy that she needed a visit before any refills will be authorized. I was initially just going to refill the medications and not charge the patient for the visit but she had a few other issues she wanted to bring up while she was here.  She has been noticing her hair is falling out a bit more than usual, has gained a few pounds unintentionally. Would like thyroid labs checked  Blood pressure a bit on the low side, no chest pain, pressure, dizziness. Compliant with medications as noted below.   Past medical history, surgical history, social history and family history reviewed.  Patient Active Problem List   Diagnosis Date Noted  . Pulmonary nodules 12/26/2016  . Shoulder pain, right 12/25/2016  . Other chronic pain 11/03/2016  . Thyroid nodule 03/30/2016  . Solitary pulmonary nodule 03/30/2016  . Strain of gluteus medius 10/07/2015  . Anxiety and depression 09/29/2015  . Hip pain, acute 08/31/2015  . Vitamin D insufficiency 04/08/2015  . Elevated vitamin B12 level 04/08/2015  . Hirsutism 01/28/2015  . Right knee DJD 12/17/2014  . Osteoarthritis of right knee 11/04/2014  . Pain in right hip 08/21/2014  . Hair loss 08/21/2014  . Hypothyroidism 07/24/2014  . Vasomotor flushing 03/05/2014  . Intrinsic asthma 12/17/2013  . Spinal stenosis of lumbar region 10/14/2013  . Coffee ground emesis 03/06/2013  . Disorder of bursae and tendons in shoulder region 03/01/2013  . Anal fissure 02/22/2013  . Diverticulosis of colon without hemorrhage 02/22/2013  . Hyperglycemia 02/01/2013  . Essential hypertension, benign 01/31/2013  . CAP (community acquired pneumonia)  01/31/2013  . Osteoporosis 01/31/2013  . HSV infection 01/31/2013    Current medication list and allergy/intolerance information reviewed.   Current Outpatient Prescriptions on File Prior to Visit  Medication Sig Dispense Refill  . albuterol (PROVENTIL HFA;VENTOLIN HFA) 108 (90 Base) MCG/ACT inhaler Inhale 1-2 puffs into the lungs every 4 (four) hours as needed for wheezing. 1 Inhaler 0  . amLODipine (NORVASC) 10 MG tablet Take 1 tablet (10 mg total) by mouth daily. 90 tablet 3  . levothyroxine (SYNTHROID, LEVOTHROID) 50 MCG tablet Take 1 tablet (50 mcg total) by mouth daily before breakfast. 90 tablet 1  . losartan (COZAAR) 100 MG tablet TAKE 1 TABLET BY MOUTH EVERY DAY 90 tablet 0  . meloxicam (MOBIC) 15 MG tablet Take 15 mg by mouth daily.    . montelukast (SINGULAIR) 10 MG tablet Take 10 mg by mouth daily.  2  . morphine (MSIR) 15 MG tablet Take 15 mg by mouth every 4 (four) hours as needed for severe pain.     No current facility-administered medications on file prior to visit.    Allergies  Allergen Reactions  . Tetracyclines & Related     Other reaction(s): Other SERVER INDIGESTION  . Evista [Raloxifene]     Back pain  . Floxin [Ofloxacin] Hives  . Neosporin [Neomycin-Polymyxin-Gramicidin] Hives  . Other     ANTIBIOTIC OINT - UNSURE OF NAME       Review of Systems:  Constitutional: No recent illness  HEENT: No  headache, no vision change  Cardiac: No  chest pain, No  pressure, No palpitations  Respiratory:  No  shortness of breath. No  Cough  Gastrointestinal: No  abdominal pain, no change on bowel habits  Skin: No  Rash  Neurologic: No  weakness, No  Dizziness  Psychiatric: No  concerns with depression, No  concerns with anxiety  Exam:  BP 105/70   Pulse 100   Ht 5' (1.524 m)   Wt 132 lb (59.9 kg)   BMI 25.78 kg/m   Constitutional: VS see above. General Appearance: alert, well-developed, well-nourished, NAD  Eyes: Normal lids and conjunctive,  non-icteric sclera  Ears, Nose, Mouth, Throat: MMM, Normal external inspection ears/nares/mouth/lips/gums.  Neck: No masses, trachea midline.   Respiratory: Normal respiratory effort. no wheeze, no rhonchi, no rales  Cardiovascular: S1/S2 normal, no murmur, no rub/gallop auscultated. RRR.   Musculoskeletal: Gait normal. Symmetric and independent movement of all extremities  Neurological: Normal balance/coordination. No tremor.  Skin: warm, dry, intact.   Psychiatric: Normal judgment/insight. Normal mood and affect. Oriented x3.     Depression screen [facility] 2/9 05/18/2017 02/08/2017 02/06/2017  Decreased Interest 0 0 0  Down, Depressed, Hopeless 0 0 0  PHQ - 2 Score 0 0 0  Altered sleeping 1 3 -  Tired, decreased energy 1 3 -  Change in appetite 0 2 -  Feeling bad or failure about yourself  0 0 -  Trouble concentrating 0 0 -  Moving slowly or fidgety/restless 0 0 -  Suicidal thoughts 0 0 -  PHQ-9 Score 2 8 -      ASSESSMENT/PLAN:   Depression with anxiety - Stable, continue current medications  Hair loss - Taking biotin, advised patient this doesn't have much in the way of clinical benefit for hair loss but shouldn't be harmful, can interfere with certain lab resu - Plan: TSH, Ferritin  Weight gain - Plan: CBC, COMPLETE METABOLIC PANEL WITH GFR, TSH, Ferritin  Essential hypertension, benign - Overmedicated based on low blood pressures, see patient instructions, discontinue amlodipine, continue losartan, patient has verified home blood pressure cuff  Hypothyroidism, unspecified type - Patient would like to recheck TSH due to hair loss concerns    Patient Instructions  Plan:  Check labs at your convenience  Refilled medications  Stop Amlodipine 10 mg  Continue Losartan 100 mg  Check BP at home: if stays under 130 / 80, but above 100 or 110 / 60 - 70, all good!   If too high or too low, come see me!     Follow-up plan: Return in about 6 months (around 11/17/2017)  for routine checkup - recheck blood pressure .  Visit summary with medication list and pertinent instructions was printed for patient to review, alert [place] if any changes needed. All questions at time of visit were answered - patient instructed to contact office with any additional concerns. ER/RTC precautions were reviewed with the patient and understanding verbalized.

## 2017-05-20 DIAGNOSIS — M75121 Complete rotator cuff tear or rupture of right shoulder, not specified as traumatic: Secondary | ICD-10-CM | POA: Diagnosis not present

## 2017-05-23 DIAGNOSIS — M19011 Primary osteoarthritis, right shoulder: Secondary | ICD-10-CM | POA: Diagnosis not present

## 2017-05-26 DIAGNOSIS — R635 Abnormal weight gain: Secondary | ICD-10-CM | POA: Diagnosis not present

## 2017-05-26 DIAGNOSIS — L659 Nonscarring hair loss, unspecified: Secondary | ICD-10-CM | POA: Diagnosis not present

## 2017-05-27 LAB — COMPLETE METABOLIC PANEL WITH GFR
ALT: 14 U/L (ref 6–29)
AST: 19 U/L (ref 10–35)
Albumin: 4.2 g/dL (ref 3.6–5.1)
Alkaline Phosphatase: 65 U/L (ref 33–130)
BILIRUBIN TOTAL: 0.5 mg/dL (ref 0.2–1.2)
BUN: 14 mg/dL (ref 7–25)
CALCIUM: 9.3 mg/dL (ref 8.6–10.4)
CHLORIDE: 104 mmol/L (ref 98–110)
CO2: 21 mmol/L (ref 20–31)
Creat: 0.9 mg/dL (ref 0.60–0.93)
GFR, EST AFRICAN AMERICAN: 75 mL/min (ref >=60)
GFR, EST NON AFRICAN AMERICAN: 65 mL/min (ref >=60)
Glucose, Bld: 102 mg/dL — ABNORMAL HIGH (ref 65–99)
Potassium: 4 mmol/L (ref 3.5–5.3)
Sodium: 136 mmol/L (ref 135–146)
TOTAL PROTEIN: 6.3 g/dL (ref 6.1–8.1)

## 2017-05-27 LAB — CBC
HEMATOCRIT: 32.1 % — AB (ref 35.0–45.0)
Hemoglobin: 10.5 g/dL — ABNORMAL LOW (ref 11.7–15.5)
MCH: 30.1 pg (ref 27.0–33.0)
MCHC: 32.7 g/dL (ref 32.0–36.0)
MCV: 92 fL (ref 80.0–100.0)
MPV: 9.6 fL (ref 7.5–12.5)
PLATELETS: 327 10*3/uL (ref 140–400)
RBC: 3.49 MIL/uL — ABNORMAL LOW (ref 3.80–5.10)
RDW: 15 % (ref 11.0–15.0)
WBC: 8.4 10*3/uL (ref 3.8–10.8)

## 2017-05-27 LAB — FERRITIN: Ferritin: 13 ng/mL — ABNORMAL LOW (ref 20–288)

## 2017-05-27 LAB — TSH: TSH: 1.53 mIU/L

## 2017-05-30 ENCOUNTER — Other Ambulatory Visit: Payer: Self-pay | Admitting: Osteopathic Medicine

## 2017-05-31 DIAGNOSIS — D509 Iron deficiency anemia, unspecified: Secondary | ICD-10-CM | POA: Insufficient documentation

## 2017-05-31 MED ORDER — FERROUS SULFATE 325 (65 FE) MG PO TBEC: 325.0000 mg | DELAYED_RELEASE_TABLET | Freq: Two times a day (BID) | ORAL | 1 refills | Status: DC

## 2017-05-31 NOTE — Addendum Note (Signed)
Addended by: Maryla Morrow on: 05/31/2017 07:30 AM   Modules accepted: Orders

## 2017-06-01 DIAGNOSIS — R0602 Shortness of breath: Secondary | ICD-10-CM | POA: Diagnosis not present

## 2017-06-01 DIAGNOSIS — J441 Chronic obstructive pulmonary disease with (acute) exacerbation: Secondary | ICD-10-CM | POA: Diagnosis not present

## 2017-06-01 DIAGNOSIS — I10 Essential (primary) hypertension: Secondary | ICD-10-CM | POA: Diagnosis not present

## 2017-06-02 NOTE — Progress Notes (Signed)
Error, medication already there

## 2017-06-19 ENCOUNTER — Ambulatory Visit (INDEPENDENT_AMBULATORY_CARE_PROVIDER_SITE_OTHER): Payer: Medicare HMO | Admitting: Osteopathic Medicine

## 2017-06-19 ENCOUNTER — Other Ambulatory Visit: Payer: Self-pay | Admitting: Osteopathic Medicine

## 2017-06-19 VITALS — BP 143/89 | HR 76 | Wt 131.0 lb

## 2017-06-19 DIAGNOSIS — I10 Essential (primary) hypertension: Secondary | ICD-10-CM

## 2017-06-19 DIAGNOSIS — R21 Rash and other nonspecific skin eruption: Secondary | ICD-10-CM

## 2017-06-19 MED ORDER — BETAMETHASONE DIPROPIONATE 0.05 % EX CREA: TOPICAL_CREAM | Freq: Two times a day (BID) | CUTANEOUS | 0 refills | Status: DC

## 2017-06-19 NOTE — Patient Instructions (Signed)
Plan:  I think there is some kind of allergy/irritation going on   Topical steroids (prescription sent) and oral antihistamines (Claritin or Allegra or Zyrtec)  If no better or if worse, will get a biopsy for confirmation of diagnosis

## 2017-06-19 NOTE — Progress Notes (Signed)
HPI: Margaret [name] is a 71 y.o. female  who presents to [place] today, 06/19/17,  for chief complaint of:  Chief Complaint  Patient presents with  . Edema  . Pruritis     . Context: no known new exposures, recent labs normal . Location: arms initially then spread to neck/chest  . Quality: itching, swelling . Duration: 2 weeks . Modifying factors: benadryl cream has helped tching but hasn't stopped spread   Past medical history, surgical history, social history and family history reviewed.  Patient Active Problem List   Diagnosis Date Noted  . Iron deficiency anemia 05/31/2017  . Pulmonary nodules 12/26/2016  . Shoulder pain, right 12/25/2016  . Other chronic pain 11/03/2016  . Thyroid nodule 03/30/2016  . Solitary pulmonary nodule 03/30/2016  . Strain of gluteus medius 10/07/2015  . Depression with anxiety 09/29/2015  . Hip pain, acute 08/31/2015  . Vitamin D insufficiency 04/08/2015  . Elevated vitamin B12 level 04/08/2015  . Hirsutism 01/28/2015  . Right knee DJD 12/17/2014  . Osteoarthritis of right knee 11/04/2014  . Pain in right hip 08/21/2014  . Hair loss 08/21/2014  . Hypothyroidism 07/24/2014  . Vasomotor flushing 03/05/2014  . Intrinsic asthma 12/17/2013  . Spinal stenosis of lumbar region 10/14/2013  . Coffee ground emesis 03/06/2013  . Disorder of bursae and tendons in shoulder region 03/01/2013  . Anal fissure 02/22/2013  . Diverticulosis of colon without hemorrhage 02/22/2013  . Hyperglycemia 02/01/2013  . Essential hypertension, benign 01/31/2013  . CAP (community acquired pneumonia) 01/31/2013  . Osteoporosis 01/31/2013  . HSV infection 01/31/2013    Current medication list and allergy/intolerance information reviewed.   Current Outpatient Prescriptions on File Prior to Visit  Medication Sig Dispense Refill  . albuterol (PROVENTIL HFA;VENTOLIN HFA) 108 (90 Base) MCG/ACT inhaler Inhale 1-2 puffs into the  lungs every 4 (four) hours as needed for wheezing. 1 Inhaler 0  . amLODipine (NORVASC) 10 MG tablet Take 1 tablet (10 mg total) by mouth daily. 90 tablet 3  . ferrous sulfate 325 (65 FE) MG EC tablet Take 1 tablet (325 mg total) by mouth 2 (two) times daily with a meal. 180 tablet 1  . FLUoxetine (PROZAC) 40 MG capsule Take 1 capsule (40 mg total) by mouth daily. 90 capsule 3  . levothyroxine (SYNTHROID, LEVOTHROID) 50 MCG tablet Take 1 tablet (50 mcg total) by mouth daily before breakfast. 90 tablet 1  . losartan (COZAAR) 100 MG tablet TAKE 1 TABLET BY MOUTH EVERY DAY 90 tablet 0  . meloxicam (MOBIC) 15 MG tablet Take 15 mg by mouth daily.    . montelukast (SINGULAIR) 10 MG tablet Take 10 mg by mouth daily.  2  . morphine (MSIR) 15 MG tablet Take 15 mg by mouth every 4 (four) hours as needed for severe pain.     No current facility-administered medications on file prior to visit.    Allergies  Allergen Reactions  . Tetracyclines & Related     Other reaction(s): Other SERVER INDIGESTION  . Evista [Raloxifene]     Back pain  . Floxin [Ofloxacin] Hives  . Neosporin [Neomycin-Polymyxin-Gramicidin] Hives  . Other     ANTIBIOTIC OINT - UNSURE OF NAME       Review of Systems:  Constitutional: No recent illness, no fever/chills  HEENT: No  headache, no vision change  Cardiac: No  chest pain, No  pressure, No palpitations  Respiratory:  No  shortness of breath. No  Cough  Gastrointestinal: No  abdominal pain, no change on bowel habits  Musculoskeletal: No new myalgia/arthralgia  Skin: +Rash  Neurologic: No  weakness, No  Dizziness   Exam:  BP (!) 143/89   Pulse 76   Wt 131 lb (59.4 kg)   SpO2 98%   BMI 25.58 kg/m   Constitutional: VS see above. General Appearance: alert, well-developed, well-nourished, NAD  Eyes: Normal lids and conjunctive, non-icteric sclera  Ears, Nose, Mouth, Throat: MMM, Normal external inspection ears/nares/mouth/lips/gums.  Neck: No masses,  trachea midline.   Respiratory: Normal respiratory effort. no wheeze, no rhonchi, no rales  Cardiovascular: S1/S2 normal, no murmur, no rub/gallop auscultated. RRR.   Musculoskeletal: Gait normal. Symmetric and independent movement of all extremities  Neurological: Normal balance/coordination. No tremor.  Skin: warm, dry, intact. Mild excoriation on L forearm, localized skin edema w/o erythema or tenderness, scattered maculopapular rash forearms and chest    Psychiatric: Normal judgment/insight. Normal mood and affect. Oriented x3.        ASSESSMENT/PLAN: The encounter diagnosis was Rash and nonspecific skin eruption.  Patient Instructions  Plan:  I think there is some kind of allergy/irritation going on   Topical steroids (prescription sent) and oral antihistamines (Claritin or Allegra or Zyrtec)  If no better or if worse, will get a biopsy for confirmation of diagnosis    Follow-up plan: Return for recheck/biopsy rash in 2-3 days .  Visit summary with medication list and pertinent instructions was printed for patient to review, alert [place] if any changes needed. All questions at time of visit were answered - patient instructed to contact office with any additional concerns. ER/RTC precautions were reviewed with the patient and understanding verbalized.

## 2017-06-21 DIAGNOSIS — I10 Essential (primary) hypertension: Secondary | ICD-10-CM | POA: Diagnosis not present

## 2017-06-21 DIAGNOSIS — R05 Cough: Secondary | ICD-10-CM | POA: Diagnosis not present

## 2017-06-21 DIAGNOSIS — J449 Chronic obstructive pulmonary disease, unspecified: Secondary | ICD-10-CM | POA: Diagnosis not present

## 2017-06-22 ENCOUNTER — Ambulatory Visit: Payer: Medicare HMO | Admitting: Osteopathic Medicine

## 2017-06-28 DIAGNOSIS — M549 Dorsalgia, unspecified: Secondary | ICD-10-CM | POA: Diagnosis not present

## 2017-06-28 DIAGNOSIS — J449 Chronic obstructive pulmonary disease, unspecified: Secondary | ICD-10-CM | POA: Diagnosis not present

## 2017-06-28 DIAGNOSIS — I10 Essential (primary) hypertension: Secondary | ICD-10-CM | POA: Diagnosis not present

## 2017-06-28 DIAGNOSIS — M19011 Primary osteoarthritis, right shoulder: Secondary | ICD-10-CM | POA: Diagnosis not present

## 2017-06-28 DIAGNOSIS — Z79899 Other long term (current) drug therapy: Secondary | ICD-10-CM | POA: Diagnosis not present

## 2017-06-28 DIAGNOSIS — Z01818 Encounter for other preprocedural examination: Secondary | ICD-10-CM | POA: Diagnosis not present

## 2017-06-28 DIAGNOSIS — E039 Hypothyroidism, unspecified: Secondary | ICD-10-CM | POA: Diagnosis not present

## 2017-06-28 DIAGNOSIS — G8929 Other chronic pain: Secondary | ICD-10-CM | POA: Diagnosis not present

## 2017-06-28 DIAGNOSIS — R21 Rash and other nonspecific skin eruption: Secondary | ICD-10-CM | POA: Diagnosis not present

## 2017-06-28 DIAGNOSIS — E785 Hyperlipidemia, unspecified: Secondary | ICD-10-CM | POA: Diagnosis not present

## 2017-07-07 DIAGNOSIS — Z539 Procedure and treatment not carried out, unspecified reason: Secondary | ICD-10-CM | POA: Diagnosis not present

## 2017-07-07 DIAGNOSIS — M19011 Primary osteoarthritis, right shoulder: Secondary | ICD-10-CM | POA: Insufficient documentation

## 2017-07-08 ENCOUNTER — Encounter: Payer: Self-pay | Admitting: Emergency Medicine

## 2017-07-08 ENCOUNTER — Emergency Department
Admission: EM | Admit: 2017-07-08 | Discharge: 2017-07-08 | Disposition: A | Discharge status: Home / Self Care | Payer: Medicare HMO | Source: Home / Self Care | Attending: Family Medicine | Admitting: Family Medicine

## 2017-07-08 DIAGNOSIS — J9801 Acute bronchospasm: Secondary | ICD-10-CM

## 2017-07-08 MED ORDER — IPRATROPIUM-ALBUTEROL 0.5-2.5 (3) MG/3ML IN SOLN: 3.0000 mL | Freq: Four times a day (QID) | RESPIRATORY_TRACT | Status: DC

## 2017-07-08 MED ORDER — IPRATROPIUM-ALBUTEROL 0.5-2.5 (3) MG/3ML IN SOLN
3.0000 mL | Freq: Once | RESPIRATORY_TRACT | Status: AC
  Administered 2017-07-08: 3 mL via RESPIRATORY_TRACT

## 2017-07-08 NOTE — Discharge Instructions (Signed)
Continue albuterol inhaler as prescribed.

## 2017-07-08 NOTE — ED Provider Notes (Signed)
[name]    CSN: [id number] Arrival date & time: 07/08/17  1349     History   Chief Complaint Chief Complaint  Patient presents with  . Asthma    HPI Margaret [name] is a 71 y.o. female.   Patient presents to clinic requesting nebulizer treatment.  She has asthma and COPD and complains of increased cough and shortness of breath, but denies fever or cold symptoms.  Her inhaler has been less effective, and she has not used her nebulizer.  She has an appointment with her pulmonologist in two days.   The history is provided by the patient.    Past Medical History:  Diagnosis Date  . Anemia   . Anxiety   . Arthritis   . Asthma   . Cancer ([place])   . COPD (chronic obstructive pulmonary disease) ([place])   . Emphysema of lung ([place])   . GERD (gastroesophageal reflux disease)   . Hypertension   . Osteoporosis   . Thyroid disease     Patient Active Problem List   Diagnosis Date Noted  . Iron deficiency anemia 05/31/2017  . Pulmonary nodules 12/26/2016  . Shoulder pain, right 12/25/2016  . Other chronic pain 11/03/2016  . Thyroid nodule 03/30/2016  . Solitary pulmonary nodule 03/30/2016  . Strain of gluteus medius 10/07/2015  . Depression with anxiety 09/29/2015  . Hip pain, acute 08/31/2015  . Vitamin D insufficiency 04/08/2015  . Elevated vitamin B12 level 04/08/2015  . Hirsutism 01/28/2015  . Right knee DJD 12/17/2014  . Osteoarthritis of right knee 11/04/2014  . Pain in right hip 08/21/2014  . Hair loss 08/21/2014  . Hypothyroidism 07/24/2014  . Vasomotor flushing 03/05/2014  . Intrinsic asthma 12/17/2013  . Spinal stenosis of lumbar region 10/14/2013  . Coffee ground emesis 03/06/2013  . Disorder of bursae and tendons in shoulder region 03/01/2013  . Anal fissure 02/22/2013  . Diverticulosis of colon without hemorrhage 02/22/2013  . Hyperglycemia 02/01/2013  . Essential hypertension, benign 01/31/2013  . CAP (community acquired pneumonia)  01/31/2013  . Osteoporosis 01/31/2013  . HSV infection 01/31/2013    Past Surgical History:  Procedure Laterality Date  . ABDOMINAL HYSTERECTOMY    . BREAST SURGERY    . CESAREAN SECTION    . JOINT REPLACEMENT     LT KNEE  . TOTAL KNEE ARTHROPLASTY  2006    OB History    No data available       Home Medications    Prior to Admission medications   Medication Sig Start Date End Date Taking? Authorizing Provider  albuterol (PROVENTIL HFA;VENTOLIN HFA) 108 (90 Base) MCG/ACT inhaler Inhale 1-2 puffs into the lungs every 4 (four) hours as needed for wheezing. 12/26/16   Trixie Dredge, PA-C  amLODipine (NORVASC) 10 MG tablet Take 1 tablet (10 mg total) by mouth daily. 11/03/16   Emeterio Reeve, DO  betamethasone dipropionate (DIPROLENE) 0.05 % cream Apply topically 2 (two) times daily. To affected area(s) as needed 06/19/17   Emeterio Reeve, DO  ferrous sulfate 325 (65 FE) MG EC tablet Take 1 tablet (325 mg total) by mouth 2 (two) times daily with a meal. 05/31/17   Emeterio Reeve, DO  FLUoxetine (PROZAC) 40 MG capsule Take 1 capsule (40 mg total) by mouth daily. 05/18/17   Emeterio Reeve, DO  levothyroxine (SYNTHROID, LEVOTHROID) 50 MCG tablet Take 1 tablet (50 mcg total) by mouth daily before breakfast. 07/11/16   Hommel, Sean, DO  losartan (COZAAR) 100 MG tablet TAKE  1 TABLET BY MOUTH EVERY DAY 06/20/17   Emeterio Reeve, DO  meloxicam (MOBIC) 15 MG tablet Take 15 mg by mouth daily.    [provider]  montelukast (SINGULAIR) 10 MG tablet Take 10 mg by mouth daily. 01/03/17   [provider]  morphine (MSIR) 15 MG tablet Take 15 mg by mouth every 4 (four) hours as needed for severe pain.    [provider]    Family History Family History  Problem Relation Age of Onset  . Stroke Other   . Emphysema Mother 25       Death cause was emphysema.  . Alcohol abuse Father   . COPD Sister   . Alcohol abuse Brother 56  . Cirrhosis  Brother   . Cancer Maternal Grandmother 13       Stomach  . Stroke Maternal Grandfather 75    Social History Social History  Substance Use Topics  . Smoking status: Former Smoker    Packs/day: 1.50    Years: 40.00    Quit date: 02/01/2008  . Smokeless tobacco: Never Used  . Alcohol use Yes     Comment: SOCIALLY     Allergies   Tetracyclines & related; Evista [raloxifene]; Floxin [ofloxacin]; Neosporin [neomycin-polymyxin-gramicidin]; and Other   Review of Systems Review of Systems No sore throat + cough No pleuritic pain + wheezing No nasal congestion No post-nasal drainage No sinus pain/pressure No itchy/red eyes No earache No hemoptysis + SOB No fever/chills No nausea No vomiting No abdominal pain No diarrhea No urinary symptoms No skin rash + fatigue No myalgias No headache    Physical Exam Triage Vital Signs ED Triage Vitals  Enc Vitals Group     BP 07/08/17 1406 111/70     Pulse Rate 07/08/17 1406 84     Resp --      Temp 07/08/17 1406 97.8 F (36.6 C)     Temp Source 07/08/17 1406 Oral     SpO2 07/08/17 1406 96 %     Weight 07/08/17 1407 130 lb (59 kg)     Height --      Head Circumference --      Peak Flow --      Pain Score 07/08/17 1407 0     Pain Loc --      Pain Edu? --      Excl. in [place]? --    No data found.   Updated Vital Signs BP 111/70 (BP Location: Right Arm)   Pulse 84   Temp 97.8 F (36.6 C) (Oral)   Wt 130 lb (59 kg)   SpO2 96%   BMI 25.39 kg/m   Visual Acuity Right Eye Distance:   Left Eye Distance:   Bilateral Distance:    Right Eye Near:   Left Eye Near:    Bilateral Near:     Physical Exam Nursing notes and Vital Signs reviewed. Appearance:  Patient appears stated age, and in no acute distress Eyes:  Pupils are equal, round, and reactive to light and accomodation.  Extraocular movement is intact.  Conjunctivae are not inflamed  Ears:  Canals normal.  Tympanic membranes normal.  Nose:  Mildly congested  turbinates.  No sius tenderness.   Pharynx:  Normal Neck:  Supple. No adenopathy.    Lungs:  Course breath sounds and scattered rhonchi.  Breath sounds are equal.  Moving air well. Heart:  Regular rate and rhythm without murmurs, rubs, or gallops.  Abdomen:  Nontender without masses  or hepatosplenomegaly.  Bowel sounds are present.  No CVA or flank tenderness.  Extremities:  No edema.  Skin:  No rash present.    UC Treatments / Results  Labs (all labs ordered are listed, but only abnormal results are displayed) Labs Reviewed - No data to display  EKG  EKG Interpretation None       Radiology No results found.  Procedures Procedures (including critical care time)  Medications Ordered in UC Medications  ipratropium-albuterol (DUONEB) 0.5-2.5 (3) MG/3ML nebulizer solution 3 mL (3 mLs Nebulization Given 07/08/17 1513)     Initial Impression / Assessment and Plan / UC Course  I have reviewed the triage vital signs and the nursing notes.  Pertinent labs & imaging results that were available during my care of the patient were reviewed by me and considered in my medical decision making (see chart for details).    Administered DuoNeb by hand held nebulizer; patient reports subjective improvement afterwards. Continue albuterol inhaler as prescribed. Followup with pulmonologist as scheduled in two days.    Final Clinical Impressions(s) / UC Diagnoses   Final diagnoses:  Bronchospasm, acute    New Prescriptions New Prescriptions   No medications on file     Kandra Nicolas, MD 07/18/17 [phone]

## 2017-07-08 NOTE — ED Triage Notes (Signed)
Pt c/o SOB and coughing x2 days. She has hx of asthma and COPD. Using inhaler with no relief. Has not used nebulizer.

## 2017-07-10 DIAGNOSIS — J45909 Unspecified asthma, uncomplicated: Secondary | ICD-10-CM | POA: Diagnosis not present

## 2017-07-10 DIAGNOSIS — J441 Chronic obstructive pulmonary disease with (acute) exacerbation: Secondary | ICD-10-CM | POA: Diagnosis not present

## 2017-07-10 DIAGNOSIS — I10 Essential (primary) hypertension: Secondary | ICD-10-CM | POA: Diagnosis not present

## 2017-07-10 DIAGNOSIS — Z01818 Encounter for other preprocedural examination: Secondary | ICD-10-CM | POA: Diagnosis not present

## 2017-07-21 DIAGNOSIS — I10 Essential (primary) hypertension: Secondary | ICD-10-CM | POA: Diagnosis not present

## 2017-07-21 DIAGNOSIS — M25551 Pain in right hip: Secondary | ICD-10-CM | POA: Diagnosis not present

## 2017-07-21 DIAGNOSIS — G8929 Other chronic pain: Secondary | ICD-10-CM | POA: Diagnosis not present

## 2017-07-21 DIAGNOSIS — M545 Low back pain: Secondary | ICD-10-CM | POA: Diagnosis not present

## 2017-07-21 DIAGNOSIS — Z5181 Encounter for therapeutic drug level monitoring: Secondary | ICD-10-CM | POA: Diagnosis not present

## 2017-07-21 DIAGNOSIS — G894 Chronic pain syndrome: Secondary | ICD-10-CM | POA: Diagnosis not present

## 2017-07-21 DIAGNOSIS — Z79899 Other long term (current) drug therapy: Secondary | ICD-10-CM | POA: Diagnosis not present

## 2017-07-27 DIAGNOSIS — I10 Essential (primary) hypertension: Secondary | ICD-10-CM | POA: Diagnosis not present

## 2017-07-27 DIAGNOSIS — Z01818 Encounter for other preprocedural examination: Secondary | ICD-10-CM | POA: Diagnosis not present

## 2017-07-27 DIAGNOSIS — J449 Chronic obstructive pulmonary disease, unspecified: Secondary | ICD-10-CM | POA: Diagnosis not present

## 2017-08-11 DIAGNOSIS — G8929 Other chronic pain: Secondary | ICD-10-CM | POA: Diagnosis not present

## 2017-08-11 DIAGNOSIS — D509 Iron deficiency anemia, unspecified: Secondary | ICD-10-CM | POA: Diagnosis not present

## 2017-08-11 DIAGNOSIS — I10 Essential (primary) hypertension: Secondary | ICD-10-CM | POA: Diagnosis not present

## 2017-08-11 DIAGNOSIS — Z01818 Encounter for other preprocedural examination: Secondary | ICD-10-CM | POA: Diagnosis not present

## 2017-08-11 DIAGNOSIS — E039 Hypothyroidism, unspecified: Secondary | ICD-10-CM | POA: Diagnosis not present

## 2017-08-11 DIAGNOSIS — M19011 Primary osteoarthritis, right shoulder: Secondary | ICD-10-CM | POA: Diagnosis not present

## 2017-08-11 DIAGNOSIS — J449 Chronic obstructive pulmonary disease, unspecified: Secondary | ICD-10-CM | POA: Diagnosis not present

## 2017-08-11 DIAGNOSIS — E785 Hyperlipidemia, unspecified: Secondary | ICD-10-CM | POA: Diagnosis not present

## 2017-08-11 DIAGNOSIS — M549 Dorsalgia, unspecified: Secondary | ICD-10-CM | POA: Diagnosis not present

## 2017-08-14 DIAGNOSIS — J441 Chronic obstructive pulmonary disease with (acute) exacerbation: Secondary | ICD-10-CM | POA: Diagnosis not present

## 2017-08-14 DIAGNOSIS — I10 Essential (primary) hypertension: Secondary | ICD-10-CM | POA: Diagnosis not present

## 2017-08-14 DIAGNOSIS — R062 Wheezing: Secondary | ICD-10-CM | POA: Diagnosis not present

## 2017-08-15 DIAGNOSIS — J449 Chronic obstructive pulmonary disease, unspecified: Secondary | ICD-10-CM | POA: Insufficient documentation

## 2017-08-15 DIAGNOSIS — D649 Anemia, unspecified: Secondary | ICD-10-CM | POA: Insufficient documentation

## 2017-08-16 ENCOUNTER — Other Ambulatory Visit: Payer: Self-pay

## 2017-08-16 DIAGNOSIS — E041 Nontoxic single thyroid nodule: Secondary | ICD-10-CM

## 2017-08-16 DIAGNOSIS — M19011 Primary osteoarthritis, right shoulder: Secondary | ICD-10-CM | POA: Diagnosis not present

## 2017-08-16 MED ORDER — LEVOTHYROXINE SODIUM 50 MCG PO TABS: 50.0000 ug | ORAL_TABLET | Freq: Every day | ORAL | 1 refills | Status: DC

## 2017-08-16 NOTE — Telephone Encounter (Signed)
Patient request refill for Levothyroxine 50 mcg, #90 1 refill sent to [facility]. ,Margaret [name]

## 2017-08-18 DIAGNOSIS — Z471 Aftercare following joint replacement surgery: Secondary | ICD-10-CM | POA: Diagnosis not present

## 2017-08-18 DIAGNOSIS — I10 Essential (primary) hypertension: Secondary | ICD-10-CM | POA: Diagnosis not present

## 2017-08-18 DIAGNOSIS — Z791 Long term (current) use of non-steroidal anti-inflammatories (NSAID): Secondary | ICD-10-CM | POA: Diagnosis not present

## 2017-08-18 DIAGNOSIS — J449 Chronic obstructive pulmonary disease, unspecified: Secondary | ICD-10-CM | POA: Diagnosis not present

## 2017-08-18 DIAGNOSIS — M19011 Primary osteoarthritis, right shoulder: Secondary | ICD-10-CM | POA: Diagnosis not present

## 2017-08-18 DIAGNOSIS — E039 Hypothyroidism, unspecified: Secondary | ICD-10-CM | POA: Diagnosis not present

## 2017-08-18 DIAGNOSIS — Z96652 Presence of left artificial knee joint: Secondary | ICD-10-CM | POA: Diagnosis not present

## 2017-08-18 DIAGNOSIS — G8918 Other acute postprocedural pain: Secondary | ICD-10-CM | POA: Diagnosis not present

## 2017-08-18 DIAGNOSIS — Z87891 Personal history of nicotine dependence: Secondary | ICD-10-CM | POA: Diagnosis not present

## 2017-08-18 DIAGNOSIS — Z96611 Presence of right artificial shoulder joint: Secondary | ICD-10-CM | POA: Diagnosis not present

## 2017-08-18 DIAGNOSIS — Z79899 Other long term (current) drug therapy: Secondary | ICD-10-CM | POA: Diagnosis not present

## 2017-08-18 DIAGNOSIS — K219 Gastro-esophageal reflux disease without esophagitis: Secondary | ICD-10-CM | POA: Diagnosis not present

## 2017-08-21 DIAGNOSIS — I10 Essential (primary) hypertension: Secondary | ICD-10-CM | POA: Diagnosis not present

## 2017-08-21 DIAGNOSIS — M19011 Primary osteoarthritis, right shoulder: Secondary | ICD-10-CM | POA: Diagnosis not present

## 2017-08-21 DIAGNOSIS — Z96611 Presence of right artificial shoulder joint: Secondary | ICD-10-CM | POA: Diagnosis not present

## 2017-09-03 DIAGNOSIS — R69 Illness, unspecified: Secondary | ICD-10-CM | POA: Diagnosis not present

## 2017-09-04 ENCOUNTER — Encounter: Payer: Self-pay | Admitting: Physical Therapy

## 2017-09-04 ENCOUNTER — Ambulatory Visit (INDEPENDENT_AMBULATORY_CARE_PROVIDER_SITE_OTHER): Payer: Medicare HMO | Admitting: Physical Therapy

## 2017-09-04 DIAGNOSIS — R29898 Other symptoms and signs involving the musculoskeletal system: Secondary | ICD-10-CM | POA: Diagnosis not present

## 2017-09-04 DIAGNOSIS — M25611 Stiffness of right shoulder, not elsewhere classified: Secondary | ICD-10-CM

## 2017-09-04 DIAGNOSIS — R531 Weakness: Secondary | ICD-10-CM

## 2017-09-04 NOTE — Telephone Encounter (Signed)
Issue resolved.

## 2017-09-04 NOTE — Patient Instructions (Addendum)
Thoracic Lift    Press shoulders down. Then lift mid-thoracic spine (area between the shoulder blades). Lift the breastbone slightly. Hold _3-5__ seconds. Relax. Repeat _10__ times. Once a day.   Arm Curl - sitting or standing - use full range of motion    Sit or stand with feet shoulder width apart, arms straight down at sides, palms forward. Inhale, then exhale while slowly curling weights ( soup can/ 1#)  toward shoulders and keeping elbows touching torso. Slowly return to starting position. Repeat __10__ times per set. Do _1-3___ sets per session. Do __7__ sessions per week. May be done with dumbbells, tubing or resistive band.  Elbow Flexion: Resisted - can also hold the band in the left hand and straighten the right elbow.     Sit in chair with resistive band secured at base of armrest, right elbow straight. Bend elbow. Repeat __10__ times per set. Do __1-3__ sets per session. Do __1__ sessions per day.  ROM: Pendulum (Circular)    Let right arm move in circle clockwise, then counterclockwise, by rocking body weight in circular pattern. Circle __10__ times each direction per set. Do _1___ sets per session. Do __2-3__ sessions per day.  ROM: Pendulum (Side-to-Side)    Let right arm swing freely from side to side by rocking body weight from side to side. Repeat __10__ times per set. Do _1___ sets per session. Do __2-3__ sessions per day.   Strengthening: Isometric External Rotation    Keeping right elbow at side, use other hand at wrist to apply light resistance to outward motion. Hold __3-5__ seconds. Repeat __10__ times per set. Do __1__ sets per session. Do _1___ sessions per day.  Strengthening: Isometric Internal Rotation    Keeping right elbow at side, use other hand at wrist to apply light resistance to inward motion. Hold __3-5__ seconds. Arm should not move.  Repeat __10__ times per set. Do __1__ sets per session. Do __1__ sessions per day.

## 2017-09-04 NOTE — Therapy (Signed)
[place] [facility] [place], [name], [address] Phone: [phone]   Fax:  [phone]  Physical Therapy Evaluation  Patient Details  Name: Margaret [name] MRN: [id number] Date of Birth: 06/15/1946 Referring Provider: Dr Theone Stanley  Encounter Date: 09/04/2017      PT End of Session - 09/04/17 1015    Visit Number 1   Number of Visits 16   Date for PT Re-Evaluation 11/13/17   PT Start Time 1016   PT Stop Time 1049   PT Time Calculation (min) 33 min   Activity Tolerance Patient tolerated treatment well      Past Medical History:  Diagnosis Date  . Anemia   . Anxiety   . Arthritis   . Asthma   . Cancer ([place])   . COPD (chronic obstructive pulmonary disease) ([place])   . Emphysema of lung ([place])   . GERD (gastroesophageal reflux disease)   . Hypertension   . Osteoporosis   . Thyroid disease     Past Surgical History:  Procedure Laterality Date  . ABDOMINAL HYSTERECTOMY    . BREAST SURGERY    . CESAREAN SECTION    . JOINT REPLACEMENT     LT KNEE  . TOTAL KNEE ARTHROPLASTY  2006    There were no vitals filed for this visit.       Subjective Assessment - 09/04/17 1016    Subjective Pt reports this shoulder surgery has been better than the last one,  she has been taking her own showers, self feeding, does require assist to fix her hair. She had a HHPT and started her on wrist and hand exercise.    Patient Stated Goals get all her function back,    Currently in Pain? 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, manufacturing systems   Leisure travel attend music festivals wet founting - requires rolling of fabric with both arms     Observation/Other Assessments   Focus on Therapeutic Outcomes (FOTO)  58% limited     Posture/Postural Control   Posture/Postural Control Postural limitations   Postural Limitations Forward head;Rounded Shoulders     ROM / Strength   AROM / PROM / Strength AROM;Strength;PROM     AROM   AROM Assessment Site Cervical;Shoulder;Elbow   Right/Left Shoulder --  Lt WNL, Rt NA   Right/Left Elbow --  bilat WNL   Cervical Flexion WNL   Cervical Extension WNL   Cervical - Right Side Bend WNL   Cervical - Left Side Bend WNL   Cervical - Right Rotation WNL   Cervical - Left Rotation WNL     PROM   PROM Assessment Site Shoulder   Right/Left Shoulder Right   Right Shoulder Flexion 120  Degrees   Right Shoulder ABduction 50 Degrees   Right Shoulder Internal Rotation 50 Degrees   Right Shoulder External Rotation 22 Degrees     Strength   Strength Assessment Site Elbow;Shoulder   Right/Left Shoulder --  Lt WNL, Rt NA   Right/Left Elbow --  Lt WNL, Rt grossly 4/5 with soreness     Palpation   Palpation comment tight Rt pecs             Objective measurements completed on examination: See above findings.          [facility] PT Treatment/Exercise - 09/04/17 0001      Exercises   Exercises Shoulder     Shoulder Exercises: Supine   Other Supine Exercises 10 reps thoracic lifts     Shoulder Exercises: Seated   External Rotation Right;10 reps  isometrics   Internal Rotation Right;10 reps  isometrics   Other Seated Exercises bicep curls, triceps with yellow band elbow by side.      Shoulder Exercises: ROM/Strengthening   Pendulum CW/CCW, side/side                PT Education -  09/04/17 1052    Education provided Yes   Education Details HEP   Person(s) Educated Patient   Methods Explanation;Demonstration;Handout   Comprehension Returned demonstration;Verbalized understanding          PT Short Term Goals - 09/04/17 1108      PT SHORT TERM GOAL #1   Title Patient independent in initial HEP 10/02/17   Time 4   Period Weeks   Status New     PT SHORT TERM GOAL #2   Title begin and tolerate AROM of Rt shoulder ( 10/02/17)    Time 4   Period Weeks   Status New     PT SHORT TERM GOAL #3   Title report ability to fix her hair using bilat UE's ( 10/02/17)    Time 4   Period Weeks   Status New     PT SHORT TERM GOAL #4   Title improve FOTO =/< 47% limited ( 10/02/17)    Time 4   Period Weeks   Status New           PT Long Term Goals - 09/04/17 1112      PT LONG TERM GOAL #1   Title I with advanced HEP ( 11/13/17)   Time 10   Period Weeks   Status New     PT LONG TERM GOAL #2   Title AROM Rt shoulder equal or greater than AROM Lt shoulder 11/13/17   Time 10   Period Weeks   Status New     PT LONG TERM GOAL #3   Title 4+/5 to 5/5 strength Rt UE 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  She is wearing her sling full time and perform AROM of the forearm and grip exercise at home. Shoulder protocol explained to patient and the rehab protocol with limit her ability to perform IADLs so the tissue and bone can heal.  She has limited Rt shoulder ROM and  strength, pain has not been an issue to date. This is her dominate side.    Clinical Presentation Stable   Clinical Decision Making Low   Rehab Potential Excellent   PT Frequency 1x / week  for 2-4 wks, then 2 x/wk if needed    PT Duration --  10 weeks   PT Treatment/Interventions Moist Heat;Ultrasound;Therapeutic exercise;Taping;Vasopneumatic Device;Manual techniques;Neuromuscular re-education;Cryotherapy;Electrical Stimulation;Iontophoresis 4mg /ml Dexamethasone;Passive range of motion;Patient/family education;Scar mobilization   PT Next Visit Plan PROM by therapist per protocol, pulleys if no pain, Rt shoulder isometrics, modalities PRN   Consulted and Agree with Plan of Care Patient      Patient will benefit from skilled therapeutic intervention in order to improve the following deficits and impairments:  Decreased range of motion, Impaired UE functional use, Decreased strength, Postural dysfunction, Decreased scar mobility  Visit Diagnosis: Weakness generalized - Plan: PT plan of care cert/re-cert  Stiffness of right shoulder, not elsewhere classified - Plan: PT plan of care cert/re-cert  Other symptoms and signs involving the musculoskeletal system - Plan: PT plan of care cert/re-cert      [facility] PT PB G-CODES - September 23, 2017 1115    Functional Assessment Tool Used  FOTO and professional judgement   Functional Limitations Carrying, moving and handling objects   Carrying, Moving and Handling Objects Current Status [id number]) At least 40 percent but less than 60 percent impaired, limited or restricted   Carrying, Moving and Handling Objects Goal Status (T0354) At least 20 percent but less than 40 percent impaired, limited or restricted       Problem List Patient Active Problem List   Diagnosis Date Noted  . Iron deficiency anemia 05/31/2017  . Pulmonary nodules 12/26/2016  . Shoulder pain, right 12/25/2016  . Other chronic pain 11/03/2016  . Thyroid nodule 03/30/2016  . Solitary  pulmonary nodule 03/30/2016  . Strain of gluteus medius 10/07/2015  . Depression with anxiety 09/29/2015  . Hip pain, acute 08/31/2015  . Vitamin D insufficiency 04/08/2015  . Elevated vitamin B12 level 04/08/2015  . Hirsutism 01/28/2015  . Right knee DJD 12/17/2014  . Osteoarthritis of right knee 11/04/2014  . Pain in right hip 08/21/2014  . Hair loss 08/21/2014  . Hypothyroidism 07/24/2014  . Vasomotor flushing 03/05/2014  . Intrinsic asthma 12/17/2013  . Spinal stenosis of lumbar region 10/14/2013  . Coffee ground emesis 03/06/2013  . Disorder of bursae and tendons in shoulder region 03/01/2013  . Anal fissure 02/22/2013  . Diverticulosis of colon without hemorrhage 02/22/2013  . Hyperglycemia 02/01/2013  . Essential hypertension, benign 01/31/2013  . CAP (community acquired pneumonia) 01/31/2013  . Osteoporosis 01/31/2013  . HSV infection 01/31/2013    Jeral Pinch PT  2017/09/23, [address] [place], [name], [address] Phone: [phone]   Fax:  [phone]  Name: Margaret [name] MRN: [id number] Date of Birth: September 20, 1946

## 2017-09-11 ENCOUNTER — Ambulatory Visit (INDEPENDENT_AMBULATORY_CARE_PROVIDER_SITE_OTHER): Payer: Medicare HMO | Admitting: Physical Therapy

## 2017-09-11 DIAGNOSIS — R29898 Other symptoms and signs involving the musculoskeletal system: Secondary | ICD-10-CM

## 2017-09-11 DIAGNOSIS — M25611 Stiffness of right shoulder, not elsewhere classified: Secondary | ICD-10-CM

## 2017-09-11 DIAGNOSIS — R531 Weakness: Secondary | ICD-10-CM

## 2017-09-11 NOTE — Patient Instructions (Signed)
Strengthening: Isometric Abduction    Using wall for resistance, press right arm into ball using light pressure. Hold _10___ seconds. Repeat _10___ times per set. Do __1-3__ sets per session. Do __2__ sessions per day.  Shoulder Blade Squeeze    Rotate shoulders back, then squeeze shoulder blades together. Hold 10 seconds. Repeat __10__ times. Do __2__ sessions per day.   [facility] [place], [place] [address]  [phone] (office) [phone] (fax)

## 2017-09-11 NOTE — Therapy (Signed)
[facility], [name], [address] Phone: [phone]   Fax:  [phone]  Physical Therapy Treatment  Patient Details  Name: Margaret [name] MRN: [id number] Date of Birth: 1946-01-20 Referring Provider: Dr. Theone Stanley  Encounter Date: 09/11/2017      PT End of Session - 09/11/17 1103    Visit Number 2   Number of Visits 16   Date for PT Re-Evaluation 11/13/17   PT Start Time 1103   PT Stop Time 1143   PT Time Calculation (min) 40 min      Past Medical History:  Diagnosis Date  . Anemia   . Anxiety   . Arthritis   . Asthma   . Cancer ([place])   . COPD (chronic obstructive pulmonary disease) ([place])   . Emphysema of lung ([place])   . GERD (gastroesophageal reflux disease)   . Hypertension   . Osteoporosis   . Thyroid disease     Past Surgical History:  Procedure Laterality Date  . ABDOMINAL HYSTERECTOMY    . BREAST SURGERY    . CESAREAN SECTION    . JOINT REPLACEMENT     LT KNEE  . TOTAL KNEE ARTHROPLASTY  2006    There were no vitals filed for this visit.      Subjective Assessment - 09/11/17 1109    Subjective Margaret [name] reports she has been compliant with HEP.  She is wearing sling on-off throughout day.  If sitting, she supports her arm with pillow.  Nothing new to report since last visit.    Patient Stated Goals get all her function back,    Currently in Pain? No/denies            [facility] PT Assessment - 09/11/17 0001      Assessment   Medical Diagnosis Rt reverse total shoulder ( 08/18/17)    Referring Provider Dr. Theone Stanley   Onset Date/Surgical Date 08/18/17   Hand Dominance Right   Next MD Visit 09/24/17   Prior Therapy [facility]PT for one visit     Precautions   Required Braces or Orthoses Sling  at all times     PROM   Right/Left Shoulder Right   Right Shoulder Flexion 90 Degrees   Right Shoulder ABduction 90 Degrees  scaption   Right Shoulder External Rotation 30  Degrees          [facility] Adult PT Treatment/Exercise - 09/11/17 0001      Shoulder Exercises: Seated   Other Seated Exercises bicep curls AROM with RUE x 20 reps.   scap squeeze x 10 reps x 10 sec hold.      Shoulder Exercises: Standing   Other Standing Exercises RUE pendulum x 30 reps     Shoulder Exercises: Isometric Strengthening   External Rotation --  RUE, 5 sec, 10 reps   Internal Rotation --  RUE, 5 sec x 10 reps   ABduction --  RUE, 5 sec x 10 reps     Modalities   Modalities --  pt declined     Manual Therapy   Manual Therapy Passive ROM;Soft tissue mobilization   Manual therapy comments gentle scap mobs in all directions, passive circumduction    Soft tissue mobilization pecs; upper trap; leveator; teres   Passive ROM Rt shoulder:  flex and scap to 90 deg, ER to 30 deg, ext, IR                 PT Education -  09/11/17 1156    Education provided Yes   Education Details HEP - added abd isometric and seated scap squeeze.    Person(s) Educated Patient   Methods Explanation;Demonstration;Verbal cues;Handout   Comprehension Verbalized understanding;Returned demonstration          PT Short Term Goals - 09/11/17 1110      PT SHORT TERM GOAL #1   Title Patient independent in initial HEP 10/02/17   Time 4   Period Weeks   Status On-going     PT SHORT TERM GOAL #2   Title begin and tolerate AROM of Rt shoulder ( 10/02/17)    Time 4   Period Weeks   Status On-going     PT SHORT TERM GOAL #3   Title report ability to fix her hair using bilat UE's ( 10/02/17)    Time 4   Period Weeks   Status On-going     PT SHORT TERM GOAL #4   Title improve FOTO =/< 47% limited ( 10/02/17)    Time 4   Period Weeks   Status On-going           PT Long Term Goals - 09/11/17 1111      PT LONG TERM GOAL #1   Title I with advanced HEP ( 11/13/17)   Time 10   Period Weeks   Status On-going     PT LONG TERM GOAL #2   Title AROM Rt shoulder equal or  greater than AROM Lt shoulder 11/13/17   Time 10   Period Weeks   Status On-going     PT LONG TERM GOAL #3   Title 4+/5 to 5/5 strength Rt UE to 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  Pt somewhat guarded with PROM. ROM kept within protocol. Encouraged pt to wear sling and follow protocol per MD's orders.  She will be 4 wks s/p Reverse Total shoulder on 10/12. Progressing towards goals.    Rehab Potential Excellent   PT Frequency 1x / week  see eval for freq note   PT Duration --  10 wks   PT Treatment/Interventions Moist Heat;Ultrasound;Therapeutic exercise;Taping;Vasopneumatic Device;Manual techniques;Neuromuscular re-education;Cryotherapy;Electrical Stimulation;Iontophoresis 4mg /ml Dexamethasone;Passive range of motion;Patient/family education;Scar mobilization   PT Next Visit Plan continue ROM per protocol.  modalities PRN.    Consulted and Agree with Plan of Care Patient      Patient will benefit from skilled therapeutic intervention in order to improve the following deficits and impairments:  Decreased range of motion, Impaired UE functional use, Decreased strength, Postural dysfunction, Decreased scar mobility  Visit Diagnosis: Weakness generalized  Stiffness of right shoulder, not elsewhere classified  Other symptoms and signs involving the musculoskeletal system     Problem List Patient Active Problem List   Diagnosis Date Noted  . Iron deficiency  anemia 05/31/2017  . Pulmonary nodules 12/26/2016  . Shoulder pain, right 12/25/2016  . Other chronic pain 11/03/2016  . Thyroid nodule 03/30/2016  . Solitary pulmonary nodule 03/30/2016  . Strain of gluteus medius 10/07/2015  . Depression with anxiety 09/29/2015  . Hip pain, acute 08/31/2015  . Vitamin D insufficiency 04/08/2015  . Elevated vitamin B12 level 04/08/2015  . Hirsutism 01/28/2015  . Right knee DJD 12/17/2014  . Osteoarthritis of right knee 11/04/2014  . Pain in right hip 08/21/2014  . Hair loss 08/21/2014  . Hypothyroidism 07/24/2014  . Vasomotor flushing 03/05/2014  . Intrinsic asthma 12/17/2013  . Spinal stenosis of lumbar region 10/14/2013  . Coffee ground emesis 03/06/2013  . Disorder of bursae and tendons in shoulder region 03/01/2013  . Anal fissure 02/22/2013  . Diverticulosis of colon without hemorrhage 02/22/2013  . Hyperglycemia 02/01/2013  . Essential hypertension, benign 01/31/2013  . CAP (community acquired pneumonia) 01/31/2013  . Osteoporosis 01/31/2013  . HSV infection 01/31/2013   Kerin Perna, PTA 09/11/17 12:15 PM  [place], [name], [address] Phone: [phone]   Fax:  [phone]  Name: Margaret [name] MRN: [id number] Date of Birth: May 09, 1946

## 2017-09-18 ENCOUNTER — Ambulatory Visit (INDEPENDENT_AMBULATORY_CARE_PROVIDER_SITE_OTHER): Payer: Medicare HMO | Admitting: Physical Therapy

## 2017-09-18 DIAGNOSIS — R531 Weakness: Secondary | ICD-10-CM

## 2017-09-18 DIAGNOSIS — M25611 Stiffness of right shoulder, not elsewhere classified: Secondary | ICD-10-CM

## 2017-09-18 DIAGNOSIS — R29898 Other symptoms and signs involving the musculoskeletal system: Secondary | ICD-10-CM

## 2017-09-18 NOTE — Therapy (Signed)
[facility] [place], [name], [address] Phone: [phone]   Fax:  [phone]  Physical Therapy Treatment  Patient Details  Name: Margaret [name] MRN: [id number] Date of Birth: 1945-12-16 Referring Provider: Dr. Theone Stanley  Encounter Date: 09/18/2017      PT End of Session - 09/18/17 1107    Visit Number 3   Number of Visits 16   Date for PT Re-Evaluation 11/13/17   PT Start Time 1104   PT Stop Time 1145   PT Time Calculation (min) 41 min   Activity Tolerance Patient tolerated treatment well      Past Medical History:  Diagnosis Date  . Anemia   . Anxiety   . Arthritis   . Asthma   . Cancer ([place])   . COPD (chronic obstructive pulmonary disease) ([place])   . Emphysema of lung ([place])   . GERD (gastroesophageal reflux disease)   . Hypertension   . Osteoporosis   . Thyroid disease     Past Surgical History:  Procedure Laterality Date  . ABDOMINAL HYSTERECTOMY    . BREAST SURGERY    . CESAREAN SECTION    . JOINT REPLACEMENT     LT KNEE  . TOTAL KNEE ARTHROPLASTY  2006    There were no vitals filed for this visit.      Subjective Assessment - 09/18/17 1108    Subjective Margaret [name] reports her back has been bothering her a lot since Friday.  She presents to therapy without her sling on Rt shoulder, but arm against body with elbow flexed, "I feel like I'm protecting my arm with my arm held this way".  She wears her sling to bed and most of day.   Her Rt shoulder is sore when she moves it past a comfortable range.    Currently in Pain? Yes   Pain Score 8    Pain Location Back   Pain Orientation Lower;Right;Left   Aggravating Factors  bending over   Pain Relieving Factors TENS, medicine            [facility] PT Assessment - 09/18/17 0001      Assessment   Medical Diagnosis Rt reverse total shoulder ( 08/18/17)    Referring Provider Dr. Theone Stanley   Onset Date/Surgical Date 08/18/17   Hand  Dominance Right   Next MD Visit 09/24/17     PROM   Right Shoulder Flexion 90 Degrees   Right Shoulder ABduction 90 Degrees  scaption   Right Shoulder External Rotation 30 Degrees         [facility] Adult PT Treatment/Exercise - 09/18/17 0001      Shoulder Exercises: Supine   Flexion AAROM;Both;10 reps  cane, 2 sets   Other Supine Exercises 10 reps thoracic lifts     Shoulder Exercises: Seated   Internal Rotation AAROM;Right;15 reps  cane, gentle     Shoulder Exercises: Pulleys   Flexion --  10 reps, 10 sec hold   ABduction --  10 reps, 10 sec hold     Shoulder Exercises: Isometric Strengthening   External Rotation --  RUE, 10 sec, 10 reps   Internal Rotation --  RUE, 10 sec x 10 reps   ABduction --  RUE, 10 sec x 10 reps     Modalities   Modalities --  pt declined     Manual Therapy   Manual therapy comments gentle scap mobs in all directions, passive circumduction, gentle long arm traction to  reduce guarding.     Passive ROM Rt shoulder:  flex and scap to 90 deg, ER to 30 deg                  PT Short Term Goals - 09/11/17 1110      PT SHORT TERM GOAL #1   Title Patient independent in initial HEP 10/02/17   Time 4   Period Weeks   Status On-going     PT SHORT TERM GOAL #2   Title begin and tolerate AROM of Rt shoulder ( 10/02/17)    Time 4   Period Weeks   Status On-going     PT SHORT TERM GOAL #3   Title report ability to fix her hair using bilat UE's ( 10/02/17)    Time 4   Period Weeks   Status On-going     PT SHORT TERM GOAL #4   Title improve FOTO =/< 47% limited ( 10/02/17)    Time 4   Period Weeks   Status On-going           PT Long Term Goals - 09/11/17 1111      PT LONG TERM GOAL #1   Title I with advanced HEP ( 11/13/17)   Time 10   Period Weeks   Status On-going     PT LONG TERM GOAL #2   Title AROM Rt shoulder equal or greater than AROM Lt shoulder 11/13/17   Time 10   Period Weeks   Status On-going     PT  LONG TERM GOAL #3   Title 4+/5 to 5/5 strength Rt UE to 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 She tolerated therapy with less pain, and less guarding with PROM. ROM kept with MD protocol. Reviewed protocol with pt.  Progressing towards goals.  Encouraged pt to use compression/ice to address swelling in Rt elbow, and to ice Rt shoulder after exercises each day.    Rehab Potential Excellent   PT Frequency 1x / week  see eval for freq note   PT Duration --  10 wks   PT Treatment/Interventions Moist Heat;Ultrasound;Therapeutic exercise;Taping;Vasopneumatic Device;Manual techniques;Neuromuscular re-education;Cryotherapy;Electrical Stimulation;Iontophoresis 4mg /ml Dexamethasone;Passive range of motion;Patient/family education;Scar mobilization   PT Next Visit Plan continue ROM per protocol.  modalities PRN.    Consulted and Agree with Plan of Care Patient      Patient will benefit from skilled therapeutic intervention in order to improve the following deficits and impairments:  Decreased range of motion, Impaired UE functional use, Decreased strength, Postural dysfunction, Decreased scar mobility  Visit Diagnosis: Weakness generalized  Stiffness of right shoulder, not elsewhere classified  Other symptoms and signs involving the musculoskeletal system     Problem List Patient Active Problem List   Diagnosis Date Noted  . Iron deficiency anemia 05/31/2017  .  Pulmonary nodules 12/26/2016  .  Shoulder pain, right 12/25/2016  . Other chronic pain 11/03/2016  . Thyroid nodule 03/30/2016  . Solitary pulmonary nodule 03/30/2016  . Strain of gluteus medius 10/07/2015  . Depression with anxiety 09/29/2015  . Hip pain, acute 08/31/2015  . Vitamin D insufficiency 04/08/2015  . Elevated vitamin B12 level 04/08/2015  . Hirsutism 01/28/2015  . Right knee DJD 12/17/2014  . Osteoarthritis of right knee 11/04/2014  . Pain in right hip 08/21/2014  . Hair loss 08/21/2014  . Hypothyroidism 07/24/2014  . Vasomotor flushing 03/05/2014  . Intrinsic asthma 12/17/2013  . Spinal stenosis of lumbar region 10/14/2013  . Coffee ground emesis 03/06/2013  . Disorder of bursae and tendons in shoulder region 03/01/2013  . Anal fissure 02/22/2013  . Diverticulosis of colon without hemorrhage 02/22/2013  . Hyperglycemia 02/01/2013  . Essential hypertension, benign 01/31/2013  . CAP (community acquired pneumonia) 01/31/2013  . Osteoporosis 01/31/2013  . HSV infection 01/31/2013    Shelbie Hutching 09/18/2017, 12:58 PM  [facility] [place], [name], [address] Phone: [phone]   Fax:  [phone]  Name: Margaret [name] MRN: [id number] Date of Birth: [birth year]

## 2017-09-25 ENCOUNTER — Ambulatory Visit (INDEPENDENT_AMBULATORY_CARE_PROVIDER_SITE_OTHER): Payer: Medicare HMO | Admitting: Physical Therapy

## 2017-09-25 DIAGNOSIS — M25511 Pain in right shoulder: Secondary | ICD-10-CM

## 2017-09-25 DIAGNOSIS — R29898 Other symptoms and signs involving the musculoskeletal system: Secondary | ICD-10-CM | POA: Diagnosis not present

## 2017-09-25 DIAGNOSIS — R531 Weakness: Secondary | ICD-10-CM

## 2017-09-25 DIAGNOSIS — M25611 Stiffness of right shoulder, not elsewhere classified: Secondary | ICD-10-CM

## 2017-09-25 NOTE — Therapy (Signed)
[place], [name], [address] Phone: [phone]   Fax:  [phone]  Physical Therapy Treatment  Patient Details  Name: Nilsa Macht MRN: [id number] Date of Birth: 02-Jan-1946 Referring Provider: Dr. Theone Stanley  Encounter Date: 09/25/2017      PT End of Session - 09/25/17 1102    Visit Number 4   Number of Visits 16   Date for PT Re-Evaluation 11/13/17   PT Start Time 1102   PT Stop Time 1145   PT Time Calculation (min) 43 min   Activity Tolerance Patient tolerated treatment well   Behavior During Therapy [facility] for tasks assessed/performed      Past Medical History:  Diagnosis Date  . Anemia   . Anxiety   . Arthritis   . Asthma   . Cancer ([place])   . COPD (chronic obstructive pulmonary disease) ([place])   . Emphysema of lung ([place])   . GERD (gastroesophageal reflux disease)   . Hypertension   . Osteoporosis   . Thyroid disease     Past Surgical History:  Procedure Laterality Date  . ABDOMINAL HYSTERECTOMY    . BREAST SURGERY    . CESAREAN SECTION    . JOINT REPLACEMENT     LT KNEE  . TOTAL KNEE ARTHROPLASTY  2006    There were no vitals filed for this visit.      Subjective Assessment - 09/25/17 1102    Subjective Pt reports no new changes since last visit.  She continues to wear sling for sleeping at night, and only wears it during day if it hurts. She has been doing isometrics for shoulder on and off throughout day. She returns to MD today for follow up.    Patient Stated Goals get all her function back,    Currently in Pain? No/denies   Pain Score 0-No pain            [facility] PT Assessment - 09/25/17 0001      Assessment   Medical Diagnosis Rt reverse total shoulder ( 08/18/17)    Referring Provider Dr. Theone Stanley   Onset Date/Surgical Date 08/18/17   Hand Dominance Right   Next MD Visit 09/25/2017     Precautions   Precautions --  NWB Rt UE     PROM   PROM Assessment Site Shoulder  measured in supine   Right/Left Shoulder Right   Right Shoulder Flexion 105 Degrees   Right Shoulder ABduction 90 Degrees  scaption   Right Shoulder Internal Rotation 50 Degrees   Right Shoulder External Rotation 35 Degrees     Palpation   Palpation comment Rt elbow with palpable swelling.            [facility] Adult PT Treatment/Exercise - 09/25/17 0001      Shoulder Exercises: Supine   External Rotation AAROM;Right;10 reps  cane     Shoulder Exercises: Seated   Other Seated Exercises bicep curls AROM with RUE x 20 reps.   scap squeeze x 10 reps x 10 sec hold.    Other Seated Exercises scap retraction and depression with hand into ball x 5 sec x 10 reps, then small circles CW/CCW, then scap depression with rhythmic stabilization x 30 sec x 4 reps.      Shoulder Exercises: Pulleys   Flexion --  10 reps, 10 sec hold   ABduction --  10 reps, 10 sec hold     Shoulder Exercises: Isometric Strengthening  Flexion --  10 reps, 10 second hold    Extension --  10 reps, 10 second hold    External Rotation --  RUE, 10 sec, 10 reps   Internal Rotation --  RUE, 10 sec x 10 reps   ABduction --  RUE, 10 sec x 10 reps     Manual Therapy   Manual therapy comments pt less guarded than last visit.    Passive ROM Rt shoulder:  scaption, flexion, ER (within protocol).              PT Short Term Goals - 09/25/17 1112      PT SHORT TERM GOAL #1   Title Patient independent in initial HEP 10/02/17   Time 4   Period Weeks   Status On-going     PT SHORT TERM GOAL #2   Title begin and tolerate AROM of Rt shoulder ( 10/02/17)    Time 4   Period Weeks   Status On-going     PT SHORT TERM GOAL #3   Title report ability to fix her hair using bilat UE's ( 10/02/17)    Time 4   Period Weeks   Status On-going     PT SHORT TERM GOAL #4   Title improve FOTO =/< 47% limited ( 10/02/17)    Time 4   Period Weeks   Status On-going           PT  Long Term Goals - 09/25/17 1111      PT LONG TERM GOAL #1   Title I with advanced HEP ( 11/13/17)   Time 10   Period Weeks   Status On-going     PT LONG TERM GOAL #2   Title AROM Rt shoulder equal or greater than AROM Lt shoulder 11/13/17   Time 10   Period Weeks   Status On-going     PT LONG TERM GOAL #3   Title 4+/5 to 5/5 strength Rt UE to allow 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 She tolerated therapy without pain, and less guarding with PROM. ROM kept with MD protocol. Progressing towards goals.  Encouraged pt to remain within protocol and to ice Rt shoulder after exercises each day.   Rehab Potential Excellent   PT Frequency 1x / week  see freq in eval note   PT Duration --  10 wks   PT Treatment/Interventions Moist Heat;Ultrasound;Therapeutic exercise;Taping;Vasopneumatic Device;Manual techniques;Neuromuscular re-education;Cryotherapy;Electrical Stimulation;Iontophoresis 4mg /ml Dexamethasone;Passive range of motion;Patient/family education;Scar mobilization   PT Next Visit Plan continue ROM and scap strengthening for Rt shoulder per protocol.  modalities PRN.    Consulted and Agree with Plan of Care Patient      Patient will benefit from skilled therapeutic intervention in order to improve the following deficits and impairments:  Decreased range of motion, Impaired  UE functional use, Decreased strength, Postural dysfunction, Decreased scar mobility  Visit Diagnosis: Weakness  generalized  Stiffness of right shoulder, not elsewhere classified  Other symptoms and signs involving the musculoskeletal system  Acute pain of right shoulder     Problem List Patient Active Problem List   Diagnosis Date Noted  . Iron deficiency anemia 05/31/2017  . Pulmonary nodules 12/26/2016  . Shoulder pain, right 12/25/2016  . Other chronic pain 11/03/2016  . Thyroid nodule 03/30/2016  . Solitary pulmonary nodule 03/30/2016  . Strain of gluteus medius 10/07/2015  . Depression with anxiety 09/29/2015  . Hip pain, acute 08/31/2015  . Vitamin D insufficiency 04/08/2015  . Elevated vitamin B12 level 04/08/2015  . Hirsutism 01/28/2015  . Right knee DJD 12/17/2014  . Osteoarthritis of right knee 11/04/2014  . Pain in right hip 08/21/2014  . Hair loss 08/21/2014  . Hypothyroidism 07/24/2014  . Vasomotor flushing 03/05/2014  . Intrinsic asthma 12/17/2013  . Spinal stenosis of lumbar region 10/14/2013  . Coffee ground emesis 03/06/2013  . Disorder of bursae and tendons in shoulder region 03/01/2013  . Anal fissure 02/22/2013  . Diverticulosis of colon without hemorrhage 02/22/2013  . Hyperglycemia 02/01/2013  . Essential hypertension, benign 01/31/2013  . CAP (community acquired pneumonia) 01/31/2013  . Osteoporosis 01/31/2013  . HSV infection 01/31/2013   Kerin Perna, PTA 09/25/17 1:07 PM  [place], [name], [address] Phone: [phone]   Fax:  [phone]  Name: Kortnie Stovall MRN: [id number] Date of Birth: 04-23-1946

## 2017-10-02 ENCOUNTER — Encounter: Payer: Medicare HMO | Admitting: Physical Therapy

## 2017-10-03 ENCOUNTER — Encounter: Payer: Self-pay | Admitting: Osteopathic Medicine

## 2017-10-03 ENCOUNTER — Ambulatory Visit (INDEPENDENT_AMBULATORY_CARE_PROVIDER_SITE_OTHER): Payer: Medicare HMO | Admitting: Physical Therapy

## 2017-10-03 ENCOUNTER — Ambulatory Visit (INDEPENDENT_AMBULATORY_CARE_PROVIDER_SITE_OTHER): Payer: Medicare HMO | Admitting: Osteopathic Medicine

## 2017-10-03 VITALS — BP 129/79 | HR 69 | Ht 60.0 in | Wt 133.0 lb

## 2017-10-03 DIAGNOSIS — R531 Weakness: Secondary | ICD-10-CM | POA: Diagnosis not present

## 2017-10-03 DIAGNOSIS — E039 Hypothyroidism, unspecified: Secondary | ICD-10-CM

## 2017-10-03 DIAGNOSIS — M25511 Pain in right shoulder: Secondary | ICD-10-CM

## 2017-10-03 DIAGNOSIS — L570 Actinic keratosis: Secondary | ICD-10-CM

## 2017-10-03 DIAGNOSIS — R29898 Other symptoms and signs involving the musculoskeletal system: Secondary | ICD-10-CM | POA: Diagnosis not present

## 2017-10-03 DIAGNOSIS — M25611 Stiffness of right shoulder, not elsewhere classified: Secondary | ICD-10-CM | POA: Diagnosis not present

## 2017-10-03 MED ORDER — THYROID 30 MG PO TABS: 30.0000 mg | ORAL_TABLET | Freq: Every day | ORAL | 0 refills | Status: DC

## 2017-10-03 NOTE — Progress Notes (Signed)
HPI: Margaret [name] is a 71 y.o. female with past history of  has a past medical history of Anemia; Anxiety; Arthritis; Asthma; Cancer ([place]); COPD (chronic obstructive pulmonary disease) ([place]); Emphysema of lung ([place]); GERD (gastroesophageal reflux disease); Hypertension; Osteoporosis; and Thyroid disease.  who presents to [facility] today, 10/03/17,  for chief complaint of:  Chief Complaint  Patient presents with  . Nevus    left side of face    Growth on left side of face on the temple.  It has been getting a bit more itchy lately.  Wants to know if this is something dangerous that needs to be removed right away, or could dermatology referral take care of this.  She has a few other skin concerns on the face which she would like to address with a dermatologist.  Hair loss/Hypothyroidism: Patient would like to discuss changing levothyroxine to "natural thyroid medicine".   Past medical history, surgical history, social history and family history reviewed.  Patient Active Problem List   Diagnosis Date Noted  . Iron deficiency anemia 05/31/2017  . Pulmonary nodules 12/26/2016  . Shoulder pain, right 12/25/2016  . Other chronic pain 11/03/2016  . Thyroid nodule 03/30/2016  . Solitary pulmonary nodule 03/30/2016  . Strain of gluteus medius 10/07/2015  . Depression with anxiety 09/29/2015  . Hip pain, acute 08/31/2015  . Vitamin D insufficiency 04/08/2015  . Elevated vitamin B12 level 04/08/2015  . Hirsutism 01/28/2015  . Right knee DJD 12/17/2014  . Osteoarthritis of right knee 11/04/2014  . Pain in right hip 08/21/2014  . Hair loss 08/21/2014  . Hypothyroidism 07/24/2014  . Vasomotor flushing 03/05/2014  . Intrinsic asthma 12/17/2013  . Spinal stenosis of lumbar region 10/14/2013  . Coffee ground emesis 03/06/2013  . Disorder of bursae and tendons in shoulder region 03/01/2013  . Anal fissure 02/22/2013  . Diverticulosis of colon without  hemorrhage 02/22/2013  . Hyperglycemia 02/01/2013  . Essential hypertension, benign 01/31/2013  . CAP (community acquired pneumonia) 01/31/2013  . Osteoporosis 01/31/2013  . HSV infection 01/31/2013    Current medication list and allergy/intolerance information reviewed.    Current Outpatient Prescriptions on File Prior to Visit  Medication Sig Dispense Refill  . albuterol (PROVENTIL HFA;VENTOLIN HFA) 108 (90 Base) MCG/ACT inhaler Inhale 1-2 puffs into the lungs every 4 (four) hours as needed for wheezing. 1 Inhaler 0  . amLODipine (NORVASC) 10 MG tablet Take 1 tablet (10 mg total) by mouth daily. 90 tablet 3  . betamethasone dipropionate (DIPROLENE) 0.05 % cream Apply topically 2 (two) times daily. To affected area(s) as needed 45 g 0  . ferrous sulfate 325 (65 FE) MG EC tablet Take 1 tablet (325 mg total) by mouth 2 (two) times daily with a meal. 180 tablet 1  . FLUoxetine (PROZAC) 40 MG capsule Take 1 capsule (40 mg total) by mouth daily. 90 capsule 3  . levothyroxine (SYNTHROID, LEVOTHROID) 50 MCG tablet Take 1 tablet (50 mcg total) by mouth daily before breakfast. 90 tablet 1  . losartan (COZAAR) 100 MG tablet TAKE 1 TABLET BY MOUTH EVERY DAY 90 tablet 1  . meloxicam (MOBIC) 15 MG tablet Take 15 mg by mouth daily.    . montelukast (SINGULAIR) 10 MG tablet Take 10 mg by mouth daily.  2  . morphine (MSIR) 15 MG tablet Take 15 mg by mouth every 4 (four) hours as needed for severe pain.     No current facility-administered medications on file prior to visit.  Allergies  Allergen Reactions  . Tetracyclines & Related     Other reaction(s): Other SERVER INDIGESTION  . Evista [Raloxifene]     Back pain  . Floxin [Ofloxacin] Hives  . Neosporin [Neomycin-Polymyxin-Gramicidin] Hives  . Other     ANTIBIOTIC OINT - UNSURE OF NAME       Review of Systems:  Constitutional: No recent illness  HEENT: No  headache, no vision change  Cardiac: No  chest pain  Respiratory:  No   shortness of breath.  Musculoskeletal: No new myalgia/arthralgia  Skin: +Rash and hair loss as per HPI  Exam:  BP 129/79   Pulse 69   Ht 5' (1.524 m)   Wt 133 lb (60.3 kg)   BMI 25.97 kg/m   Constitutional: VS see above. General Appearance: alert, well-developed, well-nourished, NAD  Eyes: Normal lids and conjunctive, non-icteric sclera  Ears, Nose, Mouth, Throat: MMM, Normal external inspection ears/nares/mouth/lips/gums.  Neck: No masses, trachea midline.   Respiratory: Normal respiratory effort.    Musculoskeletal: Gait normal. Symmetric and independent movement of all extremities  Neurological: Normal balance/coordination. No tremor.  Skin: warm, dry, intact. See skin below for lesion in question  Psychiatric: Normal judgment/insight. Normal mood and affect. Oriented x3.          ASSESSMENT/PLAN:  Warty skin growth appears benign to me, either classic wart versus actinic lesion as opposed to a cell or squamous cell.  Offered removal versus cryotherapy here in the office, advised on risk of scarring however and patient would rather speak with dermatology first.  She has a dermatologist in mind but cannot recall the name, she will contact us with the name of a dermatologist and I will be happy to place a referral  Converted levothyroxine to [place] Thyroid plan to recheck TSH 2 mos  Keratosis, actinic  Hypothyroidism, unspecified type - Plan: TSH    Patient Instructions  Plan:  Call/message [place] with the name of a dermatologist and I'm happy to place a referral   Thyroid meds changed - if doing well and TSH levels ok can continue the medicine, if any problems come see me in the office to discuss     Follow-up plan: Return for LAB VISIT ONLY 2 months - recheck thyroid levels .  Visit summary with medication list and pertinent instructions was printed for patient to review, alert [place] if any changes needed. All questions at time of visit were answered - patient  instructed to contact office with any additional concerns. ER/RTC precautions were reviewed with the patient and understanding verbalized.   Note: Total time spent 25 minutes, greater than 50% of the visit was spent face-to-face counseling and coordinating care for the following: The primary encounter diagnosis was Keratosis, actinic. A diagnosis of Hypothyroidism, unspecified type was also pertinent to this visit.[name]  Please note: voice recognition software was used to produce this document, and typos may escape review. Please contact me for any needed clarifications.

## 2017-10-03 NOTE — Therapy (Signed)
[place] [address] [place], [name], [address] Phone: [phone]   Fax:  [phone]  Physical Therapy Treatment  Patient Details  Name: Margaret [name] MRN: [id number] Date of Birth: 1946-03-08 Referring Provider: Dr. Theone Stanley  Encounter Date: 10/03/2017      PT End of Session - 10/03/17 1020    Visit Number 5   Number of Visits 16   Date for PT Re-Evaluation 11/13/17   PT Start Time 0935   PT Stop Time 1013   PT Time Calculation (min) 38 min   Activity Tolerance Patient tolerated treatment well   Behavior During Therapy [facility] for tasks assessed/performed      Past Medical History:  Diagnosis Date  . Anemia   . Anxiety   . Arthritis   . Asthma   . Cancer ([place])   . COPD (chronic obstructive pulmonary disease) ([place])   . Emphysema of lung ([place])   . GERD (gastroesophageal reflux disease)   . Hypertension   . Osteoporosis   . Thyroid disease     Past Surgical History:  Procedure Laterality Date  . ABDOMINAL HYSTERECTOMY    . BREAST SURGERY    . CESAREAN SECTION    . JOINT REPLACEMENT     LT KNEE  . TOTAL KNEE ARTHROPLASTY  2006    There were no vitals filed for this visit.      Subjective Assessment - 10/03/17 0945    Subjective Pt reports she had follow up with MD last wk.  "He said I'm doing fantastic".  She has a new script for continued therapy (see chart). He told her she has bursitis in her Rt elbow; wants her to keep on it.  She is proud of her progress so far.    Currently in Pain? No/denies   Pain Score 0-No pain            [facility] PT Assessment - 10/03/17 0001      Assessment   Medical Diagnosis Rt reverse total shoulder ( 08/18/17)    Referring Provider Dr. Theone Stanley   Onset Date/Surgical Date 08/18/17   Hand Dominance Right   Next MD Visit 11/07/17     PROM   Right Shoulder External Rotation 45 Degrees           [facility] Adult PT Treatment/Exercise - 10/03/17 0001       Shoulder Exercises: Standing   Internal Rotation 10 reps;AAROM  cane, to tolerance (limited)     Shoulder Exercises: Pulleys   Flexion --  10 reps, 10 sec hold   ABduction --  10 reps, 10 sec hold     Shoulder Exercises: ROM/Strengthening   Rhythmic Stabilization, Supine Rt shoulder at 90 deg:  small circles CW/CCW, x pattern, 10 each x 3 reps     Modalities   Modalities --  pt declined     Manual Therapy   Manual Therapy Passive ROM;Soft tissue mobilization   Soft tissue mobilization pecs; upper trap; levator; teres    Passive ROM Rt shoulder:  scaption, flexion, horiz abd to neutral, IR/ ER (within protocol parameters).                 PT Education - 10/03/17 0959    Education provided Yes   Education Details scap stabilization in supine - (x's and o's)   Person(s) Educated Patient   Methods Explanation   Comprehension Verbalized understanding;Returned demonstration          PT  Short Term Goals - 10/03/17 1000      PT SHORT TERM GOAL #1   Title Patient independent in initial HEP 10/02/17   Time 4   Period Weeks   Status Achieved     PT SHORT TERM GOAL #2   Title begin and tolerate AROM of Rt shoulder ( 10/02/17)    Time 4   Period Weeks   Status On-going     PT SHORT TERM GOAL #3   Title report ability to fix her hair using bilat UE's ( 10/02/17)    Time 4   Period Weeks   Status On-going     PT SHORT TERM GOAL #4   Title improve FOTO =/< 47% limited ( 10/02/17)    Time 4   Period Weeks   Status On-going           PT Long Term Goals - 09/25/17 1111      PT LONG TERM GOAL #1   Title I with advanced HEP ( 11/13/17)   Time 10   Period Weeks   Status On-going     PT LONG TERM GOAL #2   Title AROM Rt shoulder equal or greater than AROM Lt shoulder 11/13/17   Time 10   Period Weeks   Status On-going     PT LONG TERM GOAL #3   Title 4+/5 to 5/5 strength Rt UE 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  Her Rt shoulder ROM is improving each visit.  Encouraged pt to remain within protocol to allow shoulder to heal properly.  Pt will move into next phase of rehab upon next visit.    Rehab Potential Excellent   PT Frequency 1x / week   PT Duration --  10 wks   PT Treatment/Interventions Moist Heat;Ultrasound;Therapeutic exercise;Taping;Vasopneumatic Device;Manual techniques;Neuromuscular re-education;Cryotherapy;Electrical Stimulation;Iontophoresis 4mg /ml Dexamethasone;Passive range of motion;Patient/family education;Scar mobilization   PT Next Visit Plan continue ROM and scap strengthening for Rt shoulder per protocol.  modalities PRN.    Consulted and Agree with Plan of Care Patient      Patient will benefit from skilled therapeutic intervention in order to improve the following deficits and impairments:  Decreased range of motion, Impaired UE functional use, Decreased strength, Postural dysfunction, Decreased scar mobility  Visit Diagnosis: Weakness generalized  Stiffness of right shoulder, not elsewhere classified  Other symptoms and signs involving the musculoskeletal system  Acute pain of right shoulder     Problem List Patient Active Problem List   Diagnosis Date Noted  . Iron deficiency anemia 05/31/2017  . Pulmonary nodules 12/26/2016  . Shoulder pain, right 12/25/2016  . Other chronic pain 11/03/2016  . Thyroid nodule 03/30/2016  . Solitary pulmonary nodule 03/30/2016  . Strain of gluteus medius  10/07/2015  . Depression with anxiety 09/29/2015  .  Hip pain, acute 08/31/2015  . Vitamin D insufficiency 04/08/2015  . Elevated vitamin B12 level 04/08/2015  . Hirsutism 01/28/2015  . Right knee DJD 12/17/2014  . Osteoarthritis of right knee 11/04/2014  . Pain in right hip 08/21/2014  . Hair loss 08/21/2014  . Hypothyroidism 07/24/2014  . Vasomotor flushing 03/05/2014  . Intrinsic asthma 12/17/2013  . Spinal stenosis of lumbar region 10/14/2013  . Coffee ground emesis 03/06/2013  . Disorder of bursae and tendons in shoulder region 03/01/2013  . Anal fissure 02/22/2013  . Diverticulosis of colon without hemorrhage 02/22/2013  . Hyperglycemia 02/01/2013  . Essential hypertension, benign 01/31/2013  . CAP (community acquired pneumonia) 01/31/2013  . Osteoporosis 01/31/2013  . HSV infection 01/31/2013   Kerin Perna, PTA 10/03/17 1:37 PM  [facility] [place], [name], [address] Phone: [phone]   Fax:  [phone]  Name: Margaret [name] MRN: [id number] Date of Birth: 1946-05-27

## 2017-10-03 NOTE — Patient Instructions (Signed)
Plan:  Call/message [place] with the name of a dermatologist and I'm happy to place a referral   Thyroid meds changed - if doing well and TSH levels ok can continue the medicine, if any problems come see me in the office to discuss

## 2017-10-04 ENCOUNTER — Encounter: Payer: Self-pay | Admitting: Osteopathic Medicine

## 2017-10-04 NOTE — Addendum Note (Signed)
Addended by: Maryla Morrow on: 10/04/2017 03:46 PM   Modules accepted: Orders

## 2017-10-09 ENCOUNTER — Encounter: Payer: Medicare HMO | Admitting: Physical Therapy

## 2017-10-10 DIAGNOSIS — J441 Chronic obstructive pulmonary disease with (acute) exacerbation: Secondary | ICD-10-CM | POA: Diagnosis not present

## 2017-10-10 DIAGNOSIS — I10 Essential (primary) hypertension: Secondary | ICD-10-CM | POA: Diagnosis not present

## 2017-10-11 DIAGNOSIS — L700 Acne vulgaris: Secondary | ICD-10-CM | POA: Diagnosis not present

## 2017-10-11 DIAGNOSIS — Z419 Encounter for procedure for purposes other than remedying health state, unspecified: Secondary | ICD-10-CM | POA: Diagnosis not present

## 2017-10-11 DIAGNOSIS — L578 Other skin changes due to chronic exposure to nonionizing radiation: Secondary | ICD-10-CM | POA: Diagnosis not present

## 2017-10-11 DIAGNOSIS — L820 Inflamed seborrheic keratosis: Secondary | ICD-10-CM | POA: Diagnosis not present

## 2017-10-11 DIAGNOSIS — L908 Other atrophic disorders of skin: Secondary | ICD-10-CM | POA: Diagnosis not present

## 2017-10-11 DIAGNOSIS — D485 Neoplasm of uncertain behavior of skin: Secondary | ICD-10-CM | POA: Diagnosis not present

## 2017-10-11 DIAGNOSIS — Z96611 Presence of right artificial shoulder joint: Secondary | ICD-10-CM | POA: Diagnosis not present

## 2017-10-13 ENCOUNTER — Encounter: Payer: Medicare HMO | Admitting: Physical Therapy

## 2017-10-13 DIAGNOSIS — M25552 Pain in left hip: Secondary | ICD-10-CM | POA: Diagnosis not present

## 2017-10-13 DIAGNOSIS — G894 Chronic pain syndrome: Secondary | ICD-10-CM | POA: Diagnosis not present

## 2017-10-13 DIAGNOSIS — I10 Essential (primary) hypertension: Secondary | ICD-10-CM | POA: Diagnosis not present

## 2017-10-13 DIAGNOSIS — M545 Low back pain: Secondary | ICD-10-CM | POA: Diagnosis not present

## 2017-10-13 DIAGNOSIS — M25551 Pain in right hip: Secondary | ICD-10-CM | POA: Diagnosis not present

## 2017-10-16 ENCOUNTER — Encounter: Payer: Medicare HMO | Admitting: Physical Therapy

## 2017-10-19 DIAGNOSIS — R911 Solitary pulmonary nodule: Secondary | ICD-10-CM | POA: Diagnosis not present

## 2017-10-19 DIAGNOSIS — I10 Essential (primary) hypertension: Secondary | ICD-10-CM | POA: Diagnosis not present

## 2017-10-19 DIAGNOSIS — J4541 Moderate persistent asthma with (acute) exacerbation: Secondary | ICD-10-CM | POA: Diagnosis not present

## 2017-10-23 ENCOUNTER — Ambulatory Visit: Payer: Medicare HMO | Admitting: Sports Medicine

## 2017-10-23 ENCOUNTER — Encounter: Payer: Self-pay | Admitting: Sports Medicine

## 2017-10-23 ENCOUNTER — Ambulatory Visit (INDEPENDENT_AMBULATORY_CARE_PROVIDER_SITE_OTHER): Payer: Medicare HMO

## 2017-10-23 ENCOUNTER — Ambulatory Visit: Payer: Medicare HMO | Admitting: Physical Therapy

## 2017-10-23 DIAGNOSIS — R531 Weakness: Secondary | ICD-10-CM

## 2017-10-23 DIAGNOSIS — M25562 Pain in left knee: Secondary | ICD-10-CM | POA: Diagnosis not present

## 2017-10-23 DIAGNOSIS — R29898 Other symptoms and signs involving the musculoskeletal system: Secondary | ICD-10-CM | POA: Diagnosis not present

## 2017-10-23 DIAGNOSIS — Z96652 Presence of left artificial knee joint: Secondary | ICD-10-CM | POA: Diagnosis not present

## 2017-10-23 DIAGNOSIS — M25561 Pain in right knee: Secondary | ICD-10-CM | POA: Diagnosis not present

## 2017-10-23 DIAGNOSIS — Z471 Aftercare following joint replacement surgery: Secondary | ICD-10-CM | POA: Diagnosis not present

## 2017-10-23 DIAGNOSIS — M25611 Stiffness of right shoulder, not elsewhere classified: Secondary | ICD-10-CM | POA: Diagnosis not present

## 2017-10-23 NOTE — Patient Instructions (Signed)
  Cane Exercise: Flexion    Lie on back, holding cane above chest. Keeping arms as straight as possible, lower cane toward floor beyond head. Hold __5_ seconds. Repeat _10___ times. Do _1-2___ sessions per day.    Cane Exercise: Abduction / Adduction    Hold cane palms down. Keeping back flat, move cane side to side over chest. Hold __3__ seconds each side. Repeat _10___ times. Do _2___ sessions per day.   Active Assistive Shoulder External Rotation   Stand with stick at waist level, right palm up, other palm down., push forearm out from body with hand palm down, and keep elbows bent. Hold. Side step and return to start position. Perform _10__ reps.   Strengthening: Resisted Internal Rotation   Hold tubing in left hand, elbow at side and forearm out. Rotate forearm in across body. Repeat __10__ times per set. Do __2__ sets per session. Do ____ sessions per day.  Strengthening: Resisted External Rotation   Hold tubing in right hand, elbow at side and forearm across body. Rotate forearm out. (rolled up towel by elbow) Repeat __10__ times per set. Do __2__ sets per session.  Extension (Resistive Band)    With band looped around hand and wrist, use elbow movements only. Using other arm as anchor, straighten elbow, pushing down. Hold __2-3__ seconds. Repeat _10___ times. Do _2___ sessions per session.   Elbow Flexion: Resisted    With tubing wrapped around left fist and other end secured under foot, curl arm up as far as possible. Repeat ____ times per set. Do _2-3___ sets per session. Do __1__ sessions per day.

## 2017-10-23 NOTE — Progress Notes (Addendum)
   Subjective:    I'm seeing this patient as a consultation for: Dr. Emeterio Reeve  CC: Left knee pain  HPI: This is a pleasant 71 year old female, she is approximately 14 years post total knee arthroplasty on the left, initially that she did well.  Over the past several months to years she has had increasing discomfort, pain, mild swelling in the left knee without redness, or constitutional symptoms, no trauma.  She has also noted increasing mechanical symptoms and clicking and catching.  Symptoms are moderate, worsening.  Past medical history, Surgical history, Family history not pertinant except as noted below, Social history, Allergies, and medications have been entered into the medical record, reviewed, and no changes needed.   Review of Systems: No headache, visual changes, nausea, vomiting, diarrhea, constipation, dizziness, abdominal pain, skin rash, fevers, chills, night sweats, weight loss, swollen lymph nodes, body aches, joint swelling, muscle aches, chest pain, shortness of breath, mood changes, visual or auditory hallucinations.   Objective:   General: Well Developed, well nourished, and in no acute distress.  Neuro:  Extra-ocular muscles intact, able to move all 4 extremities, sensation grossly intact.  Deep tendon reflexes tested were normal. Psych: Alert and oriented, mood congruent with affect. ENT:  Ears and nose appear unremarkable.  Hearing grossly normal. Neck: Unremarkable overall appearance, trachea midline.  No visible thyroid enlargement. Eyes: Conjunctivae and lids appear unremarkable.  Pupils equal and round. Skin: Warm and dry, no rashes noted.  Cardiovascular: Pulses palpable, no extremity edema. Left knee: Well-healed arthroplasty scar, no erythema, no swelling, no effusion, minimal tenderness at the medial joint line. Expected clicking with alternating application of varus and valgus stress but she also has some significant catching and clicking when going  through the range of motion. ROM normal in flexion and extension and lower leg rotation. Ligaments with solid consistent endpoints including ACL, PCL, LCL, MCL. Negative Mcmurray's and provocative meniscal tests. Non painful patellar compression. Patellar and quadriceps tendons unremarkable. Hamstring and quadriceps strength is normal.  Impression and Recommendations:   This case required medical decision making of moderate complexity.  H/O total knee replacement, left History of total knee arthroplasty on the left 14 years ago, over the past week has had some increasing pain, clicking, mechanical symptoms. Adding x-rays, reaction knee brace. If x-rays are unrevealing we will proceed with a three-phase bone scan to evaluate for loosening of the components. Luckily the knee was not warm or effused.  X-rays do show some anterior subluxation of the tibia, I am also concerned about some lucency in the anterior aspect of the tibial component. I would like her to touch base for surgical evaluation, she may need a bone scan. Patient requests [facility] here in [place].  ___________________________________________ Gwen Her. Dianah Field, M.D., ABFM., CAQSM. Primary Care and [place] Instructor of [place] [employer] [facility] [employer] [facility]

## 2017-10-23 NOTE — Therapy (Signed)
[place], [name], [address] Phone: [phone]   Fax:  [phone]  Physical Therapy Treatment  Patient Details  Name: Margaret [name] MRN: [id number] Date of Birth: 1946-08-11 Referring Provider: Dr. Theone Stanley    Encounter Date: 10/23/2017  PT End of Session - 10/23/17 1148    Visit Number  6    Number of Visits  16    Date for PT Re-Evaluation  11/13/17    PT Start Time  1107 pt arrived late    PT Stop Time  1145    PT Time Calculation (min)  38 min    Activity Tolerance  Patient tolerated treatment well    Behavior During Therapy  [facility] for tasks assessed/performed       Past Medical History:  Diagnosis Date  . Anemia   . Anxiety   . Arthritis   . Asthma   . Cancer ([place])   . COPD (chronic obstructive pulmonary disease) ([place])   . Emphysema of lung ([place])   . GERD (gastroesophageal reflux disease)   . Hypertension   . Osteoporosis   . Thyroid disease     Past Surgical History:  Procedure Laterality Date  . ABDOMINAL HYSTERECTOMY    . BREAST SURGERY    . CESAREAN SECTION    . JOINT REPLACEMENT     LT KNEE  . TOTAL KNEE ARTHROPLASTY  2006    There were no vitals filed for this visit.  Subjective Assessment - 10/23/17 1117    Subjective  "I can put up dishes, do my hair, etc".   Pt reports she is doing well sincel last visit. She is ready to learn more exercises to strengthen her arms.     Currently in Pain?  No/denies    Pain Score  0-No pain         [facility] PT Assessment - 10/23/17 0001      Assessment   Medical Diagnosis  Rt reverse total shoulder ( 08/18/17)     Referring Provider  Dr. Theone Stanley     Onset Date/Surgical Date  08/18/17    Hand Dominance  Right    Next MD Visit  11/07/17      AROM   Right/Left Shoulder  Right supine measurement    Right Shoulder Flexion  130 Degrees    Right Shoulder ABduction  110 Degrees      PROM   PROM Assessment Site   Shoulder    Right/Left Shoulder  Right    Right Shoulder Flexion  140 Degrees    Right Shoulder External Rotation  62 Degrees supine abd to ~80       [facility] Adult PT Treatment/Exercise - 10/23/17 0001      Exercises   Exercises  Elbow      Elbow Exercises   Elbow Flexion  Left;20 reps;Seated    Bar Weights/Barbell (Elbow Flexion)  2 lbs trial with yellow band     Elbow Extension  Strengthening;Left;Seated;Theraband    Theraband Level (Elbow Extension)  Level 2 (Red)      Shoulder Exercises: Supine   Horizontal ABduction  AAROM;Both;12 reps cane    Flexion  AAROM;Both;10 reps cane, 2 sets    Other Supine Exercises  10 reps thoracic lifts x 10 reps ; shoulder flex to 90 with small circles for scap stabilization x 3 reps      Shoulder Exercises: Sidelying   External Rotation  Strengthening;Right;10 reps;Weights;Limitations 2 sets  External Rotation Weight (lbs)  1    External Rotation Limitations  limited tolerance for Lt side lying      Shoulder Exercises: Standing   External Rotation  Strengthening;Right;10 reps;Theraband    Theraband Level (Shoulder External Rotation)  Level 1 (Yellow)    Internal Rotation  Strengthening;Right;10 reps;Theraband 2 sets    Theraband Level (Shoulder Internal Rotation)  Level 1 (Yellow)      Shoulder Exercises: Pulleys   Flexion  -- 10 reps, 10 sec hold    ABduction  -- 10 reps, 10 sec hold      Modalities   Modalities  -- pt declined; will do at home.       Manual Therapy   Manual therapy comments  I strip of sensitive skin Rock tape applied to Rt shoulder incision to help with scar management.              PT Education - 10/23/17 1255    Education provided  Yes    Education Details  reviewed therapy protocol from MD; HEP    Person(s) Educated  Patient    Methods  Explanation;Demonstration;Verbal cues;Handout    Comprehension  Verbalized understanding;Returned demonstration       PT Short Term Goals - 10/03/17 1000      PT  SHORT TERM GOAL #1   Title  Patient independent in initial HEP 10/02/17    Time  4    Period  Weeks    Status  Achieved      PT SHORT TERM GOAL #2   Title  begin and tolerate AROM of Rt shoulder ( 10/02/17)     Time  4    Period  Weeks    Status  On-going      PT SHORT TERM GOAL #3   Title  report ability to fix her hair using bilat UE's ( 10/02/17)     Time  4    Period  Weeks    Status  On-going      PT SHORT TERM GOAL #4   Title  improve FOTO =/< 47% limited ( 10/02/17)     Time  4    Period  Weeks    Status  On-going        PT Long Term Goals - 09/25/17 1111      PT LONG TERM GOAL #1   Title  I with advanced HEP ( 11/13/17)    Time  10    Period  Weeks    Status  On-going      PT LONG TERM GOAL #2   Title  AROM Rt shoulder equal or greater than AROM Lt shoulder 11/13/17    Time  10    Period  Weeks    Status  On-going      PT LONG TERM GOAL #3   Title  4+/5 to 5/5 strength Rt UE 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  Her Rt shoulder ROM has improved.  Reviewed rehab protocol from surgeon and encouraged pt to stay within those parameters. She had limited tolerance for Lt sidelying position for exercise, otherwise she tolerated new exercises well with min increase in pain/symptoms. Progressing well towards goals.     Rehab Potential  Excellent    PT Frequency  1x / week    PT Duration  -- 10 wks    PT Treatment/Interventions  Moist  Heat;Ultrasound;Therapeutic exercise;Taping;Vasopneumatic Device;Manual techniques;Neuromuscular re-education;Cryotherapy;Electrical Stimulation;Iontophoresis 4mg /ml Dexamethasone;Passive range of motion;Patient/family education;Scar mobilization    PT Next Visit Plan  continue ROM and strengthening for Rt shoulder per protocol.  FOTO for upcoming MD visit.     Consulted and Agree with Plan of Care  Patient       Patient will benefit from skilled therapeutic intervention in order to improve the following deficits and impairments:  Decreased range of motion, Impaired UE functional use, Decreased strength, Postural dysfunction, Decreased scar mobility  Visit Diagnosis: Weakness generalized  Stiffness of right shoulder, not elsewhere classified  Other symptoms and signs involving the musculoskeletal system     Problem List Patient Active Problem List   Diagnosis Date Noted  . Iron deficiency anemia 05/31/2017  . Pulmonary nodules 12/26/2016  . Shoulder pain, right 12/25/2016  . Other chronic pain 11/03/2016  . Thyroid nodule 03/30/2016  . Solitary pulmonary nodule 03/30/2016  . Strain of gluteus medius 10/07/2015  . Depression with anxiety 09/29/2015  . Hip pain, acute 08/31/2015  . Vitamin D insufficiency 04/08/2015  . Elevated vitamin B12 level 04/08/2015  . Hirsutism 01/28/2015  . Right knee DJD 12/17/2014  . Osteoarthritis of right knee 11/04/2014  . Pain in right hip 08/21/2014  . Hair loss 08/21/2014  . Hypothyroidism 07/24/2014  . Vasomotor flushing 03/05/2014  . Intrinsic asthma 12/17/2013  . Spinal stenosis of lumbar region 10/14/2013  . Coffee ground emesis 03/06/2013  . Disorder of bursae and tendons in shoulder region 03/01/2013  . Anal fissure 02/22/2013  . Diverticulosis of colon without hemorrhage 02/22/2013  . Hyperglycemia 02/01/2013  . Essential hypertension, benign 01/31/2013  . CAP (community acquired pneumonia) 01/31/2013  . Osteoporosis 01/31/2013   . HSV infection 01/31/2013   Kerin Perna, PTA 10/23/17 12:59 PM  [place], [name], [address] Phone: [phone]   Fax:  [phone]  Name: Margaret [name] MRN: [id number] Date of Birth: 11/24/1946

## 2017-10-23 NOTE — Assessment & Plan Note (Addendum)
History of total knee arthroplasty on the left 14 years ago, over the past week has had some increasing pain, clicking, mechanical symptoms. Adding x-rays, reaction knee brace. If x-rays are unrevealing we will proceed with a three-phase bone scan to evaluate for loosening of the components. Luckily the knee was not warm or effused.  X-rays do show some anterior subluxation of the tibia, I am also concerned about some lucency in the anterior aspect of the tibial component. I would like her to touch base for surgical evaluation, she may need a bone scan. Patient requests [facility] here in [place].

## 2017-10-24 NOTE — Addendum Note (Signed)
Addended by: Silverio Decamp on: 10/24/2017 03:03 PM   Modules accepted: Orders

## 2017-10-30 ENCOUNTER — Encounter: Payer: Self-pay | Admitting: Physical Therapy

## 2017-10-30 ENCOUNTER — Ambulatory Visit: Payer: Medicare HMO | Admitting: Physical Therapy

## 2017-10-30 DIAGNOSIS — R29898 Other symptoms and signs involving the musculoskeletal system: Secondary | ICD-10-CM

## 2017-10-30 DIAGNOSIS — M7632 Iliotibial band syndrome, left leg: Secondary | ICD-10-CM | POA: Diagnosis not present

## 2017-10-30 DIAGNOSIS — M25562 Pain in left knee: Secondary | ICD-10-CM | POA: Diagnosis not present

## 2017-10-30 DIAGNOSIS — R531 Weakness: Secondary | ICD-10-CM

## 2017-10-30 DIAGNOSIS — M25611 Stiffness of right shoulder, not elsewhere classified: Secondary | ICD-10-CM

## 2017-10-30 DIAGNOSIS — M25552 Pain in left hip: Secondary | ICD-10-CM | POA: Diagnosis not present

## 2017-10-30 NOTE — Therapy (Signed)
[place], [name], [address] Phone: [phone]   Fax:  [phone]  Physical Therapy Treatment  Patient Details  Name: Margaret [name] MRN: [id number] Date of Birth: [birth year] Referring Provider: Dr. Theone Stanley   Encounter Date: 10/30/2017  PT End of Session - 10/30/17 1126    Visit Number  7    Number of Visits  16    Date for PT Re-Evaluation  11/13/17    PT Start Time  1106    PT Stop Time  1145    PT Time Calculation (min)  39 min    Activity Tolerance  Patient tolerated treatment well    Behavior During Therapy  [facility] for tasks assessed/performed       Past Medical History:  Diagnosis Date  . Anemia   . Anxiety   . Arthritis   . Asthma   . Cancer ([place])   . COPD (chronic obstructive pulmonary disease) ([place])   . Emphysema of lung ([place])   . GERD (gastroesophageal reflux disease)   . Hypertension   . Osteoporosis   . Thyroid disease     Past Surgical History:  Procedure Laterality Date  . ABDOMINAL HYSTERECTOMY    . BREAST SURGERY    . CESAREAN SECTION    . JOINT REPLACEMENT     LT KNEE  . TOTAL KNEE ARTHROPLASTY  2006    There were no vitals filed for this visit.  Subjective Assessment - 10/30/17 1127    Subjective  Pt reports she was doing all sorts of cooking for the holiday.  Pt reports her husband handled the [place].  she continues to have difficulty doing her hair, but otherwise, feels her shoulder is doing well.     Currently in Pain?  No/denies    Pain Score  0-No pain         [facility] PT Assessment - 10/30/17 0001      Assessment   Medical Diagnosis  Rt reverse total shoulder ( 08/18/17)     Referring Provider  Dr. Theone Stanley    Onset Date/Surgical Date  08/18/17    Hand Dominance  Right    Next MD Visit  11/07/17      AROM   Right/Left Shoulder  Right    Right Shoulder Flexion  140 Degrees standing    Right Shoulder ABduction  120 Degrees standing    Right Shoulder External Rotation  77 Degrees supine, abd to 90 deg        [facility] Adult PT Treatment/Exercise - 10/30/17 0001      Elbow Exercises   Elbow Flexion  Left;20 reps;Seated    Bar Weights/Barbell (Elbow Flexion)  2 lbs;3 lbs trial with yellow band     Theraband Level (Elbow Extension)  Level 2 (Red) x10       Shoulder Exercises: Standing   External Rotation  Strengthening;Right;10 reps;Theraband 2 sets; VC for form.     Theraband Level (Shoulder External Rotation)  Level 1 (Yellow)    Internal Rotation  Strengthening;Right;10 reps;Theraband 2 sets    Theraband Level (Shoulder Internal Rotation)  Level 1 (Yellow)    Extension  Right;10 reps;Theraband    Theraband Level (Shoulder Extension)  Level 1 (Yellow) to neutral    Row  Strengthening;Right;10 reps;Theraband 2 sets. to neutral    Theraband Level (Shoulder Row)  Level 2 (Red)    Other Standing Exercises  full can with 1# to 90 deg (scaption) x 10, and  flexion to ~50 deg x 10 reps       Shoulder Exercises: Pulleys   Flexion  3 minutes 10 reps, 10 sec hold    ABduction  3 minutes 10 reps, 10 sec hold      Modalities   Modalities  -- pt declined; will do at home.       Manual Therapy   Soft tissue mobilization  pecs; upper trap; levator; teres; lat.                PT Short Term Goals - 10/30/17 1210      PT SHORT TERM GOAL #1   Title  Patient independent in initial HEP 10/02/17    Status  Achieved      PT SHORT TERM GOAL #2   Title  begin and tolerate AROM of Rt shoulder ( 10/02/17)     Time  4    Period  Weeks    Status  Partially Met no IR, Ext per protocol      PT SHORT TERM GOAL #3   Title  report ability to fix her hair using bilat UE's ( 10/02/17)     Time  4    Period  Weeks    Status  On-going able to with difficulty      PT SHORT TERM GOAL #4   Title  improve FOTO =/< 47% limited ( 10/02/17)     Time  4    Period  Weeks    Status  On-going        PT Long Term Goals - 10/30/17  1203      PT LONG TERM GOAL #1   Title  I with advanced HEP ( 11/13/17)    Time  10    Period  Weeks    Status  On-going      PT LONG TERM GOAL #2   Title  AROM Rt shoulder equal or greater than AROM Lt shoulder 11/13/17    Time  10    Period  Weeks    Status  Partially Met      PT LONG TERM GOAL #3   Title  4+/5 to 5/5 strength Rt UE to 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  Rt shoulder ROM gradually improves each visit. Again reminded pt to stay within surgeon's rehab protocol; no ext/IR behind back. She tolerated increased resistance for elbow flexion. She requires min tactile cues for avoiding compensatory movements.  She has partially met STG 2, LTG 2, LTG 4.  Progressing well towards remaining goals.     Rehab Potential  Excellent    PT Frequency  1x / week  PT Duration  -- 10 wks    PT Treatment/Interventions  Moist Heat;Ultrasound;Therapeutic exercise;Taping;Vasopneumatic Device;Manual techniques;Neuromuscular re-education;Cryotherapy;Electrical Stimulation;Iontophoresis 41m/ml Dexamethasone;Passive range of motion;Patient/family education;Scar mobilization    PT Next Visit Plan  continue ROM and strengthening for Rt shoulder per protocol.  FOTO for upcoming MD visit?    Consulted and Agree with Plan of Care  Patient       Patient will benefit from skilled therapeutic intervention in order to improve the following deficits and impairments:   Decreased range of motion, Impaired UE functional use, Decreased strength, Postural dysfunction, Decreased scar mobility  Visit Diagnosis: Weakness generalized  Stiffness of right shoulder, not elsewhere classified  Other symptoms and signs involving the musculoskeletal system     Problem List Patient Active Problem List   Diagnosis Date Noted  . H/O total knee replacement, left 10/23/2017  . Iron deficiency anemia 05/31/2017  . Pulmonary nodules 12/26/2016  . Shoulder pain, right 12/25/2016  . Other chronic pain 11/03/2016  . Thyroid nodule 03/30/2016  . Solitary pulmonary nodule 03/30/2016  . Strain of gluteus medius 10/07/2015  . Depression with anxiety 09/29/2015  . Hip pain, acute 08/31/2015  . Vitamin D insufficiency 04/08/2015  . Elevated vitamin B12 level 04/08/2015  . Hirsutism 01/28/2015  . Osteoarthritis of right knee 11/04/2014  . Pain in right hip 08/21/2014  . Hair loss 08/21/2014  . Hypothyroidism 07/24/2014  . Vasomotor flushing 03/05/2014  . Intrinsic asthma 12/17/2013  . Spinal stenosis of lumbar region 10/14/2013  . Coffee ground emesis 03/06/2013  . Disorder of bursae and tendons in shoulder region 03/01/2013  . Anal fissure 02/22/2013  . Diverticulosis of colon without hemorrhage 02/22/2013  . Hyperglycemia 02/01/2013  . Essential hypertension, benign 01/31/2013  . CAP (community acquired pneumonia) 01/31/2013  . Osteoporosis 01/31/2013  . HSV infection 01/31/2013   JKerin Perna PTA 10/30/17 12:18 PM  [address] [address]Phone: [phone]  Fax:  [phone] Name: Margaret HerneMRN: [id number] of Birth: [birth year]

## 2017-11-10 ENCOUNTER — Ambulatory Visit: Payer: Medicare HMO | Admitting: Physical Therapy

## 2017-11-10 DIAGNOSIS — R531 Weakness: Secondary | ICD-10-CM | POA: Diagnosis not present

## 2017-11-10 DIAGNOSIS — R29898 Other symptoms and signs involving the musculoskeletal system: Secondary | ICD-10-CM | POA: Diagnosis not present

## 2017-11-10 DIAGNOSIS — M25611 Stiffness of right shoulder, not elsewhere classified: Secondary | ICD-10-CM

## 2017-11-10 NOTE — Therapy (Signed)
[facility] [address] [place], [name], [address] Phone: [phone]   Fax:  [phone]  Physical Therapy Treatment  Patient Details  Name: Margaret [name] MRN: [id number] Date of Birth: [birth year] Referring Provider: Dr. Theone Stanley   Encounter Date: 11/10/2017  PT End of Session - 11/10/17 1107    Visit Number  8    Number of Visits  16    Date for PT Re-Evaluation  11/13/17    PT Start Time  1104    PT Stop Time  1143    PT Time Calculation (min)  39 min    Activity Tolerance  Patient tolerated treatment well    Behavior During Therapy  [facility] for tasks assessed/performed       Past Medical History:  Diagnosis Date  . Anemia   . Anxiety   . Arthritis   . Asthma   . Cancer ([place])   . COPD (chronic obstructive pulmonary disease) ()   . Emphysema of lung ([place])   . GERD (gastroesophageal reflux disease)   . Hypertension   . Osteoporosis   . Thyroid disease     Past Surgical History:  Procedure Laterality Date  . ABDOMINAL HYSTERECTOMY    . BREAST SURGERY    . CESAREAN SECTION    . JOINT REPLACEMENT     LT KNEE  . TOTAL KNEE ARTHROPLASTY  2006    There were no vitals filed for this visit.  Subjective Assessment - 11/10/17 1107    Subjective  Pt reports Dr. Creig Hines has released her from his care; she returns to him in 1 yr. "  I'm anxious to be able to unhook my bra behind my back.  I can now vacuum with my Rt arm and flush the commode with Rt arm"    Patient Stated Goals  get all her function back,     Currently in Pain?  No/denies    Pain Score  0-No pain         [facility] PT Assessment - 11/10/17 0001      Assessment   Medical Diagnosis  Rt reverse total shoulder ( 08/18/17)     Referring Provider  Dr. Theone Stanley    Onset Date/Surgical Date  08/18/17    Hand Dominance  Right      Observation/Other Assessments   Focus on Therapeutic Outcomes (FOTO)   41% limited, goal of 40%       AROM    Right/Left Shoulder  Right    Right Shoulder Flexion  148 Degrees pt supine    Right Shoulder Internal Rotation  -- n/t    Right Shoulder External Rotation  77 Degrees supine, abd to 90 deg      Strength   Right/Left Shoulder  Right    Right Shoulder Flexion  4+/5    Right Shoulder Extension  4+/5    Right Shoulder ABduction  4-/5    Right Shoulder Internal Rotation  3+/5    Right Shoulder External Rotation  3/5                  [facility] Adult PT Treatment/Exercise - 11/10/17 0001      Shoulder Exercises: Supine   Horizontal ABduction  Strengthening;Both;10 reps;Theraband    Theraband Level (Shoulder Horizontal ABduction)  Level 2 (Red)    External Rotation  Strengthening;Both;15 reps;Theraband    Theraband Level (Shoulder External Rotation)  Level 1 (Yellow)    Flexion  Strengthening;Both;12 reps;Theraband  Theraband Level (Shoulder Flexion)  Level 1 (Yellow) overhead pull, gentle within tolerance    Other Supine Exercises  sash with red band x 10 reps each arm, 2 sets      Shoulder Exercises: Standing   External Rotation  Strengthening;Both;10 reps    Theraband Level (Shoulder External Rotation)  Level 1 (Yellow)    Extension  Strengthening;Right;10 reps;Theraband    Theraband Level (Shoulder Extension)  Level 1 (Yellow)    Row  Strengthening;Right;10 reps;Theraband    Theraband Level (Shoulder Row)  Level 2 (Red)    Other Standing Exercises  reviewed orally exercises previously issued for HEP.     Other Standing Exercises  bicep curl with Rt elbow x       Shoulder Exercises: Pulleys   Flexion  3 minutes 10 reps, 10 sec hold    ABduction  3 minutes 10 reps, 10 sec hold      Shoulder Exercises: Stretch   Other Shoulder Stretches  IR AAROM with assistance of cane/ opp arm x 10; shoulder ext with cane AAROM x 10 reps       Modalities   Modalities  -- pt declined; will do at home.              PT Education - 11/10/17 1134    Education provided  Yes     Education Details  HEP - added additional strengthening exercises.     Person(s) Educated  Patient    Methods  Explanation;Handout;Demonstration    Comprehension  Verbalized understanding;Returned demonstration       PT Short Term Goals - 10/30/17 1210      PT SHORT TERM GOAL #1   Title  Patient independent in initial HEP 10/02/17    Status  Achieved      PT SHORT TERM GOAL #2   Title  begin and tolerate AROM of Rt shoulder ( 10/02/17)     Time  4    Period  Weeks    Status  Partially Met no IR, Ext per protocol      PT SHORT TERM GOAL #3   Title  report ability to fix her hair using bilat UE's ( 10/02/17)     Time  4    Period  Weeks    Status  Achieved able to with difficulty      PT SHORT TERM GOAL #4   Title  improve FOTO =/< 47% limited ( 10/02/17)     Time  4    Period  Weeks    Status Achieved       PT Long Term Goals - 11/10/17 1113      PT LONG TERM GOAL #1   Title  I with advanced HEP ( 11/13/17)    Time  10    Period  Weeks    Status  Achieved      PT LONG TERM GOAL #2   Title  AROM Rt shoulder equal or greater than AROM Lt shoulder 11/13/17    Time  10    Period  Weeks    Status  Partially Met      PT LONG TERM GOAL #3   Title  4+/5 to 5/5 strength Rt UE to 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            Plan - 11/10/17 1514    Clinical Impression Statement  [name] is now 1 day shy of 12 wks s/p reverse total shoulder surgery.  We introduced shoulder ext and IR per rehab  protocol.  Pt tolerated new exercises with resistance well, without increase in symptoms other than fatigue.  Her Rt shoulder has improved in ROM and strength.  Pt has partially met her goals.  Pt agreeable d/c and continue working on HEP on own.      Rehab Potential  Excellent    PT Frequency  1x / week    PT Treatment/Interventions  Moist Heat;Ultrasound;Therapeutic exercise;Taping;Vasopneumatic Device;Manual techniques;Neuromuscular re-education;Cryotherapy;Electrical Stimulation;Iontophoresis 41m/ml Dexamethasone;Passive range of motion;Patient/family education;Scar mobilization    PT Next Visit Plan  spoke to supervising PT; will d/c to HEP at this time.         Patient will benefit from skilled therapeutic intervention in order to improve the following deficits and impairments:  Decreased range of motion, Impaired UE functional use, Decreased strength, Postural dysfunction, Decreased scar mobility  Visit Diagnosis: Weakness generalized  Stiffness of right shoulder, not elsewhere classified  Other symptoms and signs involving the musculoskeletal system     Problem List Patient Active Problem List   Diagnosis Date Noted  . H/O total knee replacement, left 10/23/2017  . Iron deficiency anemia 05/31/2017  . Pulmonary nodules 12/26/2016  . Shoulder pain, right 12/25/2016  . Other chronic pain 11/03/2016  . Thyroid nodule 03/30/2016  . Solitary pulmonary nodule 03/30/2016  . Strain of gluteus medius 10/07/2015  . Depression with anxiety 09/29/2015  . Hip pain, acute 08/31/2015  . Vitamin D insufficiency 04/08/2015  . Elevated vitamin B12 level 04/08/2015  . Hirsutism 01/28/2015  . Osteoarthritis of right knee 11/04/2014  . Pain in right hip 08/21/2014  . Hair loss 08/21/2014  . Hypothyroidism 07/24/2014  . Vasomotor flushing 03/05/2014  . Intrinsic asthma 12/17/2013  . Spinal stenosis of lumbar region 10/14/2013  . Coffee ground emesis 03/06/2013  . Disorder of bursae and  tendons in shoulder region 03/01/2013  . Anal fissure 02/22/2013  . Diverticulosis of colon without hemorrhage 02/22/2013  . Hyperglycemia 02/01/2013  . Essential hypertension, benign 01/31/2013  . CAP (community acquired pneumonia) 01/31/2013  . Osteoporosis 01/31/2013  . HSV infection 01/31/2013   JKerin Perna PTA 11/10/17 3:19 PM  [address] [place] [place] [address]Phone: [phone]  Fax:  [phone] Name: MDannetta LekasMRN: [id number] of Birth: [birth year]  PHYSICAL THERAPY DISCHARGE SUMMARY  Visits from Start of Care: 8 for this episode of care.   Current functional level related to goals / functional outcomes: See above   Remaining deficits: Unable to hook her bra behind her back   Education / Equipment: HEP    Plan: Patient agrees to discharge.  Patient goals were partially met. Patient is being discharged due to financial reasons.  ?????   Pt  advised that is she is still unable to hook her bra in the new year she can request another order and her insurance will start over and therapy will be covered.  Jeral Pinch, PT 11/10/17 4:02 PM

## 2017-11-10 NOTE — Patient Instructions (Signed)
  Resistive Band Rowing   With resistive band anchored in door, grasp both ends. Keeping elbows bent, pull back, squeezing shoulder blades together. Hold _3-5___ seconds. Repeat _10-30___ times. Do __1__ sessions per day.  RED band.    Strengthening: Resisted Extension   Hold tubing with both hands, arms forward. Pull arms back, elbow straight. Repeat _10-30___ times per set. Do ____ sets per session. Do _1___ sessions per day.  YELLOW band  Over Head Pull: Narrow Grip        On back, knees bent, feet flat, band across thighs, elbows straight but relaxed. Pull hands apart (start). Keeping elbows straight, bring arms up and over head, hands toward floor. Keep pull steady on band. Hold momentarily. Return slowly, keeping pull steady, back to start. Repeat __10_ times. Band color ___yellow___   Side Pull: Double Arm   On back, knees bent, feet flat. Arms perpendicular to body, shoulder level, elbows straight but relaxed. Pull arms out to sides, elbows straight. Resistance band comes across collarbones, hands toward floor. Hold momentarily. Slowly return to starting position. Repeat ___ times. Band color _____   Sash   On back, knees bent, feet flat, left hand on left hip, right hand above left. Pull right arm DIAGONALLY (hip to shoulder) across chest. Bring right arm along head toward floor. Hold momentarily. Slowly return to starting position. Repeat _10__ times. Do with left arm. Band color _red___   Shoulder Rotation: Double Arm   On back, knees bent, feet flat, elbows tucked at sides, bent 90, hands palms up. Pull hands apart and down toward floor, keeping elbows near sides. Hold momentarily. Slowly return to starting position. Repeat _10__ times. Band color __yellow____

## 2017-11-14 ENCOUNTER — Encounter: Payer: Self-pay | Admitting: Osteopathic Medicine

## 2017-11-20 ENCOUNTER — Ambulatory Visit: Payer: Medicare HMO | Admitting: Sports Medicine

## 2017-11-22 DIAGNOSIS — E039 Hypothyroidism, unspecified: Secondary | ICD-10-CM | POA: Diagnosis not present

## 2017-11-22 LAB — TSH: TSH: 2.55 mIU/L (ref 0.40–4.50)

## 2017-11-23 ENCOUNTER — Other Ambulatory Visit: Payer: Self-pay | Admitting: Osteopathic Medicine

## 2017-11-23 MED ORDER — THYROID 30 MG PO TABS: 30.0000 mg | ORAL_TABLET | Freq: Every day | ORAL | 3 refills | Status: DC

## 2017-12-02 ENCOUNTER — Other Ambulatory Visit: Payer: Self-pay | Admitting: Osteopathic Medicine

## 2017-12-02 DIAGNOSIS — I10 Essential (primary) hypertension: Secondary | ICD-10-CM

## 2017-12-13 ENCOUNTER — Encounter: Payer: Self-pay | Admitting: Physician Assistant

## 2017-12-13 ENCOUNTER — Ambulatory Visit (INDEPENDENT_AMBULATORY_CARE_PROVIDER_SITE_OTHER): Payer: Medicare HMO | Admitting: Physician Assistant

## 2017-12-13 VITALS — BP 137/86 | HR 74 | Temp 97.6°F | Resp 15 | Wt 129.0 lb

## 2017-12-13 DIAGNOSIS — J040 Acute laryngitis: Secondary | ICD-10-CM

## 2017-12-13 DIAGNOSIS — J441 Chronic obstructive pulmonary disease with (acute) exacerbation: Secondary | ICD-10-CM

## 2017-12-13 MED ORDER — PREDNISONE 20 MG PO TABS: 40.0000 mg | ORAL_TABLET | Freq: Every day | ORAL | 0 refills | Status: AC

## 2017-12-13 MED ORDER — AZITHROMYCIN 250 MG PO TABS: ORAL_TABLET | ORAL | 0 refills | Status: DC

## 2017-12-13 NOTE — Progress Notes (Signed)
HPI:                                                                Margaret [name] is a 72 y.o. female who presents to [facility] today for cough and hoarseness  Cough  This is a new problem. The current episode started 1 to 4 weeks ago (x 2 weeks). The problem has been unchanged. The problem occurs every few minutes. The cough is productive of purulent sputum. Associated symptoms include nasal congestion (sinus pressure), shortness of breath and wheezing. Pertinent negatives include no chest pain, fever or sore throat. Associated symptoms comments: +fatigue/malaise. Nothing aggravates the symptoms. Risk factors for lung disease include smoking/tobacco exposure. She has tried a beta-agonist inhaler (TheraFlu) for the symptoms. The treatment provided no relief. Her past medical history is significant for asthma, COPD, emphysema and pneumonia.     Past Medical History:  Diagnosis Date  . Anemia   . Anxiety   . Arthritis   . Asthma   . Cancer ([place])   . COPD (chronic obstructive pulmonary disease) ([place])   . Emphysema of lung ([place])   . GERD (gastroesophageal reflux disease)   . Hypertension   . Osteoporosis   . Thyroid disease    Past Surgical History:  Procedure Laterality Date  . ABDOMINAL HYSTERECTOMY    . BREAST SURGERY    . CESAREAN SECTION    . JOINT REPLACEMENT     LT KNEE  . TOTAL KNEE ARTHROPLASTY  2006   Social History   Tobacco Use  . Smoking status: Former Smoker    Packs/day: 1.50    Years: 40.00    Pack years: 60.00    Last attempt to quit: 02/01/2008    Years since quitting: 9.8  . Smokeless tobacco: Never Used  Substance Use Topics  . Alcohol use: Yes    Comment: SOCIALLY   family history includes Alcohol abuse in her father; Alcohol abuse (age of onset: 34) in her brother; COPD in her sister; Cancer (age of onset: 1) in her maternal grandmother; Cirrhosis in her brother; Emphysema (age of onset: 30) in her  mother; Stroke in her other; Stroke (age of onset: 15) in her maternal grandfather.  ROS: negative except as noted in the HPI  Medications: Current Outpatient Medications  Medication Sig Dispense Refill  . albuterol (PROVENTIL HFA;VENTOLIN HFA) 108 (90 Base) MCG/ACT inhaler Inhale 1-2 puffs into the lungs every 4 (four) hours as needed for wheezing. 1 Inhaler 0  . amLODipine (NORVASC) 10 MG tablet TAKE 1 TABLET BY MOUTH EVERY DAY 90 tablet 0  . betamethasone dipropionate (DIPROLENE) 0.05 % cream Apply topically 2 (two) times daily. To affected area(s) as needed 45 g 0  . ferrous sulfate 325 (65 FE) MG EC tablet Take 1 tablet (325 mg total) by mouth 2 (two) times daily with a meal. 180 tablet 1  . losartan (COZAAR) 100 MG tablet TAKE 1 TABLET BY MOUTH EVERY DAY 90 tablet 1  . meloxicam (MOBIC) 15 MG tablet Take 15 mg by mouth daily.    . montelukast (SINGULAIR) 10 MG tablet Take 10 mg by mouth daily.  2  . morphine (MSIR) 15 MG tablet Take 15 mg by mouth every 4 (four)  hours as needed for severe pain.    [name] thyroid (ARMOUR THYROID) 30 MG tablet Take 1 tablet (30 mg total) by mouth daily before breakfast. Repeat labs when these pills are running low 90 tablet 3  . Tiotropium Bromide-Olodaterol 2.5-2.5 MCG/ACT AERS Inhale 2 puffs into the lungs daily.    [name] azithromycin (ZITHROMAX Z-PAK) 250 MG tablet Take 2 tablets (500 mg) on  Day 1,  followed by 1 tablet (250 mg) once daily on Days 2 through 5. 6 tablet 0  . predniSONE (DELTASONE) 20 MG tablet Take 2 tablets (40 mg total) by mouth daily with breakfast for 5 days. 10 tablet 0   No current facility-administered medications for this visit.    Allergies  Allergen Reactions  . Clarithromycin Other (See Comments)    THRUSH  . Tetracyclines & Related     Other reaction(s): Other SERVER INDIGESTION  . Evista [Raloxifene]     Back pain  . Floxin [Ofloxacin] Hives  . Neosporin [Neomycin-Polymyxin-Gramicidin] Hives  . Other     ANTIBIOTIC OINT -  UNSURE OF NAME        Objective:  BP 137/86   Pulse 74   Temp 97.6 F (36.4 C) (Oral)   Wt 129 lb (58.5 kg)   SpO2 95%   BMI 25.19 kg/m  Gen:  alert, not ill-appearing, no distress, appropriate for age 61: head normocephalic without obvious abnormality, conjunctiva and cornea clear, nasal mucosa edematous, oropharynx clear, moist mucous membranes, neck supple, no adenopathy, trachea midline Pulm: Normal work of breathing, voice is hoarse, diffuse wheezes CV: Normal rate, regular rhythm, s1 and s2 distinct, no murmurs, clicks or rubs  Neuro: alert and oriented x 3, no tremor MSK: extremities atraumatic, normal gait and station Skin: intact, no rashes on exposed skin, no jaundice, no cyanosis  No results found for this or any previous visit (from the past 72 hour(s)). No results found.    Assessment and Plan: 72 y.o. female with   1. COPD with acute exacerbation (HCC) - SpO2 95% on RA at rest, normal effort, mild relative hypoxia, no acute respiratory distress. Macrolide and Prednisone for 5 days. Contact pulmonologist for inhaler refills and follow-up - azithromycin (ZITHROMAX Z-PAK) 250 MG tablet; Take 2 tablets (500 mg) on  Day 1,  followed by 1 tablet (250 mg) once daily on Days 2 through 5.  Dispense: 6 tablet; Refill: 0 - predniSONE (DELTASONE) 20 MG tablet; Take 2 tablets (40 mg total) by mouth daily with breakfast for 5 days.  Dispense: 10 tablet; Refill: 0  2. Laryngitis, acute - drink at least 64 ounces of water - humidified air - vocal rest   Patient education and anticipatory guidance given Patient agrees with treatment plan Follow-up as needed if symptoms worsen or fail to improve  Darlyne Russian PA-C

## 2017-12-13 NOTE — Patient Instructions (Addendum)
For Laryngitis: - drink at least 64 ounces of water - humidified air - vocal rest  Contact your pulmonologist and let them know you were treated for a COPD exacerbation  Chronic Obstructive Pulmonary Disease Exacerbation Chronic obstructive pulmonary disease (COPD) is a common lung problem. In COPD, the flow of air from the lungs is limited. COPD exacerbations are times that breathing gets worse and you need extra treatment. Without treatment they can be life threatening. If they happen often, your lungs can become more damaged. If your COPD gets worse, your doctor may treat you with:  Medicines.  Oxygen.  Different ways to clear your airway, such as using a mask.  Follow these instructions at home:  Do not smoke.  Avoid tobacco smoke and other things that bother your lungs.  If given, take your antibiotic medicine as told. Finish the medicine even if you start to feel better.  Only take medicines as told by your doctor.  Drink enough fluids to keep your pee (urine) clear or pale yellow (unless your doctor has told you not to).  Use a cool mist machine (vaporizer).  If you use oxygen or a machine that turns liquid medicine into a mist (nebulizer), continue to use them as told.  Keep up with shots (vaccinations) as told by your doctor.  Exercise regularly.  Eat healthy foods.  Keep all doctor visits as told. Get help right away if:  You are very short of breath and it gets worse.  You have trouble talking.  You have bad chest pain.  You have blood in your spit (sputum).  You have a fever.  You keep throwing up (vomiting).  You feel weak, or you pass out (faint).  You feel confused.  You keep getting worse. This information is not intended to replace advice given to you by your health care provider. Make sure you discuss any questions you have with your health care provider. Document Released: 11/10/2011 Document Revised: 04/28/2016 Document Reviewed:  07/26/2013 Elsevier Interactive Patient Education  2017 [employer].

## 2017-12-21 ENCOUNTER — Other Ambulatory Visit: Payer: Self-pay | Admitting: Osteopathic Medicine

## 2017-12-21 DIAGNOSIS — I10 Essential (primary) hypertension: Secondary | ICD-10-CM

## 2017-12-26 ENCOUNTER — Encounter: Payer: Self-pay | Admitting: Osteopathic Medicine

## 2017-12-26 DIAGNOSIS — G8929 Other chronic pain: Secondary | ICD-10-CM

## 2017-12-26 DIAGNOSIS — M545 Low back pain: Principal | ICD-10-CM

## 2017-12-28 ENCOUNTER — Encounter: Payer: Self-pay | Admitting: Osteopathic Medicine

## 2018-01-07 ENCOUNTER — Encounter: Payer: Self-pay | Admitting: Osteopathic Medicine

## 2018-01-08 MED ORDER — FLUOXETINE HCL 20 MG PO CAPS: 20.0000 mg | ORAL_CAPSULE | Freq: Every day | ORAL | 1 refills | Status: DC

## 2018-01-09 DIAGNOSIS — M545 Low back pain: Secondary | ICD-10-CM | POA: Diagnosis not present

## 2018-01-09 DIAGNOSIS — M5432 Sciatica, left side: Secondary | ICD-10-CM | POA: Diagnosis not present

## 2018-01-09 DIAGNOSIS — M4726 Other spondylosis with radiculopathy, lumbar region: Secondary | ICD-10-CM | POA: Diagnosis not present

## 2018-01-09 DIAGNOSIS — M5431 Sciatica, right side: Secondary | ICD-10-CM | POA: Diagnosis not present

## 2018-01-10 ENCOUNTER — Encounter: Payer: Self-pay | Admitting: Osteopathic Medicine

## 2018-01-10 DIAGNOSIS — M545 Low back pain: Secondary | ICD-10-CM | POA: Diagnosis not present

## 2018-01-10 DIAGNOSIS — G894 Chronic pain syndrome: Secondary | ICD-10-CM | POA: Diagnosis not present

## 2018-01-10 DIAGNOSIS — I10 Essential (primary) hypertension: Secondary | ICD-10-CM | POA: Diagnosis not present

## 2018-01-10 DIAGNOSIS — G8929 Other chronic pain: Secondary | ICD-10-CM | POA: Diagnosis not present

## 2018-01-15 DIAGNOSIS — R911 Solitary pulmonary nodule: Secondary | ICD-10-CM | POA: Diagnosis not present

## 2018-01-15 DIAGNOSIS — I10 Essential (primary) hypertension: Secondary | ICD-10-CM | POA: Diagnosis not present

## 2018-01-15 DIAGNOSIS — J4541 Moderate persistent asthma with (acute) exacerbation: Secondary | ICD-10-CM | POA: Diagnosis not present

## 2018-01-16 DIAGNOSIS — M545 Low back pain: Secondary | ICD-10-CM | POA: Diagnosis not present

## 2018-01-16 DIAGNOSIS — M4726 Other spondylosis with radiculopathy, lumbar region: Secondary | ICD-10-CM | POA: Diagnosis not present

## 2018-01-23 ENCOUNTER — Other Ambulatory Visit: Payer: Self-pay | Admitting: Osteopathic Medicine

## 2018-01-23 DIAGNOSIS — M961 Postlaminectomy syndrome, not elsewhere classified: Secondary | ICD-10-CM | POA: Diagnosis not present

## 2018-01-23 DIAGNOSIS — M5431 Sciatica, right side: Secondary | ICD-10-CM | POA: Diagnosis not present

## 2018-01-23 DIAGNOSIS — M4726 Other spondylosis with radiculopathy, lumbar region: Secondary | ICD-10-CM | POA: Diagnosis not present

## 2018-01-23 DIAGNOSIS — M5432 Sciatica, left side: Secondary | ICD-10-CM | POA: Diagnosis not present

## 2018-01-23 DIAGNOSIS — I10 Essential (primary) hypertension: Secondary | ICD-10-CM

## 2018-01-29 DIAGNOSIS — M859 Disorder of bone density and structure, unspecified: Secondary | ICD-10-CM | POA: Diagnosis not present

## 2018-01-29 DIAGNOSIS — M8589 Other specified disorders of bone density and structure, multiple sites: Secondary | ICD-10-CM | POA: Diagnosis not present

## 2018-01-29 DIAGNOSIS — M47814 Spondylosis without myelopathy or radiculopathy, thoracic region: Secondary | ICD-10-CM | POA: Diagnosis not present

## 2018-01-29 DIAGNOSIS — Z780 Asymptomatic menopausal state: Secondary | ICD-10-CM | POA: Diagnosis not present

## 2018-01-29 DIAGNOSIS — Z13820 Encounter for screening for osteoporosis: Secondary | ICD-10-CM | POA: Diagnosis not present

## 2018-01-29 DIAGNOSIS — M546 Pain in thoracic spine: Secondary | ICD-10-CM | POA: Diagnosis not present

## 2018-01-29 DIAGNOSIS — M47812 Spondylosis without myelopathy or radiculopathy, cervical region: Secondary | ICD-10-CM | POA: Diagnosis not present

## 2018-02-05 ENCOUNTER — Other Ambulatory Visit: Payer: Self-pay | Admitting: Osteopathic Medicine

## 2018-02-05 DIAGNOSIS — E041 Nontoxic single thyroid nodule: Secondary | ICD-10-CM

## 2018-02-14 DIAGNOSIS — M4316 Spondylolisthesis, lumbar region: Secondary | ICD-10-CM | POA: Diagnosis not present

## 2018-02-14 DIAGNOSIS — I10 Essential (primary) hypertension: Secondary | ICD-10-CM | POA: Diagnosis not present

## 2018-02-20 DIAGNOSIS — H10023 Other mucopurulent conjunctivitis, bilateral: Secondary | ICD-10-CM | POA: Diagnosis not present

## 2018-02-24 ENCOUNTER — Other Ambulatory Visit: Payer: Self-pay | Admitting: Osteopathic Medicine

## 2018-02-24 DIAGNOSIS — I10 Essential (primary) hypertension: Secondary | ICD-10-CM

## 2018-02-26 ENCOUNTER — Ambulatory Visit (INDEPENDENT_AMBULATORY_CARE_PROVIDER_SITE_OTHER): Payer: Medicare HMO

## 2018-02-26 ENCOUNTER — Ambulatory Visit (INDEPENDENT_AMBULATORY_CARE_PROVIDER_SITE_OTHER): Payer: Medicare HMO | Admitting: Family Medicine

## 2018-02-26 ENCOUNTER — Encounter: Payer: Self-pay | Admitting: Family Medicine

## 2018-02-26 VITALS — BP 117/77 | HR 87 | Ht 61.0 in | Wt 125.0 lb

## 2018-02-26 DIAGNOSIS — J45909 Unspecified asthma, uncomplicated: Secondary | ICD-10-CM

## 2018-02-26 DIAGNOSIS — E039 Hypothyroidism, unspecified: Secondary | ICD-10-CM | POA: Diagnosis not present

## 2018-02-26 DIAGNOSIS — R059 Cough, unspecified: Secondary | ICD-10-CM

## 2018-02-26 DIAGNOSIS — R5383 Other fatigue: Secondary | ICD-10-CM

## 2018-02-26 DIAGNOSIS — I10 Essential (primary) hypertension: Secondary | ICD-10-CM | POA: Diagnosis not present

## 2018-02-26 DIAGNOSIS — R05 Cough: Secondary | ICD-10-CM

## 2018-02-26 DIAGNOSIS — D509 Iron deficiency anemia, unspecified: Secondary | ICD-10-CM | POA: Diagnosis not present

## 2018-02-26 NOTE — Patient Instructions (Signed)
Thank you for coming in today. Try taking 1/2 pills of Morphine daily for a few days then 1/4 pill for a days then quit.   Get labs and xray today.   Recheck later this week with me or Dr Sheppard Coil.   Return sooner if worsening.    Opioid Withdrawal Opioids are powerful substances that relieve pain. Opioids include illegal drugs, such as heroin, as well as prescription pain medicines, such as codeine, morphine, hydrocodone, oxycodone, and fentanyl. Opioid withdrawal is a group of symptoms that can happen if you have been taking opioids for a long time and suddenly stop. What are the causes? This condition is caused by taking opioids for weeks and then doing any of the following:  Stopping use.  Rapidly reducing use.  Taking a medicine to block their effect.  What increases the risk? This condition is more likely to develop in:  People who take opioids incorrectly.  People who take opioids for a long period of time.  What are the signs or symptoms? Symptoms of this condition can be physical or mental. Physical symptoms include:  Nausea and vomiting.  Muscle aches or spasms.  Watery eyes and runny nose.  Widening of the dark centers of the eyes (dilated pupils).  Hair standing on end.  Fever and sweating.  Intestinal cramping and diarrhea.  Increased blood pressure and fast pulse.  Mental symptoms include:  Depression.  Anxiety.  Restlessness and irritability.  Trouble sleeping.  When symptoms start and how long they last depends on if you have been taking an opioid that works fast and then loses its effect quickly (short acting-opioid), an opioid that works for a longer period of time (long-acting opioid), or a drug that blocks the effects of opioids.  If you have been taking a short-acting opioid, such as heroin and oxycodone, symptoms occur within hours of stopping or reducing the amount you take. The worst symptoms (peak withdrawal) occur in 24-48 hours.  Symptoms should subside in 3-5 days.  If you have been taking a long-acting opioid, such as methadone, symptoms can occur within 30 hours of stopping or reducing the amount you take and can continue for up to 10 days.  If you are taking a drug that blocks the effects of opioids, such as naltrexone or naloxone, symptoms begin within minutes.  How is this diagnosed? This condition is diagnosed based on:  Your symptoms.  Your medical history.  Your history of drug and alcohol use.  Which medicines you have been taking.  Your health care provider may:  Perform a physical exam.  Order tests.  Ask that you see a mental health professional.  How is this treated? Treatment for this condition is usually provided by mental health professionals with training in substance use disorders (addiction specialists). Treatment may involve:  Counseling. This treatment is also called talk therapy. It is provided by substance use treatment counselors.  Support groups. Support groups are run by people who have quit using opioids. They provide emotional support, advice, and guidance.  Medicine. Some medicines can help to lessen certain withdrawal symptoms. Sometimes an opioid is prescribed to replace the opioid that you have been taking. You may be asked to take less and less of this opioid over time to lessen or prevent withdrawal symptoms.  Follow these instructions at home:  Take over-the-counter and prescription medicines only as told by your health care provider.  Check with your health care provider before starting any new medicines.  Keep all  follow-up visits as told by your health care provider. This is important. Contact a health care provider if:  You are not able to take your medicines as told.  Your symptoms get worse.  You take an opioid after stopping use, or you take more of an opioid than you have been. Get help right away if:  You have a seizure.  You lose  consciousness.  You have serious thoughts about hurting yourself or others. If you ever feel like you may hurt yourself or others, or have thoughts about taking your own life, get help right away. You can go to your nearest emergency department or call:  Your local emergency services (911 in the [place]  A suicide crisis helpline, such as the [place] at 819-878-8296. This is open 24 hours a day.  This information is not intended to replace advice given to you by your health care provider. Make sure you discuss any questions you have with your health care provider. Document Released: 11/24/2003 Document Revised: 09/02/2016 Document Reviewed: 09/02/2016 Elsevier Interactive Patient Education  [employer].

## 2018-02-26 NOTE — Progress Notes (Signed)
Margaret [name] is a 72 y.o. female who presents to [name] today for fatigue headache nausea.  Symptoms present for 3 days and associated with additionally feeling cold.  She denies any vomiting or diarrhea.  She denies any breathing.  She notes a mild cough.  In the past she is felt ill and been diagnosed with pneumonia and wants to make sure she does not have a pneumonia today.  She notes her only medication changes that she discontinued her immediate release morphine 15 mg twice daily about 3 days ago.  She is try to get off of this medication.  Additionally she started taking Lyrica about 2 weeks ago.  She denies chest pain or palpitations.  She has not tried any medications yet for her symptoms.  She is not sure about sick contacts.   Past Medical History:  Diagnosis Date  . Anemia   . Anxiety   . Arthritis   . Asthma   . Cancer ([place])   . COPD (chronic obstructive pulmonary disease) ([place])   . Emphysema of lung ([place])   . GERD (gastroesophageal reflux disease)   . Hypertension   . Osteoporosis   . Thyroid disease    Past Surgical History:  Procedure Laterality Date  . ABDOMINAL HYSTERECTOMY    . BREAST SURGERY    . CESAREAN SECTION    . JOINT REPLACEMENT     LT KNEE  . TOTAL KNEE ARTHROPLASTY  2006   Social History   Tobacco Use  . Smoking status: Former Smoker    Packs/day: 1.50    Years: 40.00    Pack years: 60.00    Last attempt to quit: 02/01/2008    Years since quitting: 10.0  . Smokeless tobacco: Never Used  Substance Use Topics  . Alcohol use: Yes    Comment: SOCIALLY   family history includes Alcohol abuse in her father; Alcohol abuse (age of onset: 69) in her brother; COPD in her sister; Cancer (age of onset: 6) in her maternal grandmother; Cirrhosis in her brother; Emphysema (age of onset: 52) in her mother; Stroke in her other; Stroke  (age of onset: 60) in her maternal grandfather.  ROS as above:  Medications: Current Outpatient Medications  Medication Sig Dispense Refill  . albuterol (PROVENTIL HFA;VENTOLIN HFA) 108 (90 Base) MCG/ACT inhaler Inhale 1-2 puffs into the lungs every 4 (four) hours as needed for wheezing. 1 Inhaler 0  . amLODipine (NORVASC) 10 MG tablet TAKE 1 TABLET BY MOUTH EVERY DAY 90 tablet 0  . betamethasone dipropionate (DIPROLENE) 0.05 % cream daily.    [name] FLUoxetine (PROZAC) 20 MG capsule Take 1 capsule (20 mg total) by mouth daily. 90 capsule 1  . losartan (COZAAR) 100 MG tablet PLEASE SEE ATTACHED FOR DETAILED DIRECTIONS 30 tablet 0  . montelukast (SINGULAIR) 10 MG tablet Take 10 mg by mouth daily.  2  . morphine (MSIR) 15 MG tablet Take 15 mg by mouth every 4 (four) hours as needed for severe pain.    . pregabalin (LYRICA) 75 MG capsule Take 1 capsule by mouth 3 (three) times daily.    [name] thyroid (ARMOUR THYROID) 30 MG tablet Take 1 tablet (30 mg total) by mouth daily before breakfast. Repeat labs when these pills are running low 90 tablet 3  . Tiotropium Bromide-Olodaterol 2.5-2.5 MCG/ACT AERS Inhale 2 puffs into the lungs daily.    [name] FLOVENT HFA 220 MCG/ACT inhaler     .  tobramycin (TOBREX) 0.3 % ophthalmic solution      No current facility-administered medications for this visit.    Allergies  Allergen Reactions  . Clarithromycin Other (See Comments)    THRUSH  . Tetracyclines & Related     Other reaction(s): Other SERVER INDIGESTION  . Evista [Raloxifene]     Back pain  . Floxin [Ofloxacin] Hives  . Neosporin [Neomycin-Polymyxin-Gramicidin] Hives  . Other     ANTIBIOTIC OINT - UNSURE OF NAME     Health Maintenance Health Maintenance  Topic Date Due  . MAMMOGRAM  04/05/2019  . TETANUS/TDAP  10/30/2021  . COLONOSCOPY  12/31/2022  . INFLUENZA VACCINE  Completed  . DEXA SCAN  Completed  . Hepatitis C Screening  Completed  . PNA vac Low Risk Adult  Completed     Exam:  BP  117/77   Pulse 87   Ht 5\' 1"  (1.549 m)   Wt 125 lb (56.7 kg)   BMI 23.62 kg/m  Gen: Well NAD HEENT: EOMI,  MMM Lungs: Normal work of breathing.  Coarse breath sounds. Heart: RRR no MRG Abd: NABS, Soft. Nondistended, Nontender Exts: Brisk capillary refill, warm and well perfused.   2 view chest x-ray my personal interpretation of images reveals no acute findings.  Chronic bronchitic changes present.   No results found for this or any previous visit (from the past 72 hour(s)). No results found.    Assessment and Plan: 72 y.o. female with fatigue nausea feeling cold cough.  Etiology is unclear but associated in time with discontinuation of her morphine abruptly.  This certainly could be morphine withdrawal.  However some other etiology is also possible.  X-ray today does not show pneumonia but the formal radiology review is currently pending.  Plan for limited lab workup listed below as well.  Plan for tapering slowly from morphine with 1/2 pill for a few days then a quarter of a pill for a few days.  Recommend recheck later this week and return sooner if needed.   Orders Placed This Encounter  Procedures  . DG Chest 2 View    Order Specific Question:   Reason for exam:    Answer:   Cough, assess intra-thoracic pathology    Order Specific Question:   Preferred imaging location?    Answer:   [name]  . CBC with Differential/Platelet  . COMPLETE METABOLIC PANEL WITH GFR  . Fe+TIBC+Fer  . T4, free  . TSH  . T3, free   No orders of the defined types were placed in this encounter.    Discussed warning signs or symptoms. Please see discharge instructions. Patient expresses understanding.

## 2018-02-27 LAB — CBC WITH DIFFERENTIAL/PLATELET
BASOS ABS: 29 {cells}/uL (ref 0–200)
Basophils Relative: 0.2 %
EOS PCT: 0.8 %
Eosinophils Absolute: 115 cells/uL (ref 15–500)
HEMATOCRIT: 38.6 % (ref 35.0–45.0)
Hemoglobin: 13.2 g/dL (ref 11.7–15.5)
Lymphs Abs: 1598 cells/uL (ref 850–3900)
MCH: 30.8 pg (ref 27.0–33.0)
MCHC: 34.2 g/dL (ref 32.0–36.0)
MCV: 90.2 fL (ref 80.0–100.0)
MONOS PCT: 8.6 %
MPV: 10.5 fL (ref 7.5–12.5)
NEUTROS PCT: 79.3 %
Neutro Abs: 11419 cells/uL — ABNORMAL HIGH (ref 1500–7800)
Platelets: 252 10*3/uL (ref 140–400)
RBC: 4.28 10*6/uL (ref 3.80–5.10)
RDW: 11.6 % (ref 11.0–15.0)
Total Lymphocyte: 11.1 %
WBC mixed population: 1238 cells/uL — ABNORMAL HIGH (ref 200–950)
WBC: 14.4 10*3/uL — ABNORMAL HIGH (ref 3.8–10.8)

## 2018-02-27 LAB — COMPLETE METABOLIC PANEL WITH GFR
AG Ratio: 1.4 (calc) (ref 1.0–2.5)
ALBUMIN MSPROF: 4 g/dL (ref 3.6–5.1)
ALKALINE PHOSPHATASE (APISO): 75 U/L (ref 33–130)
ALT: 18 U/L (ref 6–29)
AST: 20 U/L (ref 10–35)
BILIRUBIN TOTAL: 0.5 mg/dL (ref 0.2–1.2)
BUN: 13 mg/dL (ref 7–25)
CHLORIDE: 102 mmol/L (ref 98–110)
CO2: 30 mmol/L (ref 20–32)
CREATININE: 0.83 mg/dL (ref 0.60–0.93)
Calcium: 9.5 mg/dL (ref 8.6–10.4)
GFR, EST AFRICAN AMERICAN: 82 mL/min/{1.73_m2} (ref >=60)
GFR, Est Non African American: 71 mL/min/{1.73_m2} (ref >=60)
GLOBULIN: 2.8 g/dL (ref 1.9–3.7)
GLUCOSE: 101 mg/dL — AB (ref 65–99)
Potassium: 3.9 mmol/L (ref 3.5–5.3)
SODIUM: 141 mmol/L (ref 135–146)
TOTAL PROTEIN: 6.8 g/dL (ref 6.1–8.1)

## 2018-02-27 LAB — T4, FREE: Free T4: 0.8 ng/dL (ref 0.8–1.8)

## 2018-02-27 LAB — IRON,TIBC AND FERRITIN PANEL
%SAT: 9 % — AB (ref 11–50)
FERRITIN: 126 ng/mL (ref 20–288)
Iron: 24 ug/dL — ABNORMAL LOW (ref 45–160)
TIBC: 282 mcg/dL (calc) (ref 250–450)

## 2018-02-27 LAB — TSH: TSH: 3.95 m[IU]/L (ref 0.40–4.50)

## 2018-02-27 LAB — T3, FREE: T3 FREE: 2.1 pg/mL — AB (ref 2.3–4.2)

## 2018-02-27 MED ORDER — CEFDINIR 300 MG PO CAPS: 300.0000 mg | ORAL_CAPSULE | Freq: Two times a day (BID) | ORAL | 0 refills | Status: DC

## 2018-02-27 MED ORDER — FERRIC CITRATE 1 GM 210 MG(FE) PO TABS: 420.0000 mg | ORAL_TABLET | Freq: Three times a day (TID) | ORAL | 1 refills | Status: DC

## 2018-02-27 NOTE — Addendum Note (Signed)
Addended by: Gregor Hams on: 02/27/2018 07:07 AM   Modules accepted: Orders

## 2018-02-28 ENCOUNTER — Encounter: Payer: Self-pay | Admitting: Family Medicine

## 2018-03-01 ENCOUNTER — Encounter: Payer: Self-pay | Admitting: Family Medicine

## 2018-03-01 ENCOUNTER — Ambulatory Visit: Payer: Medicare HMO | Admitting: Family Medicine

## 2018-03-01 DIAGNOSIS — Z0189 Encounter for other specified special examinations: Secondary | ICD-10-CM

## 2018-03-05 ENCOUNTER — Telehealth: Payer: Self-pay | Admitting: Family Medicine

## 2018-03-05 NOTE — Telephone Encounter (Signed)
Received fax from [employer] that [place] requires a PA. Information has been sent to the insurance company. Awaiting determination.

## 2018-03-06 DIAGNOSIS — M5431 Sciatica, right side: Secondary | ICD-10-CM | POA: Diagnosis not present

## 2018-03-06 DIAGNOSIS — M5432 Sciatica, left side: Secondary | ICD-10-CM | POA: Diagnosis not present

## 2018-03-06 DIAGNOSIS — M961 Postlaminectomy syndrome, not elsewhere classified: Secondary | ICD-10-CM | POA: Diagnosis not present

## 2018-03-06 DIAGNOSIS — M4726 Other spondylosis with radiculopathy, lumbar region: Secondary | ICD-10-CM | POA: Diagnosis not present

## 2018-03-06 NOTE — Telephone Encounter (Signed)
Received fax from [facility] that [place] was approved from 03/05/2018 until 03/05/2020. Pharmacy notified and forms sent to scan.   Reference ID: [id number].

## 2018-03-09 ENCOUNTER — Ambulatory Visit: Payer: Medicare HMO | Admitting: Rehabilitative and Restorative Service Providers"

## 2018-03-09 ENCOUNTER — Encounter: Payer: Self-pay | Admitting: Rehabilitative and Restorative Service Providers"

## 2018-03-09 DIAGNOSIS — M545 Low back pain, unspecified: Secondary | ICD-10-CM

## 2018-03-09 DIAGNOSIS — R29898 Other symptoms and signs involving the musculoskeletal system: Secondary | ICD-10-CM | POA: Diagnosis not present

## 2018-03-09 DIAGNOSIS — R531 Weakness: Secondary | ICD-10-CM | POA: Diagnosis not present

## 2018-03-09 DIAGNOSIS — G8929 Other chronic pain: Secondary | ICD-10-CM | POA: Diagnosis not present

## 2018-03-09 NOTE — Patient Instructions (Signed)
Abdominal Bracing With Pelvic Floor (Hook-Lying)    With neutral spine, tighten pelvic floor and abdominals sucking belly button to back bone; tighten muscles in low back at waist, slowly exhale. Hold 10 sec  Repeat _10__ times. Do _several__ times a day. Progress to do this in sitting; standing; walking and with functional activities     Sleeping on Back  Place pillow under knees. A pillow with cervical support and a roll around waist are also helpful. Copyright  VHI. All rights reserved.  Sleeping on Side Place pillow between knees. Use cervical support under neck and a roll around waist as needed. Copyright  VHI. All rights reserved.   Sleeping on Stomach   If this is the only desirable sleeping position, place pillow under lower legs, and under stomach or chest as needed.  Posture - Sitting   Sit upright, head facing forward. Try using a roll to support lower back. Keep shoulders relaxed, and avoid rounded back. Keep hips level with knees. Avoid crossing legs for long periods. Stand to Sit / Sit to Stand   To sit: Bend knees to lower self onto front edge of chair, then scoot back on seat. To stand: Reverse sequence by placing one foot forward, and scoot to front of seat. Use rocking motion to stand up.   Work Height and Reach  Ideal work height is no more than 2 to 4 inches below elbow level when standing, and at elbow level when sitting. Reaching should be limited to arm's length, with elbows slightly bent.  Bending  Bend at hips and knees, not back. Keep feet shoulder-width apart.    Posture - Standing   Good posture is important. Avoid slouching and forward head thrust. Maintain curve in low back and align ears over shoul- ders, hips over ankles.  Alternating Positions   Alternate tasks and change positions frequently to reduce fatigue and muscle tension. Take rest breaks. Computer Work   Position work to Programmer, multimedia. Use proper work and seat height. Keep  shoulders back and down, wrists straight, and elbows at right angles. Use chair that provides full back support. Add footrest and lumbar roll as needed.  Getting Into / Out of Car  Lower self onto seat, scoot back, then bring in one leg at a time. Reverse sequence to get out.  Dressing  Lie on back to pull socks or slacks over feet, or sit and bend leg while keeping back straight.    Housework - Sink  Place one foot on ledge of cabinet under sink when standing at sink for prolonged periods.   Pushing / Pulling  Pushing is preferable to pulling. Keep back in proper alignment, and use leg muscles to do the work.  Deep Squat   Squat and lift with both arms held against upper trunk. Tighten stomach muscles without holding breath. Use smooth movements to avoid jerking.  Avoid Twisting   Avoid twisting or bending back. Pivot around using foot movements, and bend at knees if needed when reaching for articles.  Carrying Luggage   Distribute weight evenly on both sides. Use a cart whenever possible. Do not twist trunk. Move body as a unit.   Lifting Principles .Maintain proper posture and head alignment. .Slide object as close as possible before lifting. .Move obstacles out of the way. .Test before lifting; ask for help if too heavy. .Tighten stomach muscles without holding breath. .Use smooth movements; do not jerk. .Use legs to do the work, and pivot with feet. [name]  Distribute the work load symmetrically and close to the center of trunk. .Push instead of pull whenever possible.   Ask For Help   Ask for help and delegate to others when possible. Coordinate your movements when lifting together, and maintain the low back curve.  Log Roll   Lying on back, bend left knee and place left arm across chest. Roll all in one movement to the right. Reverse to roll to the left. Always move as one unit. Housework - Sweeping  Use long-handled equipment to avoid  stooping.   Housework - Wiping  Position yourself as close as possible to reach work surface. Avoid straining your back.  Laundry - Unloading Wash   To unload small items at bottom of washer, lift leg opposite to arm being used to reach.  [place] close to area to be raked. Use arm movements to do the work. Keep back straight and avoid twisting.     Cart  When reaching into cart with one arm, lift opposite leg to keep back straight.   Getting Into / Out of Bed  Lower self to lie down on one side by raising legs and lowering head at the same time. Use arms to assist moving without twisting. Bend both knees to roll onto back if desired. To sit up, start from lying on side, and use same move-ments in reverse. Housework - Vacuuming  Hold the vacuum with arm held at side. Step back and forth to move it, keeping head up. Avoid twisting.   Laundry - IT consultant so that bending and twisting can be avoided.   Laundry - Unloading Dryer  Squat down to reach into clothes dryer or use a reacher.  Gardening - Weeding / Probation officer or Kneel. Knee pads may be helpful.

## 2018-03-09 NOTE — Therapy (Signed)
[name], [name], [address] Phone: [phone]   Fax:  [phone]  Physical Therapy Evaluation  Patient Details  Name: Margaret [name] MRN: [id number] Date of Birth: 09-04-1946 Referring Provider: Dr Ivan Croft   Encounter Date: 03/09/2018  PT End of Session - 03/09/18 1240    Visit Number  1    Number of Visits  18    Date for PT Re-Evaluation  04/23/18    PT Start Time  1152    PT Stop Time  0737    PT Time Calculation (min)  43 min    Activity Tolerance  Patient tolerated treatment well       Past Medical History:  Diagnosis Date  . Anemia   . Anxiety   . Arthritis   . Asthma   . Cancer ([place])   . COPD (chronic obstructive pulmonary disease) ([place])   . Emphysema of lung ([place])   . GERD (gastroesophageal reflux disease)   . Hypertension   . Osteoporosis   . Thyroid disease     Past Surgical History:  Procedure Laterality Date  . ABDOMINAL HYSTERECTOMY    . BREAST SURGERY    . CESAREAN SECTION    . JOINT REPLACEMENT     LT KNEE  . TOTAL KNEE ARTHROPLASTY  2006    There were no vitals filed for this visit.   Subjective Assessment - 03/09/18 1156    Subjective  Patient reports that she has had significant increase in back pain since February. She has increased LBP after taking a yoga class. pain has continued and increased. She was seen by MD with diagnostic tests showing colapse of vertebrae above level of prior surgery.     Pertinent History  Prior lumbar fusioin at L4/5; Lt TKA; Rt TSA 9/13/18following Rt RCR; Lt RCR ~ 2 yrs ago HNT; osteoarthritis; arthritis; COPD; asthma     How long can you sit comfortably?  15 min     How long can you stand comfortably?  20-30 min     How long can you walk comfortably?  15-20 min     Diagnostic tests  Xrays; MRI     Patient Stated Goals  strengthening muscles and decrease pain     Currently in Pain?  Yes    Pain Score  5     Pain  Location  Back    Pain Orientation  Mid;Lower    Pain Descriptors / Indicators  Burning;Aching    Pain Type  Chronic pain    Pain Onset  More than a month ago    Pain Frequency  Constant    Aggravating Factors   prolonged positions; walking; sitting; housework; any activity     Pain Relieving Factors  medicine; heat          [facility] PT Assessment - 03/09/18 0001      Assessment   Medical Diagnosis  LBP     Referring Provider  Dr Audelia Hives Sherlyn Lick    Onset Date/Surgical Date  01/05/18    Hand Dominance  Right    Next MD Visit  04/24/18    Prior Therapy  for knee and shoulders       Precautions   Precautions  None      Restrictions   Weight Bearing Restrictions  No      Balance Screen   Has the patient fallen in the past 6 months  No    Has the patient  had a decrease in activity level because of a fear of falling?   No    Is the patient reluctant to leave their home because of a fear of falling?   No      Home Film/video editor residence      Prior Function   Level of Independence  Independent    Vocation  Retired    Biomedical scientist  PT with [place] household chores walking dog; sometimes getting into the pool at the gym       Observation/Other Assessments   Focus on Therapeutic Outcomes (FOTO)   56% limitation       Sensation   Additional Comments  WFL's bilat LE's per pt report       Posture/Postural Control   Posture Comments  head forward shoulders rounded and elevated      AROM   Overall AROM Comments  pain with all lumbar motions greatest with flexion and extension     Right/Left Hip  -- WFL's bilat tightness Rt end ranges     Lumbar Flexion  15%    Lumbar Extension  0%    Lumbar - Right Side Bend  55%    Lumbar - Left Side Bend  50%    Lumbar - Right Rotation  40%    Lumbar - Left Rotation  40%      Strength   Right Hip Flexion  5/5    Right Hip Extension  4+/5    Right Hip  ABduction  4+/5    Right Hip ADduction  5/5    Left Hip Flexion  5/5    Left Hip Extension  4+/5    Left Hip ABduction  4+/5    Left Hip ADduction  5/5      Flexibility   Hamstrings  tight Rt 70 deg; Lt 75 deg     Quadriceps  tight Rt 125 deg; Lt 112 deg     ITB  mild tightness bilat     Piriformis  tightness Rt > Lt       Palpation   Spinal mobility  lumbar fusion L4/5     SI assessment   ~ symmetrically SI; PSIS in standing     Palpation comment  muscular tightness through the thoracolumbar paraspinals; QL; lats bilat       Transfers   Comments  poor body mechanics with transitional movements sit to supine and supine to sit                 Objective measurements completed on examination: See above findings.      [facility] Adult PT Treatment/Exercise - 03/09/18 0001      Self-Care   Self-Care  -- initiated back care education       Lumbar Exercises: Supine   Other Supine Lumbar Exercises  3 part core 10 sec x 10 reps supine; 10 sec x 5 reps sitting; 10 sec x 3 standing              PT Education - 03/09/18 1231    Education provided  Yes    Education Details  HEP initiated back care education     Person(s) Educated  Patient    Methods  Explanation;Demonstration;Tactile cues;Verbal cues;Handout    Comprehension  Verbalized understanding;Returned demonstration;Verbal cues required;Tactile cues required          PT Long Term Goals - 03/09/18 1247  PT LONG TERM GOAL #1   Title  Improve core stability allowing patient to increase function in sitting, standing and walking by 10-20 min each 04/23/18    Time  6    Period  Weeks    Status  New      PT LONG TERM GOAL #2   Title  Increase LT strength to 5-/5 to 5/5 throughout 04/23/18    Time  6    Period  Weeks    Status  New      PT LONG TERM GOAL #3   Title  Improve trunk mobility in al planes by 5-10% allowing patient to more easily perform functional activities such as putting on her shoes  04/23/18    Time  6    Period  Weeks    Status  New      PT LONG TERM GOAL #4   Title  independent in HEP 04/23/18    Time  6    Period  Weeks    Status  New      PT LONG TERM GOAL #5   Title  Improve FOTO to </= 47% limitation 04/23/18    Time  6    Period  Weeks    Status  New             Plan - 03/09/18 1241    Clinical Impression Statement  [name] presents with singnificant flare up of LBP starting 2/19 and persisting. MRI, xray, bone scan show degenerative changes in the lumbar spine per pt report. She will be scheduled for three level fusiion on Lumbar spine above the currently fused levels L4/5. Inez Catalina reports constant pain in the LB but resolved pain into the LE's. She presents with significant limitations in trunk mobility and ROM; core weakness; LE weakness; limited functional activities; pain on a daily basis.     History and Personal Factors relevant to plan of care:  prior lumbar fusion; Lt TKA; Rt TSA; asthma; COPD; HTN     Clinical Presentation  Evolving    Clinical Decision Making  Moderate    Rehab Potential  Good    Clinical Impairments Affecting Rehab Potential  ? extent of degenerative changes in the lumbar spine     PT Frequency  3x / week    PT Duration  6 weeks    PT Treatment/Interventions  Patient/family education;ADLs/Self Care Home Management;Cryotherapy;Electrical Stimulation;Iontophoresis 4mg /ml Dexamethasone;Moist Heat;Ultrasound;Dry needling;Manual techniques;Neuromuscular re-education;Therapeutic activities;Therapeutic exercise    PT Next Visit Plan  core stabilization; LE strengthening; education in back care and proper body mechanics; manual work/modalities as indicated     Consulted and Agree with Plan of Care  Patient       Patient will benefit from skilled therapeutic intervention in order to improve the following deficits and impairments:  Improper body mechanics, Postural dysfunction, Decreased mobility, Decreased range of motion, Decreased  strength, Decreased activity tolerance  Visit Diagnosis: Chronic bilateral low back pain without sciatica - Plan: PT plan of care cert/re-cert  Weakness generalized - Plan: PT plan of care cert/re-cert  Other symptoms and signs involving the musculoskeletal system - Plan: PT plan of care cert/re-cert     Problem List Patient Active Problem List   Diagnosis Date Noted  . H/O total knee replacement, left 10/23/2017  . Iron deficiency anemia 05/31/2017  . Pulmonary nodules 12/26/2016  . Shoulder pain, right 12/25/2016  . Other chronic pain 11/03/2016  . Thyroid nodule 03/30/2016  . Solitary pulmonary nodule 03/30/2016  .  Strain of gluteus medius 10/07/2015  . Depression with anxiety 09/29/2015  . Hip pain, acute 08/31/2015  . Vitamin D insufficiency 04/08/2015  . Elevated vitamin B12 level 04/08/2015  . Hirsutism 01/28/2015  . Osteoarthritis of right knee 11/04/2014  . Pain in right hip 08/21/2014  . Hair loss 08/21/2014  . Hypothyroidism 07/24/2014  . Vasomotor flushing 03/05/2014  . Intrinsic asthma 12/17/2013  . Spinal stenosis of lumbar region 10/14/2013  . Coffee ground emesis 03/06/2013  . Disorder of bursae and tendons in shoulder region 03/01/2013  . Anal fissure 02/22/2013  . Diverticulosis of colon without hemorrhage 02/22/2013  . Hyperglycemia 02/01/2013  . Essential hypertension, benign 01/31/2013  . CAP (community acquired pneumonia) 01/31/2013  . Osteoporosis 01/31/2013  . HSV infection 01/31/2013    Ceirra Belli Nilda Simmer PT, MPH 03/09/2018, 12:51 PM  [facility] [place] [address] [place], [name], [address] Phone: [phone]   Fax:  [phone]  Name: Margaret [name] MRN: [id number] Date of Birth: November 20, 1946

## 2018-03-12 ENCOUNTER — Encounter: Payer: Self-pay | Admitting: Physical Therapy

## 2018-03-12 ENCOUNTER — Ambulatory Visit: Payer: Medicare HMO | Admitting: Physical Therapy

## 2018-03-12 DIAGNOSIS — M545 Low back pain, unspecified: Secondary | ICD-10-CM

## 2018-03-12 DIAGNOSIS — G8929 Other chronic pain: Secondary | ICD-10-CM | POA: Diagnosis not present

## 2018-03-12 DIAGNOSIS — R29898 Other symptoms and signs involving the musculoskeletal system: Secondary | ICD-10-CM | POA: Diagnosis not present

## 2018-03-12 DIAGNOSIS — R531 Weakness: Secondary | ICD-10-CM | POA: Diagnosis not present

## 2018-03-12 NOTE — Therapy (Signed)
[place], [name], [address] Phone: [phone]   Fax:  [phone]  Physical Therapy Treatment  Patient Details  Name: Margaret [name] MRN: [id number] Date of Birth: [birth year] Referring Provider: Dr. Ivan Croft   Encounter Date: 03/12/2018  PT End of Session - 03/12/18 0723    Visit Number  2    Number of Visits  18    Date for PT Re-Evaluation  04/23/18    PT Start Time  0719    PT Stop Time  0757    PT Time Calculation (min)  38 min    Activity Tolerance  Patient tolerated treatment well    Behavior During Therapy  [facility] for tasks assessed/performed       Past Medical History:  Diagnosis Date  . Anemia   . Anxiety   . Arthritis   . Asthma   . Cancer ([place])   . COPD (chronic obstructive pulmonary disease) ([place])   . Emphysema of lung ([place])   . GERD (gastroesophageal reflux disease)   . Hypertension   . Osteoporosis   . Thyroid disease     Past Surgical History:  Procedure Laterality Date  . ABDOMINAL HYSTERECTOMY    . BREAST SURGERY    . CESAREAN SECTION    . JOINT REPLACEMENT     LT KNEE  . TOTAL KNEE ARTHROPLASTY  2006    There were no vitals filed for this visit.  Subjective Assessment - 03/12/18 0740    Subjective  Pt reports she has been working on tightening her abdominals since last visit.  She feels the same as she did last visit.      Currently in Pain?  Yes    Pain Score  4  up to 6/10 with movement.     Pain Location  Back    Pain Orientation  Lower;Right    Pain Descriptors / Indicators  Sore    Aggravating Factors   moving    Pain Relieving Factors  not moving.          [facility] PT Assessment - 03/12/18 0001      Assessment   Medical Diagnosis  LBP     Referring Provider  Dr. Ivan Croft    Onset Date/Surgical Date  01/05/18    Hand Dominance  Right    Next MD Visit  04/24/18    Prior Therapy  for knee and shoulders         [facility] Adult PT  Treatment/Exercise - 03/12/18 0001      Lumbar Exercises: Stretches   Passive Hamstring Stretch  -- will add next visit      Lumbar Exercises: Standing   Shoulder Extension  Strengthening;Both;10 reps;Theraband    Theraband Level (Shoulder Extension)  Level 1 (Yellow) core engaged      Lumbar Exercises: Seated   Other Seated Lumbar Exercises  3 part core in sitting x 5 sec x 5 reps; repeated with hand press into PTA, then lap x 10      Lumbar Exercises: Supine   Clam  10 reps with 3 part core    Bent Knee Raise  10 reps with 3 part core    Dead Bug  10 reps with 3 part core    Other Supine Lumbar Exercises  3 part core 10 sec x 10 reps supine      Modalities   Modalities  -- pt declined; will use at home.  Manual Therapy   Manual Therapy  Soft tissue mobilization;Myofascial release    Manual therapy comments  pt prone    Soft tissue mobilization  to bilat lumbar and lower thoracic paraspinals     Myofascial Release  to bilat lumbar and lower thoracic paraspinals              PT Education - 03/12/18 0745    Education provided  Yes    Education Details  HEP    Person(s) Educated  Patient    Methods  Explanation    Comprehension  Verbalized understanding          PT Long Term Goals - 03/12/18 1007      PT LONG TERM GOAL #1   Title  Improve core stability allowing patient to increase function in sitting, standing and walking by 10-20 min each 04/23/18    Time  6    Period  Weeks    Status  On-going      PT LONG TERM GOAL #2   Title  Increase LT strength to 5-/5 to 5/5 throughout 04/23/18    Time  6    Period  Weeks    Status  On-going      PT LONG TERM GOAL #3   Title  Improve trunk mobility in al planes by 5-10% allowing patient to more easily perform functional activities such as putting on her shoes 04/23/18    Time  6    Period  Weeks    Status  On-going      PT LONG TERM GOAL #4   Title  independent in HEP 04/23/18    Time  6    Period  Weeks     Status  On-going      PT LONG TERM GOAL #5   Title  Improve FOTO to </= 47% limitation 04/23/18    Time  6    Period  Weeks    Status  On-going            Plan - 03/12/18 0845    Clinical Impression Statement  Pt was able to engage transverse abdominus with minimal cues. She tolerated all exercises well, without report of increased pain. She required minor cues on counting repetitions. Pt reported slight increase in pain in midback after manual therapy.  She declined modalities as she will use at home.  No new goals met; only 2nd visit.     Rehab Potential  Good    PT Frequency  3x / week    PT Duration  6 weeks    PT Treatment/Interventions  Patient/family education;ADLs/Self Care Home Management;Cryotherapy;Electrical Stimulation;Iontophoresis 23m/ml Dexamethasone;Moist Heat;Ultrasound;Dry needling;Manual techniques;Neuromuscular re-education;Therapeutic activities;Therapeutic exercise    PT Next Visit Plan  core stabilization; LE strengthening; education in back care and proper body mechanics; manual work/modalities as indicated     Consulted and Agree with Plan of Care  Patient       Patient will benefit from skilled therapeutic intervention in order to improve the following deficits and impairments:  Improper body mechanics, Postural dysfunction, Decreased mobility, Decreased range of motion, Decreased strength, Decreased activity tolerance  Visit Diagnosis: Chronic bilateral low back pain without sciatica  Weakness generalized  Other symptoms and signs involving the musculoskeletal system     Problem List Patient Active Problem List   Diagnosis Date Noted  . H/O total knee replacement, left 10/23/2017  . Iron deficiency anemia 05/31/2017  . Pulmonary nodules 12/26/2016  . Shoulder pain, right 12/25/2016  .  Other chronic pain 11/03/2016  . Thyroid nodule 03/30/2016  . Solitary pulmonary nodule 03/30/2016  . Strain of gluteus medius 10/07/2015  . Depression with  anxiety 09/29/2015  . Hip pain, acute 08/31/2015  . Vitamin D insufficiency 04/08/2015  . Elevated vitamin B12 level 04/08/2015  . Hirsutism 01/28/2015  . Osteoarthritis of right knee 11/04/2014  . Pain in right hip 08/21/2014  . Hair loss 08/21/2014  . Hypothyroidism 07/24/2014  . Vasomotor flushing 03/05/2014  . Intrinsic asthma 12/17/2013  . Spinal stenosis of lumbar region 10/14/2013  . Coffee ground emesis 03/06/2013  . Disorder of bursae and tendons in shoulder region 03/01/2013  . Anal fissure 02/22/2013  . Diverticulosis of colon without hemorrhage 02/22/2013  . Hyperglycemia 02/01/2013  . Essential hypertension, benign 01/31/2013  . CAP (community acquired pneumonia) 01/31/2013  . Osteoporosis 01/31/2013  . HSV infection 01/31/2013   Kerin Perna, PTA 03/12/18 10:09 AM  [place], [name], [address] Phone: [phone]   Fax:  [phone]  Name: Margaret [name] MRN: [id number] Date of Birth: 29-Oct-1946

## 2018-03-12 NOTE — Patient Instructions (Addendum)
  Abdominal Bracing With Pelvic Floor (Hook-Lying)   With neutral spine, tighten pelvic floor and abdominals. Hold 10 seconds. Repeat __10_ times. Do _several__ times a day. Can be performed in sitting, standing. .  Knee to Chest: Transverse Plane Stability   Bring one knee up, then return. Be sure pelvis does not roll side to side. Keep pelvis still. Lift knee __10_ times each leg. Restabilize pelvis. Repeat with other leg. Do _1-2__ sets, _1-2__ times per day.  Hip External Rotation With Pillow: Transverse Plane Stability   KEEP BOTH KNEES BENT.  Slowly roll bent knee out. Be sure pelvis does not rotate. Do _10__ times. Restabilize pelvis. Repeat with other leg. Do _1-2__ sets, _1__ times per day.   Strengthening: Resisted Extension   Hold tubing with both hands, arms forward. Pull arms back, elbow straight. Engage core muscles.  Repeat _10___ times per set. Do __1-2__ sets per session. Do _1___ sessions per day.   [facility] [place], [place] [address]  [phone] (office) [phone] (fax)

## 2018-03-14 ENCOUNTER — Encounter: Payer: Self-pay | Admitting: Rehabilitative and Restorative Service Providers"

## 2018-03-14 ENCOUNTER — Ambulatory Visit (INDEPENDENT_AMBULATORY_CARE_PROVIDER_SITE_OTHER): Payer: Medicare HMO | Admitting: Rehabilitative and Restorative Service Providers"

## 2018-03-14 DIAGNOSIS — R29898 Other symptoms and signs involving the musculoskeletal system: Secondary | ICD-10-CM | POA: Diagnosis not present

## 2018-03-14 DIAGNOSIS — R531 Weakness: Secondary | ICD-10-CM | POA: Diagnosis not present

## 2018-03-14 DIAGNOSIS — M545 Low back pain, unspecified: Secondary | ICD-10-CM

## 2018-03-14 DIAGNOSIS — G8929 Other chronic pain: Secondary | ICD-10-CM

## 2018-03-14 NOTE — Patient Instructions (Addendum)
HIP: Hamstrings - Supine   Place strap around foot. Raise leg up, keeping knee straight.  Bend opposite knee to protect back if indicated. Hold 30 seconds. 3 reps per set, 2-3 sets per day    Piriformis Stretch   Lying on back, pull right knee toward opposite shoulder. Hold 30 seconds. Repeat 3 times. Do 2-3 sessions per day.   TENS UNIT: This is helpful for muscle pain and spasm.   Search and Purchase a TENS 7000 2nd edition at www.tenspros.com. It should be less than $30.     TENS unit instructions: Do not shower or bathe with the unit on Turn the unit off before removing electrodes or batteries If the electrodes lose stickiness add a drop of water to the electrodes after they are disconnected from the unit and place on plastic sheet. If you continued to have difficulty, call the TENS unit company to purchase more electrodes. Do not apply lotion on the skin area prior to use. Make sure the skin is clean and dry as this will help prolong the life of the electrodes. After use, always check skin for unusual red areas, rash or other skin difficulties. If there are any skin problems, does not apply electrodes to the same area. Never remove the electrodes from the unit by pulling the wires. Do not use the TENS unit or electrodes other than as directed. Do not change electrode placement without consultating your therapist or physician. Keep 2 fingers with between each electrode.          

## 2018-03-14 NOTE — Therapy (Addendum)
[place], [name], [address] Phone: [phone]   Fax:  [phone]  Physical Therapy Treatment  Patient Details  Name: Margaret [name] MRN: [id number] Date of Birth: Jul 09, 1946 Referring Provider: Dr. Ivan Croft   Encounter Date: 03/14/2018  PT End of Session - 03/14/18 1025    Visit Number  3    Number of Visits  18    Date for PT Re-Evaluation  04/23/18    PT Start Time  1025    PT Stop Time  1110    PT Time Calculation (min)  45 min    Activity Tolerance  Patient tolerated treatment well       Past Medical History:  Diagnosis Date  . Anemia   . Anxiety   . Arthritis   . Asthma   . Cancer ([place])   . COPD (chronic obstructive pulmonary disease) ([place])   . Emphysema of lung ([place])   . GERD (gastroesophageal reflux disease)   . Hypertension   . Osteoporosis   . Thyroid disease     Past Surgical History:  Procedure Laterality Date  . ABDOMINAL HYSTERECTOMY    . BREAST SURGERY    . CESAREAN SECTION    . JOINT REPLACEMENT     LT KNEE  . TOTAL KNEE ARTHROPLASTY  2006    There were no vitals filed for this visit.  Subjective Assessment - 03/14/18 1028    Subjective  Margaret [name] reports that her back has been hurting more yesterday and today. She has not felt like doing exercises. Feels the massage was a little too much - increased pain in the LB    Currently in Pain?  Yes    Pain Score  5     Pain Location  Back    Pain Orientation  Lower;Right    Pain Descriptors / Indicators  Sore;Tightness;Aching    Pain Type  Chronic pain    Pain Onset  More than a month ago    Pain Frequency  Constant                       [facility] Adult PT Treatment/Exercise - 03/14/18 0001      Lumbar Exercises: Stretches   Passive Hamstring Stretch  Right;Left;2 reps;30 seconds    Single Knee to Chest Stretch  5 reps;10 seconds    Piriformis Stretch  Right;2 reps;30 seconds supine travell        Lumbar Exercises: Aerobic   Nustep  L5 (arms 12) - x 6 min       Lumbar Exercises: Supine   Clam  10 reps with 3 part core alternating legs     Bent Knee Raise  10 reps with 3 part core    Other Supine Lumbar Exercises  3 part core 10 sec x 10 reps supine    Other Supine Lumbar Exercises  shoulder flex/ext x 15 each UE - arms at 90 deg - shd toward table and back to 90 deg alternating UE's with core engaged throughout       Moist Heat Therapy   Number Minutes Moist Heat  20 Minutes    Moist Heat Location  Lumbar Spine;Hip      Electrical Stimulation   Electrical Stimulation Location  bilat lumbar; Lt posterior hip     Electrical Stimulation Action  IFC    Electrical Stimulation Parameters  to tolerance    Electrical Stimulation Goals  Pain;Tone  PT Education - 03/14/18 1041    Education provided  Yes    Education Details  HEP     Person(s) Educated  Patient    Methods  Explanation;Demonstration;Tactile cues;Verbal cues;Handout    Comprehension  Verbalized understanding;Returned demonstration;Verbal cues required;Tactile cues required          PT Long Term Goals - 03/12/18 1007      PT LONG TERM GOAL #1   Title  Improve core stability allowing patient to increase function in sitting, standing and walking by 10-20 min each 04/23/18    Time  6    Period  Weeks    Status  On-going      PT LONG TERM GOAL #2   Title  Increase LT strength to 5-/5 to 5/5 throughout 04/23/18    Time  6    Period  Weeks    Status  On-going      PT LONG TERM GOAL #3   Title  Improve trunk mobility in al planes by 5-10% allowing patient to more easily perform functional activities such as putting on her shoes 04/23/18    Time  6    Period  Weeks    Status  On-going      PT LONG TERM GOAL #4   Title  independent in HEP 04/23/18    Time  6    Period  Weeks    Status  On-going      PT LONG TERM GOAL #5   Title  Improve FOTO to </= 47% limitation 04/23/18    Time  6     Period  Weeks    Status  On-going            Plan - 03/14/18 1030    Clinical Impression Statement  Patient worked well with exercises in clinic today - scaled back on exercise intensity due to increased pain today.     Rehab Potential  Good    Clinical Impairments Affecting Rehab Potential  ? extent of degenerative changes in the lumbar spine     PT Frequency  3x / week    PT Duration  6 weeks    PT Treatment/Interventions  Patient/family education;ADLs/Self Care Home Management;Cryotherapy;Electrical Stimulation;Iontophoresis 4mg /ml Dexamethasone;Moist Heat;Ultrasound;Dry needling;Manual techniques;Neuromuscular re-education;Therapeutic activities;Therapeutic exercise    PT Next Visit Plan  core stabilization; LE strengthening; education in back care and proper body mechanics; manual work/modalities as indicated     Consulted and Agree with Plan of Care  Patient       Patient will benefit from skilled therapeutic intervention in order to improve the following deficits and impairments:  Improper body mechanics, Postural dysfunction, Decreased mobility, Decreased range of motion, Decreased strength, Decreased activity tolerance  Visit Diagnosis: Chronic bilateral low back pain without sciatica  Weakness generalized  Other symptoms and signs involving the musculoskeletal system     Problem List Patient Active Problem List   Diagnosis Date Noted  . H/O total knee replacement, left 10/23/2017  . Iron deficiency anemia 05/31/2017  . Pulmonary nodules 12/26/2016  . Shoulder pain, right 12/25/2016  . Other chronic pain 11/03/2016  . Thyroid nodule 03/30/2016  . Solitary pulmonary nodule 03/30/2016  . Strain of gluteus medius 10/07/2015  . Depression with anxiety 09/29/2015  . Hip pain, acute 08/31/2015  . Vitamin D insufficiency 04/08/2015  . Elevated vitamin B12 level 04/08/2015  . Hirsutism 01/28/2015  . Osteoarthritis of right knee 11/04/2014  . Pain in right hip  08/21/2014  . Hair loss  08/21/2014  . Hypothyroidism 07/24/2014  . Vasomotor flushing 03/05/2014  . Intrinsic asthma 12/17/2013  . Spinal stenosis of lumbar region 10/14/2013  . Coffee ground emesis 03/06/2013  . Disorder of bursae and tendons in shoulder region 03/01/2013  . Anal fissure 02/22/2013  . Diverticulosis of colon without hemorrhage 02/22/2013  . Hyperglycemia 02/01/2013  . Essential hypertension, benign 01/31/2013  . CAP (community acquired pneumonia) 01/31/2013  . Osteoporosis 01/31/2013  . HSV infection 01/31/2013    Celyn Nilda Simmer PT, MPH  03/14/2018, 10:58 AM  [facility] [place], [name], [address] Phone: [phone]   Fax:  [phone]  Name: Margaret [name] MRN: [id number] Date of Birth: [birth year]

## 2018-03-16 ENCOUNTER — Encounter: Payer: Medicare HMO | Admitting: Rehabilitative and Restorative Service Providers"

## 2018-03-19 ENCOUNTER — Ambulatory Visit: Payer: Medicare HMO | Admitting: Physical Therapy

## 2018-03-19 DIAGNOSIS — M545 Low back pain, unspecified: Secondary | ICD-10-CM

## 2018-03-19 DIAGNOSIS — G8929 Other chronic pain: Secondary | ICD-10-CM

## 2018-03-19 DIAGNOSIS — R531 Weakness: Secondary | ICD-10-CM | POA: Diagnosis not present

## 2018-03-19 DIAGNOSIS — R29898 Other symptoms and signs involving the musculoskeletal system: Secondary | ICD-10-CM

## 2018-03-19 NOTE — Therapy (Signed)
[place], [name], [address] Phone: [phone]   Fax:  [phone]  Physical Therapy Treatment  Patient Details  Name: Margaret [name] MRN: [id number] Date of Birth: [birth year] Referring Provider: Dr. Ivan Croft    Encounter Date: 03/19/2018  PT End of Session - 03/19/18 1024    Visit Number  4    Number of Visits  18    Date for PT Re-Evaluation  04/23/18    PT Start Time  1020    PT Stop Time  1117    PT Time Calculation (min)  57 min       Past Medical History:  Diagnosis Date  . Anemia   . Anxiety   . Arthritis   . Asthma   . Cancer ([place])   . COPD (chronic obstructive pulmonary disease) ([place])   . Emphysema of lung ([place])   . GERD (gastroesophageal reflux disease)   . Hypertension   . Osteoporosis   . Thyroid disease     Past Surgical History:  Procedure Laterality Date  . ABDOMINAL HYSTERECTOMY    . BREAST SURGERY    . CESAREAN SECTION    . JOINT REPLACEMENT     LT KNEE  . TOTAL KNEE ARTHROPLASTY  2006    There were no vitals filed for this visit.  Subjective Assessment - 03/19/18 1025    Subjective  Pt reports she has had a terrible cold and had to miss last therapy session. She hasn't performed much exercise due to not feeling well.  She watched her god-child yesterday (1 yo), minor lifting of her.  She had some increased pain in back with making bed this morning.     Patient Stated Goals  strengthening muscles and decrease pain     Currently in Pain?  Yes    Pain Score  6  took pain medicine 1 hr prior to session.     Pain Location  Back    Pain Orientation  Mid;Lower    Pain Descriptors / Indicators  Aching;Sore    Aggravating Factors   bending over    Pain Relieving Factors  bengay, medicine         [facility] PT Assessment - 03/19/18 0001      Assessment   Medical Diagnosis  LBP     Referring Provider  Dr. Ivan Croft     Onset Date/Surgical Date  01/05/18     Hand Dominance  Right    Next MD Visit  04/24/18       [facility] Adult PT Treatment/Exercise - 03/19/18 0001      Lumbar Exercises: Stretches   Passive Hamstring Stretch  Right;Left;2 reps 45 seconds, supine with strap    Single Knee to Chest Stretch  2 reps;30 seconds    Piriformis Stretch  Right;2 reps;30 seconds supine travell     Other Lumbar Stretch Exercise  Meeks leg lengthener / leg press 5s x 10 reps, both sides R side with arm lengthener      Lumbar Exercises: Supine   Clam  10 reps with 3 part core alternating legs, 2s pause; green band    Dead Bug  20 reps opp arm/leg lift   Other Supine Lumbar Exercises  bridge isometric w/o buttocks rising off table x10 reps      Knee/Hip Exercises: Stretches   Other Knee/Hip Stretches  butterfly stretch 30s x3 reps      Moist Heat Therapy   Number Minutes Moist  Heat  20 Minutes    Moist Heat Location  Lumbar Spine;Hip      Electrical Stimulation   Electrical Stimulation Location  bilat lumbar; Lt posterior hip     Electrical Stimulation Action  IFC    Electrical Stimulation Parameters   to tolerance    Electrical Stimulation Goals  Tone;Pain         PT Long Term Goals - 03/12/18 1007      PT LONG TERM GOAL #1   Title  Improve core stability allowing patient to increase function in sitting, standing and walking by 10-20 min each 04/23/18    Time  6    Period  Weeks    Status  On-going      PT LONG TERM GOAL #2   Title  Increase LT strength to 5-/5 to 5/5 throughout 04/23/18    Time  6    Period  Weeks    Status  On-going      PT LONG TERM GOAL #3   Title  Improve trunk mobility in al planes by 5-10% allowing patient to more easily perform functional activities such as putting on her shoes 04/23/18    Time  6    Period  Weeks    Status  On-going      PT LONG TERM GOAL #4   Title  independent in HEP 04/23/18    Time  6    Period  Weeks    Status  On-going      PT LONG TERM GOAL #5   Title  Improve FOTO to </= 47%  limitation 04/23/18    Time  6    Period  Weeks    Status  On-going            Plan - 03/19/18 1033    Clinical Impression Statement  Pt reports no change in symptoms since initiating therapy.  Encouraged pt to adhere to back care principles when outside of therapy. She tolerated exercises well in the decompression position. She reported decrease in back pain at end of session. Pt making gradual progress towards goals.     Rehab Potential  Good    PT Frequency  3x / week    PT Duration  6 weeks    PT Treatment/Interventions  Patient/family education;ADLs/Self Care Home Management;Cryotherapy;Electrical Stimulation;Iontophoresis 4mg /ml Dexamethasone;Moist Heat;Ultrasound;Dry needling;Manual techniques;Neuromuscular re-education;Therapeutic activities;Therapeutic exercise    PT Next Visit Plan  core stabilization; LE strengthening; education in back care and proper body mechanics; manual work/modalities as indicated     Consulted and Agree with Plan of Care  Patient       Patient will benefit from skilled therapeutic intervention in order to improve the following deficits and impairments:  Improper body mechanics, Postural dysfunction, Decreased mobility, Decreased range of motion, Decreased strength, Decreased activity tolerance  Visit Diagnosis: Chronic bilateral low back pain without sciatica  Weakness generalized  Other symptoms and signs involving the musculoskeletal system     Problem List Patient Active Problem List   Diagnosis Date Noted  . H/O total knee replacement, left 10/23/2017  . Iron deficiency anemia 05/31/2017  . Pulmonary nodules 12/26/2016  . Shoulder pain, right 12/25/2016  . Other chronic pain 11/03/2016  . Thyroid nodule 03/30/2016  . Solitary pulmonary nodule 03/30/2016  . Strain of gluteus medius 10/07/2015  . Depression with anxiety 09/29/2015  . Hip pain, acute 08/31/2015  . Vitamin D insufficiency 04/08/2015  . Elevated vitamin B12 level  04/08/2015  . Hirsutism 01/28/2015  .  Osteoarthritis of right knee 11/04/2014  . Pain in right hip 08/21/2014  . Hair loss 08/21/2014  . Hypothyroidism 07/24/2014  . Vasomotor flushing 03/05/2014  . Intrinsic asthma 12/17/2013  . Spinal stenosis of lumbar region 10/14/2013  . Coffee ground emesis 03/06/2013  . Disorder of bursae and tendons in shoulder region 03/01/2013  . Anal fissure 02/22/2013  . Diverticulosis of colon without hemorrhage 02/22/2013  . Hyperglycemia 02/01/2013  . Essential hypertension, benign 01/31/2013  . CAP (community acquired pneumonia) 01/31/2013  . Osteoporosis 01/31/2013  . HSV infection 01/31/2013   Kerin Perna, PTA 03/19/18 11:24 AM  [place], [name], [address] Phone: [phone]   Fax:  [phone]  Name: Bani Gianfrancesco MRN: [id number] Date of Birth: February 13, 1946

## 2018-03-21 ENCOUNTER — Ambulatory Visit (INDEPENDENT_AMBULATORY_CARE_PROVIDER_SITE_OTHER): Payer: Medicare HMO | Admitting: Rehabilitative and Restorative Service Providers"

## 2018-03-21 DIAGNOSIS — R531 Weakness: Secondary | ICD-10-CM | POA: Diagnosis not present

## 2018-03-21 DIAGNOSIS — R29898 Other symptoms and signs involving the musculoskeletal system: Secondary | ICD-10-CM | POA: Diagnosis not present

## 2018-03-21 DIAGNOSIS — M545 Low back pain, unspecified: Secondary | ICD-10-CM

## 2018-03-21 DIAGNOSIS — G8929 Other chronic pain: Secondary | ICD-10-CM

## 2018-03-21 NOTE — Therapy (Signed)
[facility] [place], [name], [address] Phone: [phone]   Fax:  [phone]  Physical Therapy Treatment  Patient Details  Name: Margaret [name] MRN: [id number] Date of Birth: November 07, 1946 Referring Provider: Dr. Ivan Croft    Encounter Date: 03/21/2018  PT End of Session - 03/21/18 1108    Visit Number  5    Number of Visits  18    Date for PT Re-Evaluation  04/23/18    PT Start Time  1105    PT Stop Time  1144    PT Time Calculation (min)  39 min    Activity Tolerance  Patient tolerated treatment well       Past Medical History:  Diagnosis Date  . Anemia   . Anxiety   . Arthritis   . Asthma   . Cancer ([place])   . COPD (chronic obstructive pulmonary disease) ([place])   . Emphysema of lung ([place])   . GERD (gastroesophageal reflux disease)   . Hypertension   . Osteoporosis   . Thyroid disease     Past Surgical History:  Procedure Laterality Date  . ABDOMINAL HYSTERECTOMY    . BREAST SURGERY    . CESAREAN SECTION    . JOINT REPLACEMENT     LT KNEE  . TOTAL KNEE ARTHROPLASTY  2006    There were no vitals filed for this visit.  Subjective Assessment - 03/21/18 1110    Subjective  Back hurts "all the time". She can't get any relieve. She is working on the exercises at home and watching how she moves.     Currently in Pain?  Yes    Pain Score  7     Pain Location  Back    Pain Orientation  Mid;Lower    Pain Descriptors / Indicators  Aching;Sore    Pain Type  Chronic pain    Pain Onset  More than a month ago    Pain Frequency  Constant                       [facility] Adult PT Treatment/Exercise - 03/21/18 0001      Self-Care   Self-Care  -- continue with education re- transitional movements/back care      Lumbar Exercises: Stretches   Passive Hamstring Stretch  Right;Left;2 reps 45 seconds, supine with strap    Single Knee to Chest Stretch  2 reps;30 seconds    Piriformis  Stretch  Right;2 reps;30 seconds supine travell     Other Lumbar Stretch Exercise  Meeks leg lengthener / leg press 5s x10 reps, both sides      Lumbar Exercises: Aerobic   Nustep  L5 (arms 11) - x 6 min       Lumbar Exercises: Standing   Functional Squats  10 reps 2 sets     Wall Slides  20 reps orange ball along wall       Lumbar Exercises: Supine   AB Set Limitations  3 part core 10 sec x 10     Clam  20 reps with 3 part core alternating legs, 2s pause; green band    Dead Bug  10 reps core engaged     Other Supine Lumbar Exercises  diagonal UE yellow TB x 10 each UE - core engaged     Other Supine Lumbar Exercises  shoulder flex/ext x 15 each UE - arms at 90 deg - shd toward table and back  to 90 deg alternating UE's with core engaged throughout       Modalities   Modalities  -- pt declined modalities - due to her schedule today                  PT Long Term Goals - 03/21/18 1109      PT LONG TERM GOAL #1   Title  Improve core stability allowing patient to increase function in sitting, standing and walking by 10-20 min each 04/23/18    Time  6    Period  Weeks    Status  On-going      PT LONG TERM GOAL #2   Title  Increase LT strength to 5-/5 to 5/5 throughout 04/23/18    Time  6    Period  Weeks    Status  On-going      PT LONG TERM GOAL #3   Title  Improve trunk mobility in al planes by 5-10% allowing patient to more easily perform functional activities such as putting on her shoes 04/23/18    Time  6    Period  Weeks    Status  On-going      PT LONG TERM GOAL #4   Title  independent in HEP 04/23/18    Time  6    Period  Weeks    Status  On-going      PT LONG TERM GOAL #5   Title  Improve FOTO to </= 47% limitation 04/23/18    Time  6    Period  Weeks    Status  On-going            Plan - 03/21/18 1109    Clinical Impression Statement  No change in symptoms with therapy and home program.     Rehab Potential  Good    Clinical Impairments  Affecting Rehab Potential  ? extent of degenerative changes in the lumbar spine     PT Frequency  3x / week    PT Duration  6 weeks    PT Treatment/Interventions  Patient/family education;ADLs/Self Care Home Management;Cryotherapy;Electrical Stimulation;Iontophoresis 4mg /ml Dexamethasone;Moist Heat;Ultrasound;Dry needling;Manual techniques;Neuromuscular re-education;Therapeutic activities;Therapeutic exercise    PT Next Visit Plan  core stabilization; LE strengthening; education in back care and proper body mechanics; manual work/modalities as indicated     Consulted and Agree with Plan of Care  Patient       Patient will benefit from skilled therapeutic intervention in order to improve the following deficits and impairments:  Improper body mechanics, Postural dysfunction, Decreased mobility, Decreased range of motion, Decreased strength, Decreased activity tolerance  Visit Diagnosis: Chronic bilateral low back pain without sciatica  Weakness generalized  Other symptoms and signs involving the musculoskeletal system     Problem List Patient Active Problem List   Diagnosis Date Noted  . H/O total knee replacement, left 10/23/2017  . Iron deficiency anemia 05/31/2017  . Pulmonary nodules 12/26/2016  . Shoulder pain, right 12/25/2016  . Other chronic pain 11/03/2016  . Thyroid nodule 03/30/2016  . Solitary pulmonary nodule 03/30/2016  . Strain of gluteus medius 10/07/2015  . Depression with anxiety 09/29/2015  . Hip pain, acute 08/31/2015  . Vitamin D insufficiency 04/08/2015  . Elevated vitamin B12 level 04/08/2015  . Hirsutism 01/28/2015  . Osteoarthritis of right knee 11/04/2014  . Pain in right hip 08/21/2014  . Hair loss 08/21/2014  . Hypothyroidism 07/24/2014  . Vasomotor flushing 03/05/2014  . Intrinsic asthma 12/17/2013  . Spinal stenosis of  lumbar region 10/14/2013  . Coffee ground emesis 03/06/2013  . Disorder of bursae and tendons in shoulder region 03/01/2013  .  Anal fissure 02/22/2013  . Diverticulosis of colon without hemorrhage 02/22/2013  . Hyperglycemia 02/01/2013  . Essential hypertension, benign 01/31/2013  . CAP (community acquired pneumonia) 01/31/2013  . Osteoporosis 01/31/2013  . HSV infection 01/31/2013    Satya Buttram Nilda Simmer PT, MPH  03/21/2018, 11:41 AM  [facility] [place], [name], [address] Phone: [phone]   Fax:  [phone]  Name: Kiela Shisler MRN: [id number] Date of Birth: 1946-03-07

## 2018-03-22 ENCOUNTER — Encounter: Payer: Medicare HMO | Admitting: Rehabilitative and Restorative Service Providers"

## 2018-03-22 ENCOUNTER — Emergency Department
Admission: EM | Admit: 2018-03-22 | Discharge: 2018-03-22 | Disposition: A | Discharge status: Home / Self Care | Payer: Medicare HMO | Source: Home / Self Care

## 2018-03-22 ENCOUNTER — Other Ambulatory Visit: Payer: Self-pay

## 2018-03-22 ENCOUNTER — Emergency Department (INDEPENDENT_AMBULATORY_CARE_PROVIDER_SITE_OTHER): Payer: Medicare HMO

## 2018-03-22 ENCOUNTER — Encounter: Payer: Self-pay | Admitting: Family Medicine

## 2018-03-22 DIAGNOSIS — J40 Bronchitis, not specified as acute or chronic: Secondary | ICD-10-CM

## 2018-03-22 DIAGNOSIS — R0602 Shortness of breath: Secondary | ICD-10-CM | POA: Diagnosis not present

## 2018-03-22 DIAGNOSIS — R0789 Other chest pain: Secondary | ICD-10-CM

## 2018-03-22 DIAGNOSIS — R079 Chest pain, unspecified: Secondary | ICD-10-CM

## 2018-03-22 DIAGNOSIS — Z883 Allergy status to other anti-infective agents status: Secondary | ICD-10-CM | POA: Diagnosis not present

## 2018-03-22 DIAGNOSIS — J449 Chronic obstructive pulmonary disease, unspecified: Secondary | ICD-10-CM | POA: Diagnosis not present

## 2018-03-22 DIAGNOSIS — R9431 Abnormal electrocardiogram [ECG] [EKG]: Secondary | ICD-10-CM | POA: Diagnosis not present

## 2018-03-22 DIAGNOSIS — R05 Cough: Secondary | ICD-10-CM | POA: Diagnosis not present

## 2018-03-22 DIAGNOSIS — Z79899 Other long term (current) drug therapy: Secondary | ICD-10-CM | POA: Diagnosis not present

## 2018-03-22 DIAGNOSIS — Z87891 Personal history of nicotine dependence: Secondary | ICD-10-CM | POA: Diagnosis not present

## 2018-03-22 DIAGNOSIS — J209 Acute bronchitis, unspecified: Secondary | ICD-10-CM | POA: Diagnosis not present

## 2018-03-22 DIAGNOSIS — I10 Essential (primary) hypertension: Secondary | ICD-10-CM | POA: Diagnosis not present

## 2018-03-22 DIAGNOSIS — Z881 Allergy status to other antibiotic agents status: Secondary | ICD-10-CM | POA: Diagnosis not present

## 2018-03-22 DIAGNOSIS — E079 Disorder of thyroid, unspecified: Secondary | ICD-10-CM | POA: Diagnosis not present

## 2018-03-22 MED ORDER — AMOXICILLIN 875 MG PO TABS: 875.0000 mg | ORAL_TABLET | Freq: Two times a day (BID) | ORAL | 0 refills | Status: DC

## 2018-03-22 MED ORDER — PREDNISONE 20 MG PO TABS: ORAL_TABLET | ORAL | 1 refills | Status: DC

## 2018-03-22 NOTE — ED Provider Notes (Addendum)
[facility]   [id number] 03/22/18 Arrival Time: 1912   SUBJECTIVE:  Tiajuana Leppanen is a 72 y.o. female who presents to the urgent care with complaint of cough for two days.  Patient has a h/o COPD and was seen two weeks ago and started on antibiotics.  She seemed to be improving until yesterday.  Last night patient experienced several hours of shortness of breath and chest tightness radiating into the neck.  No diaphoresis or nausea.  The chest pain has subsided but the cough persists.  No pain with deep breath or cough now, but she has some anterior chest tightness.  No sore throat, fever, or cardiac history.  No calf tenderness or lower extremity swelling.     Past Medical History:  Diagnosis Date  . Anemia   . Anxiety   . Arthritis   . Asthma   . Cancer ([place])   . COPD (chronic obstructive pulmonary disease) ([place])   . Emphysema of lung ([place])   . GERD (gastroesophageal reflux disease)   . Hypertension   . Osteoporosis   . Thyroid disease    Family History  Problem Relation Age of Onset  . Stroke Other   . Emphysema Mother 27       Death cause was emphysema.  . Alcohol abuse Father   . COPD Sister   . Alcohol abuse Brother 35  . Cirrhosis Brother   . Cancer Maternal Grandmother 41       Stomach  . Stroke Maternal Grandfather 75   Social History   Socioeconomic History  . Marital status: Married    Spouse name: Not on file  . Number of children: Not on file  . Years of education: Not on file  . Highest education level: Not on file  Occupational History  . Not on file  Social Needs  . Financial resource strain: Not on file  . Food insecurity:    Worry: Not on file    Inability: Not on file  . Transportation needs:    Medical: Not on file    Non-medical: Not on file  Tobacco Use  . Smoking status: Former Smoker    Packs/day: 1.50    Years: 40.00    Pack years: 60.00    Last attempt to quit: 02/01/2008    Years since quitting: 10.1  .  Smokeless tobacco: Never Used  Substance and Sexual Activity  . Alcohol use: Yes    Comment: SOCIALLY  . Drug use: No  . Sexual activity: Yes  Lifestyle  . Physical activity:    Days per week: Not on file    Minutes per session: Not on file  . Stress: Not on file  Relationships  . Social connections:    Talks on phone: Not on file    Gets together: Not on file    Attends religious service: Not on file    Active member of club or organization: Not on file    Attends meetings of clubs or organizations: Not on file    Relationship status: Not on file  . Intimate partner violence:    Fear of current or ex partner: Not on file    Emotionally abused: Not on file    Physically abused: Not on file    Forced sexual activity: Not on file  Other Topics Concern  . Not on file  Social History Narrative   ** Merged History Encounter **       No outpatient medications have been  marked as taking for the 03/22/18 encounter [facility] Encounter).   Allergies  Allergen Reactions  . Clarithromycin Other (See Comments)    THRUSH  . Tetracyclines & Related     Other reaction(s): Other SERVER INDIGESTION  . Evista [Raloxifene]     Back pain  . Floxin [Ofloxacin] Hives  . Neosporin [Neomycin-Polymyxin-Gramicidin] Hives  . Other     ANTIBIOTIC OINT - UNSURE OF NAME       ROS: As per HPI, remainder of ROS negative.   OBJECTIVE:  Pulse ox on 01/15/2018 at [facility] was 92% Vitals:   03/22/18 1941  BP: 138/87  Pulse: (!) 115  Resp: 16  Temp: 99.2 F (37.3 C)  TempSrc: Oral  SpO2: 91%  Weight: 125 lb (56.7 kg)  Height: 5\' 1"  (1.549 m)     General appearance: alert; no distress Eyes: PERRL; EOMI; conjunctiva normal HENT: normocephalic; atraumatic; TMs normal, canal normal, external ears normal without trauma;  oral mucosa normal Neck: supple Lungs: expiratory wheezes on auscultation bilaterally Heart: regular rate and rhythm Abdomen: soft, non-tender; bowel sounds normal;  no masses or organomegaly; no guarding or rebound tenderness Back: no CVA tenderness Extremities: no cyanosis or edema; symmetrical with no gross deformities Skin: warm and dry Neurologic: normal gait; grossly normal Psychological: alert and cooperative; appears depressed.      Labs:  Results for orders placed or performed in visit on 02/26/18  CBC with Differential/Platelet  Result Value Ref Range   WBC 14.4 (H) 3.8 - 10.8 Thousand/uL   RBC 4.28 3.80 - 5.10 Million/uL   Hemoglobin 13.2 11.7 - 15.5 g/dL   HCT 38.6 35.0 - 45.0 %   MCV 90.2 80.0 - 100.0 fL   MCH 30.8 27.0 - 33.0 pg   MCHC 34.2 32.0 - 36.0 g/dL   RDW 11.6 11.0 - 15.0 %   Platelets 252 140 - 400 Thousand/uL   MPV 10.5 7.5 - 12.5 fL   Neutro Abs 11,419 (H) 1,500 - 7,800 cells/uL   Lymphs Abs 1,598 850 - 3,900 cells/uL   WBC mixed population 1,238 (H) 200 - 950 cells/uL   Eosinophils Absolute 115 15 - 500 cells/uL   Basophils Absolute 29 0 - 200 cells/uL   Neutrophils Relative % 79.3 %   Total Lymphocyte 11.1 %   Monocytes Relative 8.6 %   Eosinophils Relative 0.8 %   Basophils Relative 0.2 %  COMPLETE METABOLIC PANEL WITH GFR  Result Value Ref Range   Glucose, Bld 101 (H) 65 - 99 mg/dL   BUN 13 7 - 25 mg/dL   Creat 0.83 0.60 - 0.93 mg/dL   GFR, Est Non African American 71 > OR = 60 mL/min/1.48m2   GFR, Est African American 82 > OR = 60 mL/min/1.67m2   BUN/Creatinine Ratio NOT APPLICABLE 6 - 22 (calc)   Sodium 141 135 - 146 mmol/L   Potassium 3.9 3.5 - 5.3 mmol/L   Chloride 102 98 - 110 mmol/L   CO2 30 20 - 32 mmol/L   Calcium 9.5 8.6 - 10.4 mg/dL   Total Protein 6.8 6.1 - 8.1 g/dL   Albumin 4.0 3.6 - 5.1 g/dL   Globulin 2.8 1.9 - 3.7 g/dL (calc)   AG Ratio 1.4 1.0 - 2.5 (calc)   Total Bilirubin 0.5 0.2 - 1.2 mg/dL   Alkaline phosphatase (APISO) 75 33 - 130 U/L   AST 20 10 - 35 U/L   ALT 18 6 - 29 U/L  Fe+TIBC+Fer  Result Value Ref  Range   Iron 24 (L) 45 - 160 mcg/dL   TIBC 282 250 - 450 mcg/dL  (calc)   %SAT 9 (L) 11 - 50 % (calc)   Ferritin 126 20 - 288 ng/mL  T4, free  Result Value Ref Range   Free T4 0.8 0.8 - 1.8 ng/dL  TSH  Result Value Ref Range   TSH 3.95 0.40 - 4.50 mIU/L  T3, free  Result Value Ref Range   T3, Free 2.1 (L) 2.3 - 4.2 pg/mL    Labs Reviewed - No data to display  Dg Chest 2 View  Result Date: 03/22/2018 CLINICAL DATA:  Productive cough, shortness of breath and chest pain for 1 day. EXAM: CHEST - 2 VIEW COMPARISON:  02/26/2018 and prior chest radiographs FINDINGS: The cardiomediastinal silhouette is unremarkable. Mild peribronchial thickening again noted. There is no evidence of focal airspace disease, pulmonary edema, suspicious pulmonary nodule/mass, pleural effusion, or pneumothorax. No acute bony abnormalities are identified. RIGHT shoulder arthroplasty changes again noted. IMPRESSION: No active cardiopulmonary disease. Electronically Signed   By: Margarette Canada M.D.   On: 03/22/2018 19:57    Chest x-ray:  Increased interstitial markings without definite infiltrate. EKG:    ASSESSMENT & PLAN:  1. Bronchitis   2. Atypical chest pain   3. Nonspecific abnormal electrocardiogram (ECG) (EKG)   Because of the bout of chest pain last night and nonspecific change in the EKG this evening, you need to have a blood test to better evaluate for a blockage around the heart muscle.  You may drive to the emergency department since the pain has resolved for the past 12 hours.  Meds ordered this encounter  Medications  . amoxicillin (AMOXIL) 875 MG tablet    Sig: Take 1 tablet (875 mg total) by mouth 2 (two) times daily.    Dispense:  20 tablet    Refill:  0  . predniSONE (DELTASONE) 20 MG tablet    Sig: 2 daily with food    Dispense:  10 tablet    Refill:  1    Reviewed expectations re: course of current medical issues. Questions answered. Outlined signs and symptoms indicating need for more acute intervention. Patient verbalized understanding. After  Visit Summary given.     Robyn Haber, MD 03/22/18 Tomasa Hose    Robyn Haber, MD 03/22/18 2006

## 2018-03-22 NOTE — Discharge Instructions (Addendum)
Because of the bout of chest pain last night and nonspecific change in the EKG this evening, you need to have a blood test to better evaluate for a blockage around the heart muscle.  You may drive to the emergency department since the pain has resolved for the past 12 hours.

## 2018-03-22 NOTE — ED Triage Notes (Signed)
Pt c/o SOB since yesterday. Productive cough with green sputum and chest tightness.

## 2018-03-23 ENCOUNTER — Telehealth: Payer: Self-pay

## 2018-03-23 NOTE — Telephone Encounter (Signed)
Left message asking how she is feeling and for her to call back if she has questions or concerns.

## 2018-03-24 ENCOUNTER — Other Ambulatory Visit: Payer: Self-pay | Admitting: Osteopathic Medicine

## 2018-03-24 DIAGNOSIS — I10 Essential (primary) hypertension: Secondary | ICD-10-CM

## 2018-03-26 ENCOUNTER — Encounter: Payer: Medicare HMO | Admitting: Physical Therapy

## 2018-03-26 DIAGNOSIS — R911 Solitary pulmonary nodule: Secondary | ICD-10-CM | POA: Diagnosis not present

## 2018-03-26 DIAGNOSIS — J441 Chronic obstructive pulmonary disease with (acute) exacerbation: Secondary | ICD-10-CM | POA: Diagnosis not present

## 2018-03-26 DIAGNOSIS — I10 Essential (primary) hypertension: Secondary | ICD-10-CM | POA: Diagnosis not present

## 2018-03-27 ENCOUNTER — Ambulatory Visit: Payer: Medicare HMO | Admitting: Osteopathic Medicine

## 2018-03-28 ENCOUNTER — Encounter: Payer: Self-pay | Admitting: Rehabilitative and Restorative Service Providers"

## 2018-03-28 ENCOUNTER — Ambulatory Visit: Payer: Medicare HMO | Admitting: Rehabilitative and Restorative Service Providers"

## 2018-03-28 DIAGNOSIS — G8929 Other chronic pain: Secondary | ICD-10-CM

## 2018-03-28 DIAGNOSIS — M545 Low back pain, unspecified: Secondary | ICD-10-CM

## 2018-03-28 DIAGNOSIS — R29898 Other symptoms and signs involving the musculoskeletal system: Secondary | ICD-10-CM | POA: Diagnosis not present

## 2018-03-28 DIAGNOSIS — R531 Weakness: Secondary | ICD-10-CM | POA: Diagnosis not present

## 2018-03-28 NOTE — Therapy (Addendum)
[place], [name], [address] Phone: [phone]   Fax:  [phone]  Physical Therapy Treatment  Patient Details  Name: Margaret [name] MRN: [id number] Date of Birth: 1946/07/06 Referring Provider: Dr Ivan Croft   Encounter Date: 03/28/2018  PT End of Session - 03/28/18 1159    Visit Number  6    Number of Visits  18    Date for PT Re-Evaluation  04/23/18    PT Start Time  1102    PT Stop Time  1143    PT Time Calculation (min)  41 min    Activity Tolerance  Patient tolerated treatment well       Past Medical History:  Diagnosis Date  . Anemia   . Anxiety   . Arthritis   . Asthma   . Cancer ([place])   . COPD (chronic obstructive pulmonary disease) ([place])   . Emphysema of lung ([place])   . GERD (gastroesophageal reflux disease)   . Hypertension   . Osteoporosis   . Thyroid disease     Past Surgical History:  Procedure Laterality Date  . ABDOMINAL HYSTERECTOMY    . BREAST SURGERY    . CESAREAN SECTION    . JOINT REPLACEMENT     LT KNEE  . TOTAL KNEE ARTHROPLASTY  2006    There were no vitals filed for this visit.  Subjective Assessment - 03/28/18 1110    Subjective  Feels better - has been sick but is feeling better now. Back still hurts.     Currently in Pain?  Yes    Pain Score  6     Pain Location  Back    Pain Orientation  Mid;Lower    Pain Descriptors / Indicators  Aching;Sore    Pain Type  Chronic pain    Pain Onset  More than a month ago    Pain Frequency  Constant    Aggravating Factors   bending; twisting    Pain Relieving Factors  bengay; medicine          [facility] PT Assessment - 03/28/18 0001      Assessment   Medical Diagnosis  LBP     Referring Provider  Dr Ivan Croft    Onset Date/Surgical Date  01/05/18    Hand Dominance  Right    Next MD Visit  04/24/18      AROM   Lumbar Flexion  20%    Lumbar Extension  5%    Lumbar - Right Side Bend  55%    Lumbar - Left Side Bend  50%    Lumbar - Right Rotation  40%    Lumbar - Left Rotation  40%      Flexibility   Hamstrings  tight Rt 75 deg; Lt 75 deg     Piriformis  tightness Rt > Lt                    [facility] Adult PT Treatment/Exercise - 03/28/18 0001      Self-Care   Self-Care  -- continue with education re- transitional movements/back care      Lumbar Exercises: Stretches   Passive Hamstring Stretch  Right;Left;2 reps 45 seconds, supine with strap    Single Knee to Chest Stretch  2 reps;30 seconds    Piriformis Stretch  Right;2 reps;30 seconds supine travell     Other Lumbar Stretch Exercise  Meeks leg lengthener / leg press 5s x10 reps,  both sides      Lumbar Exercises: Aerobic   Nustep  L5 (arms 12) - x 7 min       Lumbar Exercises: Standing   Functional Squats  10 reps 2 sets     Wall Slides  20 reps orange ball along wall       Lumbar Exercises: Supine   AB Set Limitations  3 part core 10 sec x 10  encouraging core with exercise    Clam  20 reps with 3 part core alternating legs, 2s pause; green band    Dead Bug  10 reps core engaged     Other Supine Lumbar Exercises  diagonal UE red TB x 10x 2 sets each UE - core engaged     Other Supine Lumbar Exercises  shoulder flex/ext x 15 each UE - arms at 90 deg - shd toward table and back to 90 deg alternating UE's with core engaged throughout added 2# pounds       Modalities   Modalities  -- pt declined due to schedule                   PT Long Term Goals - 03/28/18 1106      PT LONG TERM GOAL #1   Title  Improve core stability allowing patient to increase function in sitting, standing and walking by 10-20 min each 04/23/18    Time  6    Period  Weeks    Status  On-going      PT LONG TERM GOAL #2   Title  Increase LT strength to 5-/5 to 5/5 throughout 04/23/18    Time  6    Period  Weeks    Status  On-going      PT LONG TERM GOAL #3   Title  Improve trunk mobility in al planes by 5-10%  allowing patient to more easily perform functional activities such as putting on her shoes 04/23/18    Time  6    Period  Weeks    Status  On-going      PT LONG TERM GOAL #4   Title  independent in HEP 04/23/18    Time  6    Period  Weeks    Status  On-going      PT LONG TERM GOAL #5   Title  Improve FOTO to </= 47% limitation 04/23/18    Time  6    Period  Weeks    Status  On-going            Plan - 03/28/18 1112    Clinical Impression Statement  Continued pain. Patient works well on exercises in the clinic. Continues to require verbal cues for proper body mechanics. Core strengthening limited by pain.     Rehab Potential  Good    PT Frequency  3x / week    PT Duration  6 weeks    PT Treatment/Interventions  Patient/family education;ADLs/Self Care Home Management;Cryotherapy;Electrical Stimulation;Iontophoresis 10m/ml Dexamethasone;Moist Heat;Ultrasound;Dry needling;Manual techniques;Neuromuscular re-education;Therapeutic activities;Therapeutic exercise    PT Next Visit Plan  core stabilization; LE strengthening; education in back care and proper body mechanics; manual work/modalities as indicated        Patient will benefit from skilled therapeutic intervention in order to improve the following deficits and impairments:  Improper body mechanics, Postural dysfunction, Decreased mobility, Decreased range of motion, Decreased strength, Decreased activity tolerance  Visit Diagnosis: Chronic bilateral low back pain without sciatica  Weakness generalized  Other  symptoms and signs involving the musculoskeletal system     Problem List Patient Active Problem List   Diagnosis Date Noted  . H/O total knee replacement, left 10/23/2017  . Iron deficiency anemia 05/31/2017  . Pulmonary nodules 12/26/2016  . Shoulder pain, right 12/25/2016  . Other chronic pain 11/03/2016  . Thyroid nodule 03/30/2016  . Solitary pulmonary nodule 03/30/2016  . Strain of gluteus medius  10/07/2015  . Depression with anxiety 09/29/2015  . Hip pain, acute 08/31/2015  . Vitamin D insufficiency 04/08/2015  . Elevated vitamin B12 level 04/08/2015  . Hirsutism 01/28/2015  . Osteoarthritis of right knee 11/04/2014  . Pain in right hip 08/21/2014  . Hair loss 08/21/2014  . Hypothyroidism 07/24/2014  . Vasomotor flushing 03/05/2014  . Intrinsic asthma 12/17/2013  . Spinal stenosis of lumbar region 10/14/2013  . Coffee ground emesis 03/06/2013  . Disorder of bursae and tendons in shoulder region 03/01/2013  . Anal fissure 02/22/2013  . Diverticulosis of colon without hemorrhage 02/22/2013  . Hyperglycemia 02/01/2013  . Essential hypertension, benign 01/31/2013  . CAP (community acquired pneumonia) 01/31/2013  . Osteoporosis 01/31/2013  . HSV infection 01/31/2013    Silvia Hightower Nilda Simmer PT, MPH  03/28/2018, 12:01 PM  [facility] [place], [name], [address] Phone: [phone]   Fax:  [phone]  Name: Margaret [name] MRN: [id number] Date of Birth: August 19, 1946  PHYSICAL THERAPY DISCHARGE SUMMARY  Visits from Start of Care: 6  Current functional level related to goals / functional outcomes: See last progress note for discharge status    Remaining deficits: Continued pain and limited functional abilities with no improvement with PT and HEP    Education / Equipment: HEP  Plan: Patient agrees to discharge.  Patient goals were not met. Patient is being discharged due to                                                     ?????   Patient is returning to surgeon and will proceed with lumbar fusion as scheduled.  Margaret [name] P. Helene Kelp PT, MPH 04/18/18 1:03 PM

## 2018-03-30 ENCOUNTER — Encounter: Payer: Medicare HMO | Admitting: Physical Therapy

## 2018-04-02 ENCOUNTER — Encounter: Payer: Medicare HMO | Admitting: Rehabilitative and Restorative Service Providers"

## 2018-04-04 ENCOUNTER — Encounter: Payer: Medicare HMO | Admitting: Rehabilitative and Restorative Service Providers"

## 2018-04-05 ENCOUNTER — Other Ambulatory Visit: Payer: Self-pay | Admitting: Osteopathic Medicine

## 2018-04-05 DIAGNOSIS — M4316 Spondylolisthesis, lumbar region: Secondary | ICD-10-CM | POA: Diagnosis not present

## 2018-04-05 DIAGNOSIS — I10 Essential (primary) hypertension: Secondary | ICD-10-CM

## 2018-04-05 DIAGNOSIS — M545 Low back pain: Secondary | ICD-10-CM | POA: Diagnosis not present

## 2018-04-05 DIAGNOSIS — G894 Chronic pain syndrome: Secondary | ICD-10-CM | POA: Diagnosis not present

## 2018-04-05 DIAGNOSIS — M25551 Pain in right hip: Secondary | ICD-10-CM | POA: Diagnosis not present

## 2018-04-06 ENCOUNTER — Encounter: Payer: Medicare HMO | Admitting: Physical Therapy

## 2018-04-09 ENCOUNTER — Encounter: Payer: Medicare HMO | Admitting: Rehabilitative and Restorative Service Providers"

## 2018-04-11 ENCOUNTER — Encounter: Payer: Medicare HMO | Admitting: Rehabilitative and Restorative Service Providers"

## 2018-04-13 ENCOUNTER — Encounter: Payer: Medicare HMO | Admitting: Physical Therapy

## 2018-04-16 ENCOUNTER — Encounter: Payer: Medicare HMO | Admitting: Rehabilitative and Restorative Service Providers"

## 2018-04-16 DIAGNOSIS — R911 Solitary pulmonary nodule: Secondary | ICD-10-CM | POA: Diagnosis not present

## 2018-04-16 DIAGNOSIS — J479 Bronchiectasis, uncomplicated: Secondary | ICD-10-CM | POA: Diagnosis not present

## 2018-04-16 DIAGNOSIS — K449 Diaphragmatic hernia without obstruction or gangrene: Secondary | ICD-10-CM | POA: Diagnosis not present

## 2018-04-16 DIAGNOSIS — J432 Centrilobular emphysema: Secondary | ICD-10-CM | POA: Diagnosis not present

## 2018-04-17 DIAGNOSIS — H2513 Age-related nuclear cataract, bilateral: Secondary | ICD-10-CM | POA: Diagnosis not present

## 2018-04-18 ENCOUNTER — Encounter: Payer: Medicare HMO | Admitting: Rehabilitative and Restorative Service Providers"

## 2018-04-20 ENCOUNTER — Encounter: Payer: Medicare HMO | Admitting: Physical Therapy

## 2018-04-23 ENCOUNTER — Encounter: Payer: Self-pay | Admitting: Sports Medicine

## 2018-04-23 ENCOUNTER — Encounter: Payer: Medicare HMO | Admitting: Physical Therapy

## 2018-04-23 ENCOUNTER — Ambulatory Visit (INDEPENDENT_AMBULATORY_CARE_PROVIDER_SITE_OTHER): Payer: Medicare HMO | Admitting: Sports Medicine

## 2018-04-23 VITALS — BP 116/69 | HR 73 | Resp 16 | Wt 129.0 lb

## 2018-04-23 DIAGNOSIS — R300 Dysuria: Secondary | ICD-10-CM

## 2018-04-23 DIAGNOSIS — N3000 Acute cystitis without hematuria: Secondary | ICD-10-CM | POA: Insufficient documentation

## 2018-04-23 DIAGNOSIS — N3001 Acute cystitis with hematuria: Secondary | ICD-10-CM

## 2018-04-23 LAB — POCT URINALYSIS DIPSTICK
Bilirubin, UA: NEGATIVE
Glucose, UA: NEGATIVE
Ketones, UA: NEGATIVE
Nitrite, UA: POSITIVE
Protein, UA: NEGATIVE
Spec Grav, UA: 1.01 (ref 1.010–1.025)
Urobilinogen, UA: 0.2 E.U./dL
pH, UA: 6 (ref 5.0–8.0)

## 2018-04-23 MED ORDER — NITROFURANTOIN MONOHYD MACRO 100 MG PO CAPS: 100.0000 mg | ORAL_CAPSULE | Freq: Two times a day (BID) | ORAL | 0 refills | Status: DC

## 2018-04-23 NOTE — Addendum Note (Signed)
Addended by: Elizabeth Sauer on: 04/23/2018 11:56 AM   Modules accepted: Orders

## 2018-04-23 NOTE — Patient Instructions (Signed)

## 2018-04-23 NOTE — Assessment & Plan Note (Signed)
Comp located acute cystitis with hematuria. Urine culture, Macrobid. Return if no better in a week or 2.

## 2018-04-23 NOTE — Progress Notes (Signed)
Subjective:    CC: Urinary tract infection  HPI: This is a pleasant 72 year old female, for the past several days she said increasing urinary urgency, frequency, dysuria, odor.  No fevers or chills, no GI symptoms, no flank pain.  Symptoms are moderate, persistent.  I reviewed the past medical history, family history, social history, surgical history, and allergies today and no changes were needed.  Please see the problem list section below in epic for further details.  Past Medical History: Past Medical History:  Diagnosis Date  . Anemia   . Anxiety   . Arthritis   . Asthma   . Cancer ([place])   . COPD (chronic obstructive pulmonary disease) ([place])   . Emphysema of lung ([place])   . GERD (gastroesophageal reflux disease)   . Hypertension   . Osteoporosis   . Thyroid disease    Past Surgical History: Past Surgical History:  Procedure Laterality Date  . ABDOMINAL HYSTERECTOMY    . BREAST SURGERY    . CESAREAN SECTION    . JOINT REPLACEMENT     LT KNEE  . TOTAL KNEE ARTHROPLASTY  2006   Social History: Social History   Socioeconomic History  . Marital status: Married    Spouse name: Not on file  . Number of children: Not on file  . Years of education: Not on file  . Highest education level: Not on file  Occupational History  . Not on file  Social Needs  . Financial resource strain: Not on file  . Food insecurity:    Worry: Not on file    Inability: Not on file  . Transportation needs:    Medical: Not on file    Non-medical: Not on file  Tobacco Use  . Smoking status: Former Smoker    Packs/day: 1.50    Years: 40.00    Pack years: 60.00    Last attempt to quit: 02/01/2008    Years since quitting: 10.2  . Smokeless tobacco: Never Used  Substance and Sexual Activity  . Alcohol use: Yes    Comment: SOCIALLY  . Drug use: No  . Sexual activity: Yes  Lifestyle  . Physical activity:    Days per week: Not on file    Minutes per session: Not on file  . Stress: Not  on file  Relationships  . Social connections:    Talks on phone: Not on file    Gets together: Not on file    Attends religious service: Not on file    Active member of club or organization: Not on file    Attends meetings of clubs or organizations: Not on file    Relationship status: Not on file  Other Topics Concern  . Not on file  Social History Narrative   ** Merged History Encounter **       Family History: Family History  Problem Relation Age of Onset  . Stroke Other   . Emphysema Mother 54       Death cause was emphysema.  . Alcohol abuse Father   . COPD Sister   . Alcohol abuse Brother 71  . Cirrhosis Brother   . Cancer Maternal Grandmother 23       Stomach  . Stroke Maternal Grandfather 75   Allergies: Allergies  Allergen Reactions  . Clarithromycin Other (See Comments)    THRUSH  . Tetracyclines & Related     Other reaction(s): Other SERVER INDIGESTION  . Evista [Raloxifene]     Back pain  .  Floxin [Ofloxacin] Hives  . Neosporin [Neomycin-Polymyxin-Gramicidin] Hives  . Other     ANTIBIOTIC OINT - UNSURE OF NAME    Medications: See med rec.  Review of Systems: No fevers, chills, night sweats, weight loss, chest pain, or shortness of breath.   Objective:    General: Well Developed, well nourished, and in no acute distress.  Neuro: Alert and oriented x3, extra-ocular muscles intact, sensation grossly intact.  HEENT: Normocephalic, atraumatic, pupils equal round reactive to light, neck supple, no masses, no lymphadenopathy, thyroid nonpalpable.  Skin: Warm and dry, no rashes. Cardiac: Regular rate and rhythm, no murmurs rubs or gallops, no lower extremity edema.  Respiratory: Clear to auscultation bilaterally. Not using accessory muscles, speaking in full sentences. Abdomen: Soft, minimally tender in suprapubic region, nondistended, no bowel sounds, no palpable masses, no guarding, rigidity, rebound tenderness, no costovertebral angle pain.  Urinalysis  is positive for blood and leukocytes.  Impression and Recommendations:    Acute cystitis Comp located acute cystitis with hematuria. Urine culture, Macrobid. Return if no better in a week or 2. ___________________________________________ Gwen Her. Dianah Field, M.D., ABFM., CAQSM. Primary Care and [employer] Instructor of [place] [facility]

## 2018-04-26 DIAGNOSIS — M5126 Other intervertebral disc displacement, lumbar region: Secondary | ICD-10-CM | POA: Diagnosis not present

## 2018-04-26 DIAGNOSIS — I700 Atherosclerosis of aorta: Secondary | ICD-10-CM | POA: Diagnosis not present

## 2018-04-26 DIAGNOSIS — Z981 Arthrodesis status: Secondary | ICD-10-CM | POA: Diagnosis not present

## 2018-04-26 DIAGNOSIS — M4726 Other spondylosis with radiculopathy, lumbar region: Secondary | ICD-10-CM | POA: Diagnosis not present

## 2018-04-26 LAB — URINE CULTURE
MICRO NUMBER:: 90611018
SPECIMEN QUALITY:: ADEQUATE

## 2018-05-04 ENCOUNTER — Other Ambulatory Visit: Payer: Self-pay | Admitting: Osteopathic Medicine

## 2018-05-04 DIAGNOSIS — I10 Essential (primary) hypertension: Secondary | ICD-10-CM

## 2018-05-10 DIAGNOSIS — M5431 Sciatica, right side: Secondary | ICD-10-CM | POA: Diagnosis not present

## 2018-05-10 DIAGNOSIS — M961 Postlaminectomy syndrome, not elsewhere classified: Secondary | ICD-10-CM | POA: Diagnosis not present

## 2018-05-10 DIAGNOSIS — M5432 Sciatica, left side: Secondary | ICD-10-CM | POA: Diagnosis not present

## 2018-05-10 DIAGNOSIS — M4726 Other spondylosis with radiculopathy, lumbar region: Secondary | ICD-10-CM | POA: Diagnosis not present

## 2018-05-15 ENCOUNTER — Other Ambulatory Visit: Payer: Self-pay | Admitting: Osteopathic Medicine

## 2018-05-15 DIAGNOSIS — Z1231 Encounter for screening mammogram for malignant neoplasm of breast: Secondary | ICD-10-CM

## 2018-05-16 ENCOUNTER — Ambulatory Visit (INDEPENDENT_AMBULATORY_CARE_PROVIDER_SITE_OTHER): Payer: Medicare HMO

## 2018-05-16 DIAGNOSIS — Z1231 Encounter for screening mammogram for malignant neoplasm of breast: Secondary | ICD-10-CM | POA: Diagnosis not present

## 2018-05-21 ENCOUNTER — Ambulatory Visit: Payer: Medicare HMO | Admitting: Certified Nurse Midwife

## 2018-05-21 ENCOUNTER — Telehealth: Payer: Self-pay | Admitting: Osteopathic Medicine

## 2018-05-21 ENCOUNTER — Encounter: Payer: Self-pay | Admitting: Certified Nurse Midwife

## 2018-05-21 VITALS — BP 117/74 | HR 72 | Resp 16 | Ht 61.0 in | Wt 130.0 lb

## 2018-05-21 DIAGNOSIS — B373 Candidiasis of vulva and vagina: Secondary | ICD-10-CM | POA: Diagnosis not present

## 2018-05-21 DIAGNOSIS — N898 Other specified noninflammatory disorders of vagina: Secondary | ICD-10-CM

## 2018-05-21 DIAGNOSIS — B3731 Acute candidiasis of vulva and vagina: Secondary | ICD-10-CM

## 2018-05-21 MED ORDER — LIDOCAINE HCL URETHRAL/MUCOSAL 2 % EX GEL: 1.0000 "application " | CUTANEOUS | 0 refills | Status: DC | PRN

## 2018-05-21 MED ORDER — FLUCONAZOLE 150 MG PO TABS: 150.0000 mg | ORAL_TABLET | Freq: Every day | ORAL | 1 refills | Status: DC

## 2018-05-21 NOTE — Progress Notes (Signed)
GYNECOLOGY ANNUAL PREVENTATIVE CARE ENCOUNTER NOTE  Subjective:   Margaret [name] is a 72 y.o. [id number] female here for a routine annual gynecologic exam.  Current complaints: vaginal irritation and discharge. She reports symptoms started 1 week ago, she has tried [employer] and vagisil for irritation but itching has continued. She reports not having IC and knows it is not an STD. Denies abnormal vaginal bleeding, pelvic pain, problems with intercourse or other gynecologic concerns.    Gynecologic History No LMP recorded. Patient has had a hysterectomy.  Obstetric History OB History  Gravida Para Term Preterm AB Living  2 2   1   1   SAB TAB Ectopic Multiple Live Births          1    # Outcome Date GA Lbr Len/2nd Weight Sex Delivery Anes PTL Lv  2 Para           1 Preterm             Past Medical History:  Diagnosis Date  . Anemia   . Anxiety   . Arthritis   . Asthma   . Cancer ([place])   . COPD (chronic obstructive pulmonary disease) ([place])   . Emphysema of lung ([place])   . GERD (gastroesophageal reflux disease)   . Hypertension   . Osteoporosis   . Thyroid disease     Past Surgical History:  Procedure Laterality Date  . ABDOMINAL HYSTERECTOMY    . BREAST SURGERY    . CESAREAN SECTION    . JOINT REPLACEMENT     LT KNEE  . TOTAL KNEE ARTHROPLASTY  2006    Current Outpatient Medications on File Prior to Visit  Medication Sig Dispense Refill  . albuterol (PROVENTIL HFA;VENTOLIN HFA) 108 (90 Base) MCG/ACT inhaler Inhale 1-2 puffs into the lungs every 4 (four) hours as needed for wheezing. 1 Inhaler 0  . amLODipine (NORVASC) 10 MG tablet TAKE 1 TABLET BY MOUTH EVERY DAY 90 tablet 0  . ferrous sulfate 325 (65 FE) MG EC tablet Take by mouth.    Cristy Friedlander HFA 220 MCG/ACT inhaler     . FLUoxetine (PROZAC) 20 MG capsule Take 1 capsule (20 mg total) by mouth daily. 90 capsule 1  . losartan (COZAAR) 100 MG tablet TAKE 1 TABLET (100 MG TOTAL) BY MOUTH DAILY. PT NEEDS TO MAKE A F/U  APPT W/PCP. 30 tablet 0  . montelukast (SINGULAIR) 10 MG tablet Take 10 mg by mouth daily.  2  . omeprazole (PRILOSEC) 20 MG capsule TAKE 1 CAPSULE BY MOUTH EVERY DAY    . pregabalin (LYRICA) 150 MG capsule Take by mouth.    . thyroid (ARMOUR THYROID) 30 MG tablet Take 1 tablet (30 mg total) by mouth daily before breakfast. Repeat labs when these pills are running low 90 tablet 3  . Tiotropium Bromide-Olodaterol 2.5-2.5 MCG/ACT AERS Inhale 2 puffs into the lungs daily.    . pregabalin (LYRICA) 75 MG capsule Take 1 capsule by mouth 3 (three) times daily.     No current facility-administered medications on file prior to visit.     Allergies  Allergen Reactions  . Clarithromycin Other (See Comments)    THRUSH  . Tetracyclines & Related     Other reaction(s): Other SERVER INDIGESTION  . Evista [Raloxifene]     Back pain  . Floxin [Ofloxacin] Hives  . Neosporin [Neomycin-Polymyxin-Gramicidin] Hives  . Other     ANTIBIOTIC OINT - UNSURE OF NAME     Social  History   Socioeconomic History  . Marital status: Married    Spouse name: Not on file  . Number of children: Not on file  . Years of education: Not on file  . Highest education level: Not on file  Occupational History  . Not on file  Social Needs  . Financial resource strain: Not on file  . Food insecurity:    Worry: Not on file    Inability: Not on file  . Transportation needs:    Medical: Not on file    Non-medical: Not on file  Tobacco Use  . Smoking status: Former Smoker    Packs/day: 1.50    Years: 40.00    Pack years: 60.00    Last attempt to quit: 02/01/2008    Years since quitting: 10.3  . Smokeless tobacco: Never Used  Substance and Sexual Activity  . Alcohol use: Yes    Comment: SOCIALLY  . Drug use: No  . Sexual activity: Yes    Birth control/protection: None  Lifestyle  . Physical activity:    Days per week: Not on file    Minutes per session: Not on file  . Stress: Not on file  Relationships  .  Social connections:    Talks on phone: Not on file    Gets together: Not on file    Attends religious service: Not on file    Active member of club or organization: Not on file    Attends meetings of clubs or organizations: Not on file    Relationship status: Not on file  . Intimate partner violence:    Fear of current or ex partner: Not on file    Emotionally abused: Not on file    Physically abused: Not on file    Forced sexual activity: Not on file  Other Topics Concern  . Not on file  Social History Narrative   ** Merged History Encounter **        Family History  Problem Relation Age of Onset  . Stroke Other   . Emphysema Mother 2       Death cause was emphysema.  . Alcohol abuse Father   . COPD Sister   . Alcohol abuse Brother 34  . Cirrhosis Brother   . Cancer Maternal Grandmother 21       Stomach  . Stroke Maternal Grandfather 75    The following portions of the patient's history were reviewed and updated as appropriate: allergies, current medications, past family history, past medical history, past social history, past surgical history and problem list.  Review of Systems Pertinent items noted in HPI and remainder of comprehensive ROS otherwise negative.   Objective:  BP 117/74   Pulse 72   Resp 16   Ht 5\' 1"  (1.549 m)   Wt 130 lb (59 kg)   BMI 24.56 kg/m  CONSTITUTIONAL: Well-developed, well-nourished female in no acute distress.  HENT:  Normocephalic, atraumatic, External right and left ear normal. Oropharynx is clear and moist EYES: Conjunctivae and EOM are normal. Pupils are equal, round, and reactive to light. SKIN: Skin is warm and dry. No rash noted. Not diaphoretic. No erythema. No pallor. MUSCULOSKELETAL: Normal range of motion. No tenderness.  No cyanosis, clubbing, or edema.  2+ distal pulses. NEUROLOGIC: Alert and oriented to person, place, and time. Normal reflexes, muscle tone coordination. No cranial nerve deficit noted. PSYCHIATRIC: Normal  mood and affect. Normal behavior. Normal judgment and thought content. CARDIOVASCULAR: Normal heart rate noted, regular rhythm  RESPIRATORY: Clear to auscultation bilaterally. Effort and breath sounds normal, no problems with respiration noted. ABDOMEN: Soft, normal bowel sounds, no distention noted.  No tenderness, rebound or guarding.  PELVIC: Erythema and edema noted on labia majora, white curdy discharge noted at vaginal introitus.   Assessment and Plan:  1. Vaginal yeast infection -Will treat vaginal yeast infection with oral medication due to patient trying two vaginal methods with no relief of symptoms  - fluconazole (DIFLUCAN) 150 MG tablet; Take 1 tablet (150 mg total) by mouth daily.  Dispense: 1 tablet; Refill: 1 - Wet prep, genital  2. Vaginal irritation -Patient reports she started using a different soap last week, reports soaps usual break her out. Has since switched back to her normal soap. Lidocaine jelly prescribed for irritation until erythema heals - lidocaine (XYLOCAINE) 2 % jelly; Apply 1 application topically as needed.  Dispense: 30 mL; Refill: 0  Routine preventative health maintenance measures emphasized. Please refer to After Visit Summary for other counseling recommendations.   Lajean Manes, CNM 05/21/18, 11:54 AM

## 2018-05-21 NOTE — Addendum Note (Signed)
Addended by: Sherryle Lis L on: 05/21/2018 12:00 PM   Modules accepted: Orders

## 2018-05-21 NOTE — Patient Instructions (Signed)

## 2018-05-22 ENCOUNTER — Encounter (INDEPENDENT_AMBULATORY_CARE_PROVIDER_SITE_OTHER): Payer: Self-pay

## 2018-05-22 LAB — WET PREP FOR TRICH, YEAST, CLUE
MICRO NUMBER:: 90724089
Specimen Quality: ADEQUATE

## 2018-05-26 ENCOUNTER — Emergency Department (INDEPENDENT_AMBULATORY_CARE_PROVIDER_SITE_OTHER)
Admission: EM | Admit: 2018-05-26 | Discharge: 2018-05-26 | Disposition: A | Discharge status: Home / Self Care | Payer: Medicare HMO | Source: Home / Self Care | Attending: Family Medicine | Admitting: Family Medicine

## 2018-05-26 ENCOUNTER — Other Ambulatory Visit: Payer: Self-pay

## 2018-05-26 DIAGNOSIS — Z23 Encounter for immunization: Secondary | ICD-10-CM | POA: Diagnosis not present

## 2018-05-26 DIAGNOSIS — S61211A Laceration without foreign body of left index finger without damage to nail, initial encounter: Secondary | ICD-10-CM

## 2018-05-26 MED ORDER — TETANUS-DIPHTH-ACELL PERTUSSIS 5-2.5-18.5 LF-MCG/0.5 IM SUSP
0.5000 mL | Freq: Once | INTRAMUSCULAR | Status: AC
  Administered 2018-05-26: 0.5 mL via INTRAMUSCULAR

## 2018-05-26 NOTE — Discharge Instructions (Addendum)
Change dressing daily and keep wound clean and dry.  Return for any signs of infection (or follow-up with family doctor):  Increasing redness, swelling, pain, heat, drainage, etc. Return in 1 week for suture removal.

## 2018-05-26 NOTE — ED Triage Notes (Signed)
Pt cut finger on some gardening shears while pruning some vines. Bleeding has stopped. Pain level 3. Had tetnus vaccine 7 years ago.

## 2018-05-26 NOTE — ED Provider Notes (Signed)
[name]    CSN: [id number] Arrival date & time: 05/26/18  1656     History   Chief Complaint Chief Complaint  Patient presents with  . Laceration    left pointer finger    HPI Margaret [name] is a 72 y.o. female.   While trimming a wisteria vine 2 hours ago with hand shears, patient lacerated her left second finger.  Her last Tdap was about 7 years ago.  The history is provided by the patient.  Laceration  Location:  Finger Finger laceration location:  L index finger Length:  1.5cm Depth:  Through dermis Quality: straight   Bleeding: controlled   Time since incident:  2 hours Laceration mechanism:  Metal edge Pain details:    Quality:  Aching   Severity:  Mild   Timing:  Constant   Progression:  Improving Foreign body present:  No foreign bodies Worsened by:  Movement Ineffective treatments:  None tried Tetanus status:  Out of date Associated symptoms: no swelling     Past Medical History:  Diagnosis Date  . Anemia   . Anxiety   . Arthritis   . Asthma   . Cancer ([place])   . COPD (chronic obstructive pulmonary disease) ([place])   . Emphysema of lung ([place])   . GERD (gastroesophageal reflux disease)   . Hypertension   . Osteoporosis   . Thyroid disease     Patient Active Problem List   Diagnosis Date Noted  . Acute cystitis 04/23/2018  . H/O total knee replacement, left 10/23/2017  . Iron deficiency anemia 05/31/2017  . Pulmonary nodules 12/26/2016  . Shoulder pain, right 12/25/2016  . Other chronic pain 11/03/2016  . Thyroid nodule 03/30/2016  . Solitary pulmonary nodule 03/30/2016  . Strain of gluteus medius 10/07/2015  . Depression with anxiety 09/29/2015  . Hip pain, acute 08/31/2015  . Vitamin D insufficiency 04/08/2015  . Elevated vitamin B12 level 04/08/2015  . Hirsutism 01/28/2015  . Osteoarthritis of right knee 11/04/2014  . Pain in right hip 08/21/2014  . Hair loss 08/21/2014  . Hypothyroidism 07/24/2014  . Vasomotor  flushing 03/05/2014  . Intrinsic asthma 12/17/2013  . Spinal stenosis of lumbar region 10/14/2013  . Coffee ground emesis 03/06/2013  . Disorder of bursae and tendons in shoulder region 03/01/2013  . Anal fissure 02/22/2013  . Diverticulosis of colon without hemorrhage 02/22/2013  . Hyperglycemia 02/01/2013  . Essential hypertension, benign 01/31/2013  . CAP (community acquired pneumonia) 01/31/2013  . Osteoporosis 01/31/2013  . HSV infection 01/31/2013    Past Surgical History:  Procedure Laterality Date  . ABDOMINAL HYSTERECTOMY    . BREAST SURGERY    . CESAREAN SECTION    . JOINT REPLACEMENT     LT KNEE  . TOTAL KNEE ARTHROPLASTY  2006    OB History    Gravida  2   Para  2   Term      Preterm  1   AB      Living  1     SAB      TAB      Ectopic      Multiple      Live Births  1            Home Medications    Prior to Admission medications   Medication Sig Start Date End Date Taking? Authorizing Provider  albuterol (PROVENTIL HFA;VENTOLIN HFA) 108 (90 Base) MCG/ACT inhaler Inhale 1-2 puffs into the lungs every 4 (four) hours  as needed for wheezing. 12/26/16   Trixie Dredge, PA-C  amLODipine (NORVASC) 10 MG tablet TAKE 1 TABLET BY MOUTH EVERY DAY 03/26/18   Emeterio Reeve, DO  ferrous sulfate 325 (65 FE) MG EC tablet Take by mouth. 05/31/17   [provider]  FLOVENT HFA 220 MCG/ACT inhaler  02/13/18   [provider]  fluconazole (DIFLUCAN) 150 MG tablet Take 1 tablet (150 mg total) by mouth daily. 05/21/18   Lajean Manes, CNM  FLUoxetine (PROZAC) 20 MG capsule Take 1 capsule (20 mg total) by mouth daily. 01/08/18   Emeterio Reeve, DO  lidocaine (XYLOCAINE) 2 % jelly Apply 1 application topically as needed. 05/21/18   Lajean Manes, CNM  losartan (COZAAR) 100 MG tablet TAKE 1 TABLET (100 MG TOTAL) BY MOUTH DAILY. PT NEEDS TO MAKE A F/U APPT W/PCP. 05/04/18   Emeterio Reeve, DO  montelukast (SINGULAIR) 10  MG tablet Take 10 mg by mouth daily. 01/03/17   [provider]  omeprazole (PRILOSEC) 20 MG capsule TAKE 1 CAPSULE BY MOUTH EVERY DAY 03/05/18   [provider]  pregabalin (LYRICA) 150 MG capsule Take by mouth. 04/05/18 06/04/18  [provider]  pregabalin (LYRICA) 75 MG capsule Take 1 capsule by mouth 3 (three) times daily. 02/14/18 04/15/18  [provider]  thyroid (ARMOUR THYROID) 30 MG tablet Take 1 tablet (30 mg total) by mouth daily before breakfast. Repeat labs when these pills are running low 11/23/17   Emeterio Reeve, DO  Tiotropium Bromide-Olodaterol 2.5-2.5 MCG/ACT AERS Inhale 2 puffs into the lungs daily. 10/10/17   [provider]    Family History Family History  Problem Relation Age of Onset  . Stroke Other   . Emphysema Mother 29       Death cause was emphysema.  . Alcohol abuse Father   . COPD Sister   . Alcohol abuse Brother 49  . Cirrhosis Brother   . Cancer Maternal Grandmother 25       Stomach  . Stroke Maternal Grandfather 75    Social History Social History   Tobacco Use  . Smoking status: Former Smoker    Packs/day: 1.50    Years: 40.00    Pack years: 60.00    Last attempt to quit: 02/01/2008    Years since quitting: 10.3  . Smokeless tobacco: Never Used  Substance Use Topics  . Alcohol use: Yes    Comment: SOCIALLY  . Drug use: No     Allergies   Clarithromycin; Tetracyclines & related; Evista [raloxifene]; Floxin [ofloxacin]; Neosporin [neomycin-polymyxin-gramicidin]; and Other   Review of Systems Review of Systems  All other systems reviewed and are negative.    Physical Exam Triage Vital Signs ED Triage Vitals [05/26/18 1728]  Enc Vitals Group     BP 133/87     Pulse Rate 72     Resp      Temp      Temp src      SpO2 94 %     Weight      Height      Head Circumference      Peak Flow      Pain Score 3     Pain Loc      Pain Edu?      Excl. in [place]?    No data found.  Updated  Vital Signs BP 133/87 (BP Location: Right Arm)   Pulse 72   SpO2 94%   Visual Acuity Right Eye  Distance:   Left Eye Distance:   Bilateral Distance:    Right Eye Near:   Left Eye Near:    Bilateral Near:     Physical Exam  Constitutional: She appears well-developed and well-nourished. No distress.  HENT:  Head: Normocephalic.  Eyes: Pupils are equal, round, and reactive to light.  Cardiovascular: Normal rate.  Pulmonary/Chest: Effort normal.  Musculoskeletal:       Hands: 1.5cm simple laceration dorsal surface of left second finger on radial side of PIP joint.  All joints have full range of motion.  Distal neurovascular function is intact.   Neurological: She is alert.  Skin: Skin is warm and dry.  Nursing note and vitals reviewed.    UC Treatments / Results  Labs (all labs ordered are listed, but only abnormal results are displayed) Labs Reviewed - No data to display  EKG None  Radiology No results found.  Procedures Procedures Laceration Repair Discussed benefits and risks of procedure and verbal consent obtained. Using sterile technique and digital anesthesia with 2% lidocaine without epinephrine, cleansed wound with Betadine followed by copious lavage with normal saline.  Wound carefully inspected for debris and foreign bodies; none found.  Wound closed with #4, 5-0 interrupted nylon sutures.  A non-stick sterile dressing applied.  Wound precautions explained to patient.  Return for suture removal in 10 days.   Medications Ordered in UC Medications  Tdap (BOOSTRIX) injection 0.5 mL (0.5 mLs Intramuscular Given 05/26/18 1818)    Initial Impression / Assessment and Plan / UC Course  I have reviewed the triage vital signs and the nursing notes.  Pertinent labs & imaging results that were available during my care of the patient were reviewed by me and considered in my medical decision making (see chart for details).    Administered Tdap  Return one week for  suture removal (patient is going out of town in 8 days) Final Clinical Impressions(s) / UC Diagnoses   Final diagnoses:  Laceration of left index finger without foreign body without damage to nail, initial encounter     Discharge Instructions     Change dressing daily and keep wound clean and dry.  Return for any signs of infection (or follow-up with family doctor):  Increasing redness, swelling, pain, heat, drainage, etc. Return in 1 week for suture removal.      ED Prescriptions    None         Kandra Nicolas, MD 05/26/18 1920

## 2018-05-28 DIAGNOSIS — R918 Other nonspecific abnormal finding of lung field: Secondary | ICD-10-CM | POA: Diagnosis not present

## 2018-05-28 DIAGNOSIS — J4541 Moderate persistent asthma with (acute) exacerbation: Secondary | ICD-10-CM | POA: Diagnosis not present

## 2018-05-29 ENCOUNTER — Telehealth: Payer: Self-pay | Admitting: Osteopathic Medicine

## 2018-05-29 DIAGNOSIS — Z01818 Encounter for other preprocedural examination: Secondary | ICD-10-CM

## 2018-05-29 NOTE — Telephone Encounter (Signed)
Please call patient:  I have received paperwork from Dr Sherlyn Lick at [facility] for surgical clearance - since it's been >6 months since I've seen her (09/2017 though she has seen a few other providers in this office since then), I'd like her to schedule a visit here for surgical clearance. Please schedule this visit! Lab orders are in if she'd like blood drawn ahead of the visit, or she can get blood drawn same day if desired.

## 2018-05-29 NOTE — Telephone Encounter (Signed)
Dr Sheppard Coil, I called and scheduled pt for surgical clearance on Friday July 5th. The only available spot that day was at 11:30, which was the last spot of the day and followed after a physical. This is the only day that patient could make it due to going out of town, and her surgery is scheduled for Monday 06-11-18.

## 2018-05-29 NOTE — Telephone Encounter (Signed)
That's ok! Thanks for taking care of it

## 2018-05-31 DIAGNOSIS — M545 Low back pain: Secondary | ICD-10-CM | POA: Diagnosis not present

## 2018-05-31 DIAGNOSIS — M25551 Pain in right hip: Secondary | ICD-10-CM | POA: Diagnosis not present

## 2018-05-31 DIAGNOSIS — G894 Chronic pain syndrome: Secondary | ICD-10-CM | POA: Diagnosis not present

## 2018-05-31 DIAGNOSIS — M4316 Spondylolisthesis, lumbar region: Secondary | ICD-10-CM | POA: Diagnosis not present

## 2018-06-01 DIAGNOSIS — M545 Low back pain: Secondary | ICD-10-CM | POA: Diagnosis not present

## 2018-06-01 DIAGNOSIS — Z01818 Encounter for other preprocedural examination: Secondary | ICD-10-CM | POA: Diagnosis not present

## 2018-06-01 DIAGNOSIS — Z4689 Encounter for fitting and adjustment of other specified devices: Secondary | ICD-10-CM | POA: Diagnosis not present

## 2018-06-01 DIAGNOSIS — M961 Postlaminectomy syndrome, not elsewhere classified: Secondary | ICD-10-CM | POA: Diagnosis not present

## 2018-06-01 DIAGNOSIS — M5431 Sciatica, right side: Secondary | ICD-10-CM | POA: Diagnosis not present

## 2018-06-01 DIAGNOSIS — M47896 Other spondylosis, lumbar region: Secondary | ICD-10-CM | POA: Diagnosis not present

## 2018-06-01 DIAGNOSIS — M5432 Sciatica, left side: Secondary | ICD-10-CM | POA: Diagnosis not present

## 2018-06-01 DIAGNOSIS — M4726 Other spondylosis with radiculopathy, lumbar region: Secondary | ICD-10-CM | POA: Diagnosis not present

## 2018-06-04 NOTE — Telephone Encounter (Signed)
I have gotten a few faxes from [facility] asking me to complete surgical clearance.  Please call and let them know that since I have not seen this patient in greater than 6 months, I could not attest to her current state of health and she needs to schedule an appointment.  We called her as noted below, on the 25th, one day after receiving their initial fax.  The patient, due to travel plans, has not scheduled a surgical clearance appointment with me until the 06/08/18 which is the Friday before her surgery on a Monday.  They may want to speak with the patient and consider changing the date of her procedure.  Thanks.  She is seeing Dr. Sherlyn Lick -phone [phone]

## 2018-06-05 NOTE — Telephone Encounter (Signed)
Called and advised Margaret [name] at [facility]. She states she will let the Dr know and contact the patient if any changes need to be made.

## 2018-06-08 ENCOUNTER — Ambulatory Visit (INDEPENDENT_AMBULATORY_CARE_PROVIDER_SITE_OTHER): Payer: Medicare HMO | Admitting: Osteopathic Medicine

## 2018-06-08 ENCOUNTER — Encounter: Payer: Self-pay | Admitting: Osteopathic Medicine

## 2018-06-08 VITALS — BP 118/80 | HR 76 | Temp 97.8°F | Wt 132.1 lb

## 2018-06-08 DIAGNOSIS — Z01818 Encounter for other preprocedural examination: Secondary | ICD-10-CM

## 2018-06-08 DIAGNOSIS — R918 Other nonspecific abnormal finding of lung field: Secondary | ICD-10-CM | POA: Diagnosis not present

## 2018-06-08 DIAGNOSIS — L509 Urticaria, unspecified: Secondary | ICD-10-CM | POA: Diagnosis not present

## 2018-06-08 DIAGNOSIS — E039 Hypothyroidism, unspecified: Secondary | ICD-10-CM | POA: Diagnosis not present

## 2018-06-08 DIAGNOSIS — J4530 Mild persistent asthma, uncomplicated: Secondary | ICD-10-CM | POA: Diagnosis not present

## 2018-06-08 DIAGNOSIS — J479 Bronchiectasis, uncomplicated: Secondary | ICD-10-CM | POA: Diagnosis not present

## 2018-06-08 MED ORDER — LEVOTHYROXINE SODIUM 50 MCG PO TABS: 50.0000 ug | ORAL_TABLET | Freq: Every day | ORAL | 1 refills | Status: DC

## 2018-06-08 NOTE — Patient Instructions (Addendum)
Try Claritin 10 mg daily or twice per day for itching  Will send paperwork for surgery clearance

## 2018-06-08 NOTE — Progress Notes (Signed)
HPI: Margaret [name] is a 72 y.o. female who  has a past medical history of Anemia, Anxiety, Arthritis, Asthma, Cancer ([place]), COPD (chronic obstructive pulmonary disease) ([place]), Emphysema of lung ([place]), GERD (gastroesophageal reflux disease), Hypertension, Osteoporosis, and Thyroid disease.  she presents to [facility] for chief complaint of: Patient is seen 06/08/18 for peri-operative risk stratification: revision T10-S1 fusion.   Lacosta Mcmeen denies chest pain at rest or with exertion, denies dyspnea at rest or with exertion,denies lower extremity edema, denies claudication or palpitations. Patient has negative history of ischemic heart disease, CHF, CVD, diabetes, alcohol or drug abuse, recent anticoagulation or antithrombotic use, personal or family history of quite neuropathy, or chronic kidney disease.  Patient has previous history of undergoing cardiac stress test which was normal, no catheterization, coronary revascularization.  Patient reports being able to achieve approx 3-4 MET of activity during brisk walk, more intense exercise causes her SOB w/ hx COPD Minimal cardiac risk factors but prolonged surgical procedure plus hx COPD confer probably moderate risk.      Past medical, surgical, social and family history reviewed:  Patient Active Problem List   Diagnosis Date Noted  . Acute cystitis 04/23/2018  . H/O total knee replacement, left 10/23/2017  . Iron deficiency anemia 05/31/2017  . Pulmonary nodules 12/26/2016  . Shoulder pain, right 12/25/2016  . Other chronic pain 11/03/2016  . Thyroid nodule 03/30/2016  . Solitary pulmonary nodule 03/30/2016  . Strain of gluteus medius 10/07/2015  . Depression with anxiety 09/29/2015  . Hip pain, acute 08/31/2015  . Vitamin D insufficiency 04/08/2015  . Elevated vitamin B12 level 04/08/2015  . Hirsutism 01/28/2015  . Osteoarthritis of right knee 11/04/2014  . Pain in right hip 08/21/2014  .  Hair loss 08/21/2014  . Hypothyroidism 07/24/2014  . Vasomotor flushing 03/05/2014  . Intrinsic asthma 12/17/2013  . Spinal stenosis of lumbar region 10/14/2013  . Coffee ground emesis 03/06/2013  . Disorder of bursae and tendons in shoulder region 03/01/2013  . Anal fissure 02/22/2013  . Diverticulosis of colon without hemorrhage 02/22/2013  . Hyperglycemia 02/01/2013  . Essential hypertension, benign 01/31/2013  . CAP (community acquired pneumonia) 01/31/2013  . Osteoporosis 01/31/2013  . HSV infection 01/31/2013    Past Surgical History:  Procedure Laterality Date  . ABDOMINAL HYSTERECTOMY    . BREAST SURGERY    . CESAREAN SECTION    . JOINT REPLACEMENT     LT KNEE  . TOTAL KNEE ARTHROPLASTY  2006    Social History   Tobacco Use  . Smoking status: Former Smoker    Packs/day: 1.50    Years: 40.00    Pack years: 60.00    Last attempt to quit: 02/01/2008    Years since quitting: 10.3  . Smokeless tobacco: Never Used  Substance Use Topics  . Alcohol use: Yes    Comment: SOCIALLY    Family History  Problem Relation Age of Onset  . Stroke Other   . Emphysema Mother 39       Death cause was emphysema.  . Alcohol abuse Father   . COPD Sister   . Alcohol abuse Brother 54  . Cirrhosis Brother   . Cancer Maternal Grandmother 32       Stomach  . Stroke Maternal Grandfather 77     Current medication list and allergy/intolerance information reviewed:    Current Outpatient Medications  Medication Sig Dispense Refill  . albuterol (PROVENTIL HFA;VENTOLIN HFA) 108 (90 Base) MCG/ACT inhaler Inhale  1-2 puffs into the lungs every 4 (four) hours as needed for wheezing. 1 Inhaler 0  . amLODipine (NORVASC) 10 MG tablet TAKE 1 TABLET BY MOUTH EVERY DAY 90 tablet 0  . ferrous sulfate 325 (65 FE) MG EC tablet Take by mouth.    Cristy Friedlander HFA 220 MCG/ACT inhaler     . FLUoxetine (PROZAC) 20 MG capsule Take 1 capsule (20 mg total) by mouth daily. 90 capsule 1  . lidocaine  (XYLOCAINE) 2 % jelly Apply 1 application topically as needed. 30 mL 0  . losartan (COZAAR) 100 MG tablet TAKE 1 TABLET (100 MG TOTAL) BY MOUTH DAILY. PT NEEDS TO MAKE A F/U APPT W/PCP. 30 tablet 0  . montelukast (SINGULAIR) 10 MG tablet Take 10 mg by mouth daily.  2  . omeprazole (PRILOSEC) 20 MG capsule TAKE 1 CAPSULE BY MOUTH EVERY DAY    . Tiotropium Bromide-Olodaterol 2.5-2.5 MCG/ACT AERS Inhale 2 puffs into the lungs daily.    . fluconazole (DIFLUCAN) 150 MG tablet Take 1 tablet (150 mg total) by mouth daily. (Patient not taking: Reported on 06/08/2018) 1 tablet 1  . levothyroxine (SYNTHROID, LEVOTHROID) 50 MCG tablet Take 1 tablet (50 mcg total) by mouth daily. 90 tablet 1  . pregabalin (LYRICA) 150 MG capsule Take by mouth.     No current facility-administered medications for this visit.     Allergies  Allergen Reactions  . Neomycin-Bacitracin Zn-Polymyx Anaphylaxis  . Clarithromycin Other (See Comments)    THRUSH  . Other Other (See Comments)    ANTIBIOTIC OINT - UNSURE OF NAME  GI UPSET  . Raloxifene Other (See Comments)    Back pain PT NOT SURE ABOUT THIS REACTION  . Tetracyclines & Related     Other reaction(s): Other SERVER INDIGESTION  . Floxin [Ofloxacin] Hives  . Neosporin [Neomycin-Polymyxin-Gramicidin] Hives      Review of Systems:  Constitutional:  No  fever, no chills, No recent illness  HEENT: No  headache, no vision change  Cardiac: No  chest pain, No  pressure, No palpitations, No  Orthopnea, no claudication   Respiratory:  +shortness of breath w/ exertion. +occasional Cough  Gastrointestinal: No  abdominal pain, No  nausea, No  vomiting,  No  blood in stool, No  diarrhea, No  constipation   Musculoskeletal: No new myalgia/arthralgia  Skin: +itching Rash present since going to the beach last week, No other wounds/concerning lesions  Hem/Onc: No  easy bruising/bleeding  Neurologic: No  weakness, No  dizziness  Psychiatric: No  concerns with  depression  Exam:  BP 118/80 (BP Location: Left Arm, Patient Position: Sitting, Cuff Size: Normal)   Pulse 76   Temp 97.8 F (36.6 C) (Oral)   Wt 132 lb 1.6 oz (59.9 kg)   BMI 24.96 kg/m   Constitutional: VS see above. General Appearance: alert, well-developed, well-nourished, NAD  Eyes: Normal lids and conjunctive, non-icteric sclera  Ears, Nose, Mouth, Throat: MMM, Normal external inspection ears/nares/mouth/lips/gums. TM normal bilaterally. Pharynx/tonsils no erythema, no exudate. Nasal mucosa normal.   Neck: No masses, trachea midline. No thyroid enlargement. No tenderness/mass appreciated. No lymphadenopathy  Respiratory: Normal respiratory effort. no wheeze, no rhonchi, no rales  Cardiovascular: S1/S2 normal, no murmur, no rub/gallop auscultated. RRR. No lower extremity edema.   Gastrointestinal: Nontender, no masses. No hepatomegaly, no splenomegaly. No hernia appreciated. Bowel sounds normal. Rectal exam deferred.   Musculoskeletal: Gait normal. No clubbing/cyanosis of digits.   Neurological: Normal balance/coordination. No tremor. No cranial nerve deficit on  limited exam. Motor and sensation intact and symmetric. Cerebellar reflexes intact.   Skin: warm, dry, intact. Urticaria on chest, arms, torso   Psychiatric: Normal judgment/insight. Normal mood and affect. Oriented x3.      ASSESSMENT/PLAN: The primary encounter diagnosis was Pre-operative clearance. Diagnoses of Urticaria and Hypothyroidism, unspecified type were also pertinent to this visit.    OK to proceed w/ surgery - reviewed recent labs  WBC slight elevation, this is chronic on and off for her, would proceed w/ caution but no s/s infection at this time.    Sent synthroid, pt would like to be on this instead of Armour for cost  Urticaria, trial continue steroid cream for severe symptoms as needed but try adding Claritin as well   Patient Instructions  Try Claritin 10 mg daily or twice per day for  itching  Will send paperwork for surgery clearance     Visit summary with medication list and pertinent instructions was printed for patient to review. All questions at time of visit were answered - patient instructed to contact office with any additional concerns. ER/RTC precautions were reviewed with the patient.   Follow-up plan: Return in about 6 months (around 12/09/2018) for annual physical, sooner if needed .  Note: Total time spent 25 minutes, greater than 50% of the visit was spent face-to-face counseling and coordinating care for the above diagnoses in A/P   Please note: voice recognition software was used to produce this document, and typos may escape review. Please contact Dr. Sheppard Coil for any needed clarifications.

## 2018-06-11 DIAGNOSIS — M48062 Spinal stenosis, lumbar region with neurogenic claudication: Secondary | ICD-10-CM | POA: Diagnosis not present

## 2018-06-11 DIAGNOSIS — M5432 Sciatica, left side: Secondary | ICD-10-CM | POA: Diagnosis not present

## 2018-06-11 DIAGNOSIS — M4150 Other secondary scoliosis, site unspecified: Secondary | ICD-10-CM | POA: Diagnosis not present

## 2018-06-11 DIAGNOSIS — M4326 Fusion of spine, lumbar region: Secondary | ICD-10-CM | POA: Diagnosis not present

## 2018-06-11 DIAGNOSIS — Z882 Allergy status to sulfonamides status: Secondary | ICD-10-CM | POA: Diagnosis not present

## 2018-06-11 DIAGNOSIS — M961 Postlaminectomy syndrome, not elsewhere classified: Secondary | ICD-10-CM | POA: Diagnosis not present

## 2018-06-11 DIAGNOSIS — M4324 Fusion of spine, thoracic region: Secondary | ICD-10-CM | POA: Diagnosis not present

## 2018-06-11 DIAGNOSIS — M4327 Fusion of spine, lumbosacral region: Secondary | ICD-10-CM | POA: Diagnosis not present

## 2018-06-11 DIAGNOSIS — M4726 Other spondylosis with radiculopathy, lumbar region: Secondary | ICD-10-CM | POA: Diagnosis not present

## 2018-06-11 DIAGNOSIS — M4725 Other spondylosis with radiculopathy, thoracolumbar region: Secondary | ICD-10-CM | POA: Diagnosis not present

## 2018-06-11 DIAGNOSIS — I10 Essential (primary) hypertension: Secondary | ICD-10-CM | POA: Diagnosis not present

## 2018-06-11 DIAGNOSIS — M5431 Sciatica, right side: Secondary | ICD-10-CM | POA: Diagnosis not present

## 2018-06-11 DIAGNOSIS — M40209 Unspecified kyphosis, site unspecified: Secondary | ICD-10-CM | POA: Diagnosis not present

## 2018-06-11 DIAGNOSIS — J449 Chronic obstructive pulmonary disease, unspecified: Secondary | ICD-10-CM | POA: Diagnosis not present

## 2018-06-11 NOTE — Telephone Encounter (Signed)
Error

## 2018-06-13 ENCOUNTER — Other Ambulatory Visit: Payer: Self-pay | Admitting: *Deleted

## 2018-06-13 ENCOUNTER — Other Ambulatory Visit: Payer: Self-pay | Admitting: Osteopathic Medicine

## 2018-06-13 DIAGNOSIS — I10 Essential (primary) hypertension: Secondary | ICD-10-CM

## 2018-06-14 ENCOUNTER — Telehealth: Payer: Self-pay

## 2018-06-14 MED ORDER — LINACLOTIDE 145 MCG PO CAPS: 145.0000 ug | ORAL_CAPSULE | Freq: Every day | ORAL | 1 refills | Status: DC

## 2018-06-14 NOTE — Telephone Encounter (Signed)
Unable to leave a message,voice mail not set up. 

## 2018-06-14 NOTE — Telephone Encounter (Signed)
Sent Rx to [facility] on file

## 2018-06-14 NOTE — Telephone Encounter (Signed)
Margaret [name] is on opioids again due to having surgery and would like a prescription for Linzess. Please advise.

## 2018-06-15 DIAGNOSIS — J45909 Unspecified asthma, uncomplicated: Secondary | ICD-10-CM | POA: Diagnosis not present

## 2018-06-15 DIAGNOSIS — M5432 Sciatica, left side: Secondary | ICD-10-CM | POA: Diagnosis not present

## 2018-06-15 DIAGNOSIS — M419 Scoliosis, unspecified: Secondary | ICD-10-CM | POA: Diagnosis not present

## 2018-06-15 DIAGNOSIS — I10 Essential (primary) hypertension: Secondary | ICD-10-CM | POA: Diagnosis not present

## 2018-06-15 DIAGNOSIS — Z96652 Presence of left artificial knee joint: Secondary | ICD-10-CM | POA: Diagnosis not present

## 2018-06-15 DIAGNOSIS — M961 Postlaminectomy syndrome, not elsewhere classified: Secondary | ICD-10-CM | POA: Diagnosis not present

## 2018-06-15 DIAGNOSIS — M48062 Spinal stenosis, lumbar region with neurogenic claudication: Secondary | ICD-10-CM | POA: Diagnosis not present

## 2018-06-15 DIAGNOSIS — Z4789 Encounter for other orthopedic aftercare: Secondary | ICD-10-CM | POA: Diagnosis not present

## 2018-06-15 DIAGNOSIS — M4726 Other spondylosis with radiculopathy, lumbar region: Secondary | ICD-10-CM | POA: Diagnosis not present

## 2018-06-19 DIAGNOSIS — Z96652 Presence of left artificial knee joint: Secondary | ICD-10-CM | POA: Diagnosis not present

## 2018-06-19 DIAGNOSIS — J45909 Unspecified asthma, uncomplicated: Secondary | ICD-10-CM | POA: Diagnosis not present

## 2018-06-19 DIAGNOSIS — M5432 Sciatica, left side: Secondary | ICD-10-CM | POA: Diagnosis not present

## 2018-06-19 DIAGNOSIS — M48062 Spinal stenosis, lumbar region with neurogenic claudication: Secondary | ICD-10-CM | POA: Diagnosis not present

## 2018-06-19 DIAGNOSIS — M419 Scoliosis, unspecified: Secondary | ICD-10-CM | POA: Diagnosis not present

## 2018-06-19 DIAGNOSIS — M4726 Other spondylosis with radiculopathy, lumbar region: Secondary | ICD-10-CM | POA: Diagnosis not present

## 2018-06-19 DIAGNOSIS — I10 Essential (primary) hypertension: Secondary | ICD-10-CM | POA: Diagnosis not present

## 2018-06-19 DIAGNOSIS — Z4789 Encounter for other orthopedic aftercare: Secondary | ICD-10-CM | POA: Diagnosis not present

## 2018-06-19 DIAGNOSIS — M961 Postlaminectomy syndrome, not elsewhere classified: Secondary | ICD-10-CM | POA: Diagnosis not present

## 2018-06-21 ENCOUNTER — Encounter: Payer: Self-pay | Admitting: Physician Assistant

## 2018-06-21 ENCOUNTER — Ambulatory Visit (INDEPENDENT_AMBULATORY_CARE_PROVIDER_SITE_OTHER): Payer: Medicare HMO | Admitting: Physician Assistant

## 2018-06-21 VITALS — BP 103/65 | HR 76 | Temp 97.9°F | Wt 131.0 lb

## 2018-06-21 DIAGNOSIS — N390 Urinary tract infection, site not specified: Secondary | ICD-10-CM | POA: Diagnosis not present

## 2018-06-21 DIAGNOSIS — T83511A Infection and inflammatory reaction due to indwelling urethral catheter, initial encounter: Secondary | ICD-10-CM

## 2018-06-21 DIAGNOSIS — T402X5A Adverse effect of other opioids, initial encounter: Secondary | ICD-10-CM | POA: Diagnosis not present

## 2018-06-21 DIAGNOSIS — K5903 Drug induced constipation: Secondary | ICD-10-CM | POA: Diagnosis not present

## 2018-06-21 DIAGNOSIS — R8290 Unspecified abnormal findings in urine: Secondary | ICD-10-CM

## 2018-06-21 LAB — POCT URINALYSIS DIPSTICK
Blood, UA: NEGATIVE
Glucose, UA: NEGATIVE
Nitrite, UA: NEGATIVE
Protein, UA: POSITIVE — AB
Spec Grav, UA: 1.025
Urobilinogen, UA: 1 U/dL
pH, UA: 6

## 2018-06-21 MED ORDER — SULFAMETHOXAZOLE-TRIMETHOPRIM 800-160 MG PO TABS: 1.0000 | ORAL_TABLET | Freq: Two times a day (BID) | ORAL | 0 refills | Status: AC

## 2018-06-21 MED ORDER — BISACODYL 10 MG RE SUPP: 10.0000 mg | Freq: Every day | RECTAL | 0 refills | Status: DC | PRN

## 2018-06-21 MED ORDER — CEFTRIAXONE SODIUM 1 G IJ SOLR
1.0000 g | Freq: Once | INTRAMUSCULAR | Status: AC
  Administered 2018-06-21: 1 g via INTRAMUSCULAR

## 2018-06-21 MED ORDER — DOCUSATE SODIUM 100 MG PO CAPS: 100.0000 mg | ORAL_CAPSULE | Freq: Three times a day (TID) | ORAL | 3 refills | Status: DC

## 2018-06-21 NOTE — Patient Instructions (Addendum)
For constipation:  - bowel rest: clear liquid diet until having a bowel movement - continue Linzess - make sure you are taking this on an empty stomach - continue Miralax - start Dulcolax suppositories daily    Clear Liquid Diet, Adult A clear liquid diet is a diet that includes only liquids that you can see through. You may need to follow a clear liquid diet if:  You develop a medical condition right before or after you have surgery.  You were not able to eat food for a long period of time.  You had a condition that gave you diarrhea.  You are going to have an exam, such as a colonoscopy, in which instruments will be put into your body to look at parts of your digestive system.  You are going to have bowel surgery.  The usual goals of this diet are:  To rest the stomach and digestive system as much as possible.  To keep you hydrated.  To make sure you get some calories for energy.  To help you return to normal digestion.  Most people need to follow this diet for only a short period of time. What do I need to know about this diet?  A clear liquid is a liquid that you can see through when you hold it up to a light.  A clear liquid diet does not provide all the nutrients that you need. It is important to choose a variety of the liquids that are allowed on this diet. That way, you will get as many nutrients as possible.  If you are not sure whether you can have certain items, ask your health care provider. What can I have?  Water and flavored water.  Fruit juices that do not have pulp, such as cranberry juice and apple juice.  Tea and coffee without milk or cream.  Clear bouillon or broth.  Broth-based soups that have been strained.  Flavored gelatins.  Honey.  Sugar water.  Frozen ice or frozen ice pops that do not contain milk, yogurt, fruit pieces, or fruit pulp.  Clear sodas.  Clear sports drinks. The items listed above may not be a complete list of  recommended liquids. Contact your dietitian for more options. What can I not have?  Juices that have pulp.  Milk.  Cream or cream-based soups.  Yogurt. The items listed above may not be a complete list of liquids to avoid. Contact your dietitian for more information. Summary  A clear liquid diet is a diet that includes only liquids that you can see through.  The goal of this diet is to help you recover by resting your digestive system, keeping you hydrated, and providing nutrients.  Make sure to avoid liquids with milk, cream, or pulp while on this diet. This information is not intended to replace advice given to you by your health care provider. Make sure you discuss any questions you have with your health care provider. Document Released: 11/21/2005 Document Revised: 07/05/2016 Document Reviewed: 10/18/2013 Elsevier Interactive Patient Education  [employer].

## 2018-06-21 NOTE — Progress Notes (Signed)
HPI:                                                                Margaret [name] is a 72 y.o. female who presents to [facility] today for dysuria  Dysuria   This is a new problem. The current episode started 1 to 4 weeks ago. The problem occurs every urination. The problem has been unchanged. The quality of the pain is described as aching. There has been no fever. There is no history of pyelonephritis. Pertinent negatives include no flank pain, hematuria, hesitancy or urgency. She has tried nothing for the symptoms. Her past medical history is significant for catheterization.   Additionally patient reports she has not had a BM for 11 days. She recently had back surgery on 06/11/18 and has been taking opioids for pain. Denies abdominal fullness/pain, fecal urgency, nausea, vomiting. Taking Linzess and Miralax without relief.  Depression screen [facility] 2/9 06/08/2018 05/18/2017 02/08/2017 02/06/2017  Decreased Interest 0 0 0 0  Down, Depressed, Hopeless 0 0 0 0  PHQ - 2 Score 0 0 0 0  Altered sleeping 0 1 3 -  Tired, decreased energy 0 1 3 -  Change in appetite 3 0 2 -  Feeling bad or failure about yourself  0 0 0 -  Trouble concentrating 3 0 0 -  Moving slowly or fidgety/restless 0 0 0 -  Suicidal thoughts 0 0 0 -  PHQ-9 Score 6 2 8  -  Difficult doing work/chores Not difficult at all - - -    GAD 7 : Generalized Anxiety Score 06/08/2018 05/18/2017  Nervous, Anxious, on Edge 0 1  Control/stop worrying 0 0  Worry too much - different things 0 1  Trouble relaxing 0 1  Restless 0 1  Easily annoyed or irritable 0 0  Afraid - awful might happen 0 0  Total GAD 7 Score 0 4      Past Medical History:  Diagnosis Date  . Anemia   . Anxiety   . Arthritis   . Asthma   . Cancer ([place])   . COPD (chronic obstructive pulmonary disease) ([place])   . Emphysema of lung ([place])   . GERD (gastroesophageal reflux disease)   . Hypertension   . Osteoporosis   .  Thyroid disease    Past Surgical History:  Procedure Laterality Date  . ABDOMINAL HYSTERECTOMY    . BREAST SURGERY    . CESAREAN SECTION    . JOINT REPLACEMENT     LT KNEE  . TOTAL KNEE ARTHROPLASTY  2006   Social History   Tobacco Use  . Smoking status: Former Smoker    Packs/day: 1.50    Years: 40.00    Pack years: 60.00    Last attempt to quit: 02/01/2008    Years since quitting: 10.3  . Smokeless tobacco: Never Used  Substance Use Topics  . Alcohol use: Yes    Comment: SOCIALLY   family history includes Alcohol abuse in her father; Alcohol abuse (age of onset: 35) in her brother; COPD in her sister; Cancer (age of onset: 38) in her maternal grandmother; Cirrhosis in her brother; Emphysema (age of onset: 64) in her mother; Stroke in her other; Stroke (age of  onset: 68) in her maternal grandfather.    ROS: negative except as noted in the HPI  Medications: Current Outpatient Medications  Medication Sig Dispense Refill  . albuterol (PROVENTIL HFA;VENTOLIN HFA) 108 (90 Base) MCG/ACT inhaler Inhale 1-2 puffs into the lungs every 4 (four) hours as needed for wheezing. 1 Inhaler 0  . amLODipine (NORVASC) 10 MG tablet TAKE 1 TABLET BY MOUTH EVERY DAY 90 tablet 0  . ferrous sulfate 325 (65 FE) MG EC tablet Take by mouth.    Cristy Friedlander HFA 220 MCG/ACT inhaler     . FLUoxetine (PROZAC) 20 MG capsule Take 1 capsule (20 mg total) by mouth daily. 90 capsule 1  . levothyroxine (SYNTHROID, LEVOTHROID) 50 MCG tablet Take 1 tablet (50 mcg total) by mouth daily. 90 tablet 1  . lidocaine (XYLOCAINE) 2 % jelly Apply 1 application topically as needed. 30 mL 0  . linaclotide (LINZESS) 145 MCG CAPS capsule Take 1 capsule (145 mcg total) by mouth daily. To help with constipation. 30 capsule 1  . losartan (COZAAR) 100 MG tablet Take 1 tablet (100 mg total) by mouth daily. 90 tablet 1  . methocarbamol (ROBAXIN) 500 MG tablet Take by mouth.    . montelukast (SINGULAIR) 10 MG tablet Take 10 mg by  mouth daily.  2  . omeprazole (PRILOSEC) 20 MG capsule TAKE 1 CAPSULE BY MOUTH EVERY DAY    . oxyCODONE-acetaminophen (PERCOCET/ROXICET) 5-325 MG tablet Take 1 tablet by mouth every 4 (four) hours as needed. for pain  0  . Tiotropium Bromide-Olodaterol 2.5-2.5 MCG/ACT AERS Inhale 2 puffs into the lungs daily.    . pregabalin (LYRICA) 150 MG capsule Take by mouth.     No current facility-administered medications for this visit.    Allergies  Allergen Reactions  . Neomycin-Bacitracin Zn-Polymyx Anaphylaxis  . Clarithromycin Other (See Comments)    THRUSH  . Other Other (See Comments)    ANTIBIOTIC OINT - UNSURE OF NAME  GI UPSET  . Raloxifene Other (See Comments)    Back pain PT NOT SURE ABOUT THIS REACTION  . Tetracyclines & Related     Other reaction(s): Other SERVER INDIGESTION  . Floxin [Ofloxacin] Hives  . Neosporin [Neomycin-Polymyxin-Gramicidin] Hives       Objective:  BP 103/65   Pulse 76   Temp 97.9 F (36.6 C) (Oral)   Wt 131 lb (59.4 kg)   BMI 24.75 kg/m  Gen:  alert, not ill-appearing, no distress, appropriate for age 79: head normocephalic without obvious abnormality, conjunctiva and cornea clear, trachea midline Pulm: Normal work of breathing, normal phonation GI: abdomen slightly distended, soft, non-tender, no guarding or rigidity Neuro: alert and oriented x 3, no tremor MSK: extremities atraumatic, normal gait and station Skin: intact, no rashes on exposed skin, no jaundice, no cyanosis Psych: well-groomed, cooperative, good eye contact, euthymic mood, affect mood-congruent, speech is articulate, and thought processes clear and goal-directed    Results for orders placed or performed in visit on 06/21/18 (from the past 72 hour(s))  POCT Urinalysis Dipstick     Status: Abnormal   Collection Time: 06/21/18 10:27 AM  Result Value Ref Range   Color, UA dark yellow    Clarity, UA cloudy    Glucose, UA Negative Negative   Bilirubin, UA small     Ketones, UA trace    Spec Grav, UA 1.025 1.010 - 1.025   Blood, UA negative    pH, UA 6.0 5.0 - 8.0   Protein, UA Positive (A) Negative  Urobilinogen, UA 1.0 0.2 or 1.0 E.U./dL   Nitrite, UA negative    Leukocytes, UA Large (3+) (A) Negative   Appearance     Odor     No results found.    Assessment and Plan: 72 y.o. female with   Urinary tract infection associated with indwelling urethral catheter, initial encounter ([place]) - Plan: sulfamethoxazole-trimethoprim (BACTRIM DS) 800-160 MG tablet, cefTRIAXone (ROCEPHIN) injection 1 g  Abnormal urine odor - Plan: POCT Urinalysis Dipstick, Urine Culture  Constipation due to opioid therapy - Plan: docusate sodium (COLACE) 100 MG capsule  UA positive for large leuks. Treating for complicated UTI due to recent surgery and catheter. 1g Rocephin given IM in office today, Bactrim bid x 1 week  Constipation  - bowel rest: clear liquid diet until having a bowel movement - continue Linzess and Miralax - start Dulcolax suppositories daily until stooling  Patient education and anticipatory guidance given Patient agrees with treatment plan Follow-up as needed if symptoms worsen or fail to improve  Darlyne Russian PA-C

## 2018-06-24 LAB — URINE CULTURE
MICRO NUMBER:: 90852567
SPECIMEN QUALITY:: ADEQUATE

## 2018-06-25 ENCOUNTER — Ambulatory Visit: Payer: Medicare HMO | Admitting: Physician Assistant

## 2018-06-26 DIAGNOSIS — M4726 Other spondylosis with radiculopathy, lumbar region: Secondary | ICD-10-CM | POA: Diagnosis not present

## 2018-06-26 DIAGNOSIS — M961 Postlaminectomy syndrome, not elsewhere classified: Secondary | ICD-10-CM | POA: Diagnosis not present

## 2018-06-26 DIAGNOSIS — M5432 Sciatica, left side: Secondary | ICD-10-CM | POA: Diagnosis not present

## 2018-06-26 DIAGNOSIS — M48062 Spinal stenosis, lumbar region with neurogenic claudication: Secondary | ICD-10-CM | POA: Diagnosis not present

## 2018-06-26 DIAGNOSIS — I10 Essential (primary) hypertension: Secondary | ICD-10-CM | POA: Diagnosis not present

## 2018-06-26 DIAGNOSIS — Z96652 Presence of left artificial knee joint: Secondary | ICD-10-CM | POA: Diagnosis not present

## 2018-06-26 DIAGNOSIS — J45909 Unspecified asthma, uncomplicated: Secondary | ICD-10-CM | POA: Diagnosis not present

## 2018-06-26 DIAGNOSIS — Z4789 Encounter for other orthopedic aftercare: Secondary | ICD-10-CM | POA: Diagnosis not present

## 2018-06-26 DIAGNOSIS — M419 Scoliosis, unspecified: Secondary | ICD-10-CM | POA: Diagnosis not present

## 2018-06-28 DIAGNOSIS — M4726 Other spondylosis with radiculopathy, lumbar region: Secondary | ICD-10-CM | POA: Diagnosis not present

## 2018-06-28 DIAGNOSIS — M961 Postlaminectomy syndrome, not elsewhere classified: Secondary | ICD-10-CM | POA: Diagnosis not present

## 2018-06-28 DIAGNOSIS — Z96652 Presence of left artificial knee joint: Secondary | ICD-10-CM | POA: Diagnosis not present

## 2018-06-28 DIAGNOSIS — M48062 Spinal stenosis, lumbar region with neurogenic claudication: Secondary | ICD-10-CM | POA: Diagnosis not present

## 2018-06-28 DIAGNOSIS — M5432 Sciatica, left side: Secondary | ICD-10-CM | POA: Diagnosis not present

## 2018-06-28 DIAGNOSIS — Z4789 Encounter for other orthopedic aftercare: Secondary | ICD-10-CM | POA: Diagnosis not present

## 2018-06-28 DIAGNOSIS — J45909 Unspecified asthma, uncomplicated: Secondary | ICD-10-CM | POA: Diagnosis not present

## 2018-06-28 DIAGNOSIS — M419 Scoliosis, unspecified: Secondary | ICD-10-CM | POA: Diagnosis not present

## 2018-06-28 DIAGNOSIS — I10 Essential (primary) hypertension: Secondary | ICD-10-CM | POA: Diagnosis not present

## 2018-06-30 ENCOUNTER — Other Ambulatory Visit: Payer: Self-pay | Admitting: Osteopathic Medicine

## 2018-06-30 DIAGNOSIS — I10 Essential (primary) hypertension: Secondary | ICD-10-CM

## 2018-07-02 DIAGNOSIS — Z4789 Encounter for other orthopedic aftercare: Secondary | ICD-10-CM | POA: Diagnosis not present

## 2018-07-03 DIAGNOSIS — M48062 Spinal stenosis, lumbar region with neurogenic claudication: Secondary | ICD-10-CM | POA: Diagnosis not present

## 2018-07-03 DIAGNOSIS — J45909 Unspecified asthma, uncomplicated: Secondary | ICD-10-CM | POA: Diagnosis not present

## 2018-07-03 DIAGNOSIS — M419 Scoliosis, unspecified: Secondary | ICD-10-CM | POA: Diagnosis not present

## 2018-07-03 DIAGNOSIS — M5432 Sciatica, left side: Secondary | ICD-10-CM | POA: Diagnosis not present

## 2018-07-03 DIAGNOSIS — Z4789 Encounter for other orthopedic aftercare: Secondary | ICD-10-CM | POA: Diagnosis not present

## 2018-07-03 DIAGNOSIS — I10 Essential (primary) hypertension: Secondary | ICD-10-CM | POA: Diagnosis not present

## 2018-07-03 DIAGNOSIS — M4726 Other spondylosis with radiculopathy, lumbar region: Secondary | ICD-10-CM | POA: Diagnosis not present

## 2018-07-03 DIAGNOSIS — Z96652 Presence of left artificial knee joint: Secondary | ICD-10-CM | POA: Diagnosis not present

## 2018-07-03 DIAGNOSIS — M961 Postlaminectomy syndrome, not elsewhere classified: Secondary | ICD-10-CM | POA: Diagnosis not present

## 2018-07-05 DIAGNOSIS — Z4789 Encounter for other orthopedic aftercare: Secondary | ICD-10-CM | POA: Diagnosis not present

## 2018-07-05 DIAGNOSIS — M5432 Sciatica, left side: Secondary | ICD-10-CM | POA: Diagnosis not present

## 2018-07-05 DIAGNOSIS — M48062 Spinal stenosis, lumbar region with neurogenic claudication: Secondary | ICD-10-CM | POA: Diagnosis not present

## 2018-07-05 DIAGNOSIS — I10 Essential (primary) hypertension: Secondary | ICD-10-CM | POA: Diagnosis not present

## 2018-07-05 DIAGNOSIS — M419 Scoliosis, unspecified: Secondary | ICD-10-CM | POA: Diagnosis not present

## 2018-07-05 DIAGNOSIS — Z96652 Presence of left artificial knee joint: Secondary | ICD-10-CM | POA: Diagnosis not present

## 2018-07-05 DIAGNOSIS — J45909 Unspecified asthma, uncomplicated: Secondary | ICD-10-CM | POA: Diagnosis not present

## 2018-07-05 DIAGNOSIS — M961 Postlaminectomy syndrome, not elsewhere classified: Secondary | ICD-10-CM | POA: Diagnosis not present

## 2018-07-05 DIAGNOSIS — M4726 Other spondylosis with radiculopathy, lumbar region: Secondary | ICD-10-CM | POA: Diagnosis not present

## 2018-07-06 ENCOUNTER — Telehealth: Payer: Self-pay

## 2018-07-06 MED ORDER — NYSTATIN 100000 UNIT/ML MT SUSP: 500000.0000 [IU] | Freq: Four times a day (QID) | OROMUCOSAL | 0 refills | Status: DC

## 2018-07-06 NOTE — Telephone Encounter (Signed)
Margaret [name] called and states she has been on antibiotics and now has yellow and white places in her mouth. She would like medication for thrush. Please advise.

## 2018-07-06 NOTE — Telephone Encounter (Signed)
Pt advised.

## 2018-07-06 NOTE — Telephone Encounter (Signed)
Treatment sent

## 2018-07-10 ENCOUNTER — Ambulatory Visit: Payer: Medicare HMO | Admitting: Osteopathic Medicine

## 2018-07-11 ENCOUNTER — Ambulatory Visit (INDEPENDENT_AMBULATORY_CARE_PROVIDER_SITE_OTHER): Payer: Medicare HMO | Admitting: Osteopathic Medicine

## 2018-07-11 ENCOUNTER — Encounter: Payer: Self-pay | Admitting: Osteopathic Medicine

## 2018-07-11 VITALS — BP 92/65 | HR 71 | Temp 98.1°F | Wt 129.7 lb

## 2018-07-11 DIAGNOSIS — B370 Candidal stomatitis: Secondary | ICD-10-CM | POA: Diagnosis not present

## 2018-07-11 DIAGNOSIS — I10 Essential (primary) hypertension: Secondary | ICD-10-CM | POA: Diagnosis not present

## 2018-07-11 DIAGNOSIS — D649 Anemia, unspecified: Secondary | ICD-10-CM

## 2018-07-11 DIAGNOSIS — R7301 Impaired fasting glucose: Secondary | ICD-10-CM | POA: Diagnosis not present

## 2018-07-11 DIAGNOSIS — E039 Hypothyroidism, unspecified: Secondary | ICD-10-CM | POA: Diagnosis not present

## 2018-07-11 MED ORDER — FLUCONAZOLE 100 MG PO TABS: ORAL_TABLET | ORAL | 0 refills | Status: DC

## 2018-07-11 NOTE — Progress Notes (Signed)
HPI: Margaret [name] is a 72 y.o. female who  has a past medical history of Anemia, Anxiety, Arthritis, Asthma, Cancer ([place]), COPD (chronic obstructive pulmonary disease) ([place]), Emphysema of lung ([place]), GERD (gastroesophageal reflux disease), Hypertension, Osteoporosis, and Thyroid disease.  she presents to [facility] today, 07/11/18,  for chief complaint of:  Thrush  Recent antibiotics treatment, patient has developed thrush.  Recent oral solution to pharmacy about 5 days ago, patient states it has not made too much difference.       Past medical history, surgical history, and family history reviewed.  Current medication list and allergy/intolerance information reviewed.   (See remainder of HPI, ROS, Phys Exam below)    ASSESSMENT/PLAN:   Thrush - Plan: COMPLETE METABOLIC PANEL WITH GFR, TSH, CBC with Differential/Platelet  Essential hypertension, benign - BP a bit low, has been on the low side past few visits.  Will decrease losartan   Meds ordered this encounter  Medications  . fluconazole (DIFLUCAN) 100 MG tablet    Sig: Take 2 tablets (200 mg total) by mouth daily for 1 day, THEN 1 tablet (100 mg total) daily for 7 days.    Dispense:  9 tablet    Refill:  0    Patient Instructions  Will try oral antifungal medications Can still keep doing the mouthwash Will get labs to check sugar levels   BP a bit low, let's decrease losartan form 100 mg to 50 mg (can cut in half)     Follow-up plan: Return in about 1 week (around 07/18/2018) for recheck BO and thrush w/ Dr A, sooner if needed!.     ############################################ ############################################ ############################################ ############################################    Outpatient Encounter Medications as of 07/11/2018  Medication Sig  . albuterol (PROVENTIL HFA;VENTOLIN HFA) 108 (90 Base) MCG/ACT inhaler Inhale 1-2 puffs into the  lungs every 4 (four) hours as needed for wheezing.  [name] amLODipine (NORVASC) 10 MG tablet TAKE 1 TABLET BY MOUTH EVERY DAY  . ferrous sulfate 325 (65 FE) MG EC tablet Take by mouth.  Cristy Friedlander HFA 220 MCG/ACT inhaler   . FLUoxetine (PROZAC) 20 MG capsule Take 1 capsule (20 mg total) by mouth daily.  [name] levothyroxine (SYNTHROID, LEVOTHROID) 50 MCG tablet Take 1 tablet (50 mcg total) by mouth daily.  [name] lidocaine (XYLOCAINE) 2 % jelly Apply 1 application topically as needed.  . linaclotide (LINZESS) 145 MCG CAPS capsule Take 1 capsule (145 mcg total) by mouth daily. To help with constipation.  [name] losartan (COZAAR) 100 MG tablet Take 1 tablet (100 mg total) by mouth daily.  . methocarbamol (ROBAXIN) 500 MG tablet Take by mouth.  . montelukast (SINGULAIR) 10 MG tablet Take 10 mg by mouth daily.  [name] nystatin (MYCOSTATIN) 100000 UNIT/ML suspension Take 5 mLs (500,000 Units total) by mouth 4 (four) times daily for 14 days. Swish for 30 seconds and spit out.  [name] omeprazole (PRILOSEC) 20 MG capsule TAKE 1 CAPSULE BY MOUTH EVERY DAY  . oxyCODONE-acetaminophen (PERCOCET/ROXICET) 5-325 MG tablet Take 1 tablet by mouth every 4 (four) hours as needed. for pain  . Tiotropium Bromide-Olodaterol 2.5-2.5 MCG/ACT AERS Inhale 2 puffs into the lungs daily.  . bisacodyl (DULCOLAX) 10 MG suppository Place 1 suppository (10 mg total) rectally daily as needed for moderate constipation. (Patient not taking: Reported on 07/11/2018)  . docusate sodium (COLACE) 100 MG capsule Take 1 capsule (100 mg total) by mouth 3 (three) times daily. (Patient not taking: Reported on 07/11/2018)  . pregabalin (  LYRICA) 150 MG capsule Take by mouth.   No facility-administered encounter medications on file as of 07/11/2018.    Allergies  Allergen Reactions  . Neomycin-Bacitracin Zn-Polymyx Anaphylaxis  . Clarithromycin Other (See Comments)    THRUSH  . Other Other (See Comments)    ANTIBIOTIC OINT - UNSURE OF NAME  GI UPSET  . Raloxifene Other  (See Comments)    Back pain PT NOT SURE ABOUT THIS REACTION  . Tetracyclines & Related     Other reaction(s): Other SERVER INDIGESTION  . Floxin [Ofloxacin] Hives  . Neosporin [Neomycin-Polymyxin-Gramicidin] Hives      Review of Systems:  Constitutional: No recent illness  HEENT: No  headache, no vision change, thrush as per HPI  Cardiac: No  chest pain, No  pressure, No palpitations  Respiratory:  No  shortness of breath. No  Cough  Gastrointestinal: No  abdominal pain, no difficulty swallowing  Musculoskeletal: No new myalgia/arthralgia  Skin: No  Rash  Hem/Onc: No  easy bruising/bleeding, No  abnormal lumps/bumps  Neurologic: No  weakness, No  Dizziness  Psychiatric: No  concerns with depression, No  concerns with anxiety  Exam:  BP 92/65 (BP Location: Left Arm, Patient Position: Sitting, Cuff Size: Normal)   Pulse 71   Temp 98.1 F (36.7 C) (Oral)   Wt 129 lb 11.2 oz (58.8 kg)   BMI 24.51 kg/m   Constitutional: VS see above. General Appearance: alert, well-developed, well-nourished, NAD  Eyes: Normal lids and conjunctive, non-icteric sclera  Ears, Nose, Mouth, Throat: MMM.  See photographs above.  Pharynx/tonsils appear normal.   Neck: No masses, trachea midline.   Respiratory: Normal respiratory effort. no wheeze, no rhonchi, no rales  Cardiovascular: S1/S2 normal, no murmur, no rub/gallop auscultated. RRR.   Musculoskeletal: Gait normal.   Neurological: Normal balance/coordination. No tremor.  Skin: warm, dry, intact.   Psychiatric: Normal judgment/insight. Normal mood and affect. Oriented x3.   Visit summary with medication list and pertinent instructions was printed for patient to review, advised to alert [place] if any changes needed. All questions at time of visit were answered - patient instructed to contact office with any additional concerns. ER/RTC precautions were reviewed with the patient and understanding verbalized.   Follow-up plan: Return  in about 1 week (around 07/18/2018) for recheck BP and thrush w/ Dr A, sooner if needed!.    Please note: voice recognition software was used to produce this document, and typos may escape review. Please contact Dr. Sheppard Coil for any needed clarifications.

## 2018-07-11 NOTE — Patient Instructions (Signed)
Will try oral antifungal medications Can still keep doing the mouthwash Will get labs to check sugar levels   BP a bit low, let's decrease losartan form 100 mg to 50 mg (can cut in half)

## 2018-07-13 DIAGNOSIS — B3781 Candidal esophagitis: Secondary | ICD-10-CM | POA: Diagnosis not present

## 2018-07-13 DIAGNOSIS — K219 Gastro-esophageal reflux disease without esophagitis: Secondary | ICD-10-CM | POA: Diagnosis not present

## 2018-07-13 DIAGNOSIS — K449 Diaphragmatic hernia without obstruction or gangrene: Secondary | ICD-10-CM | POA: Diagnosis not present

## 2018-07-13 DIAGNOSIS — B370 Candidal stomatitis: Secondary | ICD-10-CM | POA: Diagnosis not present

## 2018-07-13 NOTE — Addendum Note (Signed)
Addended by: Maryla Morrow on: 07/13/2018 07:53 AM   Modules accepted: Orders

## 2018-07-14 LAB — CBC WITH DIFFERENTIAL/PLATELET
BASOS ABS: 54 {cells}/uL (ref 0–200)
Basophils Relative: 0.6 %
Eosinophils Absolute: 279 cells/uL (ref 15–500)
Eosinophils Relative: 3.1 %
HEMATOCRIT: 27.9 % — AB (ref 35.0–45.0)
HEMOGLOBIN: 8.7 g/dL — AB (ref 11.7–15.5)
LYMPHS ABS: 1359 {cells}/uL (ref 850–3900)
MCH: 28 pg (ref 27.0–33.0)
MCHC: 31.2 g/dL — AB (ref 32.0–36.0)
MCV: 89.7 fL (ref 80.0–100.0)
MPV: 10.3 fL (ref 7.5–12.5)
Monocytes Relative: 8.4 %
NEUTROS ABS: 6552 {cells}/uL (ref 1500–7800)
Neutrophils Relative %: 72.8 %
Platelets: 313 10*3/uL (ref 140–400)
RBC: 3.11 10*6/uL — ABNORMAL LOW (ref 3.80–5.10)
RDW: 13.4 % (ref 11.0–15.0)
Total Lymphocyte: 15.1 %
WBC: 9 10*3/uL (ref 3.8–10.8)
WBCMIX: 756 {cells}/uL (ref 200–950)

## 2018-07-14 LAB — TEST AUTHORIZATION

## 2018-07-14 LAB — COMPLETE METABOLIC PANEL WITH GFR
AG Ratio: 1.6 (calc) (ref 1.0–2.5)
ALKALINE PHOSPHATASE (APISO): 119 U/L (ref 33–130)
ALT: 12 U/L (ref 6–29)
AST: 19 U/L (ref 10–35)
Albumin: 4.2 g/dL (ref 3.6–5.1)
BUN / CREAT RATIO: 19 (calc) (ref 6–22)
BUN: 20 mg/dL (ref 7–25)
CALCIUM: 9.4 mg/dL (ref 8.6–10.4)
CO2: 24 mmol/L (ref 20–32)
CREATININE: 1.03 mg/dL — AB (ref 0.60–0.93)
Chloride: 103 mmol/L (ref 98–110)
GFR, EST AFRICAN AMERICAN: 63 mL/min/{1.73_m2} (ref >=60)
GFR, EST NON AFRICAN AMERICAN: 55 mL/min/{1.73_m2} — AB (ref >=60)
GLUCOSE: 114 mg/dL — AB (ref 65–99)
Globulin: 2.7 g/dL (calc) (ref 1.9–3.7)
Potassium: 4.6 mmol/L (ref 3.5–5.3)
Sodium: 137 mmol/L (ref 135–146)
TOTAL PROTEIN: 6.9 g/dL (ref 6.1–8.1)
Total Bilirubin: 0.3 mg/dL (ref 0.2–1.2)

## 2018-07-14 LAB — TSH: TSH: 3.87 m[IU]/L (ref 0.40–4.50)

## 2018-07-14 LAB — HEMOGLOBIN A1C W/OUT EAG: Hgb A1c MFr Bld: 5.1 % of total Hgb (ref <5.7)

## 2018-07-18 DIAGNOSIS — D509 Iron deficiency anemia, unspecified: Secondary | ICD-10-CM | POA: Diagnosis not present

## 2018-07-18 DIAGNOSIS — D649 Anemia, unspecified: Secondary | ICD-10-CM | POA: Diagnosis not present

## 2018-07-19 DIAGNOSIS — M545 Low back pain: Secondary | ICD-10-CM | POA: Diagnosis not present

## 2018-07-19 DIAGNOSIS — M4325 Fusion of spine, thoracolumbar region: Secondary | ICD-10-CM | POA: Diagnosis not present

## 2018-07-19 DIAGNOSIS — Z981 Arthrodesis status: Secondary | ICD-10-CM | POA: Diagnosis not present

## 2018-07-19 DIAGNOSIS — M4327 Fusion of spine, lumbosacral region: Secondary | ICD-10-CM | POA: Diagnosis not present

## 2018-07-19 DIAGNOSIS — Z6825 Body mass index (BMI) 25.0-25.9, adult: Secondary | ICD-10-CM | POA: Diagnosis not present

## 2018-07-19 DIAGNOSIS — M4726 Other spondylosis with radiculopathy, lumbar region: Secondary | ICD-10-CM | POA: Diagnosis not present

## 2018-07-19 DIAGNOSIS — M4316 Spondylolisthesis, lumbar region: Secondary | ICD-10-CM | POA: Diagnosis not present

## 2018-07-19 DIAGNOSIS — M961 Postlaminectomy syndrome, not elsewhere classified: Secondary | ICD-10-CM | POA: Diagnosis not present

## 2018-07-20 ENCOUNTER — Encounter: Payer: Self-pay | Admitting: Osteopathic Medicine

## 2018-07-20 ENCOUNTER — Telehealth: Payer: Self-pay | Admitting: Osteopathic Medicine

## 2018-07-20 ENCOUNTER — Ambulatory Visit (INDEPENDENT_AMBULATORY_CARE_PROVIDER_SITE_OTHER): Payer: Medicare HMO | Admitting: Osteopathic Medicine

## 2018-07-20 VITALS — BP 127/85 | HR 76 | Temp 98.0°F | Wt 132.4 lb

## 2018-07-20 DIAGNOSIS — B370 Candidal stomatitis: Secondary | ICD-10-CM | POA: Diagnosis not present

## 2018-07-20 DIAGNOSIS — N898 Other specified noninflammatory disorders of vagina: Secondary | ICD-10-CM | POA: Diagnosis not present

## 2018-07-20 DIAGNOSIS — D649 Anemia, unspecified: Secondary | ICD-10-CM | POA: Diagnosis not present

## 2018-07-20 LAB — CBC
HEMATOCRIT: 27.7 % — AB (ref 35.0–45.0)
HEMOGLOBIN: 8.7 g/dL — AB (ref 11.7–15.5)
MCH: 28.2 pg (ref 27.0–33.0)
MCHC: 31.4 g/dL — ABNORMAL LOW (ref 32.0–36.0)
MCV: 89.9 fL (ref 80.0–100.0)
MPV: 10.4 fL (ref 7.5–12.5)
Platelets: 309 10*3/uL (ref 140–400)
RBC: 3.08 10*6/uL — AB (ref 3.80–5.10)
RDW: 14.4 % (ref 11.0–15.0)
WBC: 7.6 10*3/uL (ref 3.8–10.8)

## 2018-07-20 LAB — PATHOLOGIST SMEAR REVIEW

## 2018-07-20 LAB — IRON,TIBC AND FERRITIN PANEL
%SAT: 97 % — AB (ref 16–45)
FERRITIN: 44 ng/mL (ref 16–288)
Iron: 332 ug/dL — ABNORMAL HIGH (ref 45–160)
TIBC: 342 ug/dL (ref 250–450)

## 2018-07-20 LAB — TEST AUTHORIZATION

## 2018-07-20 LAB — RETICULOCYTES
ABS RETIC: 122000 {cells}/uL — AB (ref 20000–8000)
Retic Ct Pct: 4 %

## 2018-07-20 MED ORDER — LIDOCAINE HCL URETHRAL/MUCOSAL 2 % EX GEL: 1.0000 "application " | CUTANEOUS | 0 refills | Status: DC | PRN

## 2018-07-20 MED ORDER — FLUCONAZOLE 100 MG PO TABS: ORAL_TABLET | ORAL | 0 refills | Status: AC

## 2018-07-20 MED ORDER — OMEPRAZOLE 40 MG PO CPDR: 40.0000 mg | DELAYED_RELEASE_CAPSULE | Freq: Two times a day (BID) | ORAL | 1 refills | Status: DC

## 2018-07-20 MED ORDER — NYSTATIN 100000 UNIT/ML MT SUSP: 500000.0000 [IU] | Freq: Four times a day (QID) | OROMUCOSAL | 0 refills | Status: DC

## 2018-07-20 NOTE — Patient Instructions (Addendum)
We will call Dr Theodoro Grist office and let them know that you need to be seen sooner for likely endoscopy of stomach +/- colonoscopy to rule out a GI bleed.   Will increase the omeprazole to 40 mg twice per day until we can evaluate for GI bleed.   Hold losartan for now.  Will stay on Amlodipine   Refilling the thrush medicine (pills and mouthwash) and vaginal cream   If you notice severe abdominal pain, throwing up blood or coffee grounds, dark black tarry stool or lots of blood in the stool, or if severe weakness/passing out, TO EMERGENCY ROOM!

## 2018-07-20 NOTE — Progress Notes (Signed)
HPI: Margaret [name] is a 72 y.o. female who  has a past medical history of Anemia, Anxiety, Arthritis, Asthma, Cancer ([place]), COPD (chronic obstructive pulmonary disease) ([place]), Emphysema of lung ([place]), GERD (gastroesophageal reflux disease), Hypertension, Osteoporosis, and Thyroid disease.  she presents to [facility] today, 07/20/18,  for chief complaint of:  Recent thrush recheck BP recheck  Anemia - new issue  Seen last week, 9 days ago.  Thrush treatment at that time, developed after antibiotic treatment but was not getting any better.  We opted for treatment with oral medications but also got some labs for further evaluation of this and relative hypotension compared to previous visits.  We also did decreased losartan. She has help losartan altogether.   Recently seen by GI for GERD, no mention of anemia   Lab work-up revealed significant anemia, we discussed this today.     Past medical history, surgical history, and family history reviewed.  Current medication list and allergy/intolerance information reviewed.   (See remainder of HPI, ROS, Phys Exam below)  No results found.  Results for orders placed or performed in visit on 07/11/18 (from the past 72 hour(s))  CBC     Status: Abnormal   Collection Time: 07/18/18  3:29 PM  Result Value Ref Range   WBC 7.6 3.8 - 10.8 Thousand/uL   RBC 3.08 (L) 3.80 - 5.10 Million/uL   Hemoglobin 8.7 (L) 11.7 - 15.5 g/dL   HCT 27.7 (L) 35.0 - 45.0 %   MCV 89.9 80.0 - 100.0 fL   MCH 28.2 27.0 - 33.0 pg   MCHC 31.4 (L) 32.0 - 36.0 g/dL   RDW 14.4 11.0 - 15.0 %   Platelets 309 140 - 400 Thousand/uL   MPV 10.4 7.5 - 12.5 fL  Fe+TIBC+Fer     Status: Abnormal   Collection Time: 07/18/18  3:29 PM  Result Value Ref Range   Iron 332 (H) 45 - 160 mcg/dL   TIBC 342 250 - 450 mcg/dL (calc)   %SAT 97 (H) 16 - 45 % (calc)   Ferritin 44 16 - 288 ng/mL  Pathologist smear review     Status: None   Collection Time:  07/18/18  3:29 PM  Result Value Ref Range   Path Review      Comment: WBC and Platelet morphology unremarkable. Anemia with RBCs which appear to be microcytic and hypochromic on smear review. Suggest evaluation for iron deficiency and/or a source of blood loss, if clinically indicated. Reviewed by Odis Hollingshead, MD, PhD, FCAP (Electronic Signature on File)   07/19/2018   Reticulocytes     Status: Abnormal   Collection Time: 07/18/18  3:29 PM  Result Value Ref Range   Retic Ct Pct 4.0 %   ABS Retic 122,000 (H) 20,000 - 8,000 cells/uL  TEST AUTHORIZATION     Status: None   Collection Time: 07/18/18  3:29 PM  Result Value Ref Range   TEST NAME: RETICULOCYTE COUNT    TEST CODE: [id number]    CLIENT CONTACT: MS HERNANDEZ    REPORT ALWAYS MESSAGE SIGNATURE      Comment: . The laboratory testing on this patient was verbally requested or confirmed by the ordering physician or his or her authorized representative after contact with an employee of [employer]. Federal regulations require that we maintain on file written authorization for all laboratory testing.  Accordingly we are asking that the ordering physician or his or her authorized representative sign a copy  of this report and promptly return it to the client service representative. . . Signature:____________________________________________________ . Please fax this signed page to [phone] or return it via your [employer] courier.      ASSESSMENT/PLAN: The primary encounter diagnosis was Anemia, unspecified type. Diagnoses of Thrush and Vaginal irritation were also pertinent to this visit.  Meds ordered this encounter  Medications  . omeprazole (PRILOSEC) 40 MG capsule    Sig: Take 1 capsule (40 mg total) by mouth 2 (two) times daily before a meal.    Dispense:  60 capsule    Refill:  1  . nystatin (MYCOSTATIN) 100000 UNIT/ML suspension    Sig: Take 5 mLs (500,000 Units total) by mouth 4 (four)  times daily for 14 days. Swish for 30 seconds and spit out.    Dispense:  180 mL    Refill:  0  . fluconazole (DIFLUCAN) 100 MG tablet    Sig: Take 2 tablets (200 mg total) by mouth daily for 1 day, THEN 1 tablet (100 mg total) daily for 7 days.    Dispense:  9 tablet    Refill:  0  . lidocaine (XYLOCAINE) 2 % jelly    Sig: Apply 1 application topically as needed.    Dispense:  30 mL    Refill:  0      Patient Instructions  We will call Dr Theodoro Grist office and let them know that you need to be seen sooner for likely endoscopy of stomach +/- colonoscopy to rule out a GI bleed.   Will increase the omeprazole to 40 mg twice per day until we can evaluate for GI bleed.   Hold losartan for now.  Will stay on Amlodipine   Refilling the thrush medicine (pills and mouthwash) and vaginal cream   If you notice severe abdominal pain, throwing up blood or coffee grounds, dark black tarry stool or lots of blood in the stool, or if severe weakness/passing out, TO EMERGENCY ROOM!    Follow-up plan: Return for recheck after Dr Glennon Hamilton sees you, or as needed .               ############################################ ############################################ ############################################ ############################################    Outpatient Encounter Medications as of 07/20/2018  Medication Sig  . albuterol (PROVENTIL HFA;VENTOLIN HFA) 108 (90 Base) MCG/ACT inhaler Inhale 1-2 puffs into the lungs every 4 (four) hours as needed for wheezing.  [name] amLODipine (NORVASC) 10 MG tablet TAKE 1 TABLET BY MOUTH EVERY DAY  . bisacodyl (DULCOLAX) 10 MG suppository Place 1 suppository (10 mg total) rectally daily as needed for moderate constipation. (Patient not taking: Reported on 07/11/2018)  . docusate sodium (COLACE) 100 MG capsule Take 1 capsule (100 mg total) by mouth 3 (three) times daily. (Patient not taking: Reported on 07/11/2018)  . ferrous sulfate 325 (65 FE) MG EC  tablet Take by mouth.  Cristy Friedlander HFA 220 MCG/ACT inhaler   . FLUoxetine (PROZAC) 20 MG capsule Take 1 capsule (20 mg total) by mouth daily.  [name] levothyroxine (SYNTHROID, LEVOTHROID) 50 MCG tablet Take 1 tablet (50 mcg total) by mouth daily.  [name] lidocaine (XYLOCAINE) 2 % jelly Apply 1 application topically as needed.  . linaclotide (LINZESS) 145 MCG CAPS capsule Take 1 capsule (145 mcg total) by mouth daily. To help with constipation.  [name] losartan (COZAAR) 100 MG tablet Take 1 tablet (100 mg total) by mouth daily.  . montelukast (SINGULAIR) 10 MG tablet Take 10 mg by mouth daily.  [name] nystatin (MYCOSTATIN) 100000  UNIT/ML suspension Take 5 mLs (500,000 Units total) by mouth 4 (four) times daily for 14 days. Swish for 30 seconds and spit out.  [name] omeprazole (PRILOSEC) 20 MG capsule TAKE 1 CAPSULE BY MOUTH EVERY DAY  . oxyCODONE-acetaminophen (PERCOCET/ROXICET) 5-325 MG tablet Take 1 tablet by mouth every 4 (four) hours as needed. for pain  . pregabalin (LYRICA) 150 MG capsule Take by mouth.  . Tiotropium Bromide-Olodaterol 2.5-2.5 MCG/ACT AERS Inhale 2 puffs into the lungs daily.   No facility-administered encounter medications on file as of 07/20/2018.    Allergies  Allergen Reactions  . Neomycin-Bacitracin Zn-Polymyx Anaphylaxis  . Clarithromycin Other (See Comments)    THRUSH  . Other Other (See Comments)    ANTIBIOTIC OINT - UNSURE OF NAME  GI UPSET  . Raloxifene Other (See Comments)    Back pain PT NOT SURE ABOUT THIS REACTION  . Tetracyclines & Related     Other reaction(s): Other SERVER INDIGESTION  . Floxin [Ofloxacin] Hives  . Neosporin [Neomycin-Polymyxin-Gramicidin] Hives      Review of Systems:  Constitutional: No recent illness  HEENT: No  headache, no vision change  Cardiac: No  chest pain, No  pressure, No palpitations  Respiratory:  No  shortness of breath. No  Cough  Gastrointestinal: No  abdominal pain, no change on bowel habits  Musculoskeletal: No new  myalgia/arthralgia  Skin: No  Rash  Hem/Onc: No  easy bruising/bleeding, No  abnormal lumps/bumps  Neurologic: No  weakness, No  Dizziness  Psychiatric: No  concerns with depression, No  concerns with anxiety  Exam:  BP 127/85 (BP Location: Left Arm, Patient Position: Sitting, Cuff Size: Normal)   Pulse 76   Temp 98 F (36.7 C) (Oral)   Wt 132 lb 6.4 oz (60.1 kg)   BMI 25.02 kg/m   Constitutional: VS see above. General Appearance: alert, well-developed, well-nourished, NAD  Eyes: Normal lids and conjunctive, non-icteric sclera  Ears, Nose, Mouth, Throat: MMM, Normal external inspection ears/nares/mouth/lips/gums.  Neck: No masses, trachea midline.   Respiratory: Normal respiratory effort. no wheeze, no rhonchi, no rales  Cardiovascular: S1/S2 normal, no murmur, no rub/gallop auscultated. RRR.   Musculoskeletal: Gait normal. Symmetric and independent movement of all extremities  Neurological: Normal balance/coordination. No tremor.  Skin: warm, dry, intact.   Psychiatric: Normal judgment/insight. Normal mood and affect. Oriented x3.   Visit summary with medication list and pertinent instructions was printed for patient to review, advised to alert [place] if any changes needed. All questions at time of visit were answered - patient instructed to contact office with any additional concerns. ER/RTC precautions were reviewed with the patient and understanding verbalized.   Follow-up plan: Return for recheck after Dr Glennon Hamilton sees you, or as needed .  Note: Total time spent 25 minutes, greater than 50% of the visit was spent face-to-face counseling and coordinating care for the following: The primary encounter diagnosis was Anemia, unspecified type. Diagnoses of Thrush and Vaginal irritation were also pertinent to this visit.[name]  Please note: voice recognition software was used to produce this document, and typos may escape review. Please contact Dr. Sheppard Coil for any needed  clarifications.

## 2018-07-20 NOTE — Telephone Encounter (Signed)
-----   Message from Emeterio Reeve, DO sent at 07/20/2018 10:18 AM EDT ----- Can we call her GI office - she needs seen ASAP, anemia needs r/o GI bleed!   Rosana Berger, [address]  [place], [place] [address]  [phone]  [phone] (Fax)

## 2018-07-20 NOTE — Telephone Encounter (Signed)
Pt was scheduled for appt with GI Monday at 1400. Per PCP, it is OK for Pt to wait until Monday. Pt advised to go to ER immediately if she has any change in symptoms. Verbalized understanding. No further questions.

## 2018-07-24 DIAGNOSIS — D649 Anemia, unspecified: Secondary | ICD-10-CM | POA: Diagnosis not present

## 2018-08-01 ENCOUNTER — Other Ambulatory Visit: Payer: Self-pay | Admitting: Osteopathic Medicine

## 2018-08-01 NOTE — Telephone Encounter (Signed)
OK to refill once but if she's still having thrush issues after this she should come see me - she might need referral to ENT or infectious disease

## 2018-08-01 NOTE — Telephone Encounter (Signed)
[facility] pharmacy requesting med RF for nystatin oral rinse. Pls advise if RF appropriate.

## 2018-08-01 NOTE — Telephone Encounter (Signed)
Pt has been updated of provider's note.

## 2018-08-08 ENCOUNTER — Ambulatory Visit (INDEPENDENT_AMBULATORY_CARE_PROVIDER_SITE_OTHER): Payer: Medicare HMO | Admitting: Osteopathic Medicine

## 2018-08-08 ENCOUNTER — Encounter: Payer: Self-pay | Admitting: Osteopathic Medicine

## 2018-08-08 VITALS — BP 115/80 | HR 72 | Temp 98.1°F | Wt 127.9 lb

## 2018-08-08 DIAGNOSIS — R918 Other nonspecific abnormal finding of lung field: Secondary | ICD-10-CM | POA: Diagnosis not present

## 2018-08-08 DIAGNOSIS — J432 Centrilobular emphysema: Secondary | ICD-10-CM | POA: Diagnosis not present

## 2018-08-08 DIAGNOSIS — B370 Candidal stomatitis: Secondary | ICD-10-CM | POA: Diagnosis not present

## 2018-08-08 DIAGNOSIS — D649 Anemia, unspecified: Secondary | ICD-10-CM

## 2018-08-08 DIAGNOSIS — J479 Bronchiectasis, uncomplicated: Secondary | ICD-10-CM | POA: Diagnosis not present

## 2018-08-08 DIAGNOSIS — R490 Dysphonia: Secondary | ICD-10-CM

## 2018-08-08 DIAGNOSIS — K449 Diaphragmatic hernia without obstruction or gangrene: Secondary | ICD-10-CM | POA: Diagnosis not present

## 2018-08-08 MED ORDER — FLUCONAZOLE 200 MG PO TABS: 200.0000 mg | ORAL_TABLET | Freq: Every day | ORAL | 0 refills | Status: AC

## 2018-08-08 MED ORDER — CLOTRIMAZOLE 10 MG MT TROC: 10.0000 mg | Freq: Every day | OROMUCOSAL | 0 refills | Status: AC

## 2018-08-08 NOTE — Progress Notes (Signed)
HPI: Margaret [name] is a 72 y.o. female who  has a past medical history of Anemia, Anxiety, Arthritis, Asthma, Cancer ([place]), COPD (chronic obstructive pulmonary disease) ([place]), Emphysema of lung ([place]), GERD (gastroesophageal reflux disease), Hypertension, Osteoporosis, and Thyroid disease.  she presents to [facility] today, 08/08/18,  for chief complaint of:  Thrush Anemia  Saw GI 07/24/18, they had lower suspicion for GI losses (doesn't look likt the patient mentioned coffee-grounds emesis to them). Patient hasn't gotten stool test done from GI because they told her to stop the iron pills for 7 days before the test, she didn't want to stop the iron pills because she doesn't want the blood counts to go low...   Still has thrush. We have done 2 treatments of Diflucan 100 mg daily as well as nystatin wash. Improved but not resolved. Reports mouth pain, hoarseness.     Past medical history, surgical history, and family history reviewed.  Current medication list and allergy/intolerance information reviewed.   (See remainder of HPI, ROS, Phys Exam below)  No results found.  No results found for this or any previous visit (from the past 72 hour(s)).   ASSESSMENT/PLAN: The primary encounter diagnosis was Anemia, unspecified type. Diagnoses of Thrush and Hoarseness were also pertinent to this visit.   If no better after this will need to send to ID or w/u other immune deficiency or occult malignancy, reports hoarseness and mouth pain, no difficulty swallowing. Consider ENT referral for laryngoscopy    Meds ordered this encounter  Medications  . clotrimazole (MYCELEX) 10 MG troche    Sig: Take 1 tablet (10 mg total) by mouth 5 (five) times daily for 7 days.    Dispense:  35 tablet    Refill:  0  . fluconazole (DIFLUCAN) 200 MG tablet    Sig: Take 1 tablet (200 mg total) by mouth daily for 7 days.    Dispense:  7 tablet    Refill:  0    Patient  Instructions  Will try diflucan higher dose as well as clotrimazole oral troches Finish the stool test See GI as directed     Follow-up plan: Return in about 3 months (around 11/07/2018) for [place] - sooner if needed .                    ############################################ ############################################ ############################################ ############################################    Outpatient Encounter Medications as of 08/08/2018  Medication Sig  . albuterol (PROVENTIL HFA;VENTOLIN HFA) 108 (90 Base) MCG/ACT inhaler Inhale 1-2 puffs into the lungs every 4 (four) hours as needed for wheezing.  [name] amLODipine (NORVASC) 10 MG tablet TAKE 1 TABLET BY MOUTH EVERY DAY  . bisacodyl (DULCOLAX) 10 MG suppository Place 1 suppository (10 mg total) rectally daily as needed for moderate constipation.  . docusate sodium (COLACE) 100 MG capsule Take 1 capsule (100 mg total) by mouth 3 (three) times daily.  . ferrous sulfate 325 (65 FE) MG EC tablet Take by mouth.  Cristy Friedlander HFA 220 MCG/ACT inhaler   . FLUoxetine (PROZAC) 20 MG capsule Take 1 capsule (20 mg total) by mouth daily.  [name] levothyroxine (SYNTHROID, LEVOTHROID) 50 MCG tablet Take 1 tablet (50 mcg total) by mouth daily.  [name] lidocaine (XYLOCAINE) 2 % jelly Apply 1 application topically as needed.  . linaclotide (LINZESS) 145 MCG CAPS capsule Take 1 capsule (145 mcg total) by mouth daily. To help with constipation.  [name] losartan (COZAAR) 100 MG tablet Take 1  tablet (100 mg total) by mouth daily.  . montelukast (SINGULAIR) 10 MG tablet Take 10 mg by mouth daily.  [name] nystatin (MYCOSTATIN) 100000 UNIT/ML suspension TAKE 5 MLS BY MOUTH 4 TIMES DAILY FOR 14 DAYS. SWISH FOR 30 SECONDS AND SPIT OUT.  [name] omeprazole (PRILOSEC) 40 MG capsule Take 1 capsule (40 mg total) by mouth 2 (two) times daily before a meal.  . oxyCODONE-acetaminophen (PERCOCET/ROXICET) 5-325 MG tablet Take 1 tablet by  mouth every 4 (four) hours as needed. for pain  . pregabalin (LYRICA) 150 MG capsule Take by mouth.  . Tiotropium Bromide-Olodaterol 2.5-2.5 MCG/ACT AERS Inhale 2 puffs into the lungs daily.   No facility-administered encounter medications on file as of 08/08/2018.    Allergies  Allergen Reactions  . Neomycin-Bacitracin Zn-Polymyx Anaphylaxis  . Clarithromycin Other (See Comments)    THRUSH  . Other Other (See Comments)    ANTIBIOTIC OINT - UNSURE OF NAME  GI UPSET  . Raloxifene Other (See Comments)    Back pain PT NOT SURE ABOUT THIS REACTION  . Tetracyclines & Related     Other reaction(s): Other SERVER INDIGESTION  . Floxin [Ofloxacin] Hives  . Neosporin [Neomycin-Polymyxin-Gramicidin] Hives      Review of Systems:  Constitutional: No recent illness  HEENT: No  headache, no vision change  Cardiac: No  chest pain, No  pressure, No palpitations  Respiratory:  No  shortness of breath. No  Cough  Gastrointestinal: No  abdominal pain, no change on bowel habits  Musculoskeletal: No new myalgia/arthralgia  Skin: No  Rash  Hem/Onc: No  easy bruising/bleeding  Neurologic: No  weakness, No  Dizziness   Exam:  BP 115/80 (BP Location: Left Arm, Patient Position: Sitting, Cuff Size: Normal)   Pulse 72   Temp 98.1 F (36.7 C) (Oral)   Wt 127 lb 14.4 oz (58 kg)   BMI 24.17 kg/m   Constitutional: VS see above. General Appearance: alert, well-developed, well-nourished, NAD  Eyes: Normal lids and conjunctive, non-icteric sclera  Ears, Nose, Mouth, Throat: MMM, Normal external inspection ears/nares/mouth/lips/gums. Tongue on sides still yellowish   Neck: No masses, trachea midline.   Respiratory: Normal respiratory effort. no wheeze, no rhonchi, no rales  Cardiovascular: S1/S2 normal, no murmur, no rub/gallop auscultated. RRR.   Musculoskeletal: Gait normal. Symmetric and independent movement of all extremities  Neurological: Normal balance/coordination. No  tremor.  Skin: warm, dry, intact.   Psychiatric: Normal judgment/insight. Normal mood and affect. Oriented x3.   Visit summary with medication list and pertinent instructions was printed for patient to review, advised to alert [place] if any changes needed. All questions at time of visit were answered - patient instructed to contact office with any additional concerns. ER/RTC precautions were reviewed with the patient and understanding verbalized.   Follow-up plan: Return in about 3 months (around 11/07/2018) for [place] - sooner if needed .  Note: Total time spent 25 minutes, greater than 50% of the visit was spent face-to-face counseling and coordinating care for the following: The primary encounter diagnosis was Anemia, unspecified type. Diagnoses of Thrush and Hoarseness were also pertinent to this visit.[name]  Please note: voice recognition software was used to produce this document, and typos may escape review. Please contact Dr. Sheppard Coil for any needed clarifications.

## 2018-08-08 NOTE — Patient Instructions (Signed)
Will try diflucan higher dose as well as clotrimazole oral troches Finish the stool test See GI as directed

## 2018-08-12 ENCOUNTER — Other Ambulatory Visit: Payer: Self-pay | Admitting: Osteopathic Medicine

## 2018-08-20 ENCOUNTER — Telehealth: Payer: Self-pay

## 2018-08-20 DIAGNOSIS — B370 Candidal stomatitis: Secondary | ICD-10-CM

## 2018-08-20 DIAGNOSIS — N898 Other specified noninflammatory disorders of vagina: Secondary | ICD-10-CM

## 2018-08-20 NOTE — Telephone Encounter (Signed)
Pt at [facility] and is still having issue with thrust.. Pt states it is hard to eat and she cannot seem to get rid of it.   [facility]. I have added pharmacy to her list.  Please review and send an RX if apropriate

## 2018-08-21 MED ORDER — LIDOCAINE HCL URETHRAL/MUCOSAL 2 % EX GEL: 1.0000 "application " | CUTANEOUS | 0 refills | Status: DC | PRN

## 2018-08-21 MED ORDER — NYSTATIN 100000 UNIT/ML MT SUSP: OROMUCOSAL | 0 refills | Status: DC

## 2018-08-21 NOTE — Telephone Encounter (Signed)
Patient is undergone multiple treatments for thrush, I am going to send her to an infectious disease specialist.  We will refill the nystatin one more time and I am also going to send some viscous lidocaine in to help with the pain.

## 2018-08-21 NOTE — Telephone Encounter (Signed)
Pt advised- ok with referral. Will get RX

## 2018-09-06 ENCOUNTER — Other Ambulatory Visit: Payer: Self-pay | Admitting: Osteopathic Medicine

## 2018-09-06 NOTE — Telephone Encounter (Signed)
Seen by this provider at [facility] not [place] [facility] [place].

## 2018-09-07 ENCOUNTER — Other Ambulatory Visit: Payer: Self-pay | Admitting: Osteopathic Medicine

## 2018-09-07 NOTE — Telephone Encounter (Signed)
Please review for refill.  Omeprazole 40 mg This patient was seen at [facility]. Provider N. Sheppard Coil

## 2018-09-13 DIAGNOSIS — D23122 Other benign neoplasm of skin of left lower eyelid, including canthus: Secondary | ICD-10-CM | POA: Diagnosis not present

## 2018-09-16 DIAGNOSIS — D2310 Other benign neoplasm of skin of unspecified eyelid, including canthus: Secondary | ICD-10-CM | POA: Insufficient documentation

## 2018-09-17 DIAGNOSIS — J479 Bronchiectasis, uncomplicated: Secondary | ICD-10-CM | POA: Diagnosis not present

## 2018-09-17 DIAGNOSIS — J4530 Mild persistent asthma, uncomplicated: Secondary | ICD-10-CM | POA: Diagnosis not present

## 2018-09-17 DIAGNOSIS — R918 Other nonspecific abnormal finding of lung field: Secondary | ICD-10-CM | POA: Diagnosis not present

## 2018-09-19 DIAGNOSIS — H01025 Squamous blepharitis left lower eyelid: Secondary | ICD-10-CM | POA: Diagnosis not present

## 2018-09-19 DIAGNOSIS — H01022 Squamous blepharitis right lower eyelid: Secondary | ICD-10-CM | POA: Diagnosis not present

## 2018-09-19 DIAGNOSIS — H01024 Squamous blepharitis left upper eyelid: Secondary | ICD-10-CM | POA: Diagnosis not present

## 2018-09-19 DIAGNOSIS — H01021 Squamous blepharitis right upper eyelid: Secondary | ICD-10-CM | POA: Diagnosis not present

## 2018-09-19 DIAGNOSIS — H00025 Hordeolum internum left lower eyelid: Secondary | ICD-10-CM | POA: Diagnosis not present

## 2018-09-20 DIAGNOSIS — M4325 Fusion of spine, thoracolumbar region: Secondary | ICD-10-CM | POA: Diagnosis not present

## 2018-09-21 NOTE — Progress Notes (Deleted)
Subjective:   Margaret [name] is a 72 y.o. female who presents for Medicare Annual (Subsequent) preventive examination.  Review of Systems:  No ROS.  Medicare Wellness Visit. Additional risk factors are reflected in the social history.    Sleep patterns: Home Safety/Smoke Alarms: Feels safe in home. Smoke alarms in place.  Living environment;     Female:   Pap- aged out      [place]-  utd     Dexa scan-  utd      [facility]- utd     Objective:     Vitals: There were no vitals taken for this visit.  There is no height or weight on file to calculate BMI.  Advanced Directives 03/09/2018 09/04/2017 02/06/2017 11/23/2016 09/08/2015  Does Patient Have a Medical Advance Directive? No No No No No  Would patient like information on creating a medical advance directive? No - Patient declined No - Patient declined No - Patient declined No - Patient declined No - patient declined information    Tobacco Social History   Tobacco Use  Smoking Status Former Smoker  . Packs/day: 1.50  . Years: 40.00  . Pack years: 60.00  . Last attempt to quit: 02/01/2008  . Years since quitting: 10.6  Smokeless Tobacco Never Used     Counseling given: Not Answered   Clinical Intake:                       Past Medical History:  Diagnosis Date  . Anemia   . Anxiety   . Arthritis   . Asthma   . Cancer ([place])   . COPD (chronic obstructive pulmonary disease) ([place])   . Emphysema of lung ([place])   . GERD (gastroesophageal reflux disease)   . Hypertension   . Osteoporosis   . Thyroid disease    Past Surgical History:  Procedure Laterality Date  . ABDOMINAL HYSTERECTOMY    . BREAST SURGERY    . CESAREAN SECTION    . JOINT REPLACEMENT     LT KNEE  . TOTAL KNEE ARTHROPLASTY  2006   Family History  Problem Relation Age of Onset  . Stroke Other   . Emphysema Mother 73       Death cause was emphysema.  . Alcohol abuse Father   . COPD Sister   . Alcohol abuse Brother 58  . Cirrhosis  Brother   . Cancer Maternal Grandmother 81       Stomach  . Stroke Maternal Grandfather 75   Social History   Socioeconomic History  . Marital status: Married    Spouse name: Not on file  . Number of children: Not on file  . Years of education: Not on file  . Highest education level: Not on file  Occupational History  . Not on file  Social Needs  . Financial resource strain: Not on file  . Food insecurity:    Worry: Not on file    Inability: Not on file  . Transportation needs:    Medical: Not on file    Non-medical: Not on file  Tobacco Use  . Smoking status: Former Smoker    Packs/day: 1.50    Years: 40.00    Pack years: 60.00    Last attempt to quit: 02/01/2008    Years since quitting: 10.6  . Smokeless tobacco: Never Used  Substance and Sexual Activity  . Alcohol use: Yes    Comment: SOCIALLY  . Drug use: No  .  Sexual activity: Yes    Birth control/protection: None  Lifestyle  . Physical activity:    Days per week: Not on file    Minutes per session: Not on file  . Stress: Not on file  Relationships  . Social connections:    Talks on phone: Not on file    Gets together: Not on file    Attends religious service: Not on file    Active member of club or organization: Not on file    Attends meetings of clubs or organizations: Not on file    Relationship status: Not on file  Other Topics Concern  . Not on file  Social History Narrative   ** Merged History Encounter **        Outpatient Encounter Medications as of 10/02/2018  Medication Sig  . albuterol (PROVENTIL HFA;VENTOLIN HFA) 108 (90 Base) MCG/ACT inhaler Inhale 1-2 puffs into the lungs every 4 (four) hours as needed for wheezing.  [name] amLODipine (NORVASC) 10 MG tablet TAKE 1 TABLET BY MOUTH EVERY DAY  . bisacodyl (DULCOLAX) 10 MG suppository Place 1 suppository (10 mg total) rectally daily as needed for moderate constipation.  . docusate sodium (COLACE) 100 MG capsule Take 1 capsule (100 mg total) by  mouth 3 (three) times daily.  . ferrous sulfate 325 (65 FE) MG EC tablet Take by mouth.  Cristy Friedlander HFA 220 MCG/ACT inhaler   . FLUoxetine (PROZAC) 20 MG capsule Take 1 capsule (20 mg total) by mouth daily.  [name] levothyroxine (SYNTHROID, LEVOTHROID) 50 MCG tablet Take 1 tablet (50 mcg total) by mouth daily.  [name] lidocaine (XYLOCAINE) 2 % jelly Apply 1 application topically as needed.  . linaclotide (LINZESS) 145 MCG CAPS capsule Take 1 capsule (145 mcg total) by mouth daily. To help with constipation.  [name] losartan (COZAAR) 100 MG tablet Take 1 tablet (100 mg total) by mouth daily.  . montelukast (SINGULAIR) 10 MG tablet Take 10 mg by mouth daily.  [name] nystatin (MYCOSTATIN) 100000 UNIT/ML suspension TAKE 5 MLS BY MOUTH 4 TIMES DAILY FOR 14 DAYS. SWISH FOR 30 SECONDS AND SPIT OUT.  [name] omeprazole (PRILOSEC) 40 MG capsule TAKE 1 CAPSULE BY MOUTH TWICE A DAY BEFORE MEALS  . oxyCODONE-acetaminophen (PERCOCET/ROXICET) 5-325 MG tablet Take 1 tablet by mouth every 4 (four) hours as needed. for pain  . pregabalin (LYRICA) 150 MG capsule Take by mouth.  . Tiotropium Bromide-Olodaterol 2.5-2.5 MCG/ACT AERS Inhale 2 puffs into the lungs daily.   No facility-administered encounter medications on file as of 10/02/2018.     Activities of Daily Living No flowsheet data found.  Patient Care Team: Emeterio Reeve, DO as PCP - General (Osteopathic Medicine)    Assessment:   This is a routine wellness examination for Margaret [name].Physical assessment deferred to PCP.   Exercise Activities and Dietary recommendations   Diet  Breakfast: Lunch:  Dinner:       Goals    . Exercise 3x per week (30 min per time) (pt-stated)     She would like to begin going to the fitness center that her insurance pays for for at least 30 minutes for 3 times per week.       Fall Risk Fall Risk  02/06/2017 11/03/2016  Falls in the past year? No No   Is the patient's home free of loose throw rugs in walkways, pet beds, electrical  cords, etc?   {Blank single:19197::"yes","no"}      Grab bars in the bathroom? {Blank single:19197::"yes","no"}  Handrails on the stairs?   {Blank single:19197::"yes","no"}      Adequate lighting?   {Blank single:19197::"yes","no"}  Depression Screen PHQ 2/9 Scores 06/08/2018 05/18/2017 02/08/2017 02/06/2017  PHQ - 2 Score 0 0 0 0  PHQ- 9 Score 6 2 8  -     Cognitive Function     6CIT Screen 02/06/2017  What Year? 0 points  What month? 0 points  What time? 0 points  Count back from 20 0 points  Months in reverse 0 points  Repeat phrase 0 points  Total Score 0    Immunization History  Administered Date(s) Administered  . Influenza, High Dose Seasonal PF 09/03/2017  . Influenza,inj,Quad PF,6+ Mos 10/14/2013, 11/04/2014, 08/18/2015, 11/03/2016  . Influenza-Unspecified 10/14/2013, 11/04/2014, 08/18/2015, 11/03/2016  . Pneumococcal Conjugate-13 07/08/2016  . Pneumococcal Polysaccharide-23 09/10/2012  . Pneumococcal-Unspecified 09/04/2012  . Tdap 10/31/2011, 05/26/2018    Screening Tests Health Maintenance  Topic Date Due  . INFLUENZA VACCINE  07/05/2018  . MAMMOGRAM  05/16/2020  . COLONOSCOPY  12/31/2022  . TETANUS/TDAP  05/26/2028  . DEXA SCAN  Completed  . Hepatitis C Screening  Completed  . PNA vac Low Risk Adult  Completed       Plan:   ***   I have personally reviewed and noted the following in the patient's chart:   . Medical and social history . Use of alcohol, tobacco or illicit drugs  . Current medications and supplements . Functional ability and status . Nutritional status . Physical activity . Advanced directives . List of other physicians . Hospitalizations, surgeries, and ER visits in previous 12 months . Vitals . Screenings to include cognitive, depression, and falls . Referrals and appointments  In addition, I have reviewed and discussed with patient certain preventive protocols, quality metrics, and best practice recommendations. A written  personalized care plan for preventive services as well as general preventive health recommendations were provided to patient.     Joanne Chars, LPN  [id number]

## 2018-09-24 DIAGNOSIS — H01012 Ulcerative blepharitis right lower eyelid: Secondary | ICD-10-CM | POA: Diagnosis not present

## 2018-09-24 DIAGNOSIS — H01011 Ulcerative blepharitis right upper eyelid: Secondary | ICD-10-CM | POA: Diagnosis not present

## 2018-09-24 DIAGNOSIS — H01015 Ulcerative blepharitis left lower eyelid: Secondary | ICD-10-CM | POA: Diagnosis not present

## 2018-09-24 DIAGNOSIS — H01014 Ulcerative blepharitis left upper eyelid: Secondary | ICD-10-CM | POA: Diagnosis not present

## 2018-10-02 ENCOUNTER — Ambulatory Visit: Payer: Medicare HMO

## 2018-10-08 DIAGNOSIS — H01012 Ulcerative blepharitis right lower eyelid: Secondary | ICD-10-CM | POA: Diagnosis not present

## 2018-10-08 DIAGNOSIS — H01015 Ulcerative blepharitis left lower eyelid: Secondary | ICD-10-CM | POA: Diagnosis not present

## 2018-10-08 DIAGNOSIS — H01014 Ulcerative blepharitis left upper eyelid: Secondary | ICD-10-CM | POA: Diagnosis not present

## 2018-10-08 DIAGNOSIS — H01011 Ulcerative blepharitis right upper eyelid: Secondary | ICD-10-CM | POA: Diagnosis not present

## 2018-10-22 ENCOUNTER — Encounter: Payer: Self-pay | Admitting: Osteopathic Medicine

## 2018-10-28 ENCOUNTER — Other Ambulatory Visit: Payer: Self-pay | Admitting: Osteopathic Medicine

## 2018-10-31 DIAGNOSIS — L292 Pruritus vulvae: Secondary | ICD-10-CM | POA: Diagnosis not present

## 2018-10-31 DIAGNOSIS — N904 Leukoplakia of vulva: Secondary | ICD-10-CM | POA: Diagnosis not present

## 2018-10-31 DIAGNOSIS — N905 Atrophy of vulva: Secondary | ICD-10-CM | POA: Diagnosis not present

## 2018-11-06 ENCOUNTER — Other Ambulatory Visit: Payer: Self-pay | Admitting: Osteopathic Medicine

## 2018-11-06 DIAGNOSIS — I10 Essential (primary) hypertension: Secondary | ICD-10-CM

## 2018-11-06 DIAGNOSIS — M4712 Other spondylosis with myelopathy, cervical region: Secondary | ICD-10-CM | POA: Diagnosis not present

## 2018-11-06 DIAGNOSIS — M4325 Fusion of spine, thoracolumbar region: Secondary | ICD-10-CM | POA: Diagnosis not present

## 2018-11-06 DIAGNOSIS — M542 Cervicalgia: Secondary | ICD-10-CM | POA: Diagnosis not present

## 2018-11-07 ENCOUNTER — Other Ambulatory Visit: Payer: Self-pay | Admitting: Osteopathic Medicine

## 2018-11-07 DIAGNOSIS — I10 Essential (primary) hypertension: Secondary | ICD-10-CM

## 2018-11-07 NOTE — Telephone Encounter (Signed)
Please review for refill- patient at [facility] 

## 2018-11-08 DIAGNOSIS — H01015 Ulcerative blepharitis left lower eyelid: Secondary | ICD-10-CM | POA: Diagnosis not present

## 2018-11-08 DIAGNOSIS — H01012 Ulcerative blepharitis right lower eyelid: Secondary | ICD-10-CM | POA: Diagnosis not present

## 2018-11-08 DIAGNOSIS — H01011 Ulcerative blepharitis right upper eyelid: Secondary | ICD-10-CM | POA: Diagnosis not present

## 2018-11-08 DIAGNOSIS — H01014 Ulcerative blepharitis left upper eyelid: Secondary | ICD-10-CM | POA: Diagnosis not present

## 2018-11-13 DIAGNOSIS — M4712 Other spondylosis with myelopathy, cervical region: Secondary | ICD-10-CM | POA: Diagnosis not present

## 2018-11-13 DIAGNOSIS — M4802 Spinal stenosis, cervical region: Secondary | ICD-10-CM | POA: Diagnosis not present

## 2018-11-13 DIAGNOSIS — M47812 Spondylosis without myelopathy or radiculopathy, cervical region: Secondary | ICD-10-CM | POA: Diagnosis not present

## 2018-11-13 DIAGNOSIS — M542 Cervicalgia: Secondary | ICD-10-CM | POA: Diagnosis not present

## 2018-11-20 DIAGNOSIS — M961 Postlaminectomy syndrome, not elsewhere classified: Secondary | ICD-10-CM | POA: Diagnosis not present

## 2018-11-20 DIAGNOSIS — M4726 Other spondylosis with radiculopathy, lumbar region: Secondary | ICD-10-CM | POA: Diagnosis not present

## 2018-11-20 DIAGNOSIS — M4325 Fusion of spine, thoracolumbar region: Secondary | ICD-10-CM | POA: Diagnosis not present

## 2018-12-12 ENCOUNTER — Other Ambulatory Visit: Payer: Self-pay | Admitting: Osteopathic Medicine

## 2018-12-13 NOTE — Telephone Encounter (Signed)
[facility]. Last thyroid check was 07/11/18. Pls advise, thanks.

## 2018-12-13 NOTE — Telephone Encounter (Signed)
Pt has been updated. No other inquiries during call.  

## 2018-12-30 ENCOUNTER — Other Ambulatory Visit: Payer: Self-pay | Admitting: Osteopathic Medicine

## 2019-01-04 DIAGNOSIS — M25552 Pain in left hip: Secondary | ICD-10-CM | POA: Diagnosis not present

## 2019-01-04 DIAGNOSIS — M545 Low back pain: Secondary | ICD-10-CM | POA: Diagnosis not present

## 2019-01-04 DIAGNOSIS — G894 Chronic pain syndrome: Secondary | ICD-10-CM | POA: Diagnosis not present

## 2019-01-04 DIAGNOSIS — M25551 Pain in right hip: Secondary | ICD-10-CM | POA: Diagnosis not present

## 2019-01-28 ENCOUNTER — Other Ambulatory Visit: Payer: Self-pay | Admitting: Osteopathic Medicine

## 2019-02-01 ENCOUNTER — Encounter: Payer: Self-pay | Admitting: Physician Assistant

## 2019-02-01 ENCOUNTER — Other Ambulatory Visit: Payer: Self-pay | Admitting: Osteopathic Medicine

## 2019-02-01 ENCOUNTER — Ambulatory Visit (INDEPENDENT_AMBULATORY_CARE_PROVIDER_SITE_OTHER): Payer: Medicare HMO | Admitting: Physician Assistant

## 2019-02-01 VITALS — BP 130/84 | HR 68 | Temp 97.6°F | Wt 130.1 lb

## 2019-02-01 DIAGNOSIS — F418 Other specified anxiety disorders: Secondary | ICD-10-CM

## 2019-02-01 DIAGNOSIS — I10 Essential (primary) hypertension: Secondary | ICD-10-CM | POA: Diagnosis not present

## 2019-02-01 DIAGNOSIS — N289 Disorder of kidney and ureter, unspecified: Secondary | ICD-10-CM | POA: Diagnosis not present

## 2019-02-01 DIAGNOSIS — J4531 Mild persistent asthma with (acute) exacerbation: Secondary | ICD-10-CM

## 2019-02-01 MED ORDER — IPRATROPIUM-ALBUTEROL 0.5-2.5 (3) MG/3ML IN SOLN: 3.0000 mL | Freq: Four times a day (QID) | RESPIRATORY_TRACT | Status: DC

## 2019-02-01 MED ORDER — AMLODIPINE BESYLATE 10 MG PO TABS: 10.0000 mg | ORAL_TABLET | Freq: Every day | ORAL | 0 refills | Status: DC

## 2019-02-01 MED ORDER — PREDNISONE 20 MG PO TABS: 40.0000 mg | ORAL_TABLET | Freq: Every day | ORAL | 0 refills | Status: AC

## 2019-02-01 MED ORDER — FLUOXETINE HCL 40 MG PO CAPS: 40.0000 mg | ORAL_CAPSULE | Freq: Every day | ORAL | 0 refills | Status: DC

## 2019-02-01 NOTE — Progress Notes (Signed)
HPI:                                                                Margaret [name] is a 73 y.o. female who presents to [name] today for cough and shortness of breath  Asthma  She complains of cough, shortness of breath and wheezing. There is no difficulty breathing, hemoptysis or sputum production. This is a chronic problem. The current episode started in the past 7 days. The cough is productive of sputum. Associated symptoms include nasal congestion. Pertinent negatives include no chest pain, ear pain, fever, malaise/fatigue, myalgias, rhinorrhea or sore throat. Relieved by: Mucinex. Her past medical history is significant for asthma.  She is using her Albuterol 4 times per day.  Reports she has had increased anxiety and "hysteria" so she self-increased her Prozac to 40 mg. Reports her back pain is the main trigger of her anxiety.  Past Medical History:  Diagnosis Date  . Anemia   . Anxiety   . Arthritis   . Asthma   . Cancer ([place])   . COPD (chronic obstructive pulmonary disease) ([place])   . Emphysema of lung ([place])   . GERD (gastroesophageal reflux disease)   . Hypertension   . Osteoporosis   . Thyroid disease    Past Surgical History:  Procedure Laterality Date  . ABDOMINAL HYSTERECTOMY    . BREAST SURGERY    . CESAREAN SECTION    . JOINT REPLACEMENT     LT KNEE  . TOTAL KNEE ARTHROPLASTY  2006   Social History   Tobacco Use  . Smoking status: Former Smoker    Packs/day: 1.50    Years: 40.00    Pack years: 60.00    Last attempt to quit: 02/01/2008    Years since quitting: 11.0  . Smokeless tobacco: Never Used  Substance Use Topics  . Alcohol use: Yes    Comment: SOCIALLY   family history includes Alcohol abuse in her father; Alcohol abuse (age of onset: 65) in her brother; COPD in her sister; Cancer (age of onset: 61) in her maternal grandmother; Cirrhosis in her brother; Emphysema (age of onset: 35) in her mother;  Stroke in an other family member; Stroke (age of onset: 63) in her maternal grandfather.   Review of Systems  Constitutional: Negative for chills, diaphoresis, fever and malaise/fatigue.  HENT: Positive for congestion. Negative for ear pain, rhinorrhea, sinus pain and sore throat.   Respiratory: Positive for cough, shortness of breath and wheezing. Negative for hemoptysis and sputum production.   Cardiovascular: Negative for chest pain.  Gastrointestinal: Negative for abdominal pain, nausea and vomiting.  Musculoskeletal: Negative for myalgias.  Skin: Negative for rash.  All other systems reviewed and are negative.    Medications: Current Outpatient Medications  Medication Sig Dispense Refill  . albuterol (PROVENTIL HFA;VENTOLIN HFA) 108 (90 Base) MCG/ACT inhaler Inhale 1-2 puffs into the lungs every 4 (four) hours as needed for wheezing. 1 Inhaler 0  . amLODipine (NORVASC) 10 MG tablet Take 1 tablet (10 mg total) by mouth daily. 30 tablet 0  . ferrous sulfate 325 (65 FE) MG EC tablet Take by mouth.    Cristy Friedlander HFA 220 MCG/ACT inhaler     . FLUoxetine (PROZAC)  40 MG capsule Take 1 capsule (40 mg total) by mouth daily. 30 capsule 0  . levothyroxine (SYNTHROID, LEVOTHROID) 50 MCG tablet TAKE 1 TABLET BY MOUTH EVERY DAY 90 tablet 1  . lidocaine (XYLOCAINE) 2 % jelly Apply 1 application topically as needed. 30 mL 0  . montelukast (SINGULAIR) 10 MG tablet Take 10 mg by mouth daily.  2  . omeprazole (PRILOSEC) 40 MG capsule TAKE 1 CAPSULE BY MOUTH TWICE A DAY BEFORE MEALS 60 capsule 2  . Tiotropium Bromide-Olodaterol 2.5-2.5 MCG/ACT AERS Inhale 2 puffs into the lungs daily.    [name] oxyCODONE-acetaminophen (PERCOCET/ROXICET) 5-325 MG tablet Take 1 tablet by mouth every 4 (four) hours as needed. for pain  0  . predniSONE (DELTASONE) 20 MG tablet Take 2 tablets (40 mg total) by mouth daily with breakfast for 5 days. 10 tablet 0  . pregabalin (LYRICA) 150 MG capsule Take by mouth.     Current  Facility-Administered Medications  Medication Dose Route Frequency Provider Last Rate Last Dose  . ipratropium-albuterol (DUONEB) 0.5-2.5 (3) MG/3ML nebulizer solution 3 mL  3 mL Nebulization Q6H Trixie Dredge, PA-C       Allergies  Allergen Reactions  . Neomycin-Bacitracin Zn-Polymyx Anaphylaxis  . Clarithromycin Other (See Comments)    THRUSH  . Other Other (See Comments)    ANTIBIOTIC OINT - UNSURE OF NAME  GI UPSET    . Raloxifene Other (See Comments)    Back pain/ PT NOT SURE ABOUT THIS REACTION  PT NOT SURE ABOUT THIS REACTION  . Tetracyclines & Related     Other reaction(s): Other SERVER INDIGESTION  . Neosporin [Neomycin-Polymyxin-Gramicidin] Hives  . Ofloxacin Hives and Nausea And Vomiting  . Tetracycline Other (See Comments)       Objective:  BP 130/84 (BP Location: Left Arm, Patient Position: Sitting, Cuff Size: Normal)   Pulse 68   Temp 97.6 F (36.4 C) (Oral)   Wt 130 lb 1.6 oz (59 kg)   SpO2 98%   BMI 24.58 kg/m  Gen:  alert, not ill-appearing, no distress, appropriate for age HEENT: head normocephalic without obvious abnormality, conjunctiva and cornea clear, trachea midline Pulm: Normal work of breathing, normal phonation, coarse breath sounds, expiratory wheezes and rhonchi diffusely CV: Normal rate, regular rhythm, s1 and s2 distinct, no murmurs, clicks or rubs  Neuro: alert and oriented x 3, no tremor MSK: extremities atraumatic, normal gait and station Skin: intact, no rashes on exposed skin, no jaundice, no cyanosis   Lab Results  Component Value Date   CREATININE 1.03 (H) 07/11/2018   BUN 20 07/11/2018   NA 137 07/11/2018   K 4.6 07/11/2018   CL 103 07/11/2018   CO2 24 07/11/2018     No results found for this or any previous visit (from the past 72 hour(s)). No results found.    Assessment and Plan: 73 y.o. female with   .Diagnoses and all orders for this visit:  Mild persistent asthma with acute exacerbation -      predniSONE (DELTASONE) 20 MG tablet; Take 2 tablets (40 mg total) by mouth daily with breakfast for 5 days. -     ipratropium-albuterol (DUONEB) 0.5-2.5 (3) MG/3ML nebulizer solution 3 mL  Renal insufficiency -     BASIC METABOLIC PANEL WITH GFR  Essential hypertension, benign -     amLODipine (NORVASC) 10 MG tablet; Take 1 tablet (10 mg total) by mouth daily.  Depression with anxiety -     FLUoxetine (PROZAC) 40 MG capsule;  Take 1 capsule (40 mg total) by mouth daily.   Afebrile, no tachypnea, no tachycardia, pulse ox 96% on room air at rest, diffusely coarse lung sounds with end expiratory wheezes and rhonchi consistent with mild asthma exacerbation No fever, chest pain, purulent sputum to suggest pneumonia.  Chest x-ray deferred today Treating with prednisone burst 40 mg for 5 days DuoNeb given in office  Patient also requesting refills of amlodipine and fluoxetine today.  She will run out of the fluoxetine early because she has been doubling her dose due to increased anxiety. Rechecking renal function due to mildly decreased GFR in August 30-day supply of medication sent to pharmacy.  Patient counseled to follow-up with her PCP in 2 weeks  Patient education and anticipatory guidance given Patient agrees with treatment plan Follow-up with PCP in 2 weeks or sooner as needed if symptoms worsen or fail to improve  Darlyne Russian PA-C

## 2019-02-01 NOTE — Patient Instructions (Signed)
Bronchospasm, Adult  Bronchospasm is a tightening of the airways going into the lungs. During an episode, it may be harder to breathe. You may cough, and you may make a whistling sound when you breathe (wheeze). This condition often affects people with asthma. What are the causes? This condition is caused by swelling and irritation in the airways. It can be triggered by:  An infection (common).  Seasonal allergies.  An allergic reaction.  Exercise.  Irritants. These include pollution, cigarette smoke, strong odors, aerosol sprays, and paint fumes.  Weather changes. Winds increase molds and pollens in the air. Cold air may cause swelling.  Stress and emotional upset. What are the signs or symptoms? Symptoms of this condition include:  Wheezing. If the episode was triggered by an allergy, wheezing may start right away or hours later.  Nighttime coughing.  Frequent or severe coughing with a simple cold.  Chest tightness.  Shortness of breath.  Decreased ability to exercise. How is this diagnosed? This condition is usually diagnosed with a review of your medical history and a physical exam. Tests, such as lung function tests, are sometimes done to look for other conditions. The need for a chest X-ray depends on where the wheezing occurs and whether it is the first time you have wheezed. How is this treated? This condition may be treated with:  Inhaled medicines. These open up the airways and help you breathe. They can be taken with an inhaler or a nebulizer device.  Corticosteroid medicines. These may be given for severe bronchospasm, usually when it is associated with asthma.  Avoiding triggers, such as irritants, infection, or allergies. Follow these instructions at home: Medicines  Take over-the-counter and prescription medicines only as told by your health care provider.  If you need to use an inhaler or nebulizer to take your medicine, ask your health care provider  to explain how to use it correctly. If you were given a spacer, always use it with your inhaler. Lifestyle  Reduce the number of triggers in your home. To do this: ? Change your heating and air conditioning filter at least once a month. ? Limit your use of fireplaces and wood stoves. ? Do not smoke. Do not allow smoking in your home. ? Avoid using perfumes and fragrances. ? Get rid of pests, such as roaches and mice, and their droppings. ? Remove any mold from your home. ? Keep your house clean and dust free. Use unscented cleaning products. ? Replace carpet with wood, tile, or vinyl flooring. Carpet can trap dander and dust. ? Use allergy-proof pillows, mattress covers, and box spring covers. ? Wash bed sheets and blankets every week in hot water. Dry them in a dryer. ? Use blankets that are made of polyester or cotton. ? Wash your hands often. ? Do not allow pets in your bedroom.  Avoid breathing in cold air when you exercise. General instructions  Have a plan for seeking medical care. Know when to call your health care provider and local emergency services, and where to get emergency care.  Stay up to date on your immunizations.  When you have an episode of bronchospasm, stay calm. Try to relax and breathe more slowly.  If you have asthma, make sure you have an asthma action plan.  Keep all follow-up visits as told by your health care provider. This is important. Contact a health care provider if:  You have muscle aches.  You have chest pain.  The mucus that you cough up (  If you have asthma, make sure you have an asthma action plan.  · Keep all follow-up visits as told by your health care provider. This is important.  Contact a health care provider if:  · You have muscle aches.  · You have chest pain.  · The mucus that you cough up (sputum) changes from clear or white to yellow, green, gray, or bloody.  · You have a fever.  · Your sputum gets thicker.  Get help right away if:  · Your wheezing and coughing get worse, even after you take your prescribed medicines.  · It gets even harder to breathe.  · You develop severe chest pain.  Summary  · Bronchospasm is a tightening of the airways going into the lungs.  · During an episode of  bronchospasm, you may have a harder time breathing. You may cough and make a whistling sound when you breathe (wheeze).  · Avoid exposure to triggers such as smoke, dust, mold, animal dander, and fragrances.  · When you have an episode of bronchospasm, stay calm. Try to relax and breathe more slowly.  This information is not intended to replace advice given to you by your health care provider. Make sure you discuss any questions you have with your health care provider.  Document Released: 11/24/2003 Document Revised: 11/17/2016 Document Reviewed: 11/17/2016  Elsevier Interactive Patient Education © 2019 Elsevier Inc.

## 2019-02-01 NOTE — Telephone Encounter (Signed)
[facility] not refilling medications for Dr Sheppard Coil. Routing to rx refill pool.

## 2019-02-02 LAB — BASIC METABOLIC PANEL WITH GFR
BUN / CREAT RATIO: 16 (calc) (ref 6–22)
BUN: 18 mg/dL (ref 7–25)
CHLORIDE: 102 mmol/L (ref 98–110)
CO2: 28 mmol/L (ref 20–32)
CREATININE: 1.12 mg/dL — AB (ref 0.60–0.93)
Calcium: 9.8 mg/dL (ref 8.6–10.4)
GFR, Est African American: 57 mL/min/{1.73_m2} — ABNORMAL LOW (ref >=60)
GFR, Est Non African American: 49 mL/min/{1.73_m2} — ABNORMAL LOW (ref >=60)
GLUCOSE: 100 mg/dL — AB (ref 65–99)
POTASSIUM: 4 mmol/L (ref 3.5–5.3)
SODIUM: 138 mmol/L (ref 135–146)

## 2019-02-11 DIAGNOSIS — M25552 Pain in left hip: Secondary | ICD-10-CM | POA: Diagnosis not present

## 2019-02-11 DIAGNOSIS — M545 Low back pain: Secondary | ICD-10-CM | POA: Diagnosis not present

## 2019-02-11 DIAGNOSIS — G894 Chronic pain syndrome: Secondary | ICD-10-CM | POA: Diagnosis not present

## 2019-02-11 DIAGNOSIS — M4316 Spondylolisthesis, lumbar region: Secondary | ICD-10-CM | POA: Diagnosis not present

## 2019-02-11 DIAGNOSIS — M25551 Pain in right hip: Secondary | ICD-10-CM | POA: Diagnosis not present

## 2019-02-12 ENCOUNTER — Ambulatory Visit (INDEPENDENT_AMBULATORY_CARE_PROVIDER_SITE_OTHER): Payer: Medicare HMO | Admitting: Osteopathic Medicine

## 2019-02-12 ENCOUNTER — Encounter: Payer: Self-pay | Admitting: Osteopathic Medicine

## 2019-02-12 VITALS — BP 124/83 | HR 69 | Temp 98.2°F | Wt 129.4 lb

## 2019-02-12 DIAGNOSIS — J4531 Mild persistent asthma with (acute) exacerbation: Secondary | ICD-10-CM

## 2019-02-12 DIAGNOSIS — J019 Acute sinusitis, unspecified: Secondary | ICD-10-CM

## 2019-02-12 DIAGNOSIS — F418 Other specified anxiety disorders: Secondary | ICD-10-CM | POA: Diagnosis not present

## 2019-02-12 DIAGNOSIS — R7989 Other specified abnormal findings of blood chemistry: Secondary | ICD-10-CM | POA: Diagnosis not present

## 2019-02-12 MED ORDER — AMOXICILLIN-POT CLAVULANATE 875-125 MG PO TABS: 1.0000 | ORAL_TABLET | Freq: Two times a day (BID) | ORAL | 0 refills | Status: AC

## 2019-02-12 MED ORDER — FLUOXETINE HCL 40 MG PO CAPS: 40.0000 mg | ORAL_CAPSULE | Freq: Every day | ORAL | 3 refills | Status: DC

## 2019-02-12 MED ORDER — PREDNISONE 10 MG (48) PO TBPK: ORAL_TABLET | Freq: Every day | ORAL | 0 refills | Status: DC

## 2019-02-12 NOTE — Progress Notes (Signed)
HPI: Margaret [name] is a 73 y.o. female who  has a past medical history of Anemia, Anxiety, Arthritis, Asthma, Cancer ([place]), COPD (chronic obstructive pulmonary disease) ([place]), Emphysema of lung ([place]), GERD (gastroesophageal reflux disease), Hypertension, Osteoporosis, and Thyroid disease.  she presents to [facility] today, 02/12/19,  for chief complaint of:  Asthma follow-up  Recently seen by a colleague in my practice, Nelson Chimes, PA-C, on 02/01/2019, this was 11 days ago, for asthma exacerbation.  Started on prednisone burst, given DuoNeb in office.  Patient reports feeling overall better, still some wheezing and occasional shortness of breath, cough.  States that in the past longer steroid tapers of work for her.  She is having significant sinus headache/pressure  Also recently doubled her dose of fluoxetine due to increased anxiety.  Evlyn Clines wrote for 40 mg capsules and advised patient to follow-up with me.  They patient reports that anxiety is pretty well controlled.  Patient also had some concerns about elevated serum creatinine, was mildly high when we checked back in August, on recheck was a little bit worse but patient was also acutely ill    At today's visit 02/12/19 ... PMH, PSH, FH reviewed and updated as needed.  Current medication list and allergy/intolerance hx reviewed and updated as needed. (See remainder of HPI, ROS, Phys Exam below)         ASSESSMENT/PLAN: The primary encounter diagnosis was Mild persistent asthma with acute exacerbation. Diagnoses of Depression with anxiety, Acute non-recurrent sinusitis, unspecified location, and Elevated serum creatinine were also pertinent to this visit.   Orders Placed This Encounter  Procedures  . BASIC METABOLIC PANEL WITH GFR     Meds ordered this encounter  Medications  . predniSONE (STERAPRED UNI-PAK 48 TAB) 10 MG (48) TBPK tablet    Sig: Take by mouth daily. 12-Day taper,  po    Dispense:  48 tablet    Refill:  0  . amoxicillin-clavulanate (AUGMENTIN) 875-125 MG tablet    Sig: Take 1 tablet by mouth 2 (two) times daily for 10 days.    Dispense:  14 tablet    Refill:  0  . FLUoxetine (PROZAC) 40 MG capsule    Sig: Take 1 capsule (40 mg total) by mouth daily.    Dispense:  90 capsule    Refill:  3    Cancel 20 mg dose, thanks!    Patient Instructions  Plan:  Can trial antibiotics for a couple days  If still not feeling better, can start the steroids, or can start them now and let me know if your symptoms improve  In 1 to 2 weeks, when you are feeling better, let us go ahead and recheck fasting blood work for her sugars and recheck kidney function.  Be sure to stay well-hydrated with water prior to the blood draw.       Follow-up plan: Return for Lab visit only 1 to 2 weeks.  Otherwise, 05/2019 w/ Maudie Mercury for Medicare visit and after that w/ Dr A .                                                 ################################################# ################################################# ################################################# #################################################    Current Meds  Medication Sig  . albuterol (PROVENTIL HFA;VENTOLIN HFA) 108 (90 Base) MCG/ACT inhaler Inhale 1-2 puffs into the lungs every 4 (four)  hours as needed for wheezing.  [name] amLODipine (NORVASC) 10 MG tablet TAKE 1 TABLET (10 MG TOTAL) BY MOUTH DAILY. DUE FOR ANNUAL APPT  . ferrous sulfate 325 (65 FE) MG EC tablet Take by mouth.  [name] FLUoxetine (PROZAC) 40 MG capsule Take 1 capsule (40 mg total) by mouth daily.  [name] HYDROcodone-acetaminophen (NORCO) 10-325 MG tablet Take 1 tablet by mouth every 6 (six) hours as needed.  [name] levothyroxine (SYNTHROID, LEVOTHROID) 50 MCG tablet TAKE 1 TABLET BY MOUTH EVERY DAY  . lidocaine (XYLOCAINE) 2 % jelly Apply 1 application topically as needed.  . montelukast (SINGULAIR)  10 MG tablet Take 10 mg by mouth daily.  [name] omeprazole (PRILOSEC) 40 MG capsule TAKE 1 CAPSULE BY MOUTH TWICE A DAY BEFORE MEALS  . [DISCONTINUED] FLUoxetine (PROZAC) 40 MG capsule Take 1 capsule (40 mg total) by mouth daily.   Current Facility-Administered Medications for the 02/12/19 encounter (Office Visit) with Emeterio Reeve, DO  Medication  . ipratropium-albuterol (DUONEB) 0.5-2.5 (3) MG/3ML nebulizer solution 3 mL    Allergies  Allergen Reactions  . Neomycin-Bacitracin Zn-Polymyx Anaphylaxis  . Clarithromycin Other (See Comments)    THRUSH  . Other Other (See Comments)    ANTIBIOTIC OINT - UNSURE OF NAME  GI UPSET    . Raloxifene Other (See Comments)    Back pain/ PT NOT SURE ABOUT THIS REACTION  PT NOT SURE ABOUT THIS REACTION  . Tetracyclines & Related     Other reaction(s): Other SERVER INDIGESTION  . Neosporin [Neomycin-Polymyxin-Gramicidin] Hives  . Ofloxacin Hives and Nausea And Vomiting  . Tetracycline Other (See Comments)       Review of Systems:  Constitutional: +recent illness, no current fever/chills.  HEENT: +headache, no vision change  Cardiac: No  chest pain, No  pressure, No palpitations  Respiratory:  +shortness of breath. +Cough  Gastrointestinal: No  abdominal pain, no change on bowel habits  Musculoskeletal: No new myalgia/arthralgia  Neurologic: No  weakness, No  Dizziness  Psychiatric: No  concerns with depression, No  concerns with anxiety  Exam:  BP 124/83 (BP Location: Left Arm, Patient Position: Sitting, Cuff Size: Normal)   Pulse 69   Temp 98.2 F (36.8 C) (Oral)   Wt 129 lb 6.4 oz (58.7 kg)   BMI 24.45 kg/m   Constitutional: VS see above. General Appearance: alert, well-developed, well-nourished, NAD  Eyes: Normal lids and conjunctive, non-icteric sclera  Ears, Nose, Mouth, Throat: MMM, Normal external inspection ears/nares/mouth/lips/gums.  Neck: No masses, trachea midline.   Respiratory: Normal respiratory  effort. +diffuse wheeze, no rhonchi, no rales  Cardiovascular: S1/S2 normal, no murmur, no rub/gallop auscultated. RRR.   Musculoskeletal: Gait normal. Symmetric and independent movement of all extremities  Neurological: Normal balance/coordination. No tremor.  Skin: warm, dry, intact.   Psychiatric: Normal judgment/insight. Normal mood and affect. Oriented x3.       Visit summary with medication list and pertinent instructions was printed for patient to review, patient was advised to alert [place] if any updates are needed. All questions at time of visit were answered - patient instructed to contact office with any additional concerns. ER/RTC precautions were reviewed with the patient and understanding verbalized.   Note: Total time spent 25 minutes, greater than 50% of the visit was spent face-to-face counseling and coordinating care for the following: The primary encounter diagnosis was Mild persistent asthma with acute exacerbation. Diagnoses of Depression with anxiety, Acute non-recurrent sinusitis, unspecified location, and Elevated serum creatinine were also pertinent to this visit.[name]  Please note: voice recognition software was used to produce this document, and typos may escape review. Please contact Dr. Sheppard Coil for any needed clarifications.    Follow up plan: Return for Lab visit only 1 to 2 weeks.  Otherwise, 05/2019 w/ Maudie Mercury for Medicare visit and after that w/ Dr A .

## 2019-02-12 NOTE — Patient Instructions (Addendum)
Plan:  Can trial antibiotics for a couple days  If still not feeling better, can start the steroids, or can start them now and let me know if your symptoms improve  In 1 to 2 weeks, when you are feeling better, let us go ahead and recheck fasting blood work for her sugars and recheck kidney function.  Be sure to stay well-hydrated with water prior to the blood draw.

## 2019-03-20 DIAGNOSIS — J449 Chronic obstructive pulmonary disease, unspecified: Secondary | ICD-10-CM | POA: Diagnosis not present

## 2019-03-20 DIAGNOSIS — R918 Other nonspecific abnormal finding of lung field: Secondary | ICD-10-CM | POA: Diagnosis not present

## 2019-03-20 DIAGNOSIS — J479 Bronchiectasis, uncomplicated: Secondary | ICD-10-CM | POA: Diagnosis not present

## 2019-04-09 DIAGNOSIS — M25551 Pain in right hip: Secondary | ICD-10-CM | POA: Diagnosis not present

## 2019-04-09 DIAGNOSIS — M545 Low back pain: Secondary | ICD-10-CM | POA: Diagnosis not present

## 2019-04-09 DIAGNOSIS — M4316 Spondylolisthesis, lumbar region: Secondary | ICD-10-CM | POA: Diagnosis not present

## 2019-04-09 DIAGNOSIS — G894 Chronic pain syndrome: Secondary | ICD-10-CM | POA: Diagnosis not present

## 2019-04-25 ENCOUNTER — Other Ambulatory Visit: Payer: Self-pay | Admitting: Osteopathic Medicine

## 2019-04-25 ENCOUNTER — Ambulatory Visit: Payer: Medicare HMO | Admitting: Osteopathic Medicine

## 2019-04-25 DIAGNOSIS — I10 Essential (primary) hypertension: Secondary | ICD-10-CM

## 2019-04-26 ENCOUNTER — Other Ambulatory Visit: Payer: Self-pay | Admitting: Osteopathic Medicine

## 2019-05-02 IMAGING — DX DG KNEE 1-2V*R*
4 series · 4 of 4 positions shown · non-contrast
Comparison: Standing views of both knees and lateral views of the
right knee 11/05/2014.

CLINICAL DATA: Lateral left knee pain for 1 week. History of prior
knee replacement. No known injury.

EXAM:
LEFT KNEE - COMPLETE 4+ VIEW; RIGHT KNEE - 1-2 VIEW

[tunnel]
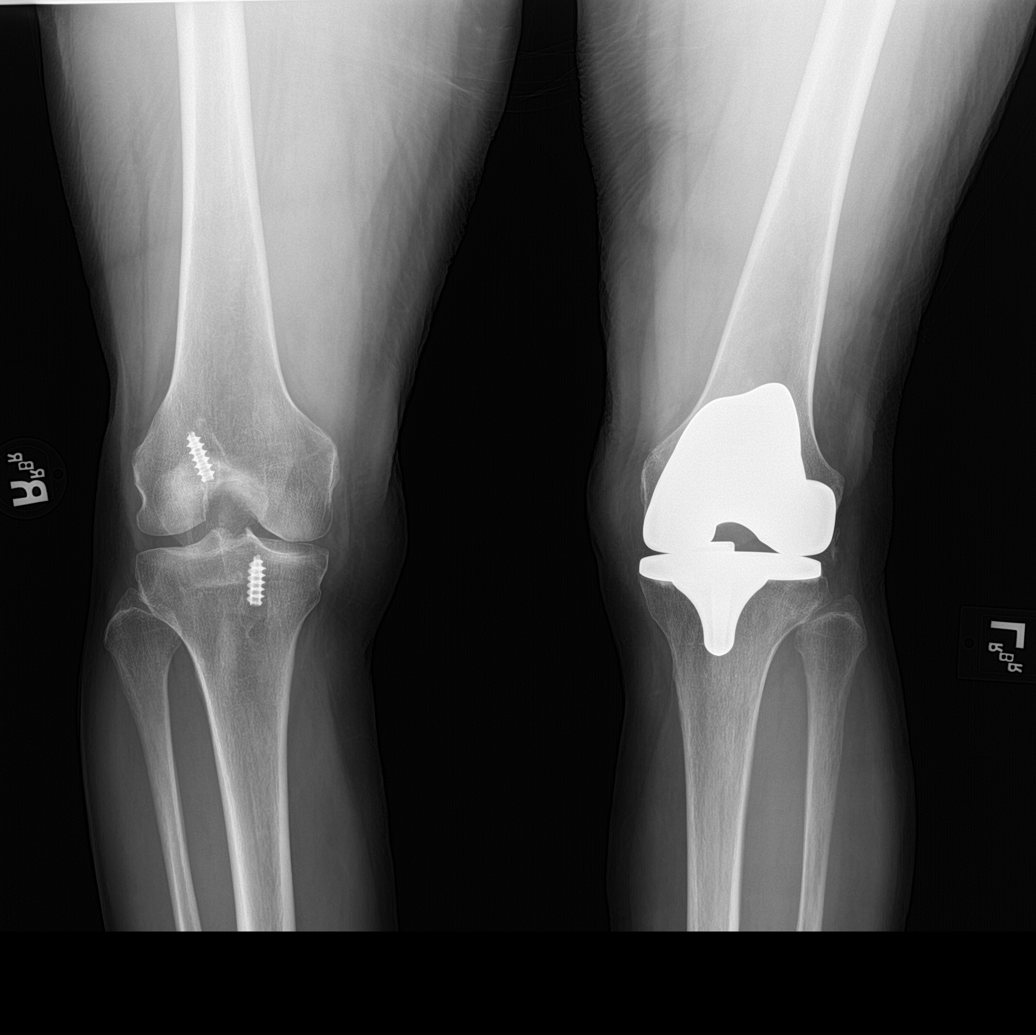

[knee lat]
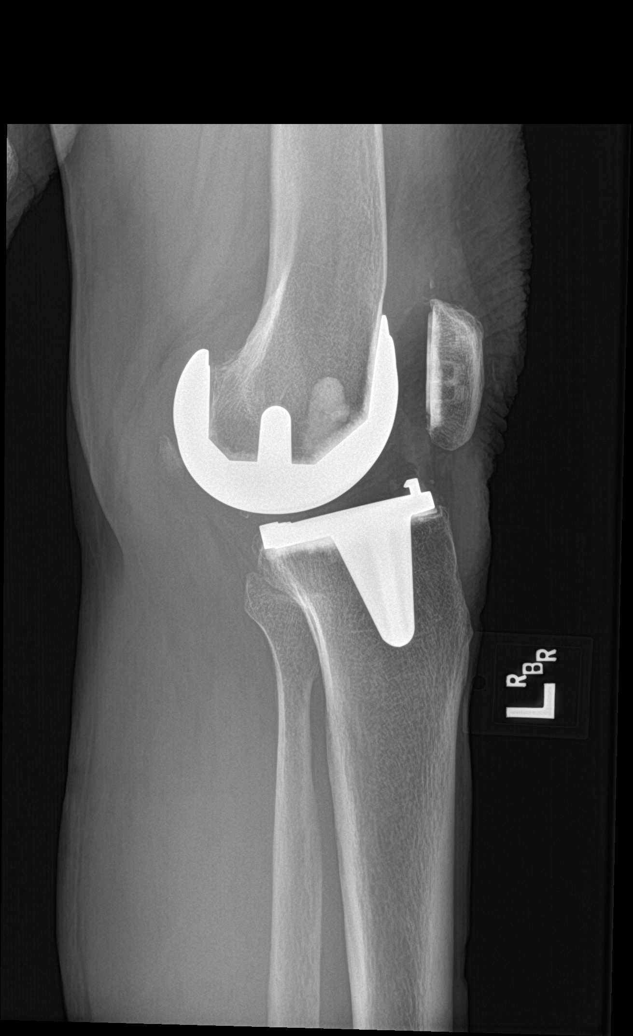

[knee sunrise]
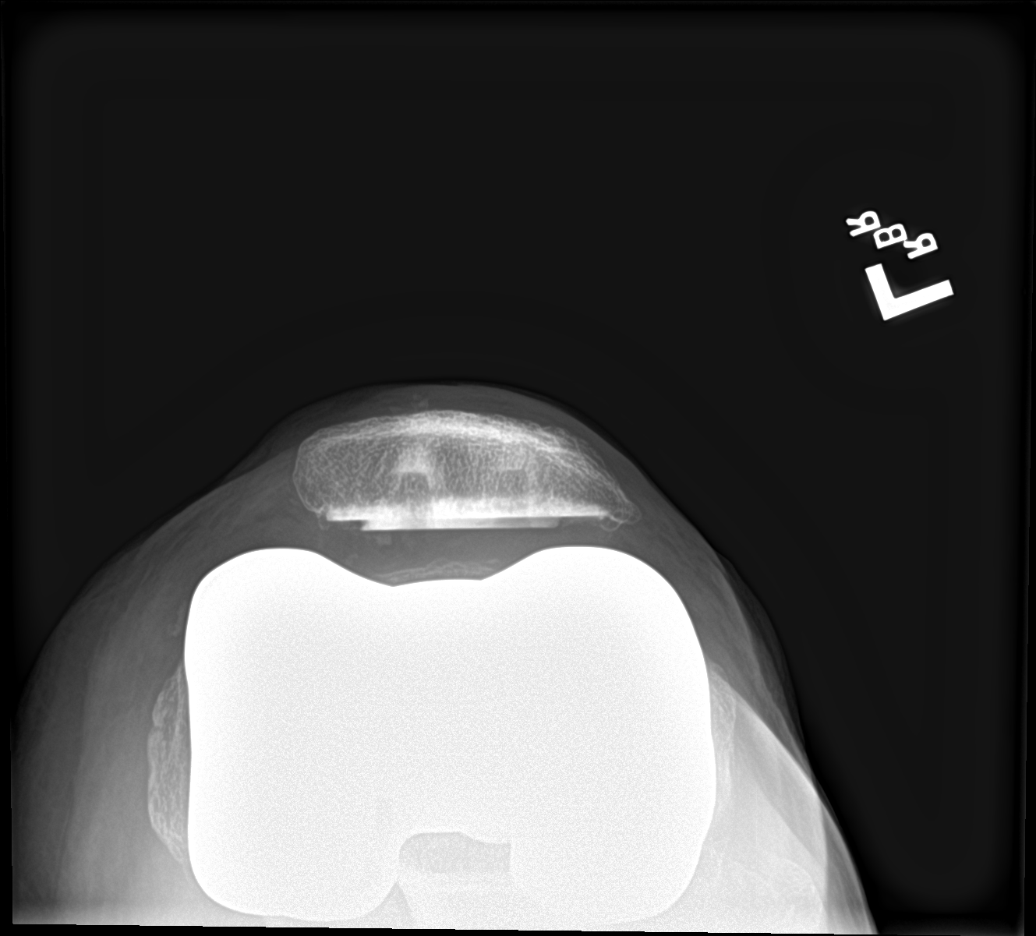

[knee ap bilat standing]
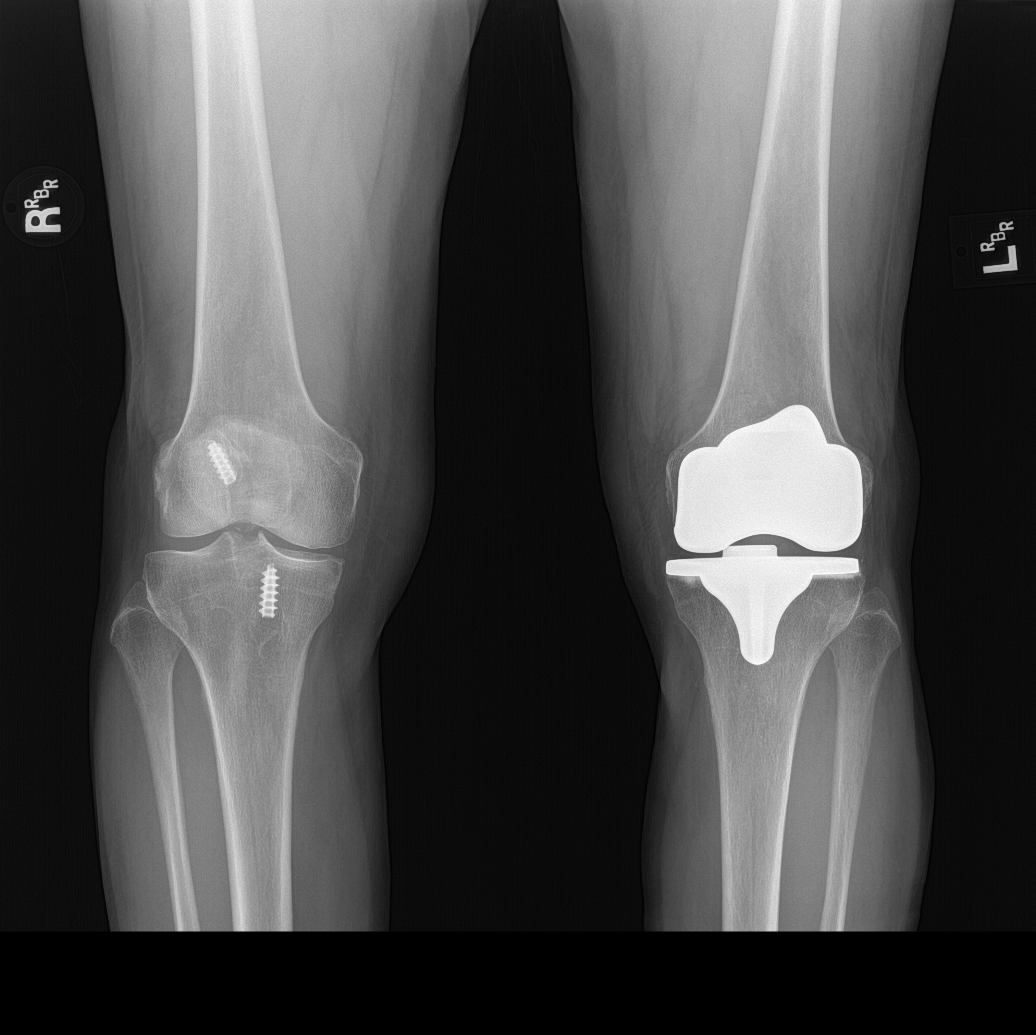

[4 of 4 positions shown; findings below may reference images not displayed]

FINDINGS: No acute bony or joint abnormality is seen on the right or left. The
patient is status post left total knee replacement. Hardware is
intact. Note is made that the tibia is anteriorly subluxed relative
to the femur suggestive of knee instability on the left. The patient
is status post ACL grafting on the right without evidence of
complication. No evidence of arthropathy.
IMPRESSION: Status post left total knee replacement with anterior subluxation of
the tibia compatible with anterior knee instability.

Status post ACL repair on the right.

## 2019-05-06 ENCOUNTER — Ambulatory Visit: Payer: Medicare HMO

## 2019-05-08 DIAGNOSIS — H10503 Unspecified blepharoconjunctivitis, bilateral: Secondary | ICD-10-CM | POA: Diagnosis not present

## 2019-05-08 DIAGNOSIS — H0100B Unspecified blepharitis left eye, upper and lower eyelids: Secondary | ICD-10-CM | POA: Diagnosis not present

## 2019-05-08 DIAGNOSIS — H33312 Horseshoe tear of retina without detachment, left eye: Secondary | ICD-10-CM | POA: Diagnosis not present

## 2019-05-08 DIAGNOSIS — H0100A Unspecified blepharitis right eye, upper and lower eyelids: Secondary | ICD-10-CM | POA: Diagnosis not present

## 2019-05-08 DIAGNOSIS — H2513 Age-related nuclear cataract, bilateral: Secondary | ICD-10-CM | POA: Diagnosis not present

## 2019-05-20 DIAGNOSIS — R7989 Other specified abnormal findings of blood chemistry: Secondary | ICD-10-CM | POA: Diagnosis not present

## 2019-05-21 LAB — BASIC METABOLIC PANEL WITH GFR
BUN / CREAT RATIO: 16 (calc) (ref 6–22)
BUN: 16 mg/dL (ref 7–25)
CALCIUM: 9.6 mg/dL (ref 8.6–10.4)
CO2: 28 mmol/L (ref 20–32)
CREATININE: 1.01 mg/dL — AB (ref 0.60–0.93)
Chloride: 103 mmol/L (ref 98–110)
GFR, EST AFRICAN AMERICAN: 64 mL/min/{1.73_m2} (ref >=60)
GFR, EST NON AFRICAN AMERICAN: 56 mL/min/{1.73_m2} — AB (ref >=60)
GLUCOSE: 95 mg/dL (ref 65–139)
Potassium: 4.5 mmol/L (ref 3.5–5.3)
Sodium: 140 mmol/L (ref 135–146)

## 2019-06-04 DIAGNOSIS — M4316 Spondylolisthesis, lumbar region: Secondary | ICD-10-CM | POA: Diagnosis not present

## 2019-06-04 DIAGNOSIS — M25551 Pain in right hip: Secondary | ICD-10-CM | POA: Diagnosis not present

## 2019-06-04 DIAGNOSIS — M545 Low back pain: Secondary | ICD-10-CM | POA: Diagnosis not present

## 2019-06-04 DIAGNOSIS — G894 Chronic pain syndrome: Secondary | ICD-10-CM | POA: Diagnosis not present

## 2019-06-05 DIAGNOSIS — H10503 Unspecified blepharoconjunctivitis, bilateral: Secondary | ICD-10-CM | POA: Diagnosis not present

## 2019-06-10 DIAGNOSIS — M79641 Pain in right hand: Secondary | ICD-10-CM | POA: Diagnosis not present

## 2019-06-10 DIAGNOSIS — M65312 Trigger thumb, left thumb: Secondary | ICD-10-CM | POA: Diagnosis not present

## 2019-06-10 DIAGNOSIS — M79642 Pain in left hand: Secondary | ICD-10-CM | POA: Diagnosis not present

## 2019-06-10 DIAGNOSIS — M79643 Pain in unspecified hand: Secondary | ICD-10-CM | POA: Diagnosis not present

## 2019-06-20 DIAGNOSIS — Z01812 Encounter for preprocedural laboratory examination: Secondary | ICD-10-CM | POA: Diagnosis not present

## 2019-06-20 DIAGNOSIS — Z1159 Encounter for screening for other viral diseases: Secondary | ICD-10-CM | POA: Diagnosis not present

## 2019-06-20 DIAGNOSIS — G894 Chronic pain syndrome: Secondary | ICD-10-CM | POA: Diagnosis not present

## 2019-06-20 DIAGNOSIS — M25551 Pain in right hip: Secondary | ICD-10-CM | POA: Diagnosis not present

## 2019-06-20 DIAGNOSIS — M25552 Pain in left hip: Secondary | ICD-10-CM | POA: Diagnosis not present

## 2019-06-24 DIAGNOSIS — M65322 Trigger finger, left index finger: Secondary | ICD-10-CM | POA: Diagnosis not present

## 2019-06-24 DIAGNOSIS — M65342 Trigger finger, left ring finger: Secondary | ICD-10-CM | POA: Diagnosis not present

## 2019-06-24 DIAGNOSIS — M65312 Trigger thumb, left thumb: Secondary | ICD-10-CM | POA: Diagnosis not present

## 2019-06-24 DIAGNOSIS — M1990 Unspecified osteoarthritis, unspecified site: Secondary | ICD-10-CM | POA: Diagnosis not present

## 2019-06-24 DIAGNOSIS — Z87891 Personal history of nicotine dependence: Secondary | ICD-10-CM | POA: Diagnosis not present

## 2019-06-24 DIAGNOSIS — Z96611 Presence of right artificial shoulder joint: Secondary | ICD-10-CM | POA: Diagnosis not present

## 2019-06-24 DIAGNOSIS — Z96652 Presence of left artificial knee joint: Secondary | ICD-10-CM | POA: Diagnosis not present

## 2019-06-24 DIAGNOSIS — J449 Chronic obstructive pulmonary disease, unspecified: Secondary | ICD-10-CM | POA: Diagnosis not present

## 2019-06-24 DIAGNOSIS — I10 Essential (primary) hypertension: Secondary | ICD-10-CM | POA: Diagnosis not present

## 2019-06-26 DIAGNOSIS — H2513 Age-related nuclear cataract, bilateral: Secondary | ICD-10-CM | POA: Diagnosis not present

## 2019-06-26 DIAGNOSIS — H524 Presbyopia: Secondary | ICD-10-CM | POA: Diagnosis not present

## 2019-06-30 ENCOUNTER — Other Ambulatory Visit: Payer: Self-pay | Admitting: Osteopathic Medicine

## 2019-07-02 DIAGNOSIS — M4326 Fusion of spine, lumbar region: Secondary | ICD-10-CM | POA: Diagnosis not present

## 2019-07-02 DIAGNOSIS — M4325 Fusion of spine, thoracolumbar region: Secondary | ICD-10-CM | POA: Diagnosis not present

## 2019-07-02 DIAGNOSIS — M961 Postlaminectomy syndrome, not elsewhere classified: Secondary | ICD-10-CM | POA: Diagnosis not present

## 2019-07-04 ENCOUNTER — Other Ambulatory Visit: Payer: Self-pay | Admitting: Osteopathic Medicine

## 2019-07-04 DIAGNOSIS — M4316 Spondylolisthesis, lumbar region: Secondary | ICD-10-CM | POA: Diagnosis not present

## 2019-07-04 DIAGNOSIS — M545 Low back pain: Secondary | ICD-10-CM | POA: Diagnosis not present

## 2019-07-04 DIAGNOSIS — M25552 Pain in left hip: Secondary | ICD-10-CM | POA: Diagnosis not present

## 2019-07-04 DIAGNOSIS — Z79899 Other long term (current) drug therapy: Secondary | ICD-10-CM | POA: Diagnosis not present

## 2019-07-04 DIAGNOSIS — M25551 Pain in right hip: Secondary | ICD-10-CM | POA: Diagnosis not present

## 2019-07-04 DIAGNOSIS — Z5181 Encounter for therapeutic drug level monitoring: Secondary | ICD-10-CM | POA: Diagnosis not present

## 2019-07-04 DIAGNOSIS — G894 Chronic pain syndrome: Secondary | ICD-10-CM | POA: Diagnosis not present

## 2019-07-11 DIAGNOSIS — Z01812 Encounter for preprocedural laboratory examination: Secondary | ICD-10-CM | POA: Diagnosis not present

## 2019-07-11 DIAGNOSIS — Z1159 Encounter for screening for other viral diseases: Secondary | ICD-10-CM | POA: Diagnosis not present

## 2019-07-11 DIAGNOSIS — M65321 Trigger finger, right index finger: Secondary | ICD-10-CM | POA: Diagnosis not present

## 2019-07-11 DIAGNOSIS — M65341 Trigger finger, right ring finger: Secondary | ICD-10-CM | POA: Diagnosis not present

## 2019-07-15 DIAGNOSIS — Z96611 Presence of right artificial shoulder joint: Secondary | ICD-10-CM | POA: Diagnosis not present

## 2019-07-15 DIAGNOSIS — M65341 Trigger finger, right ring finger: Secondary | ICD-10-CM | POA: Diagnosis not present

## 2019-07-15 DIAGNOSIS — Z96652 Presence of left artificial knee joint: Secondary | ICD-10-CM | POA: Diagnosis not present

## 2019-07-15 DIAGNOSIS — Z8542 Personal history of malignant neoplasm of other parts of uterus: Secondary | ICD-10-CM | POA: Diagnosis not present

## 2019-07-15 DIAGNOSIS — J449 Chronic obstructive pulmonary disease, unspecified: Secondary | ICD-10-CM | POA: Diagnosis not present

## 2019-07-15 DIAGNOSIS — M19011 Primary osteoarthritis, right shoulder: Secondary | ICD-10-CM | POA: Diagnosis not present

## 2019-07-15 DIAGNOSIS — Z881 Allergy status to other antibiotic agents status: Secondary | ICD-10-CM | POA: Diagnosis not present

## 2019-07-15 DIAGNOSIS — M65321 Trigger finger, right index finger: Secondary | ICD-10-CM | POA: Diagnosis not present

## 2019-07-15 DIAGNOSIS — I10 Essential (primary) hypertension: Secondary | ICD-10-CM | POA: Diagnosis not present

## 2019-07-15 DIAGNOSIS — M6530 Trigger finger, unspecified finger: Secondary | ICD-10-CM | POA: Insufficient documentation

## 2019-07-23 ENCOUNTER — Other Ambulatory Visit: Payer: Self-pay | Admitting: Osteopathic Medicine

## 2019-07-23 DIAGNOSIS — I10 Essential (primary) hypertension: Secondary | ICD-10-CM

## 2019-08-18 ENCOUNTER — Other Ambulatory Visit: Payer: Self-pay | Admitting: Osteopathic Medicine

## 2019-08-19 DIAGNOSIS — R911 Solitary pulmonary nodule: Secondary | ICD-10-CM | POA: Diagnosis not present

## 2019-08-19 DIAGNOSIS — R918 Other nonspecific abnormal finding of lung field: Secondary | ICD-10-CM | POA: Diagnosis not present

## 2019-08-19 DIAGNOSIS — J439 Emphysema, unspecified: Secondary | ICD-10-CM | POA: Diagnosis not present

## 2019-08-26 ENCOUNTER — Other Ambulatory Visit: Payer: Self-pay | Admitting: Osteopathic Medicine

## 2019-08-26 DIAGNOSIS — I10 Essential (primary) hypertension: Secondary | ICD-10-CM

## 2019-08-28 MED ORDER — AMLODIPINE BESYLATE 10 MG PO TABS: 10.0000 mg | ORAL_TABLET | Freq: Every day | ORAL | 0 refills | Status: DC

## 2019-09-12 DIAGNOSIS — G894 Chronic pain syndrome: Secondary | ICD-10-CM | POA: Diagnosis not present

## 2019-09-12 DIAGNOSIS — M545 Low back pain: Secondary | ICD-10-CM | POA: Diagnosis not present

## 2019-09-12 DIAGNOSIS — M25551 Pain in right hip: Secondary | ICD-10-CM | POA: Diagnosis not present

## 2019-09-12 DIAGNOSIS — M4316 Spondylolisthesis, lumbar region: Secondary | ICD-10-CM | POA: Diagnosis not present

## 2019-10-15 DIAGNOSIS — J449 Chronic obstructive pulmonary disease, unspecified: Secondary | ICD-10-CM | POA: Diagnosis not present

## 2019-10-15 DIAGNOSIS — J439 Emphysema, unspecified: Secondary | ICD-10-CM | POA: Diagnosis not present

## 2019-10-15 DIAGNOSIS — J479 Bronchiectasis, uncomplicated: Secondary | ICD-10-CM | POA: Diagnosis not present

## 2019-10-15 DIAGNOSIS — R0602 Shortness of breath: Secondary | ICD-10-CM | POA: Diagnosis not present

## 2019-10-15 DIAGNOSIS — R918 Other nonspecific abnormal finding of lung field: Secondary | ICD-10-CM | POA: Diagnosis not present

## 2019-11-07 DIAGNOSIS — M4316 Spondylolisthesis, lumbar region: Secondary | ICD-10-CM | POA: Diagnosis not present

## 2019-11-07 DIAGNOSIS — M25551 Pain in right hip: Secondary | ICD-10-CM | POA: Diagnosis not present

## 2019-11-07 DIAGNOSIS — M545 Low back pain: Secondary | ICD-10-CM | POA: Diagnosis not present

## 2019-11-07 DIAGNOSIS — G894 Chronic pain syndrome: Secondary | ICD-10-CM | POA: Diagnosis not present

## 2019-11-13 ENCOUNTER — Encounter: Payer: Self-pay | Admitting: Osteopathic Medicine

## 2019-11-13 ENCOUNTER — Other Ambulatory Visit: Payer: Self-pay | Admitting: Osteopathic Medicine

## 2019-11-13 DIAGNOSIS — Z1231 Encounter for screening mammogram for malignant neoplasm of breast: Secondary | ICD-10-CM

## 2019-11-20 ENCOUNTER — Ambulatory Visit: Payer: Medicare HMO

## 2019-11-26 DIAGNOSIS — J441 Chronic obstructive pulmonary disease with (acute) exacerbation: Secondary | ICD-10-CM | POA: Diagnosis not present

## 2019-11-26 DIAGNOSIS — J4541 Moderate persistent asthma with (acute) exacerbation: Secondary | ICD-10-CM | POA: Diagnosis not present

## 2019-11-26 DIAGNOSIS — J988 Other specified respiratory disorders: Secondary | ICD-10-CM | POA: Diagnosis not present

## 2019-12-01 ENCOUNTER — Other Ambulatory Visit: Payer: Self-pay | Admitting: Osteopathic Medicine

## 2019-12-01 DIAGNOSIS — I10 Essential (primary) hypertension: Secondary | ICD-10-CM

## 2019-12-09 ENCOUNTER — Other Ambulatory Visit: Payer: Self-pay

## 2019-12-09 ENCOUNTER — Ambulatory Visit (INDEPENDENT_AMBULATORY_CARE_PROVIDER_SITE_OTHER): Payer: Medicare HMO

## 2019-12-09 ENCOUNTER — Ambulatory Visit (INDEPENDENT_AMBULATORY_CARE_PROVIDER_SITE_OTHER): Payer: Medicare HMO | Admitting: Sports Medicine

## 2019-12-09 DIAGNOSIS — M1611 Unilateral primary osteoarthritis, right hip: Secondary | ICD-10-CM

## 2019-12-09 NOTE — Progress Notes (Signed)
    Procedures performed today:    Procedure: Real-time Ultrasound Guided injection of the right hip joint Device: Samsung HS60  Verbal informed consent obtained.  Time-out conducted.  Noted no overlying erythema, induration, or other signs of local infection.  Skin prepped in a sterile fashion.  Local anesthesia: Topical Ethyl chloride.  With sterile technique and under real time ultrasound guidance: 1 cc Kenalog 40, 2 cc lidocaine, 2 cc bupivacaine injected easily Completed without difficulty  Pain immediately resolved suggesting accurate placement of the medication.  Advised to call if fevers/chills, erythema, induration, drainage, or persistent bleeding.  Images permanently stored and available for review in the ultrasound unit.  Impression: Technically successful ultrasound guided injection.  Independent interpretation of tests performed by another provider:   I personally reviewed her hip x-rays and MRI from 2016, she has expected degenerative labral fraying and hip osteoarthritis.  I also reviewed her hip x-rays from today, she does have progression of right hip osteoarthritis with subchondral sclerosis, osteophytosis and joint space loss.  Impression and Recommendations:    I have performed independent interpretation of the relevant labs and imaging ordered by this patient's other providers.  Primary osteoarthritis of right hip New onset pain in the right groin. Patient has had gluteus medius tendon sheath as well as trochanteric bursa injections. Today her pain is seemingly referrable to the hip joint as she has pain in the groin, worse with ambulation, no mechanical symptoms. Oral medications have not been effective so we injected her hip joint today. I would like some repeat x-rays. Return to see me in a month. Adding a handicapped placard.    ___________________________________________ Gwen Her. Dianah Field, M.D., ABFM., CAQSM. Primary Care and [place] Instructor of [place] of [name] [employer]

## 2019-12-09 NOTE — Assessment & Plan Note (Addendum)
New onset pain in the right groin. Patient has had gluteus medius tendon sheath as well as trochanteric bursa injections. Today her pain is seemingly referrable to the hip joint as she has pain in the groin, worse with ambulation, no mechanical symptoms. Oral medications have not been effective so we injected her hip joint today. I would like some repeat x-rays. Return to see me in a month. Adding a handicapped placard.

## 2019-12-11 ENCOUNTER — Ambulatory Visit (INDEPENDENT_AMBULATORY_CARE_PROVIDER_SITE_OTHER): Payer: Medicare HMO

## 2019-12-11 ENCOUNTER — Other Ambulatory Visit: Payer: Self-pay

## 2019-12-11 DIAGNOSIS — Z1231 Encounter for screening mammogram for malignant neoplasm of breast: Secondary | ICD-10-CM | POA: Diagnosis not present

## 2019-12-26 ENCOUNTER — Other Ambulatory Visit: Payer: Self-pay | Admitting: Osteopathic Medicine

## 2019-12-26 DIAGNOSIS — J479 Bronchiectasis, uncomplicated: Secondary | ICD-10-CM | POA: Diagnosis not present

## 2019-12-26 DIAGNOSIS — R918 Other nonspecific abnormal finding of lung field: Secondary | ICD-10-CM | POA: Diagnosis not present

## 2019-12-26 DIAGNOSIS — J449 Chronic obstructive pulmonary disease, unspecified: Secondary | ICD-10-CM | POA: Diagnosis not present

## 2019-12-26 DIAGNOSIS — J988 Other specified respiratory disorders: Secondary | ICD-10-CM | POA: Diagnosis not present

## 2019-12-26 DIAGNOSIS — I10 Essential (primary) hypertension: Secondary | ICD-10-CM

## 2019-12-30 ENCOUNTER — Ambulatory Visit (INDEPENDENT_AMBULATORY_CARE_PROVIDER_SITE_OTHER): Payer: Medicare HMO | Admitting: Osteopathic Medicine

## 2019-12-30 ENCOUNTER — Other Ambulatory Visit: Payer: Self-pay

## 2019-12-30 ENCOUNTER — Encounter: Payer: Self-pay | Admitting: Osteopathic Medicine

## 2019-12-30 ENCOUNTER — Other Ambulatory Visit: Payer: Self-pay | Admitting: Osteopathic Medicine

## 2019-12-30 VITALS — BP 149/81 | HR 84 | Temp 97.7°F | Ht 61.0 in | Wt 134.0 lb

## 2019-12-30 DIAGNOSIS — R3915 Urgency of urination: Secondary | ICD-10-CM

## 2019-12-30 DIAGNOSIS — L659 Nonscarring hair loss, unspecified: Secondary | ICD-10-CM | POA: Diagnosis not present

## 2019-12-30 DIAGNOSIS — G8929 Other chronic pain: Secondary | ICD-10-CM | POA: Diagnosis not present

## 2019-12-30 DIAGNOSIS — E039 Hypothyroidism, unspecified: Secondary | ICD-10-CM | POA: Diagnosis not present

## 2019-12-30 DIAGNOSIS — M545 Low back pain, unspecified: Secondary | ICD-10-CM

## 2019-12-30 DIAGNOSIS — R350 Frequency of micturition: Secondary | ICD-10-CM | POA: Diagnosis not present

## 2019-12-30 LAB — POCT URINALYSIS DIP (CLINITEK)
Bilirubin, UA: NEGATIVE
Blood, UA: NEGATIVE
Glucose, UA: NEGATIVE mg/dL
Ketones, POC UA: NEGATIVE mg/dL
Leukocytes, UA: NEGATIVE
Nitrite, UA: NEGATIVE
Spec Grav, UA: 1.02 (ref 1.010–1.025)
Urobilinogen, UA: 0.2 E.U./dL
pH, UA: >=9 — AB (ref 5.0–8.0)

## 2019-12-30 MED ORDER — OMEPRAZOLE 40 MG PO CPDR: 40.0000 mg | DELAYED_RELEASE_CAPSULE | Freq: Every day | ORAL | 3 refills | Status: DC

## 2019-12-30 NOTE — Progress Notes (Signed)
Margaret [name] is a 74 y.o. female who presents to  [place] [facility]  today, 12/30/19, seeking care for the following:  The primary encounter diagnosis was Urinary frequency. A diagnosis of Urinary urgency was also pertinent to this visit.     Results for orders placed or performed in visit on 12/30/19 (from the past 24 hour(s))  POCT URINALYSIS DIP (CLINITEK)     Status: Abnormal   Collection Time: 12/30/19 10:27 AM  Result Value Ref Range   Color, UA yellow yellow   Clarity, UA clear clear   Glucose, UA negative negative mg/dL   Bilirubin, UA negative negative   Ketones, POC UA negative negative mg/dL   Spec Grav, UA 1.020 1.010 - 1.025   Blood, UA negative negative   pH, UA >=9.0 (A) 5.0 - 8.0   POC PROTEIN,UA trace negative, trace   Urobilinogen, UA 0.2 0.2 or 1.0 E.U./dL   Nitrite, UA Negative Negative   Leukocytes, UA Negative Negative     Chronic, stable: [name] Hair loss conitnues to bother her, concerned about thyroid levels / may want to change meds        ASSESSMENT & PLAN with other pertinent history/findings:   1. Urinary frequency 2. Urinary urgency 3. Chronic bilateral low back pain without sciatica . Urinary frequency on and off for past few weeks associated w/ low back pain which is chronic. NO dysuria . Neg [place], (+)SLR on exam bilaterally  . See pt instructions below   Results for orders placed or performed in visit on 12/30/19 (from the past 24 hour(s))  POCT URINALYSIS DIP (CLINITEK)     Status: Abnormal   Collection Time: 12/30/19 10:27 AM  Result Value Ref Range   Color, UA yellow yellow   Clarity, UA clear clear   Glucose, UA negative negative mg/dL   Bilirubin, UA negative negative   Ketones, POC UA negative negative mg/dL   Spec Grav, UA 1.020 1.010 - 1.025   Blood, UA negative negative   pH, UA >=9.0 (A) 5.0 - 8.0   POC PROTEIN,UA trace negative, trace   Urobilinogen, UA 0.2 0.2 or  1.0 E.U./dL   Nitrite, UA Negative Negative   Leukocytes, UA Negative Negative      4. Hair loss 5. Hypothyroidism, unspecified type See pt instructions below     Patient Instructions  Low back -  See exercises printed  Urinary issues -  No evidence of UTI, await urine culture to be sure Option for medications to treat bladder spasm Timed voiding (trying to pee every 1-2 hours while awake)  Hair loss -  Will check thyroid Consider switching medications  Hair loss treatments - Topical: minoxidil (Rogaine)  Pills: spironolactone, finasteride      Follow-up instructions: Return if symptoms worsen or fail to improve.         BP (!) 149/81   Pulse 84   Temp 97.7 F (36.5 C) (Oral)   Ht 5\' 1"  (1.549 m)   Wt 134 lb (60.8 kg)   BMI 25.32 kg/m   Current Meds  Medication Sig  . albuterol (PROVENTIL HFA;VENTOLIN HFA) 108 (90 Base) MCG/ACT inhaler Inhale 1-2 puffs into the lungs every 4 (four) hours as needed for wheezing.  [name] amLODipine (NORVASC) 10 MG tablet TAKE 1 TABLET BY MOUTH EVERY DAY  . FLUoxetine (PROZAC) 40 MG capsule Take 1 capsule (40 mg total) by mouth daily.  [name] HYDROcodone-acetaminophen (NORCO) 10-325 MG tablet Take 1  tablet by mouth every 6 (six) hours as needed.   [name] levothyroxine (SYNTHROID) 50 MCG tablet TAKE 1 TABLET BY MOUTH EVERY DAY  . lidocaine (XYLOCAINE) 2 % jelly Apply 1 application topically as needed.  . montelukast (SINGULAIR) 10 MG tablet Take 10 mg by mouth daily.  [name] omeprazole (PRILOSEC) 40 MG capsule Take 1 capsule (40 mg total) by mouth daily.  . [DISCONTINUED] omeprazole (PRILOSEC) 40 MG capsule TAKE 1 CAPSULE BY MOUTH TWICE A DAY BEFORE MEALS     No results found.  All questions at time of visit were answered - patient instructed to contact office with any additional concerns or updates.  ER/RTC precautions were reviewed with the patient.  Please note: voice recognition software was used to produce this document, and typos  may escape review. Please contact Dr. Sheppard Coil for any needed clarifications.

## 2019-12-30 NOTE — Patient Instructions (Signed)
Low back -  See exercises printed  Urinary issues -  No evidence of UTI, await urine culture to be sure Option for medications to treat bladder spasm Timed voiding (trying to pee every 1-2 hours while awake)  Hair loss -  Will check thyroid Consider switching medications  Hair loss treatments - Topical: minoxidil (Rogaine)  Pills: spironolactone, finasteride

## 2019-12-31 LAB — URINE CULTURE
MICRO NUMBER:: 10078260
SPECIMEN QUALITY:: ADEQUATE

## 2019-12-31 LAB — URINALYSIS, MICROSCOPIC ONLY
Bacteria, UA: NONE SEEN /HPF
Hyaline Cast: NONE SEEN /LPF
RBC / HPF: NONE SEEN /HPF (ref 0–2)
Squamous Epithelial / HPF: NONE SEEN /HPF (ref <=5)
WBC, UA: NONE SEEN /HPF (ref 0–5)

## 2020-01-01 ENCOUNTER — Other Ambulatory Visit: Payer: Self-pay | Admitting: Osteopathic Medicine

## 2020-01-02 DIAGNOSIS — M545 Low back pain: Secondary | ICD-10-CM | POA: Diagnosis not present

## 2020-01-02 DIAGNOSIS — G894 Chronic pain syndrome: Secondary | ICD-10-CM | POA: Diagnosis not present

## 2020-01-02 DIAGNOSIS — M4316 Spondylolisthesis, lumbar region: Secondary | ICD-10-CM | POA: Diagnosis not present

## 2020-01-02 DIAGNOSIS — M25551 Pain in right hip: Secondary | ICD-10-CM | POA: Diagnosis not present

## 2020-01-07 ENCOUNTER — Other Ambulatory Visit: Payer: Self-pay

## 2020-01-07 ENCOUNTER — Ambulatory Visit (INDEPENDENT_AMBULATORY_CARE_PROVIDER_SITE_OTHER): Payer: Medicare HMO | Admitting: Sports Medicine

## 2020-01-07 ENCOUNTER — Encounter: Payer: Self-pay | Admitting: Sports Medicine

## 2020-01-07 ENCOUNTER — Ambulatory Visit (INDEPENDENT_AMBULATORY_CARE_PROVIDER_SITE_OTHER): Payer: Medicare HMO

## 2020-01-07 DIAGNOSIS — M1611 Unilateral primary osteoarthritis, right hip: Secondary | ICD-10-CM | POA: Diagnosis not present

## 2020-01-07 DIAGNOSIS — M4807 Spinal stenosis, lumbosacral region: Secondary | ICD-10-CM

## 2020-01-07 DIAGNOSIS — M48061 Spinal stenosis, lumbar region without neurogenic claudication: Secondary | ICD-10-CM

## 2020-01-07 DIAGNOSIS — M4326 Fusion of spine, lumbar region: Secondary | ICD-10-CM | POA: Diagnosis not present

## 2020-01-07 NOTE — Assessment & Plan Note (Addendum)
Margaret [name] has a history of an L4-S1 posterior lumbar interbody fusion. This was done by Dr. Sherlyn Lick with [facility]. Her anterior groin pain has resolved with a hip joint injection but she continues to have pain in her right buttock. This pain is better with leaning forward, worse with standing. I do think it is referred from her lumbar spine, and she has no tenderness over the trochanteric bursa. We are going to get an x-ray (flexion/extension views to ensure stability of the construct) and an MRI of her lumbar spine for interventional planning, she has failed greater than 6 weeks of conservative measures. Return to see me for MRI results.  Fusion is actually [id number], no complicating features, we could certainly try a sacroiliac joint injection, have her come back to see me for this.

## 2020-01-07 NOTE — Assessment & Plan Note (Signed)
Margaret [name] returns, we injected her right hip joint at the last visit and her anterior/groin pain has resolved completely.

## 2020-01-07 NOTE — Progress Notes (Addendum)
    Procedures performed today:    None.  Independent interpretation of tests performed by another provider:   I personally reviewed her lumbar spine x-rays, she does have an L4-S1 posterior lumbar fusion, she has significant adjacent level disease at the 2 levels above the construct with several levels of retrolisthesis.  Impression and Recommendations:    Primary osteoarthritis of right hip Karleigh returns, we injected her right hip joint at the last visit and her anterior/groin pain has resolved completely.   Right-sided low back pain post T10-S1 fusion Antania has a history of an L4-S1 posterior lumbar interbody fusion. This was done by Dr. Sherlyn Lick with [facility]. Her anterior groin pain has resolved with a hip joint injection but she continues to have pain in her right buttock. This pain is better with leaning forward, worse with standing. I do think it is referred from her lumbar spine, and she has no tenderness over the trochanteric bursa. We are going to get an x-ray (flexion/extension views to ensure stability of the construct) and an MRI of her lumbar spine for interventional planning, she has failed greater than 6 weeks of conservative measures. Return to see me for MRI results.  Fusion is actually [id number], no complicating features, we could certainly try a sacroiliac joint injection, have her come back to see me for this.   ___________________________________________ Gwen Her. Dianah Field, M.D., ABFM., CAQSM. Primary Care and [place] Instructor of [place] of [facility] [employer]

## 2020-01-11 ENCOUNTER — Other Ambulatory Visit: Payer: Self-pay

## 2020-01-11 ENCOUNTER — Ambulatory Visit (INDEPENDENT_AMBULATORY_CARE_PROVIDER_SITE_OTHER): Payer: Medicare HMO

## 2020-01-11 DIAGNOSIS — M48061 Spinal stenosis, lumbar region without neurogenic claudication: Secondary | ICD-10-CM | POA: Diagnosis not present

## 2020-01-11 DIAGNOSIS — M4807 Spinal stenosis, lumbosacral region: Secondary | ICD-10-CM | POA: Diagnosis not present

## 2020-01-15 DIAGNOSIS — Z20828 Contact with and (suspected) exposure to other viral communicable diseases: Secondary | ICD-10-CM | POA: Diagnosis not present

## 2020-01-15 DIAGNOSIS — Z03818 Encounter for observation for suspected exposure to other biological agents ruled out: Secondary | ICD-10-CM | POA: Diagnosis not present

## 2020-01-17 ENCOUNTER — Other Ambulatory Visit: Payer: Self-pay

## 2020-01-17 ENCOUNTER — Ambulatory Visit (INDEPENDENT_AMBULATORY_CARE_PROVIDER_SITE_OTHER): Payer: Medicare HMO | Admitting: Sports Medicine

## 2020-01-17 DIAGNOSIS — E039 Hypothyroidism, unspecified: Secondary | ICD-10-CM | POA: Diagnosis not present

## 2020-01-17 DIAGNOSIS — M48061 Spinal stenosis, lumbar region without neurogenic claudication: Secondary | ICD-10-CM

## 2020-01-17 DIAGNOSIS — L659 Nonscarring hair loss, unspecified: Secondary | ICD-10-CM | POA: Diagnosis not present

## 2020-01-17 NOTE — Assessment & Plan Note (Signed)
Margaret [name] has a T10-S1 fusion. She has multifactorial right hip and groin pain, her anterior groin pain got a lot better with a right hip joint injection about a month ago, but she continues have pain in her right buttock. Today we injected her SI joint on the right for diagnostic and therapeutic purposes. I would like to see her back in a month to see how things are going. If she continues to have pain then we would likely refer for hip arthroplasty.

## 2020-01-17 NOTE — Progress Notes (Signed)
    Procedures performed today:    Procedure: Real-time Ultrasound Guided injection of the right sacroiliac joint Device: Samsung HS60  Verbal informed consent obtained.  Time-out conducted.  Noted no overlying erythema, induration, or other signs of local infection.  Skin prepped in a sterile fashion.  Local anesthesia: Topical Ethyl chloride.  With sterile technique and under real time ultrasound guidance: 1 cc Kenalog 40, 2 cc lidocaine, 2 cc bupivacaine injected easily Completed without difficulty  Pain immediately resolved suggesting accurate placement of the medication.  Advised to call if fevers/chills, erythema, induration, drainage, or persistent bleeding.  Images permanently stored and available for review in the ultrasound unit.  Impression: Technically successful ultrasound guided injection.  Independent interpretation of tests performed by another provider:   None.  Impression and Recommendations:    Right-sided low back pain post T10-S1 fusion Margaret [name] has a T10-S1 fusion. She has multifactorial right hip and groin pain, her anterior groin pain got a lot better with a right hip joint injection about a month ago, but she continues have pain in her right buttock. Today we injected her SI joint on the right for diagnostic and therapeutic purposes. I would like to see her back in a month to see how things are going. If she continues to have pain then we would likely refer for hip arthroplasty.    ___________________________________________ Margaret [name], M.D., ABFM., CAQSM. Primary Care and [place] Instructor of [place] of [facility] of Medicine

## 2020-01-18 LAB — TSH: TSH: 3.67 mIU/L (ref 0.40–4.50)

## 2020-02-03 ENCOUNTER — Other Ambulatory Visit: Payer: Self-pay | Admitting: Osteopathic Medicine

## 2020-02-14 ENCOUNTER — Ambulatory Visit: Payer: Medicare HMO | Admitting: Sports Medicine

## 2020-03-09 ENCOUNTER — Other Ambulatory Visit: Payer: Self-pay

## 2020-03-09 ENCOUNTER — Ambulatory Visit (INDEPENDENT_AMBULATORY_CARE_PROVIDER_SITE_OTHER): Payer: Medicare HMO | Admitting: Sports Medicine

## 2020-03-09 DIAGNOSIS — M1611 Unilateral primary osteoarthritis, right hip: Secondary | ICD-10-CM

## 2020-03-09 DIAGNOSIS — M48061 Spinal stenosis, lumbar region without neurogenic claudication: Secondary | ICD-10-CM | POA: Diagnosis not present

## 2020-03-09 NOTE — Assessment & Plan Note (Signed)
Margaret [name] returns, I injected her right hip joint in January 2021, she did extremely well, not surprisingly having a recurrence of pain. At the last visit she had some pain in her back around the SI joint, this was injected and she is pain-free with regards to her spine. Her pain today is mostly in the groin, I did advise her at this point she really needs a hip arthroplasty, referral to Dr. Creig Hines in [place] per her request.

## 2020-03-09 NOTE — Progress Notes (Signed)
    Procedures performed today:    None.  Independent interpretation of notes and tests performed by another provider:   X-rays personally reviewed and show severe right hip osteoarthritis.  Brief History, Exam, Impression, and Recommendations:    Primary osteoarthritis of right hip Margaret [name] returns, I injected [name] right hip joint in January 2021, she did extremely well, not surprisingly having a recurrence of pain. At the last visit she had some pain in [name] back around the SI joint, this was injected and she is pain-free with regards to [name] spine. [name] pain today is mostly in the groin, I did advise [name] at this point she really needs a hip arthroplasty, referral to Dr. Creig Hines in [place] per [name] request.  Right-sided low back pain post T10-S1 fusion Margaret [name] returns, she is a pleasant 74 year old female with a T10-S1 fusion. She had multifactorial right hip, groin, buttock pain. At the last visit [name] pain was referrable to the right sacroiliac joint, this was injected for diagnostic and therapeutic purposes and she returns pain-free with regards to [name] back. Of note an MRI showed uncomplicated [id number] fusion, very well decompressed by Dr. Sherlyn Lick.    ___________________________________________ Margaret [name]. Margaret [name], M.D., ABFM., CAQSM. Primary Care and [place] Instructor of [place] of [facility] of Medicine

## 2020-03-09 NOTE — Assessment & Plan Note (Signed)
Margaret [name] returns, she is a pleasant 74 year old female with a T10-S1 fusion. She had multifactorial right hip, groin, buttock pain. At the last visit her pain was referrable to the right sacroiliac joint, this was injected for diagnostic and therapeutic purposes and she returns pain-free with regards to her back. Of note an MRI showed uncomplicated [id number] fusion, very well decompressed by Dr. Sherlyn Lick.

## 2020-03-18 DIAGNOSIS — M1611 Unilateral primary osteoarthritis, right hip: Secondary | ICD-10-CM | POA: Diagnosis not present

## 2020-03-18 DIAGNOSIS — M25551 Pain in right hip: Secondary | ICD-10-CM | POA: Diagnosis not present

## 2020-03-19 ENCOUNTER — Other Ambulatory Visit: Payer: Self-pay | Admitting: Osteopathic Medicine

## 2020-03-19 DIAGNOSIS — I10 Essential (primary) hypertension: Secondary | ICD-10-CM

## 2020-03-25 DIAGNOSIS — J479 Bronchiectasis, uncomplicated: Secondary | ICD-10-CM | POA: Diagnosis not present

## 2020-03-27 DIAGNOSIS — R0902 Hypoxemia: Secondary | ICD-10-CM | POA: Diagnosis not present

## 2020-03-27 DIAGNOSIS — D72829 Elevated white blood cell count, unspecified: Secondary | ICD-10-CM | POA: Diagnosis not present

## 2020-03-27 DIAGNOSIS — J441 Chronic obstructive pulmonary disease with (acute) exacerbation: Secondary | ICD-10-CM | POA: Diagnosis not present

## 2020-03-27 DIAGNOSIS — R0600 Dyspnea, unspecified: Secondary | ICD-10-CM | POA: Diagnosis not present

## 2020-03-27 DIAGNOSIS — I10 Essential (primary) hypertension: Secondary | ICD-10-CM | POA: Diagnosis not present

## 2020-03-27 DIAGNOSIS — E039 Hypothyroidism, unspecified: Secondary | ICD-10-CM | POA: Diagnosis not present

## 2020-03-27 DIAGNOSIS — R062 Wheezing: Secondary | ICD-10-CM | POA: Diagnosis not present

## 2020-03-27 DIAGNOSIS — R0689 Other abnormalities of breathing: Secondary | ICD-10-CM | POA: Diagnosis not present

## 2020-03-27 DIAGNOSIS — R05 Cough: Secondary | ICD-10-CM | POA: Diagnosis not present

## 2020-03-27 DIAGNOSIS — R918 Other nonspecific abnormal finding of lung field: Secondary | ICD-10-CM | POA: Diagnosis not present

## 2020-03-27 DIAGNOSIS — J159 Unspecified bacterial pneumonia: Secondary | ICD-10-CM | POA: Diagnosis not present

## 2020-03-27 DIAGNOSIS — R0602 Shortness of breath: Secondary | ICD-10-CM | POA: Diagnosis not present

## 2020-03-27 DIAGNOSIS — Z743 Need for continuous supervision: Secondary | ICD-10-CM | POA: Diagnosis not present

## 2020-03-27 DIAGNOSIS — R000 Tachycardia, unspecified: Secondary | ICD-10-CM | POA: Diagnosis not present

## 2020-03-27 DIAGNOSIS — J9600 Acute respiratory failure, unspecified whether with hypoxia or hypercapnia: Secondary | ICD-10-CM | POA: Diagnosis not present

## 2020-03-30 DIAGNOSIS — G894 Chronic pain syndrome: Secondary | ICD-10-CM | POA: Diagnosis not present

## 2020-03-30 DIAGNOSIS — M545 Low back pain: Secondary | ICD-10-CM | POA: Diagnosis not present

## 2020-03-30 DIAGNOSIS — M25552 Pain in left hip: Secondary | ICD-10-CM | POA: Diagnosis not present

## 2020-03-30 DIAGNOSIS — M4316 Spondylolisthesis, lumbar region: Secondary | ICD-10-CM | POA: Diagnosis not present

## 2020-03-30 DIAGNOSIS — Z79899 Other long term (current) drug therapy: Secondary | ICD-10-CM | POA: Diagnosis not present

## 2020-03-30 DIAGNOSIS — Z5181 Encounter for therapeutic drug level monitoring: Secondary | ICD-10-CM | POA: Diagnosis not present

## 2020-03-30 DIAGNOSIS — M25551 Pain in right hip: Secondary | ICD-10-CM | POA: Diagnosis not present

## 2020-03-31 ENCOUNTER — Other Ambulatory Visit: Payer: Self-pay

## 2020-03-31 NOTE — Patient Outreach (Signed)
[facility]) Care Management  03/31/2020  Margaret [name] Sep 13, 1946 [id number]     Transition of Care Referral  Referral Date: 03/31/2020 Referral Source: [facility] Discharge Report Date of Discharge: 03/29/2020 Facility: [facility] [place]: [facility]    Outreach attempt # 1 to patient. No answer at present. RN CM left HIPAA compliant voicemail message along with contact info.    Plan: RN CM will make outreach attempt to patient within 3-4 business days. RN CM will send unsuccessful outreach letter to patient.   Enzo Montgomery, RN,BSN,CCM [place] Management Telephonic Care Management Coordinator Direct Phone: [phone] Toll Free: [phone] Fax: [phone]

## 2020-04-01 ENCOUNTER — Other Ambulatory Visit: Payer: Self-pay

## 2020-04-01 DIAGNOSIS — M25551 Pain in right hip: Secondary | ICD-10-CM | POA: Diagnosis not present

## 2020-04-01 DIAGNOSIS — M25552 Pain in left hip: Secondary | ICD-10-CM | POA: Diagnosis not present

## 2020-04-01 NOTE — Patient Outreach (Signed)
[facility]) Care Management  04/01/2020  Margaret [name] 05-Sep-1946 [id number]   Transition of Care Referral  Referral Date: 03/31/2020 Referral Source: [facility] Discharge Report Date of Discharge: 03/29/2020 Facility: [facility] [place]: [facility]    Outreach attempt #2 to patient. No answer after several rings.      Plan: RN CM will make outreach attempt to patient within 3-4 business days.  Enzo Montgomery, RN,BSN,CCM [place] Management Telephonic Care Management Coordinator Direct Phone: [phone] Toll Free: [phone] Fax: [phone]

## 2020-04-03 ENCOUNTER — Other Ambulatory Visit: Payer: Self-pay

## 2020-04-03 DIAGNOSIS — J479 Bronchiectasis, uncomplicated: Secondary | ICD-10-CM | POA: Diagnosis not present

## 2020-04-03 DIAGNOSIS — J189 Pneumonia, unspecified organism: Secondary | ICD-10-CM | POA: Diagnosis not present

## 2020-04-03 DIAGNOSIS — J4541 Moderate persistent asthma with (acute) exacerbation: Secondary | ICD-10-CM | POA: Diagnosis not present

## 2020-04-03 DIAGNOSIS — J849 Interstitial pulmonary disease, unspecified: Secondary | ICD-10-CM | POA: Diagnosis not present

## 2020-04-03 DIAGNOSIS — R079 Chest pain, unspecified: Secondary | ICD-10-CM | POA: Diagnosis not present

## 2020-04-03 NOTE — Patient Outreach (Signed)
[name] [facility]) Care Management  04/03/2020  Margaret [name] 1946-09-17 [id number]    Transition of Care Referral  Referral Date:03/31/2020 Referral Source:[employer] Discharge Report Date of Discharge:03/29/2020 Facility:[facility]. [place] Medicare    Outreach attempt #3 to patient. No answer at present.    Plan: RN CM will close case if no response from letter mailed to patient.    Enzo Montgomery, RN,BSN,CCM [place] Management Telephonic Care Management Coordinator Direct Phone: [phone] Toll Free: [phone] Fax: [phone]

## 2020-04-07 ENCOUNTER — Other Ambulatory Visit: Payer: Self-pay | Admitting: Osteopathic Medicine

## 2020-04-07 DIAGNOSIS — F418 Other specified anxiety disorders: Secondary | ICD-10-CM

## 2020-04-10 ENCOUNTER — Other Ambulatory Visit: Payer: Self-pay

## 2020-04-10 NOTE — Patient Outreach (Signed)
Margaret [name] [facility]) Care Management  04/10/2020  Margaret [name] [birth year] [id number]   Transition of Care Referral  Referral Date:03/31/2020 Referral Source:[employer] Discharge Report Date of Discharge:03/29/2020 Facility:[facility] [place] Medicare   Multiple attempts to establish contact with patient without success. No response from letter mailed to patient. Case is being closed at this time.    Plan: RN CM will close case at this time.   Enzo Montgomery, RN,BSN,CCM [place] Management Telephonic Care Management Coordinator Direct Phone: [phone] Toll Free: [phone] Fax: [phone]

## 2020-04-16 ENCOUNTER — Other Ambulatory Visit: Payer: Self-pay | Admitting: Osteopathic Medicine

## 2020-04-16 DIAGNOSIS — I10 Essential (primary) hypertension: Secondary | ICD-10-CM

## 2020-04-28 ENCOUNTER — Ambulatory Visit (INDEPENDENT_AMBULATORY_CARE_PROVIDER_SITE_OTHER): Payer: Medicare HMO | Admitting: Osteopathic Medicine

## 2020-04-28 ENCOUNTER — Encounter: Payer: Self-pay | Admitting: Osteopathic Medicine

## 2020-04-28 ENCOUNTER — Telehealth: Payer: Self-pay | Admitting: Osteopathic Medicine

## 2020-04-28 VITALS — BP 140/83 | HR 71 | Temp 97.9°F | Wt 129.0 lb

## 2020-04-28 DIAGNOSIS — R7989 Other specified abnormal findings of blood chemistry: Secondary | ICD-10-CM | POA: Diagnosis not present

## 2020-04-28 DIAGNOSIS — E039 Hypothyroidism, unspecified: Secondary | ICD-10-CM

## 2020-04-28 DIAGNOSIS — J4530 Mild persistent asthma, uncomplicated: Secondary | ICD-10-CM | POA: Diagnosis not present

## 2020-04-28 DIAGNOSIS — R5382 Chronic fatigue, unspecified: Secondary | ICD-10-CM

## 2020-04-28 MED ORDER — LEVOTHYROXINE SODIUM 50 MCG PO TABS: 50.0000 ug | ORAL_TABLET | Freq: Every day | ORAL | 0 refills | Status: DC

## 2020-04-28 NOTE — Telephone Encounter (Signed)
She has already followed up with pulmonology. She states she is tired and wants her thyroid checked.   Note from pulmonology - Pneumonia due to infectious organism right lower lobe Hospitalized in [place], [name] 03/27/2020 - 03/29/2020 for pneumonia. Treated with Ceftin, Azithromycin, and Prednisone. Says her SOB and cough are improving. CXR today shows complete clearing of RLL infiltrates.

## 2020-04-28 NOTE — Progress Notes (Signed)
Margaret [name] is a 74 y.o. female who presents to  [place] at [facility]  today, 04/28/20, seeking care for the following: . Patient seen and treated in hospital in [facility] about a month ago for pneumonia.  Reports breathing is improved at this point, following with pulmonology for history of bronchiectasis.  Concerned about some persistent fatigue issues, she has not been taking her thyroid medication, there is a misunderstanding about dosage instructions.     ASSESSMENT & PLAN with other pertinent history/findings:  The primary encounter diagnosis was Hypothyroidism, unspecified type. Diagnoses of Mild persistent asthma, unspecified whether complicated and Chronic fatigue were also pertinent to this visit.    Orders Placed This Encounter  Procedures  . CBC  . COMPLETE METABOLIC PANEL WITH GFR  . TSH  . Fe+TIBC+Fer    Meds ordered this encounter  Medications  . levothyroxine (SYNTHROID) 50 MCG tablet    Sig: Take 1 tablet (50 mcg total) by mouth daily.    Dispense:  90 tablet    Refill:  0       Follow-up instructions: Return for RECHECK PENDING RESULTS / IF WORSE OR CHANGE.                                         BP 140/83 (BP Location: Left Arm, Patient Position: Sitting, Cuff Size: Normal)   Pulse 71   Temp 97.9 F (36.6 C) (Oral)   Wt 129 lb (58.5 kg)   BMI 24.37 kg/m   Current Meds  Medication Sig  . albuterol (PROVENTIL HFA;VENTOLIN HFA) 108 (90 Base) MCG/ACT inhaler Inhale 1-2 puffs into the lungs every 4 (four) hours as needed for wheezing.  [name] amLODipine (NORVASC) 10 MG tablet TAKE 1 TABLET BY MOUTH EVERY DAY  . FLUoxetine (PROZAC) 40 MG capsule Take 1 capsule (40 mg total) by mouth daily.  [name] HYDROcodone-acetaminophen (NORCO) 10-325 MG tablet Take 1 tablet by mouth every 6 (six) hours as needed.   [name] levothyroxine (SYNTHROID) 50 MCG tablet Take 1 tablet  (50 mcg total) by mouth daily.  . montelukast (SINGULAIR) 10 MG tablet Take 10 mg by mouth daily.  [name] omeprazole (PRILOSEC) 40 MG capsule Take 1 capsule (40 mg total) by mouth daily.  . [DISCONTINUED] levothyroxine (SYNTHROID) 50 MCG tablet TAKE 1 TABLET BY MOUTH EVERY DAY     No results found.  Depression screen [facility] 2/9 12/30/2019 06/08/2018 05/18/2017  Decreased Interest 0 0 0  Down, Depressed, Hopeless 0 0 0  PHQ - 2 Score 0 0 0  Altered sleeping 0 0 1  Tired, decreased energy 0 0 1  Change in appetite 1 3 0  Feeling bad or failure about yourself  0 0 0  Trouble concentrating 0 3 0  Moving slowly or fidgety/restless 0 0 0  Suicidal thoughts 0 0 0  PHQ-9 Score 1 6 2   Difficult doing work/chores Not difficult at all Not difficult at all -    GAD 7 : Generalized Anxiety Score 12/30/2019 06/08/2018 05/18/2017  Nervous, Anxious, on Edge 0 0 1  Control/stop worrying 0 0 0  Worry too much - different things 0 0 1  Trouble relaxing 0 0 1  Restless 0 0 1  Easily annoyed or irritable 0 0 0  Afraid - awful might happen 0 0 0  Total GAD 7 Score 0 0  4  Anxiety Difficulty Not difficult at all - -      All questions at time of visit were answered - patient instructed to contact office with any additional concerns or updates.  ER/RTC precautions were reviewed with the patient.  Please note: voice recognition software was used to produce this document, and typos may escape review. Please contact Dr. Sheppard Coil for any needed clarifications.

## 2020-04-28 NOTE — Telephone Encounter (Signed)
Patient has hospital follow-up scheduled this afternoon, can we call her to confirm  1. which hospital she was seen at 2. what her illness was 3. what dates she was there Need to request records ASAP  (I looked through care everywhere and did not see anything.  Appointment note says was somewhere in [facility])

## 2020-04-29 ENCOUNTER — Telehealth: Payer: Self-pay

## 2020-04-29 LAB — IRON,TIBC AND FERRITIN PANEL
%SAT: 84 % (calc) — ABNORMAL HIGH (ref 16–45)
Ferritin: 39 ng/mL (ref 16–288)
Iron: 279 ug/dL — ABNORMAL HIGH (ref 45–160)
TIBC: 333 mcg/dL (calc) (ref 250–450)

## 2020-04-29 LAB — COMPLETE METABOLIC PANEL WITH GFR
AG Ratio: 2 (calc) (ref 1.0–2.5)
ALT: 18 U/L (ref 6–29)
AST: 21 U/L (ref 10–35)
Albumin: 4.3 g/dL (ref 3.6–5.1)
Alkaline phosphatase (APISO): 66 U/L (ref 37–153)
BUN/Creatinine Ratio: 20 (calc) (ref 6–22)
BUN: 19 mg/dL (ref 7–25)
CO2: 28 mmol/L (ref 20–32)
Calcium: 9.6 mg/dL (ref 8.6–10.4)
Chloride: 103 mmol/L (ref 98–110)
Creat: 0.94 mg/dL — ABNORMAL HIGH (ref 0.60–0.93)
GFR, Est African American: 70 mL/min/{1.73_m2} (ref >=60)
GFR, Est Non African American: 60 mL/min/{1.73_m2} (ref >=60)
Globulin: 2.1 g/dL (calc) (ref 1.9–3.7)
Glucose, Bld: 87 mg/dL (ref 65–99)
Potassium: 4.6 mmol/L (ref 3.5–5.3)
Sodium: 139 mmol/L (ref 135–146)
Total Bilirubin: 0.4 mg/dL (ref 0.2–1.2)
Total Protein: 6.4 g/dL (ref 6.1–8.1)

## 2020-04-29 LAB — CBC
HCT: 38.2 % (ref 35.0–45.0)
Hemoglobin: 12.7 g/dL (ref 11.7–15.5)
MCH: 32.5 pg (ref 27.0–33.0)
MCHC: 33.2 g/dL (ref 32.0–36.0)
MCV: 97.7 fL (ref 80.0–100.0)
MPV: 9.7 fL (ref 7.5–12.5)
Platelets: 323 10*3/uL (ref 140–400)
RBC: 3.91 10*6/uL (ref 3.80–5.10)
RDW: 14 % (ref 11.0–15.0)
WBC: 7.7 10*3/uL (ref 3.8–10.8)

## 2020-04-29 LAB — TSH: TSH: 6.4 mIU/L — ABNORMAL HIGH (ref 0.40–4.50)

## 2020-04-29 NOTE — Telephone Encounter (Signed)
Margaret [name] called for results.

## 2020-05-01 ENCOUNTER — Other Ambulatory Visit: Payer: Self-pay | Admitting: Osteopathic Medicine

## 2020-05-01 DIAGNOSIS — I10 Essential (primary) hypertension: Secondary | ICD-10-CM

## 2020-05-20 ENCOUNTER — Telehealth (INDEPENDENT_AMBULATORY_CARE_PROVIDER_SITE_OTHER): Payer: Medicare HMO | Admitting: Osteopathic Medicine

## 2020-05-20 ENCOUNTER — Other Ambulatory Visit: Payer: Self-pay

## 2020-05-20 ENCOUNTER — Encounter: Payer: Self-pay | Admitting: Osteopathic Medicine

## 2020-05-20 VITALS — Temp 99.0°F | Wt 129.0 lb

## 2020-05-20 DIAGNOSIS — R0981 Nasal congestion: Secondary | ICD-10-CM

## 2020-05-20 DIAGNOSIS — J019 Acute sinusitis, unspecified: Secondary | ICD-10-CM

## 2020-05-20 DIAGNOSIS — R05 Cough: Secondary | ICD-10-CM

## 2020-05-20 DIAGNOSIS — R059 Cough, unspecified: Secondary | ICD-10-CM

## 2020-05-20 MED ORDER — HYDROCODONE-HOMATROPINE 5-1.5 MG/5ML PO SYRP: 5.0000 mL | ORAL_SOLUTION | Freq: Four times a day (QID) | ORAL | 0 refills | Status: DC | PRN

## 2020-05-20 MED ORDER — AMOXICILLIN-POT CLAVULANATE 875-125 MG PO TABS: 1.0000 | ORAL_TABLET | Freq: Two times a day (BID) | ORAL | 0 refills | Status: AC

## 2020-05-20 MED ORDER — IPRATROPIUM BROMIDE 0.06 % NA SOLN: 2.0000 | Freq: Four times a day (QID) | NASAL | 1 refills | Status: DC

## 2020-05-20 NOTE — Progress Notes (Signed)
Virtual Visit via Video (App used: MyChart) Note  I connected with      Margaret [name] on 05/20/20 at 8:44 AM  by a telemedicine application and verified that I am speaking with the correct person using two identifiers.  Patient is at home I am in office   I discussed the limitations of evaluation and management by telemedicine and the availability of in person appointments. The patient expressed understanding and agreed to proceed.  History of Present Illness: Margaret [name] is a 74 y.o. female who would like to discuss congestion localized to L side of face under eye and into upper teeth on that side, into L ear. No OTC meds tried. Ongoing about 4 days now. Reports fever yesterday temp up to 99. Started w/ coughing yesterday, "I'm always short-winded." No increased SOB.       Observations/Objective: Temp 99 F (37.2 C)   Wt 129 lb (58.5 kg)   BMI 24.37 kg/m  BP Readings from Last 3 Encounters:  04/28/20 140/83  12/30/19 (!) 149/81  02/12/19 124/83   Exam: Normal Speech.    Lab and Radiology Results No results found for this or any previous visit (from the past 72 hour(s)). No results found.     Assessment and Plan: 74 y.o. female with The primary encounter diagnosis was Acute non-recurrent sinusitis, unspecified location. Diagnoses of Cough and Sinus congestion were also pertinent to this visit.   PDMP not reviewed this encounter. No orders of the defined types were placed in this encounter.  Meds ordered this encounter  Medications  . amoxicillin-clavulanate (AUGMENTIN) 875-125 MG tablet    Sig: Take 1 tablet by mouth 2 (two) times daily for 7 days.    Dispense:  14 tablet    Refill:  0  . ipratropium (ATROVENT) 0.06 % nasal spray    Sig: Place 2 sprays into both nostrils 4 (four) times daily. Prn sinus congestion    Dispense:  15 mL    Refill:  1  . HYDROcodone-homatropine (HYCODAN) 5-1.5 MG/5ML syrup    Sig: Take 5 mLs by mouth every 6 (six) hours as  needed for cough.    Dispense:  120 mL    Refill:  0   Patient Instructions  Over-the-Counter Medications & Home Remedies for Upper Respiratory Illness  Aches/Pains, Fever, Headache Acetaminophen (Tylenol) 500 mg tablets - take max 2 tablets (1000 mg) every 6 hours (4 times per day)  Ibuprofen (Motrin) 200 mg tablets - take max 4 tablets (800 mg) every 6 hours*  Sinus Congestion Prescription Atrovent as directed Nasal Saline if desired Oxymetolazone (Afrin, others) sparing use due to rebound congestion, NEVER use in kids Phenylephrine (Sudafed) 10 mg tablets every 4 hours (or the 12-hour formulation)* Diphenhydramine (Benadryl) 25 mg tablets - take max 2 tablets every 4 hours  Cough & Sore Throat Prescription cough pills or syrups as directed Dextromethorphan (Robitussin, others) - cough suppressant Guaifenesin (Robitussin, Mucinex, others) - expectorant (helps cough up mucus) (Dextromethorphan and Guaifenesin also come in a combination tablet) Lozenges w/ Benzocaine + Menthol (Cepacol) Honey - as much as you want! Teas which "coat the throat" - look for ingredients Elm Bark, Licorice Root, Marshmallow Root  Other Antibiotics if these are prescribed - take ALL, even if you're feeling better  Don't waste your money on Vitamin C or Echinacea  *Caution in patients with high blood pressure      Instructions sent via MyChart. If MyChart not available, pt was given option for  info via personal e-mail w/ no guarantee of protected health info over unsecured e-mail communication, and MyChart sign-up instructions were sent to patient.   Follow Up Instructions: Return if symptoms worsen or fail to improve.    I discussed the assessment and treatment plan with the patient. The patient was provided an opportunity to ask questions and all were answered. The patient agreed with the plan and demonstrated an understanding of the instructions.   The patient was advised to call back or seek  an in-person evaluation if any new concerns, if symptoms worsen or if the condition fails to improve as anticipated.  30 minutes of non-face-to-face time was provided during this encounter.      . . . . . . . . . . . . . [name]                   Historical information moved to improve visibility of documentation.  Past Medical History:  Diagnosis Date  . Anemia   . Anxiety   . Arthritis   . Asthma   . Cancer ([place])   . COPD (chronic obstructive pulmonary disease) ([place])   . Emphysema of lung ([place])   . GERD (gastroesophageal reflux disease)   . Hypertension   . Osteoporosis   . Thyroid disease    Past Surgical History:  Procedure Laterality Date  . ABDOMINAL HYSTERECTOMY    . BREAST SURGERY    . CESAREAN SECTION    . JOINT REPLACEMENT     LT KNEE  . TOTAL KNEE ARTHROPLASTY  2006   Social History   Tobacco Use  . Smoking status: Former Smoker    Packs/day: 1.50    Years: 40.00    Pack years: 60.00    Quit date: 02/01/2008    Years since quitting: 12.3  . Smokeless tobacco: Never Used  Substance Use Topics  . Alcohol use: Yes    Comment: SOCIALLY   family history includes Alcohol abuse in her father; Alcohol abuse (age of onset: 9) in her brother; COPD in her sister; Cancer (age of onset: 73) in her maternal grandmother; Cirrhosis in her brother; Emphysema (age of onset: 36) in her mother; Stroke in an other family member; Stroke (age of onset: 27) in her maternal grandfather.  Medications: Current Outpatient Medications  Medication Sig Dispense Refill  . albuterol (PROVENTIL HFA;VENTOLIN HFA) 108 (90 Base) MCG/ACT inhaler Inhale 1-2 puffs into the lungs every 4 (four) hours as needed for wheezing. 1 Inhaler 0  . amLODipine (NORVASC) 10 MG tablet TAKE 1 TABLET BY MOUTH EVERY DAY 90 tablet 1  . FLUoxetine (PROZAC) 40 MG capsule Take 1 capsule (40 mg total) by mouth daily. 90 capsule 0  . HYDROcodone-acetaminophen (NORCO) 10-325 MG tablet Take  1 tablet by mouth every 6 (six) hours as needed.     [name] levothyroxine (SYNTHROID) 50 MCG tablet Take 1 tablet (50 mcg total) by mouth daily. 90 tablet 0  . omeprazole (PRILOSEC) 40 MG capsule Take 1 capsule (40 mg total) by mouth daily. 180 capsule 3  . amoxicillin-clavulanate (AUGMENTIN) 875-125 MG tablet Take 1 tablet by mouth 2 (two) times daily for 7 days. 14 tablet 0  . HYDROcodone-homatropine (HYCODAN) 5-1.5 MG/5ML syrup Take 5 mLs by mouth every 6 (six) hours as needed for cough. 120 mL 0  . ipratropium (ATROVENT) 0.06 % nasal spray Place 2 sprays into both nostrils 4 (four) times daily. Prn sinus congestion 15 mL 1  . lidocaine (XYLOCAINE) 2 %  jelly Apply 1 application topically as needed. (Patient not taking: Reported on 04/28/2020) 30 mL 0  . montelukast (SINGULAIR) 10 MG tablet Take 10 mg by mouth daily.  2   No current facility-administered medications for this visit.   Allergies  Allergen Reactions  . Neomycin-Bacitracin Zn-Polymyx Anaphylaxis  . Clarithromycin Other (See Comments)    THRUSH  . Other Other (See Comments)    ANTIBIOTIC OINT - UNSURE OF NAME  hives    . Raloxifene Other (See Comments)    Back pain/ PT NOT SURE ABOUT THIS REACTION  PT NOT SURE ABOUT THIS REACTION  . Tetracyclines & Related     Other reaction(s): Other SERVER INDIGESTION  . Neosporin [Neomycin-Polymyxin-Gramicidin] Hives  . Ofloxacin Hives and Nausea And Vomiting  . Tetracycline Other (See Comments)

## 2020-05-20 NOTE — Patient Instructions (Signed)
Over-the-Counter Medications & Home Remedies for Upper Respiratory Illness  Aches/Pains, Fever, Headache Acetaminophen (Tylenol) 500 mg tablets - take max 2 tablets (1000 mg) every 6 hours (4 times per day)  Ibuprofen (Motrin) 200 mg tablets - take max 4 tablets (800 mg) every 6 hours*  Sinus Congestion Prescription Atrovent as directed Nasal Saline if desired Oxymetolazone (Afrin, others) sparing use due to rebound congestion, NEVER use in kids Phenylephrine (Sudafed) 10 mg tablets every 4 hours (or the 12-hour formulation)* Diphenhydramine (Benadryl) 25 mg tablets - take max 2 tablets every 4 hours  Cough & Sore Throat Prescription cough pills or syrups as directed Dextromethorphan (Robitussin, others) - cough suppressant Guaifenesin (Robitussin, Mucinex, others) - expectorant (helps cough up mucus) (Dextromethorphan and Guaifenesin also come in a combination tablet) Lozenges w/ Benzocaine + Menthol (Cepacol) Honey - as much as you want! Teas which "coat the throat" - look for ingredients Elm Bark, Licorice Root, Marshmallow Root  Other Antibiotics if these are prescribed - take ALL, even if you're feeling better  Don't waste your money on Vitamin C or Echinacea  *Caution in patients with high blood pressure

## 2020-05-22 ENCOUNTER — Other Ambulatory Visit: Payer: Self-pay | Admitting: Osteopathic Medicine

## 2020-05-22 DIAGNOSIS — G894 Chronic pain syndrome: Secondary | ICD-10-CM | POA: Diagnosis not present

## 2020-05-22 DIAGNOSIS — M25552 Pain in left hip: Secondary | ICD-10-CM | POA: Diagnosis not present

## 2020-05-22 DIAGNOSIS — F418 Other specified anxiety disorders: Secondary | ICD-10-CM

## 2020-05-22 DIAGNOSIS — M4316 Spondylolisthesis, lumbar region: Secondary | ICD-10-CM | POA: Diagnosis not present

## 2020-05-22 DIAGNOSIS — M545 Low back pain: Secondary | ICD-10-CM | POA: Diagnosis not present

## 2020-05-22 DIAGNOSIS — M25551 Pain in right hip: Secondary | ICD-10-CM | POA: Diagnosis not present

## 2020-06-11 ENCOUNTER — Other Ambulatory Visit: Payer: Self-pay | Admitting: Osteopathic Medicine

## 2020-06-30 DIAGNOSIS — J432 Centrilobular emphysema: Secondary | ICD-10-CM | POA: Diagnosis not present

## 2020-06-30 DIAGNOSIS — J471 Bronchiectasis with (acute) exacerbation: Secondary | ICD-10-CM | POA: Diagnosis not present

## 2020-06-30 DIAGNOSIS — Z8701 Personal history of pneumonia (recurrent): Secondary | ICD-10-CM | POA: Diagnosis not present

## 2020-06-30 DIAGNOSIS — R918 Other nonspecific abnormal finding of lung field: Secondary | ICD-10-CM | POA: Diagnosis not present

## 2020-07-02 ENCOUNTER — Other Ambulatory Visit: Payer: Self-pay | Admitting: Osteopathic Medicine

## 2020-07-14 DIAGNOSIS — Z20822 Contact with and (suspected) exposure to covid-19: Secondary | ICD-10-CM | POA: Diagnosis not present

## 2020-07-14 DIAGNOSIS — U071 COVID-19: Secondary | ICD-10-CM | POA: Diagnosis not present

## 2020-07-16 DIAGNOSIS — U071 COVID-19: Secondary | ICD-10-CM | POA: Insufficient documentation

## 2020-07-16 DIAGNOSIS — J441 Chronic obstructive pulmonary disease with (acute) exacerbation: Secondary | ICD-10-CM | POA: Diagnosis not present

## 2020-07-16 DIAGNOSIS — J471 Bronchiectasis with (acute) exacerbation: Secondary | ICD-10-CM | POA: Diagnosis not present

## 2020-07-16 HISTORY — DX: COVID-19: U07.1

## 2020-07-17 DIAGNOSIS — U071 COVID-19: Secondary | ICD-10-CM | POA: Diagnosis not present

## 2020-07-17 DIAGNOSIS — Z23 Encounter for immunization: Secondary | ICD-10-CM | POA: Diagnosis not present

## 2020-08-04 DIAGNOSIS — G894 Chronic pain syndrome: Secondary | ICD-10-CM | POA: Diagnosis not present

## 2020-08-04 DIAGNOSIS — M545 Low back pain: Secondary | ICD-10-CM | POA: Diagnosis not present

## 2020-08-04 DIAGNOSIS — M4316 Spondylolisthesis, lumbar region: Secondary | ICD-10-CM | POA: Diagnosis not present

## 2020-08-04 DIAGNOSIS — M25551 Pain in right hip: Secondary | ICD-10-CM | POA: Diagnosis not present

## 2020-08-16 DIAGNOSIS — J441 Chronic obstructive pulmonary disease with (acute) exacerbation: Secondary | ICD-10-CM | POA: Diagnosis not present

## 2020-09-08 DIAGNOSIS — R918 Other nonspecific abnormal finding of lung field: Secondary | ICD-10-CM | POA: Diagnosis not present

## 2020-09-08 DIAGNOSIS — J432 Centrilobular emphysema: Secondary | ICD-10-CM | POA: Diagnosis not present

## 2020-09-09 DIAGNOSIS — Z8701 Personal history of pneumonia (recurrent): Secondary | ICD-10-CM | POA: Diagnosis not present

## 2020-09-14 DIAGNOSIS — J471 Bronchiectasis with (acute) exacerbation: Secondary | ICD-10-CM | POA: Diagnosis not present

## 2020-09-14 DIAGNOSIS — J189 Pneumonia, unspecified organism: Secondary | ICD-10-CM | POA: Diagnosis not present

## 2020-09-14 DIAGNOSIS — Z8701 Personal history of pneumonia (recurrent): Secondary | ICD-10-CM | POA: Diagnosis not present

## 2020-09-15 DIAGNOSIS — J441 Chronic obstructive pulmonary disease with (acute) exacerbation: Secondary | ICD-10-CM | POA: Diagnosis not present

## 2020-09-16 DIAGNOSIS — J471 Bronchiectasis with (acute) exacerbation: Secondary | ICD-10-CM | POA: Diagnosis not present

## 2020-09-16 DIAGNOSIS — R918 Other nonspecific abnormal finding of lung field: Secondary | ICD-10-CM | POA: Diagnosis not present

## 2020-09-16 DIAGNOSIS — J432 Centrilobular emphysema: Secondary | ICD-10-CM | POA: Diagnosis not present

## 2020-09-16 DIAGNOSIS — Z8701 Personal history of pneumonia (recurrent): Secondary | ICD-10-CM | POA: Diagnosis not present

## 2020-10-04 ENCOUNTER — Other Ambulatory Visit: Payer: Self-pay | Admitting: Osteopathic Medicine

## 2020-10-04 DIAGNOSIS — I10 Essential (primary) hypertension: Secondary | ICD-10-CM

## 2020-10-05 DIAGNOSIS — M4316 Spondylolisthesis, lumbar region: Secondary | ICD-10-CM | POA: Diagnosis not present

## 2020-10-05 DIAGNOSIS — Z79899 Other long term (current) drug therapy: Secondary | ICD-10-CM | POA: Diagnosis not present

## 2020-10-05 DIAGNOSIS — M25551 Pain in right hip: Secondary | ICD-10-CM | POA: Diagnosis not present

## 2020-10-05 DIAGNOSIS — G894 Chronic pain syndrome: Secondary | ICD-10-CM | POA: Diagnosis not present

## 2020-10-05 DIAGNOSIS — Z5181 Encounter for therapeutic drug level monitoring: Secondary | ICD-10-CM | POA: Diagnosis not present

## 2020-10-05 DIAGNOSIS — M545 Low back pain, unspecified: Secondary | ICD-10-CM | POA: Diagnosis not present

## 2020-10-07 DIAGNOSIS — R9389 Abnormal findings on diagnostic imaging of other specified body structures: Secondary | ICD-10-CM | POA: Diagnosis not present

## 2020-10-07 DIAGNOSIS — A310 Pulmonary mycobacterial infection: Secondary | ICD-10-CM | POA: Diagnosis not present

## 2020-10-07 DIAGNOSIS — M25551 Pain in right hip: Secondary | ICD-10-CM | POA: Diagnosis not present

## 2020-10-07 HISTORY — DX: Pulmonary mycobacterial infection: A31.0

## 2020-10-16 DIAGNOSIS — J441 Chronic obstructive pulmonary disease with (acute) exacerbation: Secondary | ICD-10-CM | POA: Diagnosis not present

## 2020-11-02 ENCOUNTER — Other Ambulatory Visit: Payer: Self-pay | Admitting: *Deleted

## 2020-11-02 ENCOUNTER — Telehealth: Payer: Self-pay | Admitting: *Deleted

## 2020-11-02 NOTE — Telephone Encounter (Signed)
Scheduled appt with patient

## 2020-11-02 NOTE — Telephone Encounter (Signed)
Schedule visit in office, pt to bring home BP machine w/ her

## 2020-11-02 NOTE — Telephone Encounter (Signed)
Pt left vm stating that she "needs a stronger bp med". She said that her bp this morning was 149/128.  Pt is only on Amlodipine 10mg .

## 2020-11-03 ENCOUNTER — Other Ambulatory Visit: Payer: Self-pay

## 2020-11-03 ENCOUNTER — Encounter: Payer: Self-pay | Admitting: Osteopathic Medicine

## 2020-11-03 ENCOUNTER — Ambulatory Visit (INDEPENDENT_AMBULATORY_CARE_PROVIDER_SITE_OTHER): Payer: Medicare HMO | Admitting: Osteopathic Medicine

## 2020-11-03 VITALS — BP 148/96 | HR 69 | Temp 97.8°F | Wt 122.0 lb

## 2020-11-03 DIAGNOSIS — I10 Essential (primary) hypertension: Secondary | ICD-10-CM | POA: Diagnosis not present

## 2020-11-03 DIAGNOSIS — U071 COVID-19: Secondary | ICD-10-CM | POA: Diagnosis not present

## 2020-11-03 DIAGNOSIS — F418 Other specified anxiety disorders: Secondary | ICD-10-CM

## 2020-11-03 DIAGNOSIS — A310 Pulmonary mycobacterial infection: Secondary | ICD-10-CM

## 2020-11-03 MED ORDER — AMBULATORY NON FORMULARY MEDICATION: 99 refills | Status: DC

## 2020-11-03 MED ORDER — LOSARTAN POTASSIUM 25 MG PO TABS: 25.0000 mg | ORAL_TABLET | Freq: Every day | ORAL | 0 refills | Status: DC

## 2020-11-03 MED ORDER — FLUOXETINE HCL 20 MG PO CAPS: 20.0000 mg | ORAL_CAPSULE | Freq: Every day | ORAL | 3 refills | Status: DC

## 2020-11-03 NOTE — Patient Instructions (Addendum)
Home BP machine NOT accurate! Please discard!  OK for new home BP machine, Rx printed Starting new BP Rx Losartan , take in addition to your usual medications  Ask pharmacist about: Shingrix vaccine? Are you on any medications that will cause hair loss?

## 2020-11-03 NOTE — Progress Notes (Signed)
Margaret [name] is a 74 y.o. female who presents to  [place] at [facility]  today, 11/03/20, seeking care for the following:  . BP concerns  o Home BP monitor 155/108 compared to our which was 148/96 and 163/84 --> diastolic greater than 10 mmHg off. Was reporting 149/128 at home.  o Typically checks home BP in AM and PM most days  o Repots has been checking it more over the last week d/t feeling some pressure behind eyes, no headache or vertigo, no hearing change, no sore throat, no fever/cough.    Requests refill on fluoxetine at 20 mg instead of 40 mg    Following w/ infectious disease for pulmonary MAC, recently started azithromycin and ethambutol     ASSESSMENT & PLAN with other pertinent findings:  The primary encounter diagnosis was Essential hypertension. Diagnoses of Depression with anxiety, Mycobacterium avium complex ([place]), and COVID-19 were also pertinent to this visit.    Patient Instructions  Home BP machine NOT accurate! Please discard!  OK for new home BP machine, Rx printed Starting new BP Rx Losartan , take in addition to your usual medications  Ask pharmacist about: Shingrix vaccine? Are you on any medications that will cause hair loss?      No orders of the defined types were placed in this encounter.   Meds ordered this encounter  Medications  . losartan (COZAAR) 25 MG tablet    Sig: Take 1 tablet (25 mg total) by mouth daily.    Dispense:  90 tablet    Refill:  0  . AMBULATORY NON FORMULARY MEDICATION    Sig: Medication Name: home BP monitor, upper arm    Dispense:  1 Units    Refill:  99  . FLUoxetine (PROZAC) 20 MG capsule    Sig: Take 1 capsule (20 mg total) by mouth daily. In combination with Phentermine    Dispense:  90 capsule    Refill:  3       Follow-up instructions: Return in about 1 week (around 11/10/2020) for NURSE VISIT BP CHECK  .                                         BP (!) 148/96 (BP Location: Left Arm, Patient Position: Sitting, Cuff Size: Normal)   Pulse 69   Temp 97.8 F (36.6 C) (Oral)   Wt 122 lb 0.6 oz (55.4 kg)   BMI 23.06 kg/m   Current Meds  Medication Sig  . albuterol (PROVENTIL HFA;VENTOLIN HFA) 108 (90 Base) MCG/ACT inhaler Inhale 1-2 puffs into the lungs every 4 (four) hours as needed for wheezing.  [name] amLODipine (NORVASC) 10 MG tablet TAKE 1 TABLET BY MOUTH EVERY DAY  . ethambutol (MYAMBUTOL) 400 MG tablet Take by mouth.  . Fluticasone Furoate (ARNUITY ELLIPTA) 100 MCG/ACT AEPB INHALE ONE PUFF INTO THE LUNGS DAILY FOR 30 DAYS. RINSE MOUTH AFTER EACH USE.  [name] HYDROcodone-acetaminophen (NORCO) 10-325 MG tablet Take 1 tablet by mouth every 6 (six) hours as needed.   [name] ipratropium (ATROVENT) 0.06 % nasal spray PLACE 2 SPRAYS INTO BOTH NOSTRILS 4 (FOUR) TIMES DAILY. PRN SINUS CONGESTION  . levothyroxine (SYNTHROID) 50 MCG tablet Take 1 tablet (50 mcg total) by mouth daily.  [name] omeprazole (PRILOSEC) 40 MG capsule Take 1 capsule (40 mg total) by mouth daily.  . [DISCONTINUED] FLUoxetine (PROZAC) 40  MG capsule TAKE 1 CAPSULE BY MOUTH EVERY DAY    No results found for this or any previous visit (from the past 72 hour(s)).  No results found.     All questions at time of visit were answered - patient instructed to contact office with any additional concerns or updates.  ER/RTC precautions were reviewed with the patient as applicable.   Please note: voice recognition software was used to produce this document, and typos may escape review. Please contact Dr. Sheppard Coil for any needed clarifications.

## 2020-11-10 ENCOUNTER — Ambulatory Visit: Payer: Medicare HMO

## 2020-11-11 ENCOUNTER — Ambulatory Visit: Payer: Medicare HMO

## 2020-11-15 DIAGNOSIS — J441 Chronic obstructive pulmonary disease with (acute) exacerbation: Secondary | ICD-10-CM | POA: Diagnosis not present

## 2020-11-18 DIAGNOSIS — A310 Pulmonary mycobacterial infection: Secondary | ICD-10-CM | POA: Diagnosis not present

## 2020-11-18 DIAGNOSIS — R9389 Abnormal findings on diagnostic imaging of other specified body structures: Secondary | ICD-10-CM | POA: Diagnosis not present

## 2020-11-18 DIAGNOSIS — J45909 Unspecified asthma, uncomplicated: Secondary | ICD-10-CM | POA: Diagnosis not present

## 2020-11-23 DIAGNOSIS — M25551 Pain in right hip: Secondary | ICD-10-CM | POA: Diagnosis not present

## 2020-11-23 DIAGNOSIS — I10 Essential (primary) hypertension: Secondary | ICD-10-CM | POA: Insufficient documentation

## 2020-11-23 DIAGNOSIS — M1611 Unilateral primary osteoarthritis, right hip: Secondary | ICD-10-CM | POA: Diagnosis not present

## 2020-12-01 DIAGNOSIS — M25551 Pain in right hip: Secondary | ICD-10-CM | POA: Diagnosis not present

## 2020-12-01 DIAGNOSIS — G894 Chronic pain syndrome: Secondary | ICD-10-CM | POA: Diagnosis not present

## 2020-12-01 DIAGNOSIS — G8929 Other chronic pain: Secondary | ICD-10-CM | POA: Diagnosis not present

## 2020-12-01 DIAGNOSIS — M25552 Pain in left hip: Secondary | ICD-10-CM | POA: Diagnosis not present

## 2020-12-01 DIAGNOSIS — M545 Low back pain, unspecified: Secondary | ICD-10-CM | POA: Diagnosis not present

## 2020-12-01 DIAGNOSIS — F1190 Opioid use, unspecified, uncomplicated: Secondary | ICD-10-CM | POA: Diagnosis not present

## 2020-12-01 DIAGNOSIS — M4316 Spondylolisthesis, lumbar region: Secondary | ICD-10-CM | POA: Diagnosis not present

## 2020-12-01 DIAGNOSIS — Z79899 Other long term (current) drug therapy: Secondary | ICD-10-CM | POA: Diagnosis not present

## 2020-12-08 ENCOUNTER — Telehealth: Payer: Self-pay | Admitting: General Practice

## 2020-12-09 NOTE — Telephone Encounter (Signed)
Documentation

## 2020-12-16 DIAGNOSIS — J441 Chronic obstructive pulmonary disease with (acute) exacerbation: Secondary | ICD-10-CM | POA: Diagnosis not present

## 2021-01-01 DIAGNOSIS — L650 Telogen effluvium: Secondary | ICD-10-CM | POA: Diagnosis not present

## 2021-01-01 DIAGNOSIS — L658 Other specified nonscarring hair loss: Secondary | ICD-10-CM | POA: Diagnosis not present

## 2021-01-04 DIAGNOSIS — J432 Centrilobular emphysema: Secondary | ICD-10-CM | POA: Diagnosis not present

## 2021-01-04 DIAGNOSIS — R918 Other nonspecific abnormal finding of lung field: Secondary | ICD-10-CM | POA: Diagnosis not present

## 2021-01-04 DIAGNOSIS — J9811 Atelectasis: Secondary | ICD-10-CM | POA: Diagnosis not present

## 2021-01-04 DIAGNOSIS — Q2546 Tortuous aortic arch: Secondary | ICD-10-CM | POA: Diagnosis not present

## 2021-01-15 ENCOUNTER — Other Ambulatory Visit: Payer: Self-pay | Admitting: Osteopathic Medicine

## 2021-01-16 DIAGNOSIS — J441 Chronic obstructive pulmonary disease with (acute) exacerbation: Secondary | ICD-10-CM | POA: Diagnosis not present

## 2021-01-19 DIAGNOSIS — A310 Pulmonary mycobacterial infection: Secondary | ICD-10-CM | POA: Diagnosis not present

## 2021-01-19 DIAGNOSIS — M25551 Pain in right hip: Secondary | ICD-10-CM | POA: Diagnosis not present

## 2021-01-19 DIAGNOSIS — J479 Bronchiectasis, uncomplicated: Secondary | ICD-10-CM | POA: Diagnosis not present

## 2021-01-19 DIAGNOSIS — Z8616 Personal history of COVID-19: Secondary | ICD-10-CM | POA: Diagnosis not present

## 2021-01-19 DIAGNOSIS — G894 Chronic pain syndrome: Secondary | ICD-10-CM | POA: Diagnosis not present

## 2021-01-19 DIAGNOSIS — R911 Solitary pulmonary nodule: Secondary | ICD-10-CM | POA: Diagnosis not present

## 2021-01-19 DIAGNOSIS — Z01810 Encounter for preprocedural cardiovascular examination: Secondary | ICD-10-CM | POA: Diagnosis not present

## 2021-01-19 DIAGNOSIS — M1611 Unilateral primary osteoarthritis, right hip: Secondary | ICD-10-CM | POA: Diagnosis not present

## 2021-01-19 DIAGNOSIS — Z01818 Encounter for other preprocedural examination: Secondary | ICD-10-CM | POA: Diagnosis not present

## 2021-01-19 DIAGNOSIS — J432 Centrilobular emphysema: Secondary | ICD-10-CM | POA: Diagnosis not present

## 2021-01-19 DIAGNOSIS — R918 Other nonspecific abnormal finding of lung field: Secondary | ICD-10-CM | POA: Diagnosis not present

## 2021-01-19 DIAGNOSIS — E039 Hypothyroidism, unspecified: Secondary | ICD-10-CM | POA: Diagnosis not present

## 2021-01-19 DIAGNOSIS — Z8701 Personal history of pneumonia (recurrent): Secondary | ICD-10-CM | POA: Diagnosis not present

## 2021-01-19 DIAGNOSIS — Z01812 Encounter for preprocedural laboratory examination: Secondary | ICD-10-CM | POA: Diagnosis not present

## 2021-01-19 DIAGNOSIS — I10 Essential (primary) hypertension: Secondary | ICD-10-CM | POA: Diagnosis not present

## 2021-01-22 DIAGNOSIS — Z20822 Contact with and (suspected) exposure to covid-19: Secondary | ICD-10-CM | POA: Diagnosis not present

## 2021-01-22 DIAGNOSIS — Z01812 Encounter for preprocedural laboratory examination: Secondary | ICD-10-CM | POA: Diagnosis not present

## 2021-01-25 DIAGNOSIS — Z7409 Other reduced mobility: Secondary | ICD-10-CM | POA: Diagnosis not present

## 2021-01-25 DIAGNOSIS — Z96641 Presence of right artificial hip joint: Secondary | ICD-10-CM | POA: Diagnosis not present

## 2021-01-25 DIAGNOSIS — Z981 Arthrodesis status: Secondary | ICD-10-CM | POA: Diagnosis not present

## 2021-01-25 DIAGNOSIS — Z87891 Personal history of nicotine dependence: Secondary | ICD-10-CM | POA: Diagnosis not present

## 2021-01-25 DIAGNOSIS — J449 Chronic obstructive pulmonary disease, unspecified: Secondary | ICD-10-CM | POA: Diagnosis not present

## 2021-01-25 DIAGNOSIS — Z8616 Personal history of COVID-19: Secondary | ICD-10-CM | POA: Diagnosis not present

## 2021-01-25 DIAGNOSIS — E039 Hypothyroidism, unspecified: Secondary | ICD-10-CM | POA: Diagnosis not present

## 2021-01-25 DIAGNOSIS — R531 Weakness: Secondary | ICD-10-CM | POA: Diagnosis not present

## 2021-01-25 DIAGNOSIS — Z8701 Personal history of pneumonia (recurrent): Secondary | ICD-10-CM | POA: Diagnosis not present

## 2021-01-25 DIAGNOSIS — J439 Emphysema, unspecified: Secondary | ICD-10-CM | POA: Diagnosis not present

## 2021-01-25 DIAGNOSIS — M1611 Unilateral primary osteoarthritis, right hip: Secondary | ICD-10-CM | POA: Diagnosis not present

## 2021-01-25 DIAGNOSIS — Z471 Aftercare following joint replacement surgery: Secondary | ICD-10-CM | POA: Diagnosis not present

## 2021-01-25 DIAGNOSIS — Z9181 History of falling: Secondary | ICD-10-CM | POA: Diagnosis not present

## 2021-01-25 DIAGNOSIS — K219 Gastro-esophageal reflux disease without esophagitis: Secondary | ICD-10-CM | POA: Diagnosis not present

## 2021-01-25 DIAGNOSIS — J45909 Unspecified asthma, uncomplicated: Secondary | ICD-10-CM | POA: Diagnosis not present

## 2021-01-26 ENCOUNTER — Other Ambulatory Visit: Payer: Self-pay | Admitting: Osteopathic Medicine

## 2021-01-26 DIAGNOSIS — J439 Emphysema, unspecified: Secondary | ICD-10-CM | POA: Diagnosis not present

## 2021-01-26 DIAGNOSIS — E039 Hypothyroidism, unspecified: Secondary | ICD-10-CM | POA: Diagnosis not present

## 2021-01-26 DIAGNOSIS — J45909 Unspecified asthma, uncomplicated: Secondary | ICD-10-CM | POA: Diagnosis not present

## 2021-01-26 DIAGNOSIS — Z9181 History of falling: Secondary | ICD-10-CM | POA: Diagnosis not present

## 2021-01-26 DIAGNOSIS — M1611 Unilateral primary osteoarthritis, right hip: Secondary | ICD-10-CM | POA: Diagnosis not present

## 2021-01-26 DIAGNOSIS — Z7409 Other reduced mobility: Secondary | ICD-10-CM | POA: Diagnosis not present

## 2021-01-26 DIAGNOSIS — R531 Weakness: Secondary | ICD-10-CM | POA: Diagnosis not present

## 2021-01-26 DIAGNOSIS — Z8616 Personal history of COVID-19: Secondary | ICD-10-CM | POA: Diagnosis not present

## 2021-01-26 DIAGNOSIS — Z981 Arthrodesis status: Secondary | ICD-10-CM | POA: Diagnosis not present

## 2021-01-27 DIAGNOSIS — J45909 Unspecified asthma, uncomplicated: Secondary | ICD-10-CM | POA: Diagnosis not present

## 2021-01-27 DIAGNOSIS — J479 Bronchiectasis, uncomplicated: Secondary | ICD-10-CM | POA: Diagnosis not present

## 2021-01-27 DIAGNOSIS — H269 Unspecified cataract: Secondary | ICD-10-CM | POA: Diagnosis not present

## 2021-01-27 DIAGNOSIS — R32 Unspecified urinary incontinence: Secondary | ICD-10-CM | POA: Diagnosis not present

## 2021-01-27 DIAGNOSIS — J439 Emphysema, unspecified: Secondary | ICD-10-CM | POA: Diagnosis not present

## 2021-01-27 DIAGNOSIS — I10 Essential (primary) hypertension: Secondary | ICD-10-CM | POA: Diagnosis not present

## 2021-01-27 DIAGNOSIS — E079 Disorder of thyroid, unspecified: Secondary | ICD-10-CM | POA: Diagnosis not present

## 2021-01-27 DIAGNOSIS — Z471 Aftercare following joint replacement surgery: Secondary | ICD-10-CM | POA: Diagnosis not present

## 2021-01-27 DIAGNOSIS — F32A Depression, unspecified: Secondary | ICD-10-CM | POA: Diagnosis not present

## 2021-01-29 DIAGNOSIS — H269 Unspecified cataract: Secondary | ICD-10-CM | POA: Diagnosis not present

## 2021-01-29 DIAGNOSIS — I10 Essential (primary) hypertension: Secondary | ICD-10-CM | POA: Diagnosis not present

## 2021-01-29 DIAGNOSIS — F32A Depression, unspecified: Secondary | ICD-10-CM | POA: Diagnosis not present

## 2021-01-29 DIAGNOSIS — J479 Bronchiectasis, uncomplicated: Secondary | ICD-10-CM | POA: Diagnosis not present

## 2021-01-29 DIAGNOSIS — R32 Unspecified urinary incontinence: Secondary | ICD-10-CM | POA: Diagnosis not present

## 2021-01-29 DIAGNOSIS — J45909 Unspecified asthma, uncomplicated: Secondary | ICD-10-CM | POA: Diagnosis not present

## 2021-01-29 DIAGNOSIS — E079 Disorder of thyroid, unspecified: Secondary | ICD-10-CM | POA: Diagnosis not present

## 2021-01-29 DIAGNOSIS — Z471 Aftercare following joint replacement surgery: Secondary | ICD-10-CM | POA: Diagnosis not present

## 2021-01-29 DIAGNOSIS — J439 Emphysema, unspecified: Secondary | ICD-10-CM | POA: Diagnosis not present

## 2021-02-01 DIAGNOSIS — E079 Disorder of thyroid, unspecified: Secondary | ICD-10-CM | POA: Diagnosis not present

## 2021-02-01 DIAGNOSIS — I10 Essential (primary) hypertension: Secondary | ICD-10-CM | POA: Diagnosis not present

## 2021-02-01 DIAGNOSIS — J45909 Unspecified asthma, uncomplicated: Secondary | ICD-10-CM | POA: Diagnosis not present

## 2021-02-01 DIAGNOSIS — J439 Emphysema, unspecified: Secondary | ICD-10-CM | POA: Diagnosis not present

## 2021-02-01 DIAGNOSIS — J479 Bronchiectasis, uncomplicated: Secondary | ICD-10-CM | POA: Diagnosis not present

## 2021-02-01 DIAGNOSIS — R32 Unspecified urinary incontinence: Secondary | ICD-10-CM | POA: Diagnosis not present

## 2021-02-01 DIAGNOSIS — F32A Depression, unspecified: Secondary | ICD-10-CM | POA: Diagnosis not present

## 2021-02-01 DIAGNOSIS — Z471 Aftercare following joint replacement surgery: Secondary | ICD-10-CM | POA: Diagnosis not present

## 2021-02-01 DIAGNOSIS — H269 Unspecified cataract: Secondary | ICD-10-CM | POA: Diagnosis not present

## 2021-02-03 ENCOUNTER — Other Ambulatory Visit: Payer: Self-pay | Admitting: Osteopathic Medicine

## 2021-02-03 DIAGNOSIS — F32A Depression, unspecified: Secondary | ICD-10-CM | POA: Diagnosis not present

## 2021-02-03 DIAGNOSIS — I10 Essential (primary) hypertension: Secondary | ICD-10-CM | POA: Diagnosis not present

## 2021-02-03 DIAGNOSIS — J439 Emphysema, unspecified: Secondary | ICD-10-CM | POA: Diagnosis not present

## 2021-02-03 DIAGNOSIS — J45909 Unspecified asthma, uncomplicated: Secondary | ICD-10-CM | POA: Diagnosis not present

## 2021-02-03 DIAGNOSIS — R32 Unspecified urinary incontinence: Secondary | ICD-10-CM | POA: Diagnosis not present

## 2021-02-03 DIAGNOSIS — E079 Disorder of thyroid, unspecified: Secondary | ICD-10-CM | POA: Diagnosis not present

## 2021-02-03 DIAGNOSIS — H269 Unspecified cataract: Secondary | ICD-10-CM | POA: Diagnosis not present

## 2021-02-03 DIAGNOSIS — Z471 Aftercare following joint replacement surgery: Secondary | ICD-10-CM | POA: Diagnosis not present

## 2021-02-03 DIAGNOSIS — J479 Bronchiectasis, uncomplicated: Secondary | ICD-10-CM | POA: Diagnosis not present

## 2021-02-05 DIAGNOSIS — J439 Emphysema, unspecified: Secondary | ICD-10-CM | POA: Diagnosis not present

## 2021-02-05 DIAGNOSIS — J45909 Unspecified asthma, uncomplicated: Secondary | ICD-10-CM | POA: Diagnosis not present

## 2021-02-05 DIAGNOSIS — J479 Bronchiectasis, uncomplicated: Secondary | ICD-10-CM | POA: Diagnosis not present

## 2021-02-05 DIAGNOSIS — I10 Essential (primary) hypertension: Secondary | ICD-10-CM | POA: Diagnosis not present

## 2021-02-05 DIAGNOSIS — H269 Unspecified cataract: Secondary | ICD-10-CM | POA: Diagnosis not present

## 2021-02-05 DIAGNOSIS — E079 Disorder of thyroid, unspecified: Secondary | ICD-10-CM | POA: Diagnosis not present

## 2021-02-05 DIAGNOSIS — F32A Depression, unspecified: Secondary | ICD-10-CM | POA: Diagnosis not present

## 2021-02-05 DIAGNOSIS — R32 Unspecified urinary incontinence: Secondary | ICD-10-CM | POA: Diagnosis not present

## 2021-02-05 DIAGNOSIS — Z471 Aftercare following joint replacement surgery: Secondary | ICD-10-CM | POA: Diagnosis not present

## 2021-02-13 DIAGNOSIS — J441 Chronic obstructive pulmonary disease with (acute) exacerbation: Secondary | ICD-10-CM | POA: Diagnosis not present

## 2021-02-15 DIAGNOSIS — M25551 Pain in right hip: Secondary | ICD-10-CM | POA: Diagnosis not present

## 2021-02-15 DIAGNOSIS — G8929 Other chronic pain: Secondary | ICD-10-CM | POA: Diagnosis not present

## 2021-02-15 DIAGNOSIS — Z79899 Other long term (current) drug therapy: Secondary | ICD-10-CM | POA: Diagnosis not present

## 2021-02-15 DIAGNOSIS — M545 Low back pain, unspecified: Secondary | ICD-10-CM | POA: Diagnosis not present

## 2021-02-15 DIAGNOSIS — G894 Chronic pain syndrome: Secondary | ICD-10-CM | POA: Diagnosis not present

## 2021-02-15 DIAGNOSIS — M4316 Spondylolisthesis, lumbar region: Secondary | ICD-10-CM | POA: Diagnosis not present

## 2021-02-15 DIAGNOSIS — M25552 Pain in left hip: Secondary | ICD-10-CM | POA: Diagnosis not present

## 2021-02-17 DIAGNOSIS — J479 Bronchiectasis, uncomplicated: Secondary | ICD-10-CM | POA: Diagnosis not present

## 2021-02-17 DIAGNOSIS — J439 Emphysema, unspecified: Secondary | ICD-10-CM | POA: Diagnosis not present

## 2021-02-17 DIAGNOSIS — Z471 Aftercare following joint replacement surgery: Secondary | ICD-10-CM | POA: Diagnosis not present

## 2021-02-17 DIAGNOSIS — R32 Unspecified urinary incontinence: Secondary | ICD-10-CM | POA: Diagnosis not present

## 2021-02-17 DIAGNOSIS — I10 Essential (primary) hypertension: Secondary | ICD-10-CM | POA: Diagnosis not present

## 2021-02-17 DIAGNOSIS — F32A Depression, unspecified: Secondary | ICD-10-CM | POA: Diagnosis not present

## 2021-02-17 DIAGNOSIS — H269 Unspecified cataract: Secondary | ICD-10-CM | POA: Diagnosis not present

## 2021-02-17 DIAGNOSIS — J45909 Unspecified asthma, uncomplicated: Secondary | ICD-10-CM | POA: Diagnosis not present

## 2021-02-17 DIAGNOSIS — E079 Disorder of thyroid, unspecified: Secondary | ICD-10-CM | POA: Diagnosis not present

## 2021-03-10 DIAGNOSIS — I10 Essential (primary) hypertension: Secondary | ICD-10-CM | POA: Diagnosis not present

## 2021-03-10 DIAGNOSIS — J45909 Unspecified asthma, uncomplicated: Secondary | ICD-10-CM | POA: Diagnosis not present

## 2021-03-10 DIAGNOSIS — A310 Pulmonary mycobacterial infection: Secondary | ICD-10-CM | POA: Diagnosis not present

## 2021-03-10 DIAGNOSIS — R9389 Abnormal findings on diagnostic imaging of other specified body structures: Secondary | ICD-10-CM | POA: Diagnosis not present

## 2021-03-15 DIAGNOSIS — F1190 Opioid use, unspecified, uncomplicated: Secondary | ICD-10-CM | POA: Diagnosis not present

## 2021-03-15 DIAGNOSIS — M25552 Pain in left hip: Secondary | ICD-10-CM | POA: Diagnosis not present

## 2021-03-15 DIAGNOSIS — G8929 Other chronic pain: Secondary | ICD-10-CM | POA: Diagnosis not present

## 2021-03-15 DIAGNOSIS — G894 Chronic pain syndrome: Secondary | ICD-10-CM | POA: Diagnosis not present

## 2021-03-15 DIAGNOSIS — M545 Low back pain, unspecified: Secondary | ICD-10-CM | POA: Diagnosis not present

## 2021-03-15 DIAGNOSIS — M25551 Pain in right hip: Secondary | ICD-10-CM | POA: Diagnosis not present

## 2021-03-15 DIAGNOSIS — M4316 Spondylolisthesis, lumbar region: Secondary | ICD-10-CM | POA: Diagnosis not present

## 2021-03-16 DIAGNOSIS — J441 Chronic obstructive pulmonary disease with (acute) exacerbation: Secondary | ICD-10-CM | POA: Diagnosis not present

## 2021-03-17 DIAGNOSIS — A312 Disseminated mycobacterium avium-intracellulare complex (DMAC): Secondary | ICD-10-CM | POA: Diagnosis not present

## 2021-03-17 DIAGNOSIS — Z79899 Other long term (current) drug therapy: Secondary | ICD-10-CM | POA: Diagnosis not present

## 2021-04-04 ENCOUNTER — Other Ambulatory Visit: Payer: Self-pay | Admitting: Osteopathic Medicine

## 2021-04-08 ENCOUNTER — Other Ambulatory Visit: Payer: Self-pay | Admitting: Osteopathic Medicine

## 2021-04-08 ENCOUNTER — Telehealth: Payer: Self-pay

## 2021-04-08 NOTE — Telephone Encounter (Signed)
Insurance will likely require a visit / physical exam before covering imaging for diagnositc mammogram and ultrasound for mass workup - needs visit

## 2021-04-08 NOTE — Telephone Encounter (Signed)
Pt called and stated she found a lump in her breast. Pt said she attempted to schedule a mammogram but [facility] told [facility] she needed to talk to PCP first. Pt wants to know if she has to come in for an appointment (last seen 11/03/2020 for hypertension) or if a referral can be made. Thanks.

## 2021-04-13 NOTE — Telephone Encounter (Signed)
Please schedule appointment.

## 2021-04-13 NOTE — Telephone Encounter (Signed)
Pt made an appt 04/26/2021 at 3:20. tvt

## 2021-04-15 DIAGNOSIS — J441 Chronic obstructive pulmonary disease with (acute) exacerbation: Secondary | ICD-10-CM | POA: Diagnosis not present

## 2021-04-19 ENCOUNTER — Other Ambulatory Visit: Payer: Self-pay | Admitting: Osteopathic Medicine

## 2021-04-26 ENCOUNTER — Other Ambulatory Visit: Payer: Self-pay

## 2021-04-26 ENCOUNTER — Ambulatory Visit (INDEPENDENT_AMBULATORY_CARE_PROVIDER_SITE_OTHER): Payer: Medicare HMO | Admitting: Osteopathic Medicine

## 2021-04-26 ENCOUNTER — Encounter: Payer: Self-pay | Admitting: Osteopathic Medicine

## 2021-04-26 VITALS — BP 123/77 | HR 75 | Temp 97.8°F | Wt 118.0 lb

## 2021-04-26 DIAGNOSIS — G8929 Other chronic pain: Secondary | ICD-10-CM

## 2021-04-26 DIAGNOSIS — M1611 Unilateral primary osteoarthritis, right hip: Secondary | ICD-10-CM | POA: Diagnosis not present

## 2021-04-26 DIAGNOSIS — M19011 Primary osteoarthritis, right shoulder: Secondary | ICD-10-CM

## 2021-04-26 DIAGNOSIS — N6320 Unspecified lump in the left breast, unspecified quadrant: Secondary | ICD-10-CM | POA: Diagnosis not present

## 2021-04-26 NOTE — Progress Notes (Signed)
Margaret [name] is a 75 y.o. female who presents to  [facility]  today, 04/26/21, seeking care for the following:  . L BREAST LUMP noted a few months ago, not grown, feels like a bee bee, at about 4:00 position adjacent to nipple, small puckering in of ckin, this is immediately adjacent to old surgical scar (breast implant then later removal)  . Requests referral for PT  . Occasional tingling pain in inner cheek, reports dentist saw nothing of concern, no pain now, no rash, no vision change, no headache, no jaw pain       ASSESSMENT & PLAN with other pertinent findings:  The primary encounter diagnosis was Lump of left breast. Diagnoses of Primary osteoarthritis of right hip, Primary osteoarthritis of right shoulder, and Other chronic pain were also pertinent to this visit.   --> diag mammo/US though I suspect scar tissue the new onset and skin puckering is concerning and need to eval  --> PT referral prdered --> monitor tingling sensation, possible facial nerve entrapment, no red flags at this point for temporal arteritis or shingles  There are no Patient Instructions on file for this visit.  Orders Placed This Encounter  Procedures  . MM Digital Diagnostic Bilat  . US BREAST COMPLETE UNI RIGHT INC AXILLA  . US BREAST COMPLETE UNI LEFT INC AXILLA  . Ambulatory referral to Physical Therapy    No orders of the defined types were placed in this encounter.    See below for relevant physical exam findings  See below for recent lab and imaging results reviewed  Medications, allergies, PMH, PSH, SocH, FamH reviewed below    Follow-up instructions: Return for MEDICARE WELLNESS W/ RN SOMETIME IN NEXT 6 MOS .                                        Exam:  BP 123/77 (BP Location: Left Arm, Patient Position: Sitting, Cuff Size: Small)   Pulse 75   Temp 97.8 F (36.6 C) (Oral)   Wt 118 lb  (53.5 kg)   BMI 22.30 kg/m   Constitutional: VS see above. General Appearance: alert, well-developed, well-nourished, NAD  Neck: No masses, trachea midline.   Respiratory: Normal respiratory effort.   Neurological: Normal balance/coordination. No tremor.  Skin: warm, dry, intact.   Psychiatric: Normal judgment/insight. Normal mood and affect. Oriented x3.   BREAST: No rashes, normal fibrous breast tissue overall, scars arround bilateral nipples and under bilateral breasts c/w implant.removal, no tenderness, nipple without discharge, normal axilla, on L breast small firm mass w/ overlying skin dimpling about 4:00 position adjacent to nipple    Current Meds  Medication Sig  . albuterol (PROVENTIL HFA;VENTOLIN HFA) 108 (90 Base) MCG/ACT inhaler Inhale 1-2 puffs into the lungs every 4 (four) hours as needed for wheezing.  . AMBULATORY NON FORMULARY MEDICATION Medication Name: home BP monitor, upper arm  . amLODipine (NORVASC) 10 MG tablet TAKE 1 TABLET BY MOUTH EVERY DAY  . FLUoxetine (PROZAC) 20 MG capsule Take 1 capsule (20 mg total) by mouth daily. In combination with Phentermine  . Fluticasone Furoate (ARNUITY ELLIPTA) 100 MCG/ACT AEPB INHALE ONE PUFF INTO THE LUNGS DAILY FOR 30 DAYS. RINSE MOUTH AFTER EACH USE.  [name] HYDROcodone-acetaminophen (NORCO) 10-325 MG tablet Take 1 tablet by mouth every 6 (six) hours as needed.   [name] ipratropium (ATROVENT) 0.06 %  nasal spray PLACE 2 SPRAYS INTO BOTH NOSTRILS 4 (FOUR) TIMES DAILY. PRN SINUS CONGESTION  . levothyroxine (SYNTHROID) 50 MCG tablet TAKE 1 TABLET BY MOUTH EVERY DAY  . losartan (COZAAR) 25 MG tablet TAKE 1 TABLET BY MOUTH EVERY DAY  . omeprazole (PRILOSEC) 40 MG capsule TAKE 1 CAPSULE BY MOUTH EVERY DAY    Allergies  Allergen Reactions  . Bacitracin-Neomycin-Polymyxin Anaphylaxis and Hives  . Neomycin-Bacitracin Zn-Polymyx Anaphylaxis  . Clarithromycin Rash and Other (See Comments)    THRUSH    . Ofloxacin Hives, Nausea And  Vomiting, Rash and Other (See Comments)    GI    . Other Other (See Comments)    ANTIBIOTIC OINT - UNSURE OF NAME  Hives, GI upset   . Raloxifene Other (See Comments)    Back pain/ PT NOT SURE ABOUT THIS REACTION     . Sulfa Antibiotics Other (See Comments)    GI UPSET   . Tetracycline Other (See Comments) and Nausea And Vomiting  . Tetracyclines & Related Nausea And Vomiting    Other reaction(s): Other SEVERE INDIGESTION   . Neosporin [Neomycin-Polymyxin-Gramicidin] Hives    Patient Active Problem List   Diagnosis Date Noted  . Mycobacterium avium complex ([place]) 10/07/2020  . COVID-19 07/16/2020  . Mild persistent asthma with acute exacerbation 02/01/2019  . Renal insufficiency 02/01/2019  . Acute cystitis 04/23/2018  . H/O total knee replacement, left 10/23/2017  . DJD of right shoulder 07/07/2017  . Iron deficiency anemia 05/31/2017  . Pulmonary nodules 12/26/2016  . Shoulder pain, right 12/25/2016  . Other chronic pain 11/03/2016  . Thyroid nodule 03/30/2016  . Solitary pulmonary nodule 03/30/2016  . Strain of gluteus medius 10/07/2015  . Depression with anxiety 09/29/2015  . Hip pain, acute 08/31/2015  . Vitamin D insufficiency 04/08/2015  . Elevated vitamin B12 level 04/08/2015  . Hirsutism 01/28/2015  . Osteoarthritis of right knee 11/04/2014  . Primary osteoarthritis of right hip 08/21/2014  . Hair loss 08/21/2014  . Hypothyroidism 07/24/2014  . Vasomotor flushing 03/05/2014  . Right-sided low back pain post T10-S1 fusion 10/14/2013  . Coffee ground emesis 03/06/2013  . Disorder of bursae and tendons in shoulder region 03/01/2013  . Diverticulosis of colon without hemorrhage 02/22/2013  . Hyperglycemia 02/01/2013  . Essential hypertension, benign 01/31/2013  . Osteoporosis 01/31/2013  . HSV infection 01/31/2013    Family History  Problem Relation Age of Onset  . Stroke Other   . Emphysema Mother 52       Death cause was emphysema.  . Alcohol  abuse Father   . COPD Sister   . Alcohol abuse Brother 61  . Cirrhosis Brother   . Cancer Maternal Grandmother 14       Stomach  . Stroke Maternal Grandfather 36    Social History   Tobacco Use  Smoking Status Former Smoker  . Packs/day: 1.50  . Years: 40.00  . Pack years: 60.00  . Quit date: 02/01/2008  . Years since quitting: 13.2  Smokeless Tobacco Never Used    Past Surgical History:  Procedure Laterality Date  . ABDOMINAL HYSTERECTOMY    . BREAST SURGERY    . CESAREAN SECTION    . JOINT REPLACEMENT     LT KNEE  . TOTAL KNEE ARTHROPLASTY  2006    Immunization History  Administered Date(s) Administered  . Influenza Split 09/30/2020  . Influenza, High Dose Seasonal PF 09/03/2017, 10/17/2018, 09/03/2019, 09/30/2020  . Influenza,inj,Quad PF,6+ Mos 10/14/2013, 11/04/2014, 08/18/2015, 11/03/2016  .  Influenza-Unspecified 10/14/2013, 11/04/2014, 08/18/2015, 11/03/2016, 09/03/2017, 09/30/2020  . PFIZER(Purple Top)SARS-COV-2 Vaccination 02/25/2020, 03/17/2020  . Pneumococcal Conjugate-13 07/08/2016  . Pneumococcal Polysaccharide-23 09/10/2012  . Pneumococcal-Unspecified 09/04/2012  . Tdap 10/31/2011, 05/26/2018    No results found for this or any previous visit (from the past 2160 hour(s)).  No results found.     All questions at time of visit were answered - patient instructed to contact office with any additional concerns or updates. ER/RTC precautions were reviewed with the patient as applicable.   Please note: manual typing as well as voice recognition software may have been used to produce this document - typos may escape review. Please contact Dr. Sheppard Coil for any needed clarifications.

## 2021-04-29 DIAGNOSIS — M545 Low back pain, unspecified: Secondary | ICD-10-CM | POA: Diagnosis not present

## 2021-05-05 ENCOUNTER — Other Ambulatory Visit: Payer: Self-pay | Admitting: Osteopathic Medicine

## 2021-05-05 DIAGNOSIS — I10 Essential (primary) hypertension: Secondary | ICD-10-CM

## 2021-05-11 DIAGNOSIS — M545 Low back pain, unspecified: Secondary | ICD-10-CM | POA: Diagnosis not present

## 2021-05-13 DIAGNOSIS — M545 Low back pain, unspecified: Secondary | ICD-10-CM | POA: Diagnosis not present

## 2021-05-14 DIAGNOSIS — M25552 Pain in left hip: Secondary | ICD-10-CM | POA: Diagnosis not present

## 2021-05-14 DIAGNOSIS — M545 Low back pain, unspecified: Secondary | ICD-10-CM | POA: Diagnosis not present

## 2021-05-14 DIAGNOSIS — M25551 Pain in right hip: Secondary | ICD-10-CM | POA: Diagnosis not present

## 2021-05-14 DIAGNOSIS — M4316 Spondylolisthesis, lumbar region: Secondary | ICD-10-CM | POA: Diagnosis not present

## 2021-05-16 DIAGNOSIS — J441 Chronic obstructive pulmonary disease with (acute) exacerbation: Secondary | ICD-10-CM | POA: Diagnosis not present

## 2021-05-19 ENCOUNTER — Encounter: Payer: Self-pay | Admitting: Osteopathic Medicine

## 2021-05-19 DIAGNOSIS — R921 Mammographic calcification found on diagnostic imaging of breast: Secondary | ICD-10-CM | POA: Diagnosis not present

## 2021-05-19 DIAGNOSIS — R922 Inconclusive mammogram: Secondary | ICD-10-CM | POA: Diagnosis not present

## 2021-05-19 DIAGNOSIS — N6320 Unspecified lump in the left breast, unspecified quadrant: Secondary | ICD-10-CM | POA: Diagnosis not present

## 2021-05-20 ENCOUNTER — Telehealth: Payer: Self-pay | Admitting: Osteopathic Medicine

## 2021-05-20 NOTE — Chronic Care Management (AMB) (Signed)
  Chronic Care Management   Outreach Note  05/20/2021 Name: Margaret [name] MRN: [id number] DOB: August 01, 1946  Referred by: Emeterio Reeve, DO Reason for referral : No chief complaint on file.   An unsuccessful telephone outreach was attempted today. The patient was referred to the pharmacist for assistance with care management and care coordination.   Follow Up Plan:   Lauretta Grill Upstream Scheduler

## 2021-05-25 DIAGNOSIS — M545 Low back pain, unspecified: Secondary | ICD-10-CM | POA: Diagnosis not present

## 2021-06-04 ENCOUNTER — Telehealth: Payer: Self-pay | Admitting: Osteopathic Medicine

## 2021-06-04 NOTE — Progress Notes (Signed)
  Chronic Care Management   Outreach Note  06/04/2021 Name: Riti Rollyson MRN: [id number] DOB: [birth year]  Referred by: Emeterio Reeve, DO Reason for referral : No chief complaint on file.   A second unsuccessful telephone outreach was attempted today. The patient was referred to pharmacist for assistance with care management and care coordination.  Follow Up Plan:   Lauretta Grill Upstream Scheduler

## 2021-06-15 ENCOUNTER — Telehealth: Payer: Self-pay | Admitting: Osteopathic Medicine

## 2021-06-15 DIAGNOSIS — J441 Chronic obstructive pulmonary disease with (acute) exacerbation: Secondary | ICD-10-CM | POA: Diagnosis not present

## 2021-06-15 NOTE — Progress Notes (Signed)
  Chronic Care Management   Outreach Note  06/15/2021 Name: Margaret [name] MRN: [id number] DOB: 1946-06-04  Referred by: Emeterio Reeve, DO Reason for referral : No chief complaint on file.   Third unsuccessful telephone outreach was attempted today. The patient was referred to the pharmacist for assistance with care management and care coordination.   Follow Up Plan:   Lauretta Grill Upstream Scheduler

## 2021-07-15 DIAGNOSIS — Z5181 Encounter for therapeutic drug level monitoring: Secondary | ICD-10-CM | POA: Diagnosis not present

## 2021-07-15 DIAGNOSIS — Z79899 Other long term (current) drug therapy: Secondary | ICD-10-CM | POA: Diagnosis not present

## 2021-07-15 DIAGNOSIS — M4316 Spondylolisthesis, lumbar region: Secondary | ICD-10-CM | POA: Diagnosis not present

## 2021-07-15 DIAGNOSIS — F1190 Opioid use, unspecified, uncomplicated: Secondary | ICD-10-CM | POA: Diagnosis not present

## 2021-07-15 DIAGNOSIS — M545 Low back pain, unspecified: Secondary | ICD-10-CM | POA: Diagnosis not present

## 2021-07-15 DIAGNOSIS — M25551 Pain in right hip: Secondary | ICD-10-CM | POA: Diagnosis not present

## 2021-07-15 DIAGNOSIS — M25552 Pain in left hip: Secondary | ICD-10-CM | POA: Diagnosis not present

## 2021-07-16 DIAGNOSIS — J441 Chronic obstructive pulmonary disease with (acute) exacerbation: Secondary | ICD-10-CM | POA: Diagnosis not present

## 2021-07-20 DIAGNOSIS — K219 Gastro-esophageal reflux disease without esophagitis: Secondary | ICD-10-CM | POA: Diagnosis not present

## 2021-07-20 DIAGNOSIS — R490 Dysphonia: Secondary | ICD-10-CM | POA: Diagnosis not present

## 2021-07-20 DIAGNOSIS — M5459 Other low back pain: Secondary | ICD-10-CM | POA: Diagnosis not present

## 2021-07-20 DIAGNOSIS — M25551 Pain in right hip: Secondary | ICD-10-CM | POA: Diagnosis not present

## 2021-07-20 DIAGNOSIS — M5414 Radiculopathy, thoracic region: Secondary | ICD-10-CM | POA: Diagnosis not present

## 2021-07-26 ENCOUNTER — Other Ambulatory Visit: Payer: Self-pay | Admitting: Osteopathic Medicine

## 2021-08-16 DIAGNOSIS — J441 Chronic obstructive pulmonary disease with (acute) exacerbation: Secondary | ICD-10-CM | POA: Diagnosis not present

## 2021-08-21 ENCOUNTER — Telehealth: Payer: Self-pay | Admitting: Osteopathic Medicine

## 2021-08-21 NOTE — Telephone Encounter (Signed)
Left message for patient to call back and schedule Medicare Annual Wellness Visit (AWV) either virtually or in office.   Last AWV ;02/06/17 please schedule at anytime with health coach

## 2021-08-25 DIAGNOSIS — R9389 Abnormal findings on diagnostic imaging of other specified body structures: Secondary | ICD-10-CM | POA: Diagnosis not present

## 2021-08-25 DIAGNOSIS — J45909 Unspecified asthma, uncomplicated: Secondary | ICD-10-CM | POA: Diagnosis not present

## 2021-08-25 DIAGNOSIS — A310 Pulmonary mycobacterial infection: Secondary | ICD-10-CM | POA: Diagnosis not present

## 2021-08-25 DIAGNOSIS — I10 Essential (primary) hypertension: Secondary | ICD-10-CM | POA: Diagnosis not present

## 2021-08-27 ENCOUNTER — Other Ambulatory Visit: Payer: Self-pay | Admitting: Osteopathic Medicine

## 2021-09-08 DIAGNOSIS — K449 Diaphragmatic hernia without obstruction or gangrene: Secondary | ICD-10-CM | POA: Diagnosis not present

## 2021-09-08 DIAGNOSIS — K219 Gastro-esophageal reflux disease without esophagitis: Secondary | ICD-10-CM | POA: Diagnosis not present

## 2021-09-08 DIAGNOSIS — R490 Dysphonia: Secondary | ICD-10-CM | POA: Diagnosis not present

## 2021-09-09 DIAGNOSIS — F1190 Opioid use, unspecified, uncomplicated: Secondary | ICD-10-CM | POA: Diagnosis not present

## 2021-09-09 DIAGNOSIS — M25552 Pain in left hip: Secondary | ICD-10-CM | POA: Diagnosis not present

## 2021-09-09 DIAGNOSIS — M545 Low back pain, unspecified: Secondary | ICD-10-CM | POA: Diagnosis not present

## 2021-09-09 DIAGNOSIS — M25551 Pain in right hip: Secondary | ICD-10-CM | POA: Diagnosis not present

## 2021-09-09 DIAGNOSIS — M4316 Spondylolisthesis, lumbar region: Secondary | ICD-10-CM | POA: Diagnosis not present

## 2021-09-22 ENCOUNTER — Other Ambulatory Visit: Payer: Self-pay

## 2021-09-22 ENCOUNTER — Ambulatory Visit: Payer: Medicare HMO | Admitting: Physical Therapy

## 2021-09-22 ENCOUNTER — Encounter: Payer: Self-pay | Admitting: Physical Therapy

## 2021-09-22 DIAGNOSIS — M6281 Muscle weakness (generalized): Secondary | ICD-10-CM | POA: Diagnosis not present

## 2021-09-22 DIAGNOSIS — M546 Pain in thoracic spine: Secondary | ICD-10-CM

## 2021-09-22 DIAGNOSIS — R29898 Other symptoms and signs involving the musculoskeletal system: Secondary | ICD-10-CM

## 2021-09-22 NOTE — Therapy (Signed)
[place] [address] [place], [name], [address] Phone: [phone]   Fax:  [phone]  Physical Therapy Evaluation  Patient Details  Name: Masiel Gentzler MRN: [id number] Date of Birth: 09-22-1946 Referring Provider (PT): Chevis Pretty   Encounter Date: 09/22/2021   PT End of Session - 09/22/21 1100     Visit Number 1    Number of Visits 12    Date for PT Re-Evaluation 11/03/21    Authorization Type [employer]    Authorization - Visit Number 1    Progress Note Due on Visit 10    PT Start Time 1020    PT Stop Time 1100    PT Time Calculation (min) 40 min    Activity Tolerance Patient tolerated treatment well    Behavior During Therapy [facility] for tasks assessed/performed             Past Medical History:  Diagnosis Date   Anemia    Anxiety    Arthritis    Asthma    Cancer ([place])    COPD (chronic obstructive pulmonary disease) ([place])    COVID-19 07/16/2020   Emphysema of lung ([place])    GERD (gastroesophageal reflux disease)    Hypertension    Mycobacterium avium complex ([place]) 10/07/2020   Osteoporosis    Thyroid disease     Past Surgical History:  Procedure Laterality Date   ABDOMINAL HYSTERECTOMY     BREAST SURGERY     CESAREAN SECTION     JOINT REPLACEMENT     LT KNEE   TOTAL KNEE ARTHROPLASTY  2006    There were no vitals filed for this visit.    Subjective Assessment - 09/22/21 1026     Subjective Pt had Rt hip replacement 02/2021 and states she has had difficulty walking since then. Her pain is in the Lt side of her back during ambulation. Pt states she has had 2 previous back surgeries as well. Pain increases with houseworks, lifting groceries. Pain decreases with massage and meds.    Pertinent History L1-L3 fusion, Rt hip replacement 02/2021    Limitations Standing;Walking    How long can you stand comfortably? 10 minutes    How long can you walk comfortably? 5 minutes    Diagnostic  tests x rays performed at [facility], pt states they show arthritis    Patient Stated Goals decrease pain    Currently in Pain? No/denies                [facility] PT Assessment - 09/22/21 0001       Assessment   Medical Diagnosis Pain in Rt hip    Referring Provider (PT) Chevis Pretty    Onset Date/Surgical Date 02/10/21    Hand Dominance Right    Next MD Visit PRN    Prior Therapy for back and hip pain      Precautions   Precautions None      Restrictions   Weight Bearing Restrictions No      Balance Screen   Has the patient fallen in the past 6 months No    Has the patient had a decrease in activity level because of a fear of falling?  No    Is the patient reluctant to leave their home because of a fear of falling?  No      Prior Function   Level of Independence Independent      Observation/Other Assessments   Focus on Therapeutic Outcomes (FOTO)  26      Posture/Postural Control   Posture Comments Lt iliac crest higher than Rt in standing, in supine: ASIS rotated anterior on Rt      ROM / Strength   AROM / PROM / Strength AROM;Strength      AROM   AROM Assessment Site Lumbar    Lumbar Flexion limited 50%    Lumbar Extension limited 75%    Lumbar - Right Side Bend limited 50% - pain    Lumbar - Left Side Bend limited 50%    Lumbar - Right Rotation limited 50% - pain    Lumbar - Left Rotation limited 50%      Strength   Strength Assessment Site Hip    Right/Left Hip Right;Left    Right Hip Flexion 3+/5    Right Hip Extension 4-/5    Right Hip ABduction 3/5    Left Hip Flexion 4+/5    Left Hip Extension 4-/5    Left Hip ABduction 4-/5      Flexibility   Soft Tissue Assessment /Muscle Length yes    Hamstrings decreased bilat    Quadratus Lumborum decreased bilat      Palpation   Palpation comment increased mm spasticity thoracic and lumbar paraspinals                        Objective measurements completed on examination: See above  findings.       [place] Adult PT Treatment/Exercise - 09/22/21 0001       Exercises   Exercises Lumbar      Lumbar Exercises: Stretches   Other Lumbar Stretch Exercise QL stretch in doorway x 30 sec bilat      Lumbar Exercises: Supine   Ab Set 5 reps;5 seconds    Bridge 10 reps    Straight Leg Raise 10 reps      Lumbar Exercises: Sidelying   Hip Abduction 10 reps                     PT Education - 09/22/21 1055     Education Details PT POC and goals, HEP    Person(s) Educated Patient    Methods Explanation;Demonstration;Handout    Comprehension Returned demonstration;Verbalized understanding                 PT Long Term Goals - 09/22/21 1152       PT LONG TERM GOAL #1   Title Pt will be independent in HEP    Time 6    Period Weeks    Status New    Target Date 11/03/21      PT LONG TERM GOAL #2   Title Pt will improve FOTO to >= 59 to demo improved functional mobility    Time 6    Period Weeks    Status New    Target Date 11/03/21      PT LONG TERM GOAL #3   Title Pt will improve bilat LE strength to 4+/5 to improve standing and walking tolerance    Time 6    Period Weeks    Status New    Target Date 11/03/21      PT LONG TERM GOAL #4   Title Pt will tolerate walking x 20 minutes with pain <= 2/10    Time 6    Period Weeks    Status New    Target Date 11/03/21  Plan - 09/22/21 1100     Clinical Impression Statement Pt is a 75 y/o female referred for Rt hip pain. Pt presents with Left sided back pain, impaired posture, decreased strength and ROM, decreased functional mobility and activity tolerance. Pt will benefit from skilled PT to address deficits and improve functional mobility    Personal Factors and Comorbidities Comorbidity 3+;Time since onset of injury/illness/exacerbation;Past/Current Experience    Examination-Activity Limitations Stand;Locomotion Level;Lift;Carry    Examination-Participation  Restrictions Community Activity;Yard Work;Cleaning    Stability/Clinical Decision Making Evolving/Moderate complexity    Clinical Decision Making Moderate    Rehab Potential Good    PT Frequency 2x / week    PT Duration 6 weeks    PT Treatment/Interventions Aquatic Therapy;Cryotherapy;Moist Heat;Electrical Stimulation;Stair training;Gait training;Therapeutic exercise;Balance training;Neuromuscular re-education;Therapeutic activities;Patient/family education;Manual techniques;Dry needling;Taping    PT Next Visit Plan assess HEP, add hamstring stretch, core strengthening    PT Home Exercise Plan [email or web address]    Consulted and Agree with Plan of Care Patient             Patient will benefit from skilled therapeutic intervention in order to improve the following deficits and impairments:  Pain, Postural dysfunction, Impaired flexibility, Decreased activity tolerance, Decreased range of motion, Increased muscle spasms, Difficulty walking, Decreased endurance, Decreased strength  Visit Diagnosis: Pain in thoracic spine - Plan: PT plan of care cert/re-cert  Muscle weakness (generalized) - Plan: PT plan of care cert/re-cert  Other symptoms and signs involving the musculoskeletal system - Plan: PT plan of care cert/re-cert     Problem List Patient Active Problem List   Diagnosis Date Noted   Mycobacterium avium complex ([place]) 10/07/2020   COVID-19 07/16/2020   Mild persistent asthma with acute exacerbation 02/01/2019   Renal insufficiency 02/01/2019   Acute cystitis 04/23/2018   H/O total knee replacement, left 10/23/2017   DJD of right shoulder 07/07/2017   Iron deficiency anemia 05/31/2017   Pulmonary nodules 12/26/2016   Shoulder pain, right 12/25/2016   Other chronic pain 11/03/2016   Thyroid nodule 03/30/2016   Solitary pulmonary nodule 03/30/2016   Strain of gluteus medius 10/07/2015   Depression with anxiety 09/29/2015   Hip pain, acute 08/31/2015   Vitamin D insufficiency  04/08/2015   Elevated vitamin B12 level 04/08/2015   Hirsutism 01/28/2015   Osteoarthritis of right knee 11/04/2014   Primary osteoarthritis of right hip 08/21/2014   Hair loss 08/21/2014   Hypothyroidism 07/24/2014   Vasomotor flushing 03/05/2014   Right-sided low back pain post T10-S1 fusion 10/14/2013   Coffee ground emesis 03/06/2013   Disorder of bursae and tendons in shoulder region 03/01/2013   Diverticulosis of colon without hemorrhage 02/22/2013   Hyperglycemia 02/01/2013   Essential hypertension, benign 01/31/2013   Osteoporosis 01/31/2013   HSV infection 01/31/2013    Shantay Sonn, PT 09/22/2021, 11:58 AM  [facility] [place] [address] [place], [name], [address] Phone: [phone]   Fax:  [phone]  Name: Senai Ramnath MRN: [id number] Date of Birth: 09/30/1946

## 2021-09-22 NOTE — Patient Instructions (Signed)
Access Code: [place] URL: [email or web address] Date: 09/22/2021 Prepared by: Isabelle Course  Exercises Standing Quadratus Lumborum Stretch with Doorway - 1 x daily - 7 x weekly - 3 sets - 1 reps - 20-30 sec hold Supine Transversus Abdominis Bracing - Hands on Stomach - 1 x daily - 7 x weekly - 1 sets - 10 reps - 5 seconds hold Supine Bridge - 1 x daily - 7 x weekly - 2 sets - 10 reps Sidelying Hip Abduction - 1 x daily - 7 x weekly - 2 sets - 10 reps Active Straight Leg Raise with Quad Set - 1 x daily - 7 x weekly - 2 sets - 10 reps

## 2021-09-27 ENCOUNTER — Ambulatory Visit: Payer: Medicare HMO | Admitting: Physical Therapy

## 2021-09-27 ENCOUNTER — Other Ambulatory Visit: Payer: Self-pay

## 2021-09-27 DIAGNOSIS — M546 Pain in thoracic spine: Secondary | ICD-10-CM

## 2021-09-27 DIAGNOSIS — M6281 Muscle weakness (generalized): Secondary | ICD-10-CM | POA: Diagnosis not present

## 2021-09-27 DIAGNOSIS — R29898 Other symptoms and signs involving the musculoskeletal system: Secondary | ICD-10-CM | POA: Diagnosis not present

## 2021-09-27 NOTE — Therapy (Signed)
[place], [name], [address] Phone: [phone]   Fax:  [phone]  Physical Therapy Treatment  Patient Details  Name: Margaret [name] MRN: [id number] Date of Birth: 27-Sep-1946 Referring Provider (PT): Chevis Pretty   Encounter Date: 09/27/2021   PT End of Session - 09/27/21 0859     Visit Number 2    Number of Visits 12    Date for PT Re-Evaluation 11/03/21    Authorization Type [employer] Medicare    Authorization - Visit Number 2    Progress Note Due on Visit 10    PT Start Time [phone]    PT Stop Time 0930    PT Time Calculation (min) 38 min    Activity Tolerance Patient tolerated treatment well    Behavior During Therapy [facility] for tasks assessed/performed             Past Medical History:  Diagnosis Date   Anemia    Anxiety    Arthritis    Asthma    Cancer ([place])    COPD (chronic obstructive pulmonary disease) ([place])    COVID-19 07/16/2020   Emphysema of lung ([place])    GERD (gastroesophageal reflux disease)    Hypertension    Mycobacterium avium complex ([place]) 10/07/2020   Osteoporosis    Thyroid disease     Past Surgical History:  Procedure Laterality Date   ABDOMINAL HYSTERECTOMY     BREAST SURGERY     CESAREAN SECTION     JOINT REPLACEMENT     LT KNEE   TOTAL KNEE ARTHROPLASTY  2006    There were no vitals filed for this visit.   Subjective Assessment - 09/27/21 0857     Subjective Pt reports she woke up with 10/10 pain in Lt midback. She rolled on foam roller and took a pain pill.    Pertinent History L1-L3 fusion, Rt hip replacement 02/2021    Diagnostic tests x rays performed at [facility], pt states they show arthritis    Currently in Pain? Yes    Pain Score 3     Pain Location Back    Pain Orientation Left    Pain Descriptors / Indicators Aching    Aggravating Factors  walking, bending    Pain Relieving Factors pain meds, massage                [facility] PT  Assessment - 09/27/21 0001       Assessment   Medical Diagnosis Pain in Rt hip    Referring Provider (PT) Chevis Pretty    Onset Date/Surgical Date 02/10/21    Hand Dominance Right    Next MD Visit PRN    Prior Therapy for back and hip pain                [facility] Adult PT Treatment/Exercise - 09/27/21 0001       Lumbar Exercises: Stretches   Passive Hamstring Stretch Right;2 reps;20 seconds    Hip Flexor Stretch Right;2 reps;Left;1 rep;20 seconds   seated with leg off of chair   Other Lumbar Stretch Exercise QL stretch in doorway x 10 sec bilat x 3, cues for form.      Lumbar Exercises: Aerobic   Nustep L4: arms/legs x 5 min      Lumbar Exercises: Standing   Other Standing Lumbar Exercises side stepping at counter, L/R x 10 ft x 2 reps. Backwards walking with focus on Rt hip ext, UE support on  counter x 10 ft x 4    Other Standing Lumbar Exercises Rt SLS with UE support on counter x 10 sec x 2 reps      Lumbar Exercises: Supine   Bridge 10 reps    Straight Leg Raise 10 reps   RLE (last 5 with assist from therapist)   Straight Leg Raises Limitations cues to keep knee straight      Lumbar Exercises: Sidelying   Hip Abduction Right;10 reps   2 sets, (5 reps with AAROM from therapist) - unable to complete full range.                 PT Long Term Goals - 09/22/21 1152       PT LONG TERM GOAL #1   Title Pt will be independent in HEP    Time 6    Period Weeks    Status New    Target Date 11/03/21      PT LONG TERM GOAL #2   Title Pt will improve FOTO to >= 59 to demo improved functional mobility    Time 6    Period Weeks    Status New    Target Date 11/03/21      PT LONG TERM GOAL #3   Title Pt will improve bilat LE strength to 4+/5 to improve standing and walking tolerance    Time 6    Period Weeks    Status New    Target Date 11/03/21      PT LONG TERM GOAL #4   Title Pt will tolerate walking x 20 minutes with pain <= 2/10    Time 6    Period  Weeks    Status New    Target Date 11/03/21                   Plan - 09/27/21 5465     Clinical Impression Statement Pt presents with weakness in Rt hip; limited ability to complete SLR and sidelying hip abdct.  Noticeable trendelenburg with gait.  She tolerated standing exercises well, without increase in pain.  Encouraged pt to utilize TENS and MHP at home for pain control in Lt lumbar.  Goals are ongoing.    Personal Factors and Comorbidities Comorbidity 3+;Time since onset of injury/illness/exacerbation;Past/Current Experience    Examination-Activity Limitations Stand;Locomotion Level;Lift;Carry    Examination-Participation Restrictions Community Activity;Yard Work;Cleaning    Stability/Clinical Decision Making Evolving/Moderate complexity    Rehab Potential Good    PT Frequency 2x / week    PT Duration 6 weeks    PT Treatment/Interventions Aquatic Therapy;Cryotherapy;Moist Heat;Electrical Stimulation;Stair training;Gait training;Therapeutic exercise;Balance training;Neuromuscular re-education;Therapeutic activities;Patient/family education;Manual techniques;Dry needling;Taping    PT Next Visit Plan assess HEP, add hamstring stretch, core strengthening    PT Home Exercise Plan [email or web address]    Consulted and Agree with Plan of Care Patient             Patient will benefit from skilled therapeutic intervention in order to improve the following deficits and impairments:  Pain, Postural dysfunction, Impaired flexibility, Decreased activity tolerance, Decreased range of motion, Increased muscle spasms, Difficulty walking, Decreased endurance, Decreased strength  Visit Diagnosis: Pain in thoracic spine  Muscle weakness (generalized)  Other symptoms and signs involving the musculoskeletal system     Problem List Patient Active Problem List   Diagnosis Date Noted   Mycobacterium avium complex ([place]) 10/07/2020   COVID-19 07/16/2020   Mild persistent asthma with acute  exacerbation 02/01/2019   Renal insufficiency  02/01/2019   Acute cystitis 04/23/2018   H/O total knee replacement, left 10/23/2017   DJD of right shoulder 07/07/2017   Iron deficiency anemia 05/31/2017   Pulmonary nodules 12/26/2016   Shoulder pain, right 12/25/2016   Other chronic pain 11/03/2016   Thyroid nodule 03/30/2016   Solitary pulmonary nodule 03/30/2016   Strain of gluteus medius 10/07/2015   Depression with anxiety 09/29/2015   Hip pain, acute 08/31/2015   Vitamin D insufficiency 04/08/2015   Elevated vitamin B12 level 04/08/2015   Hirsutism 01/28/2015   Osteoarthritis of right knee 11/04/2014   Primary osteoarthritis of right hip 08/21/2014   Hair loss 08/21/2014   Hypothyroidism 07/24/2014   Vasomotor flushing 03/05/2014   Right-sided low back pain post T10-S1 fusion 10/14/2013   Coffee ground emesis 03/06/2013   Disorder of bursae and tendons in shoulder region 03/01/2013   Diverticulosis of colon without hemorrhage 02/22/2013   Hyperglycemia 02/01/2013   Essential hypertension, benign 01/31/2013   Osteoporosis 01/31/2013   HSV infection 01/31/2013   Kerin Perna, PTA 09/27/21 12:06 PM  [place], [name], [address] Phone: [phone]   Fax:  [phone]  Name: Diania Co MRN: [id number] Date of Birth: [birth year]

## 2021-09-27 NOTE — Patient Instructions (Addendum)
  Access Code: [place] URL: [email or web address] Date: 09/27/2021 Prepared by: [place]  Exercises Standing Quadratus Lumborum Stretch with Doorway - 1 x daily - 7 x weekly - 3 sets - 1 reps - 20-30 sec hold Supine Transversus Abdominis Bracing - Hands on Stomach - 1 x daily - 7 x weekly - 1 sets - 10 reps - 5 seconds hold Supine Bridge - 1 x daily - 7 x weekly - 2 sets - 10 reps Sidelying Hip Abduction - 1 x daily - 7 x weekly - 2 sets - 10 reps Active Straight Leg Raise with Quad Set - 1 x daily - 7 x weekly - 2 sets - 10 reps Side Stepping with Counter Support - 1 x daily - 7 x weekly - 1 sets - 3 reps Backward Walking with Counter Support - 1 x daily - 7 x weekly - 1 sets - 3 reps Standing Single Leg Stance with Counter Support - 2 x daily - 7 x weekly - 1 sets - 2 reps - 10 hold

## 2021-09-29 ENCOUNTER — Encounter: Payer: Medicare HMO | Admitting: Physical Therapy

## 2021-09-30 ENCOUNTER — Ambulatory Visit: Payer: Medicare HMO | Admitting: Physical Therapy

## 2021-09-30 ENCOUNTER — Other Ambulatory Visit: Payer: Self-pay

## 2021-09-30 DIAGNOSIS — M6281 Muscle weakness (generalized): Secondary | ICD-10-CM | POA: Diagnosis not present

## 2021-09-30 DIAGNOSIS — R29898 Other symptoms and signs involving the musculoskeletal system: Secondary | ICD-10-CM | POA: Diagnosis not present

## 2021-09-30 DIAGNOSIS — M546 Pain in thoracic spine: Secondary | ICD-10-CM | POA: Diagnosis not present

## 2021-09-30 NOTE — Therapy (Signed)
[place], [name], [address] Phone: [phone]   Fax:  [phone]  Physical Therapy Treatment  Patient Details  Name: Margaret [name] MRN: [id number] Date of Birth: 1946-01-30 Referring Provider (PT): Chevis Pretty   Encounter Date: 09/30/2021   PT End of Session - 09/30/21 1709     Visit Number 3    Number of Visits 12    Date for PT Re-Evaluation 11/03/21    Authorization Type [employer] Medicare    Authorization - Visit Number 3    Progress Note Due on Visit 10    PT Start Time 4259    PT Stop Time 1700    PT Time Calculation (min) 44 min    Activity Tolerance Patient tolerated treatment well    Behavior During Therapy [facility] for tasks assessed/performed             Past Medical History:  Diagnosis Date   Anemia    Anxiety    Arthritis    Asthma    Cancer ([place])    COPD (chronic obstructive pulmonary disease) ([place])    COVID-19 07/16/2020   Emphysema of lung ([place])    GERD (gastroesophageal reflux disease)    Hypertension    Mycobacterium avium complex ([place]) 10/07/2020   Osteoporosis    Thyroid disease     Past Surgical History:  Procedure Laterality Date   ABDOMINAL HYSTERECTOMY     BREAST SURGERY     CESAREAN SECTION     JOINT REPLACEMENT     LT KNEE   TOTAL KNEE ARTHROPLASTY  2006    There were no vitals filed for this visit.   Subjective Assessment - 09/30/21 1620     Subjective Pt reports that the door stretch has irritated her back.    Pertinent History L1-L3 fusion, Rt hip replacement 02/2021    Currently in Pain? Yes    Pain Score 3     Pain Location Back    Pain Orientation Left    Pain Descriptors / Indicators Sore    Aggravating Factors  walking, bending    Pain Relieving Factors pain meds, massage                [facility] PT Assessment - 09/30/21 0001       Assessment   Medical Diagnosis Pain in Rt hip    Referring Provider (PT) Chevis Pretty     Onset Date/Surgical Date 02/10/21    Hand Dominance Right    Next MD Visit PRN    Prior Therapy for back and hip pain               [facility] Adult PT Treatment/Exercise - 09/30/21 0001       Ambulation/Gait   Ambulation/Gait Yes    Ambulation Distance (Feet) 320 Feet   2 trials   Assistive device Straight cane    Gait Pattern Step-through pattern    Pre-Gait Activities retro gait with UE support on counter and then cane use in Lt hand.  Cues for increased step length backwards with LLE,-this is when trendelenburg is most noticable.    Gait Comments cues for cane sequence and placement, cues for Increased DF for Rt heel strike, and tactile cues for improved R reciprocal arm swing.      Lumbar Exercises: Stretches   Passive Hamstring Stretch Right;2 reps;20 seconds    Lower Trunk Rotation 5 reps   feet narrow and wide with arms in T  Hip Flexor Stretch Right;2 reps;Left;1 rep;20 seconds   seated with leg off of chair     Lumbar Exercises: Aerobic   Nustep L5.5 arms/legs x 5.5 min for warm up.      Lumbar Exercises: Standing   Other Standing Lumbar Exercises side stepping at counter, L/R x 15 ft x 2 reps, wiht increased step height.      Lumbar Exercises: Seated   Sit to Stand 10 reps   no UE support, staggered stance, RLE back.  2 sets     Lumbar Exercises: Supine   Single Leg Bridge 10 reps   each leg     Lumbar Exercises: Sidelying   Hip Abduction Right;10 reps   limited available range     Knee/Hip Exercises: Standing   Heel Raises Both;1 set;10 reps    Lateral Step Up Right;1 set;10 reps;Hand Hold: 1;Step Height: 6"    Stairs reciprocal pattern up/down 4 and 6" steps with bilat rail x 10, no difficulty.                          PT Long Term Goals - 09/22/21 1152       PT LONG TERM GOAL #1   Title Pt will be independent in HEP    Time 6    Period Weeks    Status New    Target Date 11/03/21      PT LONG TERM GOAL #2   Title Pt will improve  FOTO to >= 59 to demo improved functional mobility    Time 6    Period Weeks    Status New    Target Date 11/03/21      PT LONG TERM GOAL #3   Title Pt will improve bilat LE strength to 4+/5 to improve standing and walking tolerance    Time 6    Period Weeks    Status New    Target Date 11/03/21      PT LONG TERM GOAL #4   Title Pt will tolerate walking x 20 minutes with pain <= 2/10    Time 6    Period Weeks    Status New    Target Date 11/03/21                   Plan - 09/30/21 1756     Clinical Impression Statement Pt's trendelenburg is most noticable during forward/ retro gait without LUE assist of cane or counter.  Improved gait pattern with use of SPC.  Limited strength noted with sidelying hip abdct.  Pt tolerated all exercises well.  Will use TENS at home. Progressing towards goals.    Personal Factors and Comorbidities Comorbidity 3+;Time since onset of injury/illness/exacerbation;Past/Current Experience    Examination-Activity Limitations Stand;Locomotion Level;Lift;Carry    Examination-Participation Restrictions Community Activity;Yard Work;Cleaning    Stability/Clinical Decision Making Evolving/Moderate complexity    Rehab Potential Good    PT Frequency 2x / week    PT Duration 6 weeks    PT Treatment/Interventions Aquatic Therapy;Cryotherapy;Moist Heat;Electrical Stimulation;Stair training;Gait training;Therapeutic exercise;Balance training;Neuromuscular re-education;Therapeutic activities;Patient/family education;Manual techniques;Dry needling;Taping    PT Next Visit Plan assess HEP, add hamstring stretch, core strengthening    PT Home Exercise Plan [email or web address]    Consulted and Agree with Plan of Care Patient             Patient will benefit from skilled therapeutic intervention in order to improve the following deficits and impairments:  Pain, Postural dysfunction,  Impaired flexibility, Decreased activity tolerance, Decreased range of motion, Increased  muscle spasms, Difficulty walking, Decreased endurance, Decreased strength  Visit Diagnosis: Pain in thoracic spine  Muscle weakness (generalized)  Other symptoms and signs involving the musculoskeletal system     Problem List Patient Active Problem List   Diagnosis Date Noted   Mycobacterium avium complex ([place]) 10/07/2020   COVID-19 07/16/2020   Mild persistent asthma with acute exacerbation 02/01/2019   Renal insufficiency 02/01/2019   Acute cystitis 04/23/2018   H/O total knee replacement, left 10/23/2017   DJD of right shoulder 07/07/2017   Iron deficiency anemia 05/31/2017   Pulmonary nodules 12/26/2016   Shoulder pain, right 12/25/2016   Other chronic pain 11/03/2016   Thyroid nodule 03/30/2016   Solitary pulmonary nodule 03/30/2016   Strain of gluteus medius 10/07/2015   Depression with anxiety 09/29/2015   Hip pain, acute 08/31/2015   Vitamin D insufficiency 04/08/2015   Elevated vitamin B12 level 04/08/2015   Hirsutism 01/28/2015   Osteoarthritis of right knee 11/04/2014   Primary osteoarthritis of right hip 08/21/2014   Hair loss 08/21/2014   Hypothyroidism 07/24/2014   Vasomotor flushing 03/05/2014   Right-sided low back pain post T10-S1 fusion 10/14/2013   Coffee ground emesis 03/06/2013   Disorder of bursae and tendons in shoulder region 03/01/2013   Diverticulosis of colon without hemorrhage 02/22/2013   Hyperglycemia 02/01/2013   Essential hypertension, benign 01/31/2013   Osteoporosis 01/31/2013   HSV infection 01/31/2013   Kerin Perna, PTA 09/30/21 [facility] [place] [address] [place], [name], [address] Phone: [phone]   Fax:  [phone]  Name: Margaret [name] MRN: [id number] Date of Birth: Jan 26, 1946

## 2021-09-30 NOTE — Patient Instructions (Signed)
Access Code: [place] URL: [email or web address] Date: 09/30/2021 Prepared by: [place]  Exercises Supine Transversus Abdominis Bracing - Hands on Stomach - 1 x daily - 7 x weekly - 1 sets - 10 reps - 5 seconds hold Supine Bridge - 1 x daily - 7 x weekly - 2 sets - 10 reps Sidelying Hip Abduction - 1 x daily - 7 x weekly - 2 sets - 10 reps Active Straight Leg Raise with Quad Set - 1 x daily - 7 x weekly - 2 sets - 10 reps Side Stepping with Counter Support - 1 x daily - 7 x weekly - 1 sets - 3 reps Backward Walking with Counter Support - 1 x daily - 7 x weekly - 1 sets - 3 reps Standing Single Leg Stance with Counter Support - 2 x daily - 7 x weekly - 1 sets - 2 reps - 10 hold Sit to Stand with Hands on Knees - 1 x daily - 7 x weekly - 1 sets - 10 reps

## 2021-10-04 ENCOUNTER — Encounter: Payer: Medicare HMO | Admitting: Physical Therapy

## 2021-10-06 ENCOUNTER — Encounter: Payer: Medicare HMO | Admitting: Physical Therapy

## 2021-10-11 ENCOUNTER — Other Ambulatory Visit: Payer: Self-pay

## 2021-10-11 ENCOUNTER — Ambulatory Visit: Payer: Medicare HMO | Admitting: Physical Therapy

## 2021-10-11 DIAGNOSIS — R29898 Other symptoms and signs involving the musculoskeletal system: Secondary | ICD-10-CM | POA: Diagnosis not present

## 2021-10-11 DIAGNOSIS — M6281 Muscle weakness (generalized): Secondary | ICD-10-CM

## 2021-10-11 DIAGNOSIS — M546 Pain in thoracic spine: Secondary | ICD-10-CM

## 2021-10-11 NOTE — Therapy (Signed)
[place], [name], [address] Phone: [phone]   Fax:  [phone]  Physical Therapy Treatment  Patient Details  Name: Margaret [name] MRN: [id number] Date of Birth: 01/16/1946 Referring Provider (PT): Chevis Pretty   Encounter Date: 10/11/2021   PT End of Session - 10/11/21 1025     Visit Number 4    Number of Visits 12    Date for PT Re-Evaluation 11/03/21    Authorization Type [employer] Medicare    Authorization - Visit Number 4    Progress Note Due on Visit 10    PT Start Time 1018    PT Stop Time 1058    PT Time Calculation (min) 40 min    Activity Tolerance Patient tolerated treatment well    Behavior During Therapy [facility] for tasks assessed/performed             Past Medical History:  Diagnosis Date   Anemia    Anxiety    Arthritis    Asthma    Cancer ([place])    COPD (chronic obstructive pulmonary disease) ([place])    COVID-19 07/16/2020   Emphysema of lung ([place])    GERD (gastroesophageal reflux disease)    Hypertension    Mycobacterium avium complex ([place]) 10/07/2020   Osteoporosis    Thyroid disease     Past Surgical History:  Procedure Laterality Date   ABDOMINAL HYSTERECTOMY     BREAST SURGERY     CESAREAN SECTION     JOINT REPLACEMENT     LT KNEE   TOTAL KNEE ARTHROPLASTY  2006    There were no vitals filed for this visit.   Subjective Assessment - 10/11/21 1025     Subjective Pt reports that she went to beach and hadn't done much exercise besides side stepping at counter.  She is tickled by her improvement; overall less pain.  she is noticing clicking in Rt hip after she has been sitting a while, "it feels like the bone".  She is using cane if her RLE is tired, like in the afternoons.    Pertinent History L1-L3 fusion, Rt hip replacement 02/2021    Currently in Pain? No/denies    Pain Score 0-No pain                [facility] PT Assessment - 10/11/21 0001        Assessment   Medical Diagnosis Pain in Rt hip    Referring Provider (PT) Chevis Pretty    Onset Date/Surgical Date 02/10/21    Hand Dominance Right    Next MD Visit PRN    Prior Therapy for back and hip pain      Strength   Right Hip Flexion 4+/5    Right Hip Extension 4+/5    Right Hip ABduction 3-/5              [facility] Adult PT Treatment/Exercise - 10/11/21 0001       Ambulation/Gait   Ambulation Distance (Feet) 160 Feet    Assistive device Straight cane    Gait Pattern Step-through pattern    Gait Comments cues for reciprocal arm swing, and even weight shift.      Lumbar Exercises: Aerobic   Nustep L5 legs x 5 min for warm up.      Lumbar Exercises: Standing   Other Standing Lumbar Exercises side stepping at counter, L/R x 10 ft x 3 reps, with increased step height.- cues to slow speed  for increased Rt stance time.   retro gait 10 ft x 6 reps with tactile cues for TKE on Rt in stance      Lumbar Exercises: Supine   Single Leg Bridge 15 reps   each leg. fig 4 position   Straight Leg Raise 10 reps      Lumbar Exercises: Sidelying   Hip Abduction Right;10 reps;5 reps   10 without assist (limited range), 5 with assist with increased ROM.     Lumbar Exercises: Prone   Straight Leg Raise 10 reps   each leg, alternating                         PT Long Term Goals - 10/11/21 1027       PT LONG TERM GOAL #1   Title Pt will be independent in HEP    Time 6    Period Weeks    Status On-going      PT LONG TERM GOAL #2   Title Pt will improve FOTO to >= 59 to demo improved functional mobility    Time 6    Period Weeks    Status On-going      PT LONG TERM GOAL #3   Title Pt will improve bilat LE strength to 4+/5 to improve standing and walking tolerance    Time 6    Period Weeks    Status On-going      PT LONG TERM GOAL #4   Title Pt will tolerate walking x 20 minutes with pain <= 2/10    Time 6    Period Weeks    Status On-going                    Plan - 10/11/21 1032     Clinical Impression Statement Cues given for pt to slow the speed of side stepping at counter to allow for lower single leg stance.  continued weakness in Rt hip abductors, noticeable with SLS and retro gait. She reported Lt LBP up to 6/10 with Rt stance during retro gait.   Some noticable clicking in posterior Rt hip during stance to toe off.  RLE strength improving.  Progressing towards goals.    Personal Factors and Comorbidities Comorbidity 3+;Time since onset of injury/illness/exacerbation;Past/Current Experience    Examination-Activity Limitations Stand;Locomotion Level;Lift;Carry    Examination-Participation Restrictions Community Activity;Yard Work;Cleaning    Stability/Clinical Decision Making Evolving/Moderate complexity    Rehab Potential Good    PT Frequency 2x / week    PT Duration 6 weeks    PT Treatment/Interventions Aquatic Therapy;Cryotherapy;Moist Heat;Electrical Stimulation;Stair training;Gait training;Therapeutic exercise;Balance training;Neuromuscular re-education;Therapeutic activities;Patient/family education;Manual techniques;Dry needling;Taping    PT Next Visit Plan assess HEP - progress as able. , add hamstring stretch, core strengthening    PT Home Exercise Plan [email or web address]    Consulted and Agree with Plan of Care Patient             Patient will benefit from skilled therapeutic intervention in order to improve the following deficits and impairments:  Pain, Postural dysfunction, Impaired flexibility, Decreased activity tolerance, Decreased range of motion, Increased muscle spasms, Difficulty walking, Decreased endurance, Decreased strength  Visit Diagnosis: Pain in thoracic spine  Muscle weakness (generalized)  Other symptoms and signs involving the musculoskeletal system     Problem List Patient Active Problem List   Diagnosis Date Noted   Mycobacterium avium complex ([place]) 10/07/2020   COVID-19 07/16/2020    Mild persistent asthma  with acute exacerbation 02/01/2019   Renal insufficiency 02/01/2019   Acute cystitis 04/23/2018   H/O total knee replacement, left 10/23/2017   DJD of right shoulder 07/07/2017   Iron deficiency anemia 05/31/2017   Pulmonary nodules 12/26/2016   Shoulder pain, right 12/25/2016   Other chronic pain 11/03/2016   Thyroid nodule 03/30/2016   Solitary pulmonary nodule 03/30/2016   Strain of gluteus medius 10/07/2015   Depression with anxiety 09/29/2015   Hip pain, acute 08/31/2015   Vitamin D insufficiency 04/08/2015   Elevated vitamin B12 level 04/08/2015   Hirsutism 01/28/2015   Osteoarthritis of right knee 11/04/2014   Primary osteoarthritis of right hip 08/21/2014   Hair loss 08/21/2014   Hypothyroidism 07/24/2014   Vasomotor flushing 03/05/2014   Right-sided low back pain post T10-S1 fusion 10/14/2013   Coffee ground emesis 03/06/2013   Disorder of bursae and tendons in shoulder region 03/01/2013   Diverticulosis of colon without hemorrhage 02/22/2013   Hyperglycemia 02/01/2013   Essential hypertension, benign 01/31/2013   Osteoporosis 01/31/2013   HSV infection 01/31/2013   Kerin Perna, PTA 10/11/21 12:07 PM   [place], [name], [address] Phone: [phone]   Fax:  [phone]  Name: Amberly Livas MRN: [id number] Date of Birth: [birth year]

## 2021-10-12 ENCOUNTER — Ambulatory Visit: Payer: Medicare HMO | Admitting: Physical Therapy

## 2021-10-12 DIAGNOSIS — R29898 Other symptoms and signs involving the musculoskeletal system: Secondary | ICD-10-CM | POA: Diagnosis not present

## 2021-10-12 DIAGNOSIS — M546 Pain in thoracic spine: Secondary | ICD-10-CM | POA: Diagnosis not present

## 2021-10-12 DIAGNOSIS — M6281 Muscle weakness (generalized): Secondary | ICD-10-CM

## 2021-10-12 NOTE — Patient Instructions (Signed)
Access Code: [place] URL: [email or web address] Date: 10/12/2021 Prepared by: [place]  Exercises Sidelying Hip Abduction - 1 x daily - 7 x weekly - 2 sets - 10 reps Active Straight Leg Raise with Quad Set - 1 x daily - 7 x weekly - 2 sets - 10 reps Side Stepping with Counter Support - 1 x daily - 7 x weekly - 1 sets - 3 reps Backward Walking with Counter Support - 1 x daily - 7 x weekly - 1 sets - 3 reps Standing Single Leg Stance with Counter Support - 2 x daily - 7 x weekly - 1 sets - 2 reps - 10 hold Figure 4 Bridge - 1 x daily - 7 x weekly - 2 sets - 10 reps Quadruped Alternating Leg Extensions - 1 x daily - 7 x weekly - 2 sets - 10 reps Quadruped [place] - 1 x daily - 7 x weekly - 2 sets - 10 reps

## 2021-10-12 NOTE — Therapy (Signed)
[place], [name], [address] Phone: [phone]   Fax:  [phone]  Physical Therapy Treatment  Patient Details  Name: Margaret [name] MRN: [id number] Date of Birth: 1946/01/28 Referring Provider (PT): Chevis Pretty   Encounter Date: 10/12/2021   PT End of Session - 10/12/21 0843     Visit Number 5    Number of Visits 12    Date for PT Re-Evaluation 11/03/21    Authorization Type [employer]    Authorization - Visit Number 5    Progress Note Due on Visit 10    PT Start Time 0802    PT Stop Time 0845    PT Time Calculation (min) 43 min    Activity Tolerance Patient tolerated treatment well    Behavior During Therapy [facility] for tasks assessed/performed             Past Medical History:  Diagnosis Date   Anemia    Anxiety    Arthritis    Asthma    Cancer ([place])    COPD (chronic obstructive pulmonary disease) ([place])    COVID-19 07/16/2020   Emphysema of lung ([place])    GERD (gastroesophageal reflux disease)    Hypertension    Mycobacterium avium complex ([place]) 10/07/2020   Osteoporosis    Thyroid disease     Past Surgical History:  Procedure Laterality Date   ABDOMINAL HYSTERECTOMY     BREAST SURGERY     CESAREAN SECTION     JOINT REPLACEMENT     LT KNEE   TOTAL KNEE ARTHROPLASTY  2006    There were no vitals filed for this visit.   Subjective Assessment - 10/12/21 0809     Subjective Pt states she had a "hard work out" at [employer] yesterday. She states she is not sore and no increased pain    Patient Stated Goals decrease pain    Currently in Pain? No/denies                               [facility] Adult PT Treatment/Exercise - 10/12/21 0001       Lumbar Exercises: Aerobic   Nustep L5 legs x 5 min for warm up.      Lumbar Exercises: Standing   Other Standing Lumbar Exercises side stepping with Red TB with UE support on counter 10' x 4    Other Standing  Lumbar Exercises backward walking at counter 4 x 10'      Lumbar Exercises: Supine   Single Leg Bridge 15 reps   each leg. fig 4 position   Straight Leg Raise 15 reps    Straight Leg Raises Limitations bilat LE    Other Supine Lumbar Exercises bridge with HS curl with LEs on green physioball      Lumbar Exercises: Sidelying   Clam 15 reps    Clam Limitations then Rt LE lifts in clam position x 10    Hip Abduction Right;15 reps      Lumbar Exercises: Quadruped   Straight Leg Raise 5 reps    Straight Leg Raises Limitations 2 x 5 bilat with cues for neutral pelvis    Other Quadruped Lumbar Exercises fire hydrant 2 x 5 bilat with cues for neutral alignment      Knee/Hip Exercises: Standing   Other Standing Knee Exercises hip 3 way with towel under foot x 10 bilat with 1 UE support focus  on stance control                     PT Education - 10/12/21 0843     Education Details updated HEP    Person(s) Educated Patient    Methods Explanation;Demonstration;Handout    Comprehension Returned demonstration;Verbalized understanding                 PT Long Term Goals - 10/11/21 1027       PT LONG TERM GOAL #1   Title Pt will be independent in HEP    Time 6    Period Weeks    Status On-going      PT LONG TERM GOAL #2   Title Pt will improve FOTO to >= 59 to demo improved functional mobility    Time 6    Period Weeks    Status On-going      PT LONG TERM GOAL #3   Title Pt will improve bilat LE strength to 4+/5 to improve standing and walking tolerance    Time 6    Period Weeks    Status On-going      PT LONG TERM GOAL #4   Title Pt will tolerate walking x 20 minutes with pain <= 2/10    Time 6    Period Weeks    Status On-going                   Plan - 10/12/21 0845     Clinical Impression Statement Pt with good tolerance to progression of exercises. continues to require cuing for neutral posture to prevent trendelenberg of Rt hip    PT Next  Visit Plan add HS stretch, core strength    PT Home Exercise Plan [id number]    Consulted and Agree with Plan of Care Patient             Patient will benefit from skilled therapeutic intervention in order to improve the following deficits and impairments:     Visit Diagnosis: Pain in thoracic spine  Other symptoms and signs involving the musculoskeletal system  Muscle weakness (generalized)     Problem List Patient Active Problem List   Diagnosis Date Noted   Mycobacterium avium complex ([place]) 10/07/2020   COVID-19 07/16/2020   Mild persistent asthma with acute exacerbation 02/01/2019   Renal insufficiency 02/01/2019   Acute cystitis 04/23/2018   H/O total knee replacement, left 10/23/2017   DJD of right shoulder 07/07/2017   Iron deficiency anemia 05/31/2017   Pulmonary nodules 12/26/2016   Shoulder pain, right 12/25/2016   Other chronic pain 11/03/2016   Thyroid nodule 03/30/2016   Solitary pulmonary nodule 03/30/2016   Strain of gluteus medius 10/07/2015   Depression with anxiety 09/29/2015   Hip pain, acute 08/31/2015   Vitamin D insufficiency 04/08/2015   Elevated vitamin B12 level 04/08/2015   Hirsutism 01/28/2015   Osteoarthritis of right knee 11/04/2014   Primary osteoarthritis of right hip 08/21/2014   Hair loss 08/21/2014   Hypothyroidism 07/24/2014   Vasomotor flushing 03/05/2014   Right-sided low back pain post T10-S1 fusion 10/14/2013   Coffee ground emesis 03/06/2013   Disorder of bursae and tendons in shoulder region 03/01/2013   Diverticulosis of colon without hemorrhage 02/22/2013   Hyperglycemia 02/01/2013   Essential hypertension, benign 01/31/2013   Osteoporosis 01/31/2013   HSV infection 01/31/2013    Alexsis Kathman, PT 10/12/2021, 8:47 AM  [place],  [name], [address] Phone: [phone]   Fax:  [phone]  Name: Kaylea Mounsey MRN: [id number] Date  of Birth: [birth year]

## 2021-10-13 ENCOUNTER — Encounter: Payer: Medicare HMO | Admitting: Physical Therapy

## 2021-10-17 ENCOUNTER — Other Ambulatory Visit: Payer: Self-pay | Admitting: Sports Medicine

## 2021-10-18 ENCOUNTER — Encounter: Payer: Medicare HMO | Admitting: Physical Therapy

## 2021-10-18 DIAGNOSIS — K219 Gastro-esophageal reflux disease without esophagitis: Secondary | ICD-10-CM | POA: Diagnosis not present

## 2021-10-18 DIAGNOSIS — R490 Dysphonia: Secondary | ICD-10-CM | POA: Diagnosis not present

## 2021-10-19 ENCOUNTER — Other Ambulatory Visit: Payer: Self-pay

## 2021-10-19 ENCOUNTER — Ambulatory Visit: Payer: Medicare HMO | Admitting: Physical Therapy

## 2021-10-19 DIAGNOSIS — M6281 Muscle weakness (generalized): Secondary | ICD-10-CM | POA: Diagnosis not present

## 2021-10-19 DIAGNOSIS — R29898 Other symptoms and signs involving the musculoskeletal system: Secondary | ICD-10-CM

## 2021-10-19 DIAGNOSIS — M546 Pain in thoracic spine: Secondary | ICD-10-CM

## 2021-10-19 NOTE — Therapy (Signed)
[place], [name], [address] Phone: [phone]   Fax:  [phone]  Physical Therapy Treatment  Patient Details  Name: Margaret [name] MRN: [id number] Date of Birth: [birth year] Referring Provider (PT): Chevis Pretty   Encounter Date: 10/19/2021   PT End of Session - 10/19/21 0856     Visit Number 6    Number of Visits 12    Date for PT Re-Evaluation 11/03/21    Authorization Type [employer] Medicare    Authorization - Visit Number 6    Progress Note Due on Visit 10    PT Start Time [phone]   pt arrived late   PT Stop Time 0925    PT Time Calculation (min) 34 min    Activity Tolerance Patient tolerated treatment well    Behavior During Therapy [facility] for tasks assessed/performed             Past Medical History:  Diagnosis Date   Anemia    Anxiety    Arthritis    Asthma    Cancer ([place])    COPD (chronic obstructive pulmonary disease) ([place])    COVID-19 07/16/2020   Emphysema of lung ([place])    GERD (gastroesophageal reflux disease)    Hypertension    Mycobacterium avium complex ([place]) 10/07/2020   Osteoporosis    Thyroid disease     Past Surgical History:  Procedure Laterality Date   ABDOMINAL HYSTERECTOMY     BREAST SURGERY     CESAREAN SECTION     JOINT REPLACEMENT     LT KNEE   TOTAL KNEE ARTHROPLASTY  2006    There were no vitals filed for this visit.   Subjective Assessment - 10/19/21 0853     Subjective Pt reports her R leg feels like it's getting stronger.  She hasn't been able to do as many exercises as she'd like, but she has made an effort to stand on Rt leg and side step at counter.    Pertinent History L1-L3 fusion, Rt hip replacement 02/2021    Diagnostic tests x rays performed at [facility], pt states they show arthritis    Currently in Pain? No/denies    Pain Score 0-No pain                [facility] PT Assessment - 10/19/21 0001       Assessment   Medical  Diagnosis Pain in Rt hip    Referring Provider (PT) Chevis Pretty    Onset Date/Surgical Date 02/10/21    Hand Dominance Right    Next MD Visit PRN    Prior Therapy for back and hip pain      Strength   Right Hip ABduction 3-/5              [facility] Adult PT Treatment/Exercise - 10/19/21 0001       Lumbar Exercises: Stretches   Lower Trunk Rotation 5 reps;10 seconds   legs on orange pball     Lumbar Exercises: Aerobic   Nustep L4 legs x 5 min for warm up.    Other Aerobic Exercise single laps around gym between some exercises.      Lumbar Exercises: Standing   Heel Raises 10 reps;2 seconds    Side Lunge 10 reps   UE on counter   Other Standing Lumbar Exercises Opp arm/leg with hands on cabinets x 5 each side. (difficult lifting RUE- TSA)    Other Standing Lumbar Exercises backward walking at  counter 6 x 12', side stepping without UE support.  Foot taps to 6" step x 10, then forward step up on 6" step with RLE, retro step with LLE x 10      Lumbar Exercises: Seated   Sit to Stand 10 reps   no UE support, staggered stance, RLE back.  2 sets     Lumbar Exercises: Supine   Bridge 15 reps   legs on orange ball   Other Supine Lumbar Exercises bridge with hamstring curls x 10, with legs on orange PB.      Lumbar Exercises: Sidelying   Hip Abduction Right;15 reps   AAROM from therapist to get to full range,  cues to lead with heel instead of toes.                         PT Long Term Goals - 10/11/21 1027       PT LONG TERM GOAL #1   Title Pt will be independent in HEP    Time 6    Period Weeks    Status On-going      PT LONG TERM GOAL #2   Title Pt will improve FOTO to >= 59 to demo improved functional mobility    Time 6    Period Weeks    Status On-going      PT LONG TERM GOAL #3   Title Pt will improve bilat LE strength to 4+/5 to improve standing and walking tolerance    Time 6    Period Weeks    Status On-going      PT LONG TERM GOAL #4    Title Pt will tolerate walking x 20 minutes with pain <= 2/10    Time 6    Period Weeks    Status On-going                   Plan - 10/19/21 1216     Clinical Impression Statement Pt demonstrating improved gait quality, however she fatigues quickly with standing exercise, resulting in increased trendelenberg of Rt hip.  Rt hip abdct remain weak (3-/5 in sidelying).  She tolerated exercises well, without increase in Lt back pain.  No longer using AD with gait in community. Progressing gradually towards LTGs.    PT Frequency 2x / week    PT Duration 6 weeks    PT Treatment/Interventions Aquatic Therapy;Cryotherapy;Moist Heat;Electrical Stimulation;Stair training;Gait training;Therapeutic exercise;Balance training;Neuromuscular re-education;Therapeutic activities;Patient/family education;Manual techniques;Dry needling;Taping    PT Next Visit Plan progress HEP as tolerated.  continue hip and core strengthening.    PT Home Exercise Plan [email or web address]    Consulted and Agree with Plan of Care Patient             Patient will benefit from skilled therapeutic intervention in order to improve the following deficits and impairments:  Pain, Postural dysfunction, Impaired flexibility, Decreased activity tolerance, Decreased range of motion, Increased muscle spasms, Difficulty walking, Decreased endurance, Decreased strength  Visit Diagnosis: Pain in thoracic spine  Other symptoms and signs involving the musculoskeletal system  Muscle weakness (generalized)     Problem List Patient Active Problem List   Diagnosis Date Noted   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/07/2020   COVID-19 07/16/2020   Mild persistent asthma with acute exacerbation 02/01/2019   Renal insufficiency 02/01/2019   Acute cystitis 04/23/2018   H/O total knee replacement, left 10/23/2017   DJD of right shoulder 07/07/2017   Iron deficiency anemia  05/31/2017   Pulmonary nodules 12/26/2016   Shoulder pain, right  12/25/2016   Other chronic pain 11/03/2016   Thyroid nodule 03/30/2016   Solitary pulmonary nodule 03/30/2016   Strain of gluteus medius 10/07/2015   Depression with anxiety 09/29/2015   Hip pain, acute 08/31/2015   Vitamin D insufficiency 04/08/2015   Elevated vitamin B12 level 04/08/2015   Hirsutism 01/28/2015   Osteoarthritis of right knee 11/04/2014   Primary osteoarthritis of right hip 08/21/2014   Hair loss 08/21/2014   Hypothyroidism 07/24/2014   Vasomotor flushing 03/05/2014   Right-sided low back pain post T10-S1 fusion 10/14/2013   Coffee ground emesis 03/06/2013   Disorder of bursae and tendons in shoulder region 03/01/2013   Diverticulosis of colon without hemorrhage 02/22/2013   Hyperglycemia 02/01/2013   Essential hypertension, benign 01/31/2013   Osteoporosis 01/31/2013   HSV infection 01/31/2013   Kerin Perna, PTA 10/19/21 12:49 PM  [place], [name], [address] Phone: [phone]   Fax:  [phone]  Name: Margaret [name] MRN: [id number] Date of Birth: 12/07/1945

## 2021-10-20 ENCOUNTER — Encounter: Payer: Medicare HMO | Admitting: Physical Therapy

## 2021-10-22 ENCOUNTER — Encounter: Payer: Self-pay | Admitting: Physical Therapy

## 2021-10-22 ENCOUNTER — Ambulatory Visit: Payer: Medicare HMO | Admitting: Physical Therapy

## 2021-10-22 ENCOUNTER — Other Ambulatory Visit: Payer: Self-pay

## 2021-10-22 DIAGNOSIS — R29898 Other symptoms and signs involving the musculoskeletal system: Secondary | ICD-10-CM

## 2021-10-22 DIAGNOSIS — M6281 Muscle weakness (generalized): Secondary | ICD-10-CM

## 2021-10-22 DIAGNOSIS — M546 Pain in thoracic spine: Secondary | ICD-10-CM

## 2021-10-22 NOTE — Therapy (Signed)
[place], [name], [address] Phone: [phone]   Fax:  [phone]  Physical Therapy Treatment  Patient Details  Name: Margaret [name] MRN: [id number] Date of Birth: 1946/03/01 Referring Provider (PT): Chevis Pretty   Encounter Date: 10/22/2021   PT End of Session - 10/22/21 0935     Visit Number 7    Number of Visits 12    Date for PT Re-Evaluation 11/03/21    Authorization Type [employer] Medicare    Authorization - Visit Number 7    Progress Note Due on Visit 10    PT Start Time 0935    PT Stop Time 1013    PT Time Calculation (min) 38 min    Activity Tolerance Patient tolerated treatment well    Behavior During Therapy [facility] for tasks assessed/performed             Past Medical History:  Diagnosis Date   Anemia    Anxiety    Arthritis    Asthma    Cancer ([place])    COPD (chronic obstructive pulmonary disease) ([place])    COVID-19 07/16/2020   Emphysema of lung ([place])    GERD (gastroesophageal reflux disease)    Hypertension    Mycobacterium avium complex ([place]) 10/07/2020   Osteoporosis    Thyroid disease     Past Surgical History:  Procedure Laterality Date   ABDOMINAL HYSTERECTOMY     BREAST SURGERY     CESAREAN SECTION     JOINT REPLACEMENT     LT KNEE   TOTAL KNEE ARTHROPLASTY  2006    There were no vitals filed for this visit.   Subjective Assessment - 10/22/21 1425     Subjective "I can't believe I don't have any back pain."  Pt reports she feels like she is getting stronger.    Pertinent History L1-L3 fusion, Rt hip replacement 02/2021    Diagnostic tests x rays performed at [facility], pt states they show arthritis    Currently in Pain? No/denies    Pain Score 0-No pain                [facility] PT Assessment - 10/22/21 0001       Assessment   Medical Diagnosis Pain in Rt hip    Referring Provider (PT) Chevis Pretty    Onset Date/Surgical Date 02/10/21     Hand Dominance Right    Next MD Visit PRN    Prior Therapy for back and hip pain              [facility] Adult PT Treatment/Exercise - 10/22/21 0001       Lumbar Exercises: Stretches   Lower Trunk Rotation 5 reps;10 seconds   legs on orange pball   Figure 4 Stretch 1 rep;20 seconds      Lumbar Exercises: Standing   Other Standing Lumbar Exercises Lt hip hike in Rt SLS for glute med strengthening x 10      Lumbar Exercises: Seated   Sit to Stand 10 reps   no UE support, staggered stance, RLE back.  eccentric lowering     Lumbar Exercises: Supine   Single Leg Bridge 10 reps   RLE   Straight Leg Raise 10 reps;2 seconds   RLE   Other Supine Lumbar Exercises 90/90 toe taps x 10 each leg (alternating)      Lumbar Exercises: Sidelying   Clam Right;Left;10 reps    Hip Abduction Right;15 reps;Left;10 reps  AAROM from therapist to get to full range,  cues to lead with heel instead of toes.     Lumbar Exercises: Quadruped   Straight Leg Raise 5 reps    Other Quadruped Lumbar Exercises fire hydrants x 10 reps each side.      Knee/Hip Exercises: Standing   Lateral Step Up Right;1 set;10 reps;Hand Hold: 2;Step Height: 6"    Forward Step Up Right;1 set;10 reps;Hand Hold: 2;Step Height: 6"    SLS RLE: x 15 sec x 2 with intermittent UE to steady.                     PT Education - 10/22/21 1014     Education Details pt educated on hip musculature we are working to strengthen; shown pics during discussion.    Person(s) Educated Patient    Methods Explanation;Other (comment)    Comprehension Verbalized understanding                 PT Long Term Goals - 10/11/21 1027       PT LONG TERM GOAL #1   Title Pt will be independent in HEP    Time 6    Period Weeks    Status On-going      PT LONG TERM GOAL #2   Title Pt will improve FOTO to >= 59 to demo improved functional mobility    Time 6    Period Weeks    Status On-going      PT LONG TERM GOAL #3   Title Pt  will improve bilat LE strength to 4+/5 to improve standing and walking tolerance    Time 6    Period Weeks    Status On-going      PT LONG TERM GOAL #4   Title Pt will tolerate walking x 20 minutes with pain <= 2/10    Time 6    Period Weeks    Status On-going                   Plan - 10/22/21 1004     Clinical Impression Statement RLE strength gradually improving; SLR completed with ease compared to first visit. Continues to have limited range with sidelying abdct and trendelenburg with Rt SLS.  Plan to progress towards LTGs.    PT Frequency 2x / week    PT Duration 6 weeks    PT Treatment/Interventions Aquatic Therapy;Cryotherapy;Moist Heat;Electrical Stimulation;Stair training;Gait training;Therapeutic exercise;Balance training;Neuromuscular re-education;Therapeutic activities;Patient/family education;Manual techniques;Dry needling;Taping    PT Next Visit Plan progress HEP as tolerated.  continue hip and core strengthening.    PT Home Exercise Plan [id number]    Consulted and Agree with Plan of Care Patient             Patient will benefit from skilled therapeutic intervention in order to improve the following deficits and impairments:  Pain, Postural dysfunction, Impaired flexibility, Decreased activity tolerance, Decreased range of motion, Increased muscle spasms, Difficulty walking, Decreased endurance, Decreased strength  Visit Diagnosis: Pain in thoracic spine  Muscle weakness (generalized)  Other symptoms and signs involving the musculoskeletal system     Problem List Patient Active Problem List   Diagnosis Date Noted   Mycobacterium avium complex ([place]) 10/07/2020   COVID-19 07/16/2020   Mild persistent asthma with acute exacerbation 02/01/2019   Renal insufficiency 02/01/2019   Acute cystitis 04/23/2018   H/O total knee replacement, left 10/23/2017   DJD of right shoulder 07/07/2017   Iron deficiency anemia  05/31/2017   Pulmonary nodules 12/26/2016    Shoulder pain, right 12/25/2016   Other chronic pain 11/03/2016   Thyroid nodule 03/30/2016   Solitary pulmonary nodule 03/30/2016   Strain of gluteus medius 10/07/2015   Depression with anxiety 09/29/2015   Hip pain, acute 08/31/2015   Vitamin D insufficiency 04/08/2015   Elevated vitamin B12 level 04/08/2015   Hirsutism 01/28/2015   Osteoarthritis of right knee 11/04/2014   Primary osteoarthritis of right hip 08/21/2014   Hair loss 08/21/2014   Hypothyroidism 07/24/2014   Vasomotor flushing 03/05/2014   Right-sided low back pain post T10-S1 fusion 10/14/2013   Coffee ground emesis 03/06/2013   Disorder of bursae and tendons in shoulder region 03/01/2013   Diverticulosis of colon without hemorrhage 02/22/2013   Hyperglycemia 02/01/2013   Essential hypertension, benign 01/31/2013   Osteoporosis 01/31/2013   HSV infection 01/31/2013   Kerin Perna, PTA 10/22/21 2:28 PM  [place], [name], [address] Phone: [phone]   Fax:  [phone]  Name: Margaret [name] MRN: [id number] Date of Birth: 25-Oct-1946

## 2021-10-26 ENCOUNTER — Ambulatory Visit: Payer: Medicare HMO | Admitting: Physical Therapy

## 2021-10-26 ENCOUNTER — Encounter: Payer: Self-pay | Admitting: Physical Therapy

## 2021-10-26 ENCOUNTER — Other Ambulatory Visit: Payer: Self-pay

## 2021-10-26 DIAGNOSIS — M546 Pain in thoracic spine: Secondary | ICD-10-CM | POA: Diagnosis not present

## 2021-10-26 DIAGNOSIS — M6281 Muscle weakness (generalized): Secondary | ICD-10-CM | POA: Diagnosis not present

## 2021-10-26 DIAGNOSIS — R29898 Other symptoms and signs involving the musculoskeletal system: Secondary | ICD-10-CM | POA: Diagnosis not present

## 2021-10-26 NOTE — Therapy (Addendum)
[place], [name], [address] Phone: [phone]   Fax:  [phone]  Physical Therapy Treatment and Discharge  Patient Details  Name: Margaret [name] MRN: [id number] Date of Birth: [birth year] Referring Provider (PT): Chevis Pretty   Encounter Date: 10/26/2021   PT End of Session - 10/26/21 1102     Visit Number 8    Number of Visits 12    Date for PT Re-Evaluation 11/03/21    Authorization Type [employer]    Authorization - Visit Number 8    Progress Note Due on Visit 10    PT Start Time 1102    PT Stop Time 1140    PT Time Calculation (min) 38 min    Activity Tolerance Patient tolerated treatment well    Behavior During Therapy [facility] for tasks assessed/performed             Past Medical History:  Diagnosis Date   Anemia    Anxiety    Arthritis    Asthma    Cancer ([place])    COPD (chronic obstructive pulmonary disease) ([place])    COVID-19 07/16/2020   Emphysema of lung ([place])    GERD (gastroesophageal reflux disease)    Hypertension    Mycobacterium avium complex ([place]) 10/07/2020   Osteoporosis    Thyroid disease     Past Surgical History:  Procedure Laterality Date   ABDOMINAL HYSTERECTOMY     BREAST SURGERY     CESAREAN SECTION     JOINT REPLACEMENT     LT KNEE   TOTAL KNEE ARTHROPLASTY  2006    There were no vitals filed for this visit.   Subjective Assessment - 10/26/21 1102     Subjective Pt reports she doesn't have any back or hip pain.  She continues to work on her exercises.  She states that her husband told her she is still limping.    Pertinent History L1-L3 fusion, Rt hip replacement 02/2021    Patient Stated Goals decrease pain    Currently in Pain? No/denies    Pain Score 0-No pain                [facility] PT Assessment - 10/26/21 0001       Assessment   Medical Diagnosis Pain in Rt hip    Referring Provider (PT) Chevis Pretty    Onset  Date/Surgical Date 02/10/21    Hand Dominance Right    Next MD Visit PRN    Prior Therapy for back and hip pain      Observation/Other Assessments   Focus on Therapeutic Outcomes (FOTO)  58% functional score      AROM   Lumbar Flexion 75%    Lumbar - Right Side Bend limited 50% - with pain    Lumbar - Left Side Bend limited 50% - with pain               [facility] Adult PT Treatment/Exercise - 10/26/21 0001       Lumbar Exercises: Stretches   Lower Trunk Rotation 5 reps;10 seconds   legs on orange pball   Quad Stretch Right;2 reps;20 seconds   prone with strap         Lumbar Exercises: Aerobic   Nustep L5 legs x 5.5 min for warm up.      Lumbar Exercises: Supine   Bridge Limitations bridge with legs on orange pball x 10, x 2 sets (curling legs in/out after each  rep)      Lumbar Exercises: Sidelying   Clam Right;Left;15 reps    Hip Abduction Right;12 reps x 1 set,  5 reps with AAROM -assist from therapist.      Lumbar Exercises: Quadruped   Straight Leg Raise 10 reps   hands on yoga blocks.   Other Quadruped Lumbar Exercises fire hydrants x 10-12 reps each side.      Knee/Hip Exercises: Standing   Lateral Step Up Right;1 set;10 reps;Hand Hold: 2;Step Height: 6"    SLS RLE: x 15 sec x 2 with intermittent UE to steady - with tactile cues to keep pelvis level.    Other Standing Knee Exercises retro gait without/ with support on counter working to keep hips level, avoiding trendelenburg on Rt x 30 ft;  high knee marching x 15               PT Long Term Goals - 10/26/21 1131       PT LONG TERM GOAL #1   Title Pt will be independent in HEP    Time 6    Period Weeks    Status On-going      PT LONG TERM GOAL #2   Title Pt will improve FOTO to >= 59 to demo improved functional mobility    Baseline 58 : 10/26/21    Time 6    Period Weeks    Status On-going      PT LONG TERM GOAL #3   Title Pt will improve bilat LE strength to 4+/5 to improve standing and walking  tolerance    Time 6    Period Weeks    Status Partially Met      PT LONG TERM GOAL #4   Title Pt will tolerate walking x 20 minutes with pain <= 2/10    Baseline pt hasn't attempted: 10/26/21    Time 6    Period Weeks    Status On-going                   Plan - 10/26/21 1241     Clinical Impression Statement Continued weakness in Rt hip abductors;  fatigues quickly with standing exercises.  FOTO score improved; 1 point away from meeting goal.  Pt has partially met her goals.  Pt [place] away at beach for 2 w[place] and plans to return afterwards.    PT Frequency 2x / week    PT Duration 6 weeks    PT Treatment/Interventions Aquatic Therapy;Cryotherapy;Moist Heat;Electrical Stimulation;Stair training;Gait training;Therapeutic exercise;Balance training;Neuromuscular re-education;Therapeutic activities;Patient/family education;Manual techniques;Dry needling;Taping    PT Next Visit Plan continue hip and core strengthening.    PT Home Exercise Plan [id number]    Consulted and Agree with Plan of Care Patient             Patient will benefit from skilled therapeutic intervention in order to improve the following deficits and impairments:  Pain, Postural dysfunction, Impaired flexibility, Decreased activity tolerance, Decreased range of motion, Increased muscle spasms, Difficulty walking, Decreased endurance, Decreased strength  Visit Diagnosis: Pain in thoracic spine  Muscle weakness (generalized)  Other symptoms and signs involving the musculoskeletal system     Problem List Patient Active Problem List   Diagnosis Date Noted   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   COVID-19 07/16/2020   Mild persistent asthma with acute exacerbation 02/01/2019   Renal insufficiency 02/01/2019   Acute cystitis 04/23/2018   H/O total knee replacement, left 10/23/2017   DJD of right  shoulder 07/07/2017   Iron deficiency anemia 05/31/2017   Pulmonary nodules 12/26/2016   Shoulder  pain, right 12/25/2016   Other chronic pain 11/03/2016   Thyroid nodule 03/30/2016   Solitary pulmonary nodule 03/30/2016   Strain of gluteus medius 10/07/2015   Depression with anxiety 09/29/2015   Hip pain, acute 08/31/2015   Vitamin D insufficiency 04/08/2015   Elevated vitamin B12 level 04/08/2015   Hirsutism 01/28/2015   Osteoarthritis of right knee 11/04/2014   Primary osteoarthritis of right hip 08/21/2014   Hair loss 08/21/2014   Hypothyroidism 07/24/2014   Vasomotor flushing 03/05/2014   Right-sided low back pain post T10-S1 fusion 10/14/2013   Coffee ground emesis 03/06/2013   Disorder of bursae and tendons in shoulder region 03/01/2013   Diverticulosis of colon without hemorrhage 02/22/2013   Hyperglycemia 02/01/2013   Essential hypertension, benign 01/31/2013   Osteoporosis 01/31/2013   HSV infection 01/31/2013  PHYSICAL THERAPY DISCHARGE SUMMARY  Visits from Start of Care: 8  Current functional level related to goals / functional outcomes: Improved mobility, strength   Remaining deficits: See above   Education / Equipment: HEP   Patient agrees to discharge. Patient goals were partially met. Patient is being discharged due to being pleased with the current functional level. Isabelle Course, PT,DPT12/13/2210:09 AM   Kerin Perna, PTA 10/26/21 12:46 PM  [place], [name], [address] Phone: [phone]   Fax:  [phone]  Name: Margaret [name] MRN: [id number] Date of Birth: 23-Jan-1946

## 2021-11-01 ENCOUNTER — Telehealth: Payer: Self-pay | Admitting: Osteopathic Medicine

## 2021-11-01 NOTE — Telephone Encounter (Signed)
Pt called.  She is requesting refill on her thyroid meds.

## 2021-11-05 ENCOUNTER — Ambulatory Visit (INDEPENDENT_AMBULATORY_CARE_PROVIDER_SITE_OTHER): Payer: Medicare HMO | Admitting: Family Medicine

## 2021-11-05 ENCOUNTER — Other Ambulatory Visit: Payer: Self-pay

## 2021-11-05 ENCOUNTER — Encounter: Payer: Self-pay | Admitting: Family Medicine

## 2021-11-05 VITALS — BP 126/73 | HR 68 | Temp 97.5°F | Ht 61.0 in | Wt 122.1 lb

## 2021-11-05 DIAGNOSIS — E039 Hypothyroidism, unspecified: Secondary | ICD-10-CM

## 2021-11-05 DIAGNOSIS — Z23 Encounter for immunization: Secondary | ICD-10-CM

## 2021-11-05 DIAGNOSIS — I10 Essential (primary) hypertension: Secondary | ICD-10-CM

## 2021-11-05 MED ORDER — LEVOTHYROXINE SODIUM 50 MCG PO TABS: 50.0000 ug | ORAL_TABLET | Freq: Every day | ORAL | 0 refills | Status: DC

## 2021-11-05 NOTE — Patient Instructions (Signed)
Thyroid medicine refilled.  Labs today - we will let you know if we make any changes to your medications.  Keep your upcoming appointment with Samuel Bouche, FNP.

## 2021-11-05 NOTE — Progress Notes (Signed)
Established Patient Office Visit  Subjective:  Patient ID: Margaret [name], female    DOB: [birth year]  Age: 75 y.o. MRN: [id number]  CC:  Chief Complaint  Patient presents with   Follow-up    HPI Margaret [name] presents for synthroid refill.   HYPOTHYROIDISM: Currently on Synthroid 50 mcg daily.  No concerns. No unusual fatigue, skin/nail changes, chest pain, palpitations, shortness of breath, weight changes.  Continues to have some mild hair loss.  States she had forgotten that she was supposed to be taking this at the same time each day before any other meds.    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   Alcohol abuse Father    COPD Sister    Alcohol abuse Brother 75   Cirrhosis Brother    Cancer Maternal Grandmother 74       Stomach   Stroke Maternal Grandfather 22    Social History   Socioeconomic History   Marital status: Married    Spouse name: Not on file   Number of children: Not on file   Years of education: Not on file   Highest education level: Not on file  Occupational History   Not on file  Tobacco Use   Smoking status: Former    Packs/day: 1.50    Years: 40.00    Pack years: 60.00    Types: Cigarettes    Quit date: 02/01/2008    Years since quitting: 13.7   Smokeless tobacco: Never  Vaping Use   Vaping Use: Never used  Substance and  Sexual Activity   Alcohol use: Yes    Comment: SOCIALLY   Drug use: No   Sexual activity: Yes    Birth control/protection: None  Other Topics Concern   Not on file  Social History Narrative   ** Merged History Encounter **       Social Determinants of Health   Financial Resource Strain: Not on file  Food Insecurity: Not on file  Transportation Needs: Not on file  Physical Activity: Not on file  Stress: Not on file  Social Connections: Not on file  Intimate Partner Violence: Not on file    Outpatient Medications Prior to Visit  Medication Sig Dispense Refill   albuterol (PROVENTIL HFA;VENTOLIN HFA) 108 (90 Base) MCG/ACT inhaler Inhale 1-2 puffs into the lungs every 4 (four) hours as needed for wheezing. 1 Inhaler 0   AMBULATORY NON FORMULARY MEDICATION Medication Name: home BP monitor, upper arm 1 Units 99   amLODipine (NORVASC) 10 MG tablet TAKE 1 TABLET BY MOUTH EVERY DAY 90 tablet 1   FLUoxetine (PROZAC) 20 MG capsule Take 1 capsule (20 mg total) by  mouth daily. In combination with Phentermine 90 capsule 3   Fluticasone Furoate (ARNUITY ELLIPTA) 100 MCG/ACT AEPB INHALE ONE PUFF INTO THE LUNGS DAILY FOR 30 DAYS. RINSE MOUTH AFTER EACH USE.     HYDROcodone-acetaminophen (NORCO) 10-325 MG tablet Take 1 tablet by mouth every 6 (six) hours as needed.      ipratropium (ATROVENT) 0.06 % nasal spray PLACE 2 SPRAYS INTO BOTH NOSTRILS 4 (FOUR) TIMES DAILY. PRN SINUS CONGESTION 15 mL 1   losartan (COZAAR) 25 MG tablet TAKE 1 TABLET BY MOUTH EVERY DAY 90 tablet 1   omeprazole (PRILOSEC) 40 MG capsule TAKE 1 CAPSULE BY MOUTH EVERY DAY 90 capsule 0   levothyroxine (SYNTHROID) 50 MCG tablet Take 1 tablet (50 mcg total) by mouth daily. NO REFILLS. OVERDUE FOR LABS/TRANSITION CARE TO NEW PROVIDER. 30 tablet 0   lidocaine (XYLOCAINE) 2 % jelly Apply 1 application topically as needed. 30 mL 0   montelukast (SINGULAIR) 10 MG tablet Take 10 mg by mouth daily.  2   No facility-administered  medications prior to visit.    Allergies  Allergen Reactions   Bacitracin-Neomycin-Polymyxin Anaphylaxis and Hives   Neomycin-Bacitracin Zn-Polymyx Anaphylaxis   Clarithromycin Rash and Other (See Comments)    THRUSH     Ofloxacin Hives, Nausea And Vomiting, Rash and Other (See Comments)    GI     Other Other (See Comments)    ANTIBIOTIC OINT - UNSURE OF NAME  Hives, GI upset    Raloxifene Other (See Comments)    Back pain/ PT NOT SURE ABOUT THIS REACTION      Sulfa Antibiotics Other (See Comments)    GI UPSET    Tetracycline Other (See Comments) and Nausea And Vomiting   Tetracyclines & Related Nausea And Vomiting    Other reaction(s): Other SEVERE INDIGESTION    Neosporin [Neomycin-Polymyxin-Gramicidin] Hives    ROS Review of Systems All review of systems negative except what is listed in the HPI    Objective:    Physical Exam Vitals reviewed.  Constitutional:      Appearance: Normal appearance. She is normal weight.  HENT:     Head: Normocephalic and atraumatic.  Cardiovascular:     Rate and Rhythm: Normal rate and regular rhythm.     Heart sounds: Normal heart sounds.  Pulmonary:     Effort: Pulmonary effort is normal.     Breath sounds: Normal breath sounds.  Musculoskeletal:     Cervical back: Normal range of motion and neck supple. No tenderness.  Lymphadenopathy:     Cervical: No cervical adenopathy.  Skin:    General: Skin is warm and dry.  Neurological:     General: No focal deficit present.     Mental Status: She is alert and oriented to person, place, and time. Mental status is at baseline.  Psychiatric:        Mood and Affect: Mood normal.        Behavior: Behavior normal.        Thought Content: Thought content normal.        Judgment: Judgment normal.    BP 126/73 (BP Location: Left Arm, Patient Position: Sitting, Cuff Size: Normal)   Pulse 68   Temp (!) 97.5 F (36.4 C) (Oral)   Ht _0  (1.549 m)   Wt 122 lb 1.9 oz (55.4 kg)    SpO2 98%   BMI 23.07 kg/m  Wt Readings from Last 3 Encounters:  11/05/21 122 lb 1.9 oz (55.4 kg)  04/26/21 118 lb (53.5 kg)  11/03/20 122 lb 0.6 oz (55.4 kg)     Health Maintenance Due  Topic Date Due   Zoster Vaccines- Shingrix (1 of 2) Never done   COVID-19 Vaccine (3 - Pfizer risk series) 04/14/2020    There are no preventive care reminders to display for this patient.  Lab Results  Component Value Date   TSH 6.40 (H) 04/28/2020   Lab Results  Component Value Date   WBC 7.7 04/28/2020   HGB 12.7 04/28/2020   HCT 38.2 04/28/2020   MCV 97.7 04/28/2020   PLT 323 04/28/2020   Lab Results  Component Value Date   NA 139 04/28/2020   K 4.6 04/28/2020   CO2 28 04/28/2020   GLUCOSE 87 04/28/2020   BUN 19 04/28/2020   CREATININE 0.94 (H) 04/28/2020   BILITOT 0.4 04/28/2020   ALKPHOS 65 05/26/2017   AST 21 04/28/2020   ALT 18 04/28/2020   PROT 6.4 04/28/2020   ALBUMIN 4.2 05/26/2017   CALCIUM 9.6 04/28/2020   Lab Results  Component Value Date   CHOL 189 02/06/2017   Lab Results  Component Value Date   HDL 83 02/06/2017   Lab Results  Component Value Date   LDLCALC 81 02/06/2017   Lab Results  Component Value Date   TRIG 124 02/06/2017   Lab Results  Component Value Date   CHOLHDL 2.3 02/06/2017   Lab Results  Component Value Date   HGBA1C 5.1 07/11/2018      Assessment & Plan:    1. Hypothyroidism, unspecified type Continue current regimen. Refill ordered. Labs today.  - levothyroxine (SYNTHROID) 50 MCG tablet; Take 1 tablet (50 mcg total) by mouth daily.  Dispense: 90 tablet; Refill: 0 - TSH - CBC - Comprehensive metabolic panel  2. Essential hypertension, benign Continue current regimen. Updating labs today. - TSH - CBC - Comprehensive metabolic panel    Follow-up: Return for as scheduled. Follow-up sooner if needed.    Terrilyn Saver, NP

## 2021-11-06 LAB — COMPREHENSIVE METABOLIC PANEL
AG Ratio: 1.8 (calc) (ref 1.0–2.5)
ALT: 13 U/L (ref 6–29)
AST: 20 U/L (ref 10–35)
Albumin: 4.2 g/dL (ref 3.6–5.1)
Alkaline phosphatase (APISO): 81 U/L (ref 37–153)
BUN: 16 mg/dL (ref 7–25)
CO2: 30 mmol/L (ref 20–32)
Calcium: 9.4 mg/dL (ref 8.6–10.4)
Chloride: 100 mmol/L (ref 98–110)
Creat: 0.69 mg/dL (ref 0.60–1.00)
Globulin: 2.4 g/dL (calc) (ref 1.9–3.7)
Glucose, Bld: 106 mg/dL — ABNORMAL HIGH (ref 65–99)
Potassium: 4.2 mmol/L (ref 3.5–5.3)
Sodium: 138 mmol/L (ref 135–146)
Total Bilirubin: 0.4 mg/dL (ref 0.2–1.2)
Total Protein: 6.6 g/dL (ref 6.1–8.1)

## 2021-11-06 LAB — CBC
HCT: 33.6 % — ABNORMAL LOW (ref 35.0–45.0)
Hemoglobin: 11.3 g/dL — ABNORMAL LOW (ref 11.7–15.5)
MCH: 32.4 pg (ref 27.0–33.0)
MCHC: 33.6 g/dL (ref 32.0–36.0)
MCV: 96.3 fL (ref 80.0–100.0)
MPV: 10.9 fL (ref 7.5–12.5)
Platelets: 275 10*3/uL (ref 140–400)
RBC: 3.49 10*6/uL — ABNORMAL LOW (ref 3.80–5.10)
RDW: 12.5 % (ref 11.0–15.0)
WBC: 9.5 10*3/uL (ref 3.8–10.8)

## 2021-11-06 LAB — TSH: TSH: 3.87 mIU/L (ref 0.40–4.50)

## 2021-11-08 ENCOUNTER — Ambulatory Visit: Payer: Medicare HMO | Admitting: Medical-Surgical

## 2021-11-15 DIAGNOSIS — M25552 Pain in left hip: Secondary | ICD-10-CM | POA: Diagnosis not present

## 2021-11-15 DIAGNOSIS — M4316 Spondylolisthesis, lumbar region: Secondary | ICD-10-CM | POA: Diagnosis not present

## 2021-11-15 DIAGNOSIS — M545 Low back pain, unspecified: Secondary | ICD-10-CM | POA: Diagnosis not present

## 2021-11-15 DIAGNOSIS — M25551 Pain in right hip: Secondary | ICD-10-CM | POA: Diagnosis not present

## 2021-11-16 ENCOUNTER — Encounter: Payer: Medicare HMO | Admitting: Physical Therapy

## 2021-11-19 ENCOUNTER — Other Ambulatory Visit: Payer: Self-pay | Admitting: Osteopathic Medicine

## 2021-11-22 DIAGNOSIS — J329 Chronic sinusitis, unspecified: Secondary | ICD-10-CM | POA: Diagnosis not present

## 2021-11-22 DIAGNOSIS — R490 Dysphonia: Secondary | ICD-10-CM | POA: Diagnosis not present

## 2021-11-23 ENCOUNTER — Ambulatory Visit: Payer: Medicare HMO | Admitting: Medical-Surgical

## 2021-12-07 DIAGNOSIS — M25551 Pain in right hip: Secondary | ICD-10-CM | POA: Diagnosis not present

## 2021-12-07 DIAGNOSIS — Z5189 Encounter for other specified aftercare: Secondary | ICD-10-CM | POA: Diagnosis not present

## 2022-01-01 ENCOUNTER — Other Ambulatory Visit: Payer: Self-pay | Admitting: Osteopathic Medicine

## 2022-01-01 DIAGNOSIS — I10 Essential (primary) hypertension: Secondary | ICD-10-CM

## 2022-01-10 DIAGNOSIS — M545 Low back pain, unspecified: Secondary | ICD-10-CM | POA: Diagnosis not present

## 2022-01-10 DIAGNOSIS — M25552 Pain in left hip: Secondary | ICD-10-CM | POA: Diagnosis not present

## 2022-01-10 DIAGNOSIS — F1190 Opioid use, unspecified, uncomplicated: Secondary | ICD-10-CM | POA: Diagnosis not present

## 2022-01-10 DIAGNOSIS — M25551 Pain in right hip: Secondary | ICD-10-CM | POA: Diagnosis not present

## 2022-01-10 DIAGNOSIS — M4316 Spondylolisthesis, lumbar region: Secondary | ICD-10-CM | POA: Diagnosis not present

## 2022-01-10 DIAGNOSIS — Z5181 Encounter for therapeutic drug level monitoring: Secondary | ICD-10-CM | POA: Diagnosis not present

## 2022-01-10 DIAGNOSIS — Z79899 Other long term (current) drug therapy: Secondary | ICD-10-CM | POA: Diagnosis not present

## 2022-01-19 DIAGNOSIS — K219 Gastro-esophageal reflux disease without esophagitis: Secondary | ICD-10-CM | POA: Diagnosis not present

## 2022-01-19 DIAGNOSIS — R490 Dysphonia: Secondary | ICD-10-CM | POA: Diagnosis not present

## 2022-02-01 ENCOUNTER — Telehealth: Payer: Self-pay

## 2022-02-01 DIAGNOSIS — E039 Hypothyroidism, unspecified: Secondary | ICD-10-CM

## 2022-02-01 DIAGNOSIS — I10 Essential (primary) hypertension: Secondary | ICD-10-CM

## 2022-02-01 MED ORDER — LEVOTHYROXINE SODIUM 50 MCG PO TABS: 50.0000 ug | ORAL_TABLET | Freq: Every day | ORAL | 0 refills | Status: DC

## 2022-02-01 NOTE — Telephone Encounter (Signed)
Levothyroxine refilled

## 2022-02-01 NOTE — Telephone Encounter (Signed)
Patient has made a TOC appointment with Samuel Bouche on 04/05/2022, her next available appointment and she needs a refill on her thyroid medication, she stated she has about a weeks worth left of medication. Please Advise. Routing to Dr Redgie Grayer covering provider. AM

## 2022-02-16 ENCOUNTER — Other Ambulatory Visit: Payer: Self-pay | Admitting: Family Medicine

## 2022-02-24 DIAGNOSIS — F17211 Nicotine dependence, cigarettes, in remission: Secondary | ICD-10-CM | POA: Diagnosis not present

## 2022-02-24 DIAGNOSIS — Z20822 Contact with and (suspected) exposure to covid-19: Secondary | ICD-10-CM | POA: Diagnosis not present

## 2022-02-24 DIAGNOSIS — J432 Centrilobular emphysema: Secondary | ICD-10-CM | POA: Diagnosis not present

## 2022-03-02 DIAGNOSIS — J329 Chronic sinusitis, unspecified: Secondary | ICD-10-CM | POA: Diagnosis not present

## 2022-03-02 DIAGNOSIS — Z9889 Other specified postprocedural states: Secondary | ICD-10-CM | POA: Diagnosis not present

## 2022-03-24 DIAGNOSIS — J322 Chronic ethmoidal sinusitis: Secondary | ICD-10-CM | POA: Diagnosis not present

## 2022-03-24 DIAGNOSIS — J329 Chronic sinusitis, unspecified: Secondary | ICD-10-CM | POA: Diagnosis not present

## 2022-03-24 DIAGNOSIS — J019 Acute sinusitis, unspecified: Secondary | ICD-10-CM | POA: Diagnosis not present

## 2022-03-24 DIAGNOSIS — J320 Chronic maxillary sinusitis: Secondary | ICD-10-CM | POA: Diagnosis not present

## 2022-03-24 DIAGNOSIS — Z9889 Other specified postprocedural states: Secondary | ICD-10-CM | POA: Diagnosis not present

## 2022-03-31 ENCOUNTER — Other Ambulatory Visit: Payer: Self-pay | Admitting: Physician Assistant

## 2022-03-31 DIAGNOSIS — I10 Essential (primary) hypertension: Secondary | ICD-10-CM

## 2022-04-05 ENCOUNTER — Ambulatory Visit: Payer: Medicare HMO | Admitting: Medical-Surgical

## 2022-04-08 ENCOUNTER — Other Ambulatory Visit: Payer: Self-pay | Admitting: Medical-Surgical

## 2022-04-08 ENCOUNTER — Telehealth: Payer: Self-pay

## 2022-04-08 DIAGNOSIS — I10 Essential (primary) hypertension: Secondary | ICD-10-CM

## 2022-04-08 MED ORDER — AMLODIPINE BESYLATE 10 MG PO TABS: 10.0000 mg | ORAL_TABLET | Freq: Every day | ORAL | 0 refills | Status: DC

## 2022-04-08 NOTE — Telephone Encounter (Signed)
Per Patient she is out of her amlodipine. She has an appt scheduled for TOC on 7/13 can a refill be sent to get her through until the appt or will she need a med refill appt sooner. Patient stated she is out of town right now ?

## 2022-04-20 DIAGNOSIS — R490 Dysphonia: Secondary | ICD-10-CM | POA: Diagnosis not present

## 2022-04-20 DIAGNOSIS — J329 Chronic sinusitis, unspecified: Secondary | ICD-10-CM | POA: Diagnosis not present

## 2022-04-20 DIAGNOSIS — J45909 Unspecified asthma, uncomplicated: Secondary | ICD-10-CM | POA: Diagnosis not present

## 2022-04-20 DIAGNOSIS — R9389 Abnormal findings on diagnostic imaging of other specified body structures: Secondary | ICD-10-CM | POA: Diagnosis not present

## 2022-04-20 DIAGNOSIS — A310 Pulmonary mycobacterial infection: Secondary | ICD-10-CM | POA: Diagnosis not present

## 2022-05-04 DIAGNOSIS — R918 Other nonspecific abnormal finding of lung field: Secondary | ICD-10-CM | POA: Diagnosis not present

## 2022-05-04 DIAGNOSIS — J449 Chronic obstructive pulmonary disease, unspecified: Secondary | ICD-10-CM | POA: Diagnosis not present

## 2022-05-04 DIAGNOSIS — R9389 Abnormal findings on diagnostic imaging of other specified body structures: Secondary | ICD-10-CM | POA: Diagnosis not present

## 2022-05-04 DIAGNOSIS — A310 Pulmonary mycobacterial infection: Secondary | ICD-10-CM | POA: Diagnosis not present

## 2022-05-04 DIAGNOSIS — J984 Other disorders of lung: Secondary | ICD-10-CM | POA: Diagnosis not present

## 2022-05-11 ENCOUNTER — Other Ambulatory Visit: Payer: Self-pay

## 2022-05-11 DIAGNOSIS — E039 Hypothyroidism, unspecified: Secondary | ICD-10-CM

## 2022-05-11 DIAGNOSIS — I10 Essential (primary) hypertension: Secondary | ICD-10-CM

## 2022-05-11 MED ORDER — FLUOXETINE HCL 20 MG PO CAPS: 20.0000 mg | ORAL_CAPSULE | Freq: Every day | ORAL | 0 refills | Status: DC

## 2022-05-11 MED ORDER — LEVOTHYROXINE SODIUM 50 MCG PO TABS: 50.0000 ug | ORAL_TABLET | Freq: Every day | ORAL | 0 refills | Status: DC

## 2022-05-11 NOTE — Telephone Encounter (Signed)
90-day refill sent.  Patient will have to attend establish care appointment and have lab work done for further refills.

## 2022-05-11 NOTE — Telephone Encounter (Signed)
The patient called and is currently out of town at [facility] and is out of 2 of her medications and wants to know if you can send them to the [facility] [place], [name].  She has an establish appointment schedule with you on July 13th.

## 2022-05-12 NOTE — Telephone Encounter (Signed)
Patient already has scheduled appt.

## 2022-05-12 NOTE — Telephone Encounter (Signed)
Needs appt

## 2022-05-30 DIAGNOSIS — Z9889 Other specified postprocedural states: Secondary | ICD-10-CM | POA: Diagnosis not present

## 2022-06-09 DIAGNOSIS — M4326 Fusion of spine, lumbar region: Secondary | ICD-10-CM | POA: Diagnosis not present

## 2022-06-09 DIAGNOSIS — M961 Postlaminectomy syndrome, not elsewhere classified: Secondary | ICD-10-CM | POA: Diagnosis not present

## 2022-06-09 DIAGNOSIS — M549 Dorsalgia, unspecified: Secondary | ICD-10-CM | POA: Diagnosis not present

## 2022-06-16 ENCOUNTER — Ambulatory Visit (INDEPENDENT_AMBULATORY_CARE_PROVIDER_SITE_OTHER): Payer: Medicare HMO | Admitting: Medical-Surgical

## 2022-06-16 ENCOUNTER — Encounter: Payer: Self-pay | Admitting: Medical-Surgical

## 2022-06-16 VITALS — BP 121/86 | HR 78 | Ht 60.0 in | Wt 111.0 lb

## 2022-06-16 DIAGNOSIS — Z7689 Persons encountering health services in other specified circumstances: Secondary | ICD-10-CM | POA: Diagnosis not present

## 2022-06-16 DIAGNOSIS — R634 Abnormal weight loss: Secondary | ICD-10-CM | POA: Insufficient documentation

## 2022-06-16 DIAGNOSIS — E039 Hypothyroidism, unspecified: Secondary | ICD-10-CM | POA: Diagnosis not present

## 2022-06-16 DIAGNOSIS — L658 Other specified nonscarring hair loss: Secondary | ICD-10-CM

## 2022-06-16 DIAGNOSIS — I10 Essential (primary) hypertension: Secondary | ICD-10-CM | POA: Diagnosis not present

## 2022-06-16 MED ORDER — LEVOTHYROXINE SODIUM 50 MCG PO TABS: 50.0000 ug | ORAL_TABLET | Freq: Every day | ORAL | 0 refills | Status: DC

## 2022-06-16 MED ORDER — FINASTERIDE 5 MG PO TABS: 2.5000 mg | ORAL_TABLET | Freq: Every day | ORAL | 3 refills | Status: DC

## 2022-06-16 NOTE — Assessment & Plan Note (Signed)
Checking TSH. Continue Levlothyroxine 51mg daily, titrate per results.

## 2022-06-16 NOTE — Assessment & Plan Note (Signed)
Discussed various treatment options.  Start topical minoxidil once daily to the scalp with the most hair loss.  Reviewed risks and benefits of oral finasteride in women. Patient desires to try the medication. Start finasteride 2.'5mg'$  daily.

## 2022-06-16 NOTE — Progress Notes (Signed)
Established Patient Office Visit  Subjective   Patient ID: Margaret [name], female   DOB: 10/15/1946 Age: 76 y.o. MRN: [id number]   Chief Complaint  Patient presents with   Establish Care   HPI Pleasant 76 year old female presenting today to transfer care to a new PCP and for the following:  HTN: taking amlodipine '10mg'$  daily, tolerating well without side effects. Following a low sodium diet. Stays active overall. Denies CP, SOB, palpitations, lower extremity edema, dizziness, headaches, or vision changes.  Hypothyroidism: taking levothyroxine '50mg'$  daily, tolerating well without side effects. Has had some unintentional weight loss over the last several months. Denies palpitations, skin changes, brittle nails. Has had some increased hair fall lately.   Has noted greater than 10lbs unintentional weight loss in the past few months. Notes that she has a very poor appetite since she had her sinus surgery. No other associated symptoms. Has had a chest CT in the last couple of months with pulmonology. No other indications of infection.    Objective:    Vitals:   06/16/22 1101  BP: 121/86  Pulse: 78  Height: 5' (1.524 m)  Weight: 111 lb (50.3 kg)  SpO2: 99%  BMI (Calculated): 21.68    Physical Exam Vitals and nursing note reviewed.  Constitutional:      General: She is not in acute distress.    Appearance: Normal appearance. She is normal weight. She is not ill-appearing.  HENT:     Head: Normocephalic and atraumatic.  Cardiovascular:     Rate and Rhythm: Normal rate and regular rhythm.     Pulses: Normal pulses.     Heart sounds: Normal heart sounds.  Pulmonary:     Effort: Pulmonary effort is normal. No respiratory distress.     Breath sounds: Normal breath sounds. No wheezing, rhonchi or rales.  Skin:    General: Skin is warm and dry.  Neurological:     Mental Status: She is alert and oriented to person, place, and time.  Psychiatric:        Mood and Affect: Mood normal.         Behavior: Behavior normal.        Thought Content: Thought content normal.        Judgment: Judgment normal.   No results found for this or any previous visit (from the past 24 hour(s)).     The ASCVD Risk score (Arnett DK, et al., 2019) failed to calculate for the following reasons:   Cannot find a previous HDL lab   Cannot find a previous total cholesterol lab   Assessment & Plan:   Problem List Items Addressed This Visit       Cardiovascular and Mediastinum   Essential hypertension, benign (Chronic)    BP at goal.  Continue amlodipine '10mg'$  daily.  Checking labs.  Low sodium diet recommended.  Regular intention exercise recommended.  Monitor BP at home with a goal of 130/80 or less.       Relevant Orders   CBC with Differential/Platelet   COMPLETE METABOLIC PANEL WITH GFR   Lipid panel     Endocrine   Hypothyroidism - Primary (Chronic)    Checking TSH. Continue Levlothyroxine 51mg daily, titrate per results.      Relevant Medications   levothyroxine (SYNTHROID) 50 MCG tablet   Other Relevant Orders   TSH     Musculoskeletal and Integument   Female pattern hair loss    Discussed various treatment options.  Start topical minoxidil once  daily to the scalp with the most hair loss.  Reviewed risks and benefits of oral finasteride in women. Patient desires to try the medication. Start finasteride 2.'5mg'$  daily.       Relevant Medications   finasteride (PROSCAR) 5 MG tablet     Other   Weight loss    Unclear etiology.  Checking labs.  Suspect weight loss is related to loss of appetite and poor dietary habits.  Work on increasing caloric intake with a focus on adequate protein.  Eat frequent snacks and small meals throughout the day.       Relevant Orders   Hemoglobin A1c   Other Visit Diagnoses     Encounter to establish care            Return in about 6 months (around 12/17/2022) for chronic disease follow  up.  ___________________________________________ Clearnce Sorrel, DNP, APRN, FNP-BC [facility] [place]

## 2022-06-16 NOTE — Assessment & Plan Note (Signed)
BP at goal.  Continue amlodipine '10mg'$  daily.  Checking labs.  Low sodium diet recommended.  Regular intention exercise recommended.  Monitor BP at home with a goal of 130/80 or less.

## 2022-06-16 NOTE — Assessment & Plan Note (Signed)
Unclear etiology.  Checking labs.  Suspect weight loss is related to loss of appetite and poor dietary habits.  Work on increasing caloric intake with a focus on adequate protein.  Eat frequent snacks and small meals throughout the day.

## 2022-06-17 DIAGNOSIS — E039 Hypothyroidism, unspecified: Secondary | ICD-10-CM | POA: Diagnosis not present

## 2022-06-17 DIAGNOSIS — I10 Essential (primary) hypertension: Secondary | ICD-10-CM | POA: Diagnosis not present

## 2022-06-17 DIAGNOSIS — R634 Abnormal weight loss: Secondary | ICD-10-CM | POA: Diagnosis not present

## 2022-06-18 LAB — CBC WITH DIFFERENTIAL/PLATELET
Absolute Monocytes: 722 cells/uL (ref 200–950)
Basophils Absolute: 41 cells/uL (ref 0–200)
Basophils Relative: 0.5 %
Eosinophils Absolute: 254 cells/uL (ref 15–500)
Eosinophils Relative: 3.1 %
HCT: 41.6 % (ref 35.0–45.0)
Hemoglobin: 13.7 g/dL (ref 11.7–15.5)
Lymphs Abs: 1115 cells/uL (ref 850–3900)
MCH: 31.4 pg (ref 27.0–33.0)
MCHC: 32.9 g/dL (ref 32.0–36.0)
MCV: 95.4 fL (ref 80.0–100.0)
MPV: 10.7 fL (ref 7.5–12.5)
Monocytes Relative: 8.8 %
Neutro Abs: 6068 cells/uL (ref 1500–7800)
Neutrophils Relative %: 74 %
Platelets: 271 10*3/uL (ref 140–400)
RBC: 4.36 10*6/uL (ref 3.80–5.10)
RDW: 11.7 % (ref 11.0–15.0)
Total Lymphocyte: 13.6 %
WBC: 8.2 10*3/uL (ref 3.8–10.8)

## 2022-06-18 LAB — COMPLETE METABOLIC PANEL WITH GFR
AG Ratio: 1.8 (calc) (ref 1.0–2.5)
ALT: 15 U/L (ref 6–29)
AST: 19 U/L (ref 10–35)
Albumin: 4.3 g/dL (ref 3.6–5.1)
Alkaline phosphatase (APISO): 86 U/L (ref 37–153)
BUN: 16 mg/dL (ref 7–25)
CO2: 28 mmol/L (ref 20–32)
Calcium: 9.4 mg/dL (ref 8.6–10.4)
Chloride: 104 mmol/L (ref 98–110)
Creat: 0.78 mg/dL (ref 0.60–1.00)
Globulin: 2.4 g/dL (calc) (ref 1.9–3.7)
Glucose, Bld: 104 mg/dL — ABNORMAL HIGH (ref 65–99)
Potassium: 4.4 mmol/L (ref 3.5–5.3)
Sodium: 139 mmol/L (ref 135–146)
Total Bilirubin: 0.5 mg/dL (ref 0.2–1.2)
Total Protein: 6.7 g/dL (ref 6.1–8.1)
eGFR: 79 mL/min/{1.73_m2} (ref >=60)

## 2022-06-18 LAB — LIPID PANEL
Cholesterol: 173 mg/dL (ref <200)
HDL: 79 mg/dL (ref >=50)
LDL Cholesterol (Calc): 81 mg/dL (calc)
Non-HDL Cholesterol (Calc): 94 mg/dL (calc) (ref <130)
Total CHOL/HDL Ratio: 2.2 (calc) (ref <5.0)
Triglycerides: 54 mg/dL (ref <150)

## 2022-06-18 LAB — HEMOGLOBIN A1C
Hgb A1c MFr Bld: 5.5 % of total Hgb (ref <5.7)
Mean Plasma Glucose: 111 mg/dL
eAG (mmol/L): 6.2 mmol/L

## 2022-06-18 LAB — TSH: TSH: 2.25 mIU/L (ref 0.40–4.50)

## 2022-07-03 ENCOUNTER — Other Ambulatory Visit: Payer: Self-pay | Admitting: Medical-Surgical

## 2022-07-03 DIAGNOSIS — I10 Essential (primary) hypertension: Secondary | ICD-10-CM

## 2022-07-04 DIAGNOSIS — J320 Chronic maxillary sinusitis: Secondary | ICD-10-CM | POA: Diagnosis not present

## 2022-07-04 DIAGNOSIS — J329 Chronic sinusitis, unspecified: Secondary | ICD-10-CM | POA: Insufficient documentation

## 2022-07-04 DIAGNOSIS — H04222 Epiphora due to insufficient drainage, left lacrimal gland: Secondary | ICD-10-CM | POA: Diagnosis not present

## 2022-07-04 DIAGNOSIS — H2513 Age-related nuclear cataract, bilateral: Secondary | ICD-10-CM | POA: Diagnosis not present

## 2022-07-04 DIAGNOSIS — H04552 Acquired stenosis of left nasolacrimal duct: Secondary | ICD-10-CM | POA: Diagnosis not present

## 2022-07-04 DIAGNOSIS — H02423 Myogenic ptosis of bilateral eyelids: Secondary | ICD-10-CM | POA: Diagnosis not present

## 2022-07-06 DIAGNOSIS — A310 Pulmonary mycobacterial infection: Secondary | ICD-10-CM | POA: Diagnosis not present

## 2022-07-06 DIAGNOSIS — R9389 Abnormal findings on diagnostic imaging of other specified body structures: Secondary | ICD-10-CM | POA: Diagnosis not present

## 2022-07-18 DIAGNOSIS — N3000 Acute cystitis without hematuria: Secondary | ICD-10-CM | POA: Diagnosis not present

## 2022-07-27 ENCOUNTER — Encounter: Payer: Self-pay | Admitting: General Practice

## 2022-08-01 ENCOUNTER — Other Ambulatory Visit: Payer: Self-pay | Admitting: Medical-Surgical

## 2022-08-04 ENCOUNTER — Other Ambulatory Visit: Payer: Self-pay | Admitting: Medical-Surgical

## 2022-08-04 DIAGNOSIS — I10 Essential (primary) hypertension: Secondary | ICD-10-CM

## 2022-08-05 ENCOUNTER — Other Ambulatory Visit: Payer: Self-pay | Admitting: Medical-Surgical

## 2022-08-05 DIAGNOSIS — E039 Hypothyroidism, unspecified: Secondary | ICD-10-CM

## 2022-09-13 DIAGNOSIS — M4326 Fusion of spine, lumbar region: Secondary | ICD-10-CM | POA: Diagnosis not present

## 2022-09-13 DIAGNOSIS — M961 Postlaminectomy syndrome, not elsewhere classified: Secondary | ICD-10-CM | POA: Diagnosis not present

## 2022-09-15 DIAGNOSIS — A310 Pulmonary mycobacterial infection: Secondary | ICD-10-CM | POA: Diagnosis not present

## 2022-09-15 DIAGNOSIS — R9389 Abnormal findings on diagnostic imaging of other specified body structures: Secondary | ICD-10-CM | POA: Diagnosis not present

## 2022-10-05 DIAGNOSIS — M25512 Pain in left shoulder: Secondary | ICD-10-CM | POA: Diagnosis not present

## 2022-10-05 DIAGNOSIS — M25562 Pain in left knee: Secondary | ICD-10-CM | POA: Diagnosis not present

## 2022-10-05 DIAGNOSIS — R1011 Right upper quadrant pain: Secondary | ICD-10-CM | POA: Diagnosis not present

## 2022-10-05 DIAGNOSIS — Z23 Encounter for immunization: Secondary | ICD-10-CM | POA: Diagnosis not present

## 2022-10-05 DIAGNOSIS — A310 Pulmonary mycobacterial infection: Secondary | ICD-10-CM | POA: Diagnosis not present

## 2022-10-05 DIAGNOSIS — R9389 Abnormal findings on diagnostic imaging of other specified body structures: Secondary | ICD-10-CM | POA: Diagnosis not present

## 2022-10-05 DIAGNOSIS — M12812 Other specific arthropathies, not elsewhere classified, left shoulder: Secondary | ICD-10-CM | POA: Diagnosis not present

## 2022-10-09 ENCOUNTER — Other Ambulatory Visit: Payer: Self-pay | Admitting: Medical-Surgical

## 2022-10-09 DIAGNOSIS — I10 Essential (primary) hypertension: Secondary | ICD-10-CM

## 2022-10-29 ENCOUNTER — Other Ambulatory Visit: Payer: Self-pay | Admitting: Medical-Surgical

## 2022-10-29 DIAGNOSIS — E039 Hypothyroidism, unspecified: Secondary | ICD-10-CM

## 2022-11-08 ENCOUNTER — Other Ambulatory Visit: Payer: Self-pay | Admitting: Family Medicine

## 2022-12-08 DIAGNOSIS — F418 Other specified anxiety disorders: Secondary | ICD-10-CM | POA: Diagnosis not present

## 2022-12-08 DIAGNOSIS — D649 Anemia, unspecified: Secondary | ICD-10-CM | POA: Diagnosis not present

## 2022-12-08 DIAGNOSIS — I10 Essential (primary) hypertension: Secondary | ICD-10-CM | POA: Diagnosis not present

## 2022-12-08 DIAGNOSIS — K219 Gastro-esophageal reflux disease without esophagitis: Secondary | ICD-10-CM | POA: Diagnosis not present

## 2022-12-08 DIAGNOSIS — E039 Hypothyroidism, unspecified: Secondary | ICD-10-CM | POA: Diagnosis not present

## 2022-12-08 DIAGNOSIS — J4489 Other specified chronic obstructive pulmonary disease: Secondary | ICD-10-CM | POA: Diagnosis not present

## 2022-12-19 ENCOUNTER — Encounter: Payer: Self-pay | Admitting: Medical-Surgical

## 2022-12-19 ENCOUNTER — Ambulatory Visit (INDEPENDENT_AMBULATORY_CARE_PROVIDER_SITE_OTHER): Payer: Medicare HMO | Admitting: Medical-Surgical

## 2022-12-19 VITALS — BP 128/73 | HR 75 | Resp 20 | Ht 60.0 in | Wt 122.6 lb

## 2022-12-19 DIAGNOSIS — G8929 Other chronic pain: Secondary | ICD-10-CM

## 2022-12-19 DIAGNOSIS — R739 Hyperglycemia, unspecified: Secondary | ICD-10-CM

## 2022-12-19 DIAGNOSIS — E039 Hypothyroidism, unspecified: Secondary | ICD-10-CM

## 2022-12-19 DIAGNOSIS — R109 Unspecified abdominal pain: Secondary | ICD-10-CM | POA: Diagnosis not present

## 2022-12-19 DIAGNOSIS — L658 Other specified nonscarring hair loss: Secondary | ICD-10-CM

## 2022-12-19 DIAGNOSIS — F418 Other specified anxiety disorders: Secondary | ICD-10-CM | POA: Diagnosis not present

## 2022-12-19 DIAGNOSIS — E559 Vitamin D deficiency, unspecified: Secondary | ICD-10-CM | POA: Diagnosis not present

## 2022-12-19 DIAGNOSIS — D509 Iron deficiency anemia, unspecified: Secondary | ICD-10-CM | POA: Diagnosis not present

## 2022-12-19 DIAGNOSIS — N289 Disorder of kidney and ureter, unspecified: Secondary | ICD-10-CM

## 2022-12-19 DIAGNOSIS — I10 Essential (primary) hypertension: Secondary | ICD-10-CM

## 2022-12-19 NOTE — Progress Notes (Signed)
Established Patient Office Visit  Subjective   Patient ID: Margaret [name], female   DOB: September 12, 1946 Age: 77 y.o. MRN: [id number]   Chief Complaint  Patient presents with   Follow-up   Hypertension   HPI Very pleasant 77 year old female presenting today for the following:  Hypertension: Taking amlodipine 10 mg daily, tolerating well without side effects.  Was previously checking blood pressures at home however when she went to the beach recently, she left her blood pressure cuff there.  Follows a low-sodium diet and is working to stay physically active. Denies CP, SOB, palpitations, lower extremity edema, dizziness, headaches, or vision changes.  Hypothyroidism: Taking levothyroxine 50 mcg daily, tolerating well without side effects.  No recent skin/hair/nail changes, weight fluctuations, palpitations, GI changes, or worsened anxiety.  Female pattern hair loss: Taking finasteride 2.5 mg daily since July.  Has not seen new hair growth yet but is hopeful that she will start to notice over the next few months.  Chronic pain: Has previously been seen by the pain clinic but is no longer being followed by them.  She does have significant issues with chronic back pain despite having surgery.  Reports that she uses CBD oil/Gummies which helps with her discomfort some but when it severe, she usually takes one half of a tablet of hydrocodone-APAP 10-325 mg.  Never takes more than one half of a tablet per day and does not take it on a daily basis.  Her last prescription was obtained approximately 10 months ago and she still has some of the bottle left.  Would like to have her pain medication managed here rather than going back to the pain clinic if possible.  Also taking meloxicam 7.5 mg daily which seems to help.   Objective:    Vitals:   12/19/22 1103  BP: 128/73  Pulse: 75  Resp: 20  Height: 5' (1.524 m)  Weight: 122 lb 9.6 oz (55.6 kg)  SpO2: 97%  BMI (Calculated): 23.94    Physical  Exam Vitals and nursing note reviewed.  Constitutional:      General: She is not in acute distress.    Appearance: Normal appearance. She is not ill-appearing.  HENT:     Head: Normocephalic and atraumatic.  Cardiovascular:     Rate and Rhythm: Normal rate and regular rhythm.     Pulses: Normal pulses.     Heart sounds: Normal heart sounds.  Pulmonary:     Effort: Pulmonary effort is normal. No respiratory distress.     Breath sounds: Normal breath sounds. No wheezing, rhonchi or rales.  Skin:    General: Skin is warm and dry.  Neurological:     Mental Status: She is alert and oriented to person, place, and time.  Psychiatric:        Mood and Affect: Mood normal.        Behavior: Behavior normal.        Thought Content: Thought content normal.        Judgment: Judgment normal.      No results found for this or any previous visit (from the past 24 hour(s)).     The 10-year ASCVD risk score (Arnett DK, et al., 2019) is: 20.9%   Values used to calculate the score:     Age: 36 years     Sex: Female     Is Non-Hispanic African American: No     Diabetic: No     Tobacco smoker: No     Systolic  Blood Pressure: 128 mmHg     Is BP treated: Yes     HDL Cholesterol: 79 mg/dL     Total Cholesterol: 173 mg/dL   Assessment & Plan:   1. Essential hypertension, benign Checking labs as below.  Blood pressure at goal today.  Continue amlodipine 10 mg daily. - CBC with Differential/Platelet - COMPLETE METABOLIC PANEL WITH GFR - Lipid panel  2. Hypothyroidism, unspecified type Checking TSH.  Continue levothyroxine 50 mcg daily, titrate depending on results. - TSH  3. Renal insufficiency Checking CMP today. - COMPLETE METABOLIC PANEL WITH GFR  4. Depression with anxiety Stable.  Continue fluoxetine 20 mg daily.  5. Female pattern hair loss Continue finasteride 2.5 mg daily.  6. Iron deficiency anemia, unspecified iron deficiency anemia type Checking CBC and iron panel  today. - CBC with Differential/Platelet - Iron, TIBC and Ferritin Panel  7. Hyperglycemia Checking hemoglobin A1c. - Hemoglobin A1c  8. Vitamin D insufficiency Checking vitamin D. - VITAMIN D 25 Hydroxy (Vit-D Deficiency, Fractures)  9. Left flank pain Seen at urgent care while at the beach and treated with an antibiotic.  Continues to have some residual left flank pain and would like to have her urine rechecked for clearance.  Urinalysis ordered. - Urinalysis, Routine w reflex microscopic  10. Other chronic pain Discussed the recommendation for avoidance of chronic narcotics.  She has been through multiple pain management options and uses Norco very responsibly.  Discussed avoidance of CBD oil/Gummies that contain THC as these should not be used in conjunction with narcotics.  A opioid contract was completed and signed during our appointment.  Drug testing to be completed today.  Patient is aware to avoid narcotics prescriptions from other providers unless this is in relation to a surgical procedure, urgent care, or emergency department. - DRUG MONITORING, PANEL 7 WITH CONFIRMATION, URINE   Return in about 6 months (around 06/19/2023) for chronic disease follow up.  ___________________________________________ Clearnce Sorrel, DNP, APRN, FNP-BC Primary Care and [place]

## 2022-12-22 DIAGNOSIS — I10 Essential (primary) hypertension: Secondary | ICD-10-CM | POA: Diagnosis not present

## 2022-12-22 DIAGNOSIS — G8929 Other chronic pain: Secondary | ICD-10-CM | POA: Diagnosis not present

## 2022-12-22 DIAGNOSIS — E039 Hypothyroidism, unspecified: Secondary | ICD-10-CM | POA: Diagnosis not present

## 2022-12-22 DIAGNOSIS — D649 Anemia, unspecified: Secondary | ICD-10-CM | POA: Diagnosis not present

## 2022-12-22 DIAGNOSIS — H04552 Acquired stenosis of left nasolacrimal duct: Secondary | ICD-10-CM | POA: Insufficient documentation

## 2022-12-22 DIAGNOSIS — K219 Gastro-esophageal reflux disease without esophagitis: Secondary | ICD-10-CM | POA: Diagnosis not present

## 2022-12-22 DIAGNOSIS — A312 Disseminated mycobacterium avium-intracellulare complex (DMAC): Secondary | ICD-10-CM | POA: Diagnosis not present

## 2022-12-22 DIAGNOSIS — J449 Chronic obstructive pulmonary disease, unspecified: Secondary | ICD-10-CM | POA: Diagnosis not present

## 2022-12-22 DIAGNOSIS — H04222 Epiphora due to insufficient drainage, left lacrimal gland: Secondary | ICD-10-CM | POA: Diagnosis not present

## 2022-12-22 LAB — COMPLETE METABOLIC PANEL WITH GFR
AG Ratio: 1.8 (calc) (ref 1.0–2.5)
ALT: 19 U/L (ref 6–29)
AST: 25 U/L (ref 10–35)
Albumin: 4.8 g/dL (ref 3.6–5.1)
Alkaline phosphatase (APISO): 100 U/L (ref 37–153)
BUN/Creatinine Ratio: 30 (calc) — ABNORMAL HIGH (ref 6–22)
BUN: 27 mg/dL — ABNORMAL HIGH (ref 7–25)
CO2: 28 mmol/L (ref 20–32)
Calcium: 9.9 mg/dL (ref 8.6–10.4)
Chloride: 103 mmol/L (ref 98–110)
Creat: 0.91 mg/dL (ref 0.60–1.00)
Globulin: 2.7 g/dL (calc) (ref 1.9–3.7)
Glucose, Bld: 95 mg/dL (ref 65–99)
Potassium: 4.8 mmol/L (ref 3.5–5.3)
Sodium: 138 mmol/L (ref 135–146)
Total Bilirubin: 0.5 mg/dL (ref 0.2–1.2)
Total Protein: 7.5 g/dL (ref 6.1–8.1)
eGFR: 66 mL/min/{1.73_m2} (ref >=60)

## 2022-12-22 LAB — LIPID PANEL
Cholesterol: 207 mg/dL — ABNORMAL HIGH (ref <200)
HDL: 92 mg/dL (ref >=50)
LDL Cholesterol (Calc): 91 mg/dL (calc)
Non-HDL Cholesterol (Calc): 115 mg/dL (calc) (ref <130)
Total CHOL/HDL Ratio: 2.3 (calc) (ref <5.0)
Triglycerides: 139 mg/dL (ref <150)

## 2022-12-22 LAB — DRUG MONITORING, PANEL 7 WITH CONFIRMATION, URINE
6 Acetylmorphine: NEGATIVE ng/mL (ref <10)
Alcohol Metabolites: POSITIVE ng/mL — AB (ref <500)
Amphetamines: NEGATIVE ng/mL (ref <500)
Barbiturates: NEGATIVE ng/mL (ref <300)
Benzodiazepines: NEGATIVE ng/mL (ref <100)
Cocaine Metabolite: NEGATIVE ng/mL (ref <150)
Codeine: NEGATIVE ng/mL (ref <50)
Creatinine: 130.1 mg/dL (ref >=20.0)
Ethyl Glucuronide (ETG): >10000 ng/mL — ABNORMAL HIGH (ref <500)
Ethyl Sulfate (ETS): 7617 ng/mL — ABNORMAL HIGH (ref <100)
Hydrocodone: 939 ng/mL — ABNORMAL HIGH (ref <50)
Hydromorphone: NEGATIVE ng/mL (ref <50)
Marijuana Metabolite: 10 ng/mL — ABNORMAL HIGH (ref <5)
Marijuana Metabolite: POSITIVE ng/mL — AB (ref <20)
Methadone Metabolite: NEGATIVE ng/mL (ref <100)
Morphine: NEGATIVE ng/mL (ref <50)
Norhydrocodone: 1389 ng/mL — ABNORMAL HIGH (ref <50)
Opiates: POSITIVE ng/mL — AB (ref <100)
Oxidant: NEGATIVE ug/mL (ref <200)
Oxycodone: NEGATIVE ng/mL (ref <100)
pH: 6.3 (ref 4.5–9.0)

## 2022-12-22 LAB — CBC WITH DIFFERENTIAL/PLATELET
Absolute Monocytes: 537 cells/uL (ref 200–950)
Basophils Absolute: 26 cells/uL (ref 0–200)
Basophils Relative: 0.3 %
Eosinophils Absolute: 132 cells/uL (ref 15–500)
Eosinophils Relative: 1.5 %
HCT: 41.8 % (ref 35.0–45.0)
Hemoglobin: 14.2 g/dL (ref 11.7–15.5)
Lymphs Abs: 1540 cells/uL (ref 850–3900)
MCH: 33.1 pg — ABNORMAL HIGH (ref 27.0–33.0)
MCHC: 34 g/dL (ref 32.0–36.0)
MCV: 97.4 fL (ref 80.0–100.0)
MPV: 10.6 fL (ref 7.5–12.5)
Monocytes Relative: 6.1 %
Neutro Abs: 6565 cells/uL (ref 1500–7800)
Neutrophils Relative %: 74.6 %
Platelets: 265 10*3/uL (ref 140–400)
RBC: 4.29 10*6/uL (ref 3.80–5.10)
RDW: 12.1 % (ref 11.0–15.0)
Total Lymphocyte: 17.5 %
WBC: 8.8 10*3/uL (ref 3.8–10.8)

## 2022-12-22 LAB — URINALYSIS, ROUTINE W REFLEX MICROSCOPIC
Bilirubin Urine: NEGATIVE
Glucose, UA: NEGATIVE
Hgb urine dipstick: NEGATIVE
Ketones, ur: NEGATIVE
Leukocytes,Ua: NEGATIVE
Nitrite: NEGATIVE
Protein, ur: NEGATIVE
Specific Gravity, Urine: 1.024 (ref 1.001–1.035)
pH: 6 (ref 5.0–8.0)

## 2022-12-22 LAB — TSH: TSH: 2.8 mIU/L (ref 0.40–4.50)

## 2022-12-22 LAB — IRON,TIBC AND FERRITIN PANEL
%SAT: 23 % (calc) (ref 16–45)
Ferritin: 26 ng/mL (ref 16–288)
Iron: 95 ug/dL (ref 45–160)
TIBC: 412 mcg/dL (calc) (ref 250–450)

## 2022-12-22 LAB — DM TEMPLATE

## 2022-12-22 LAB — HEMOGLOBIN A1C
Hgb A1c MFr Bld: 5.5 % of total Hgb (ref <5.7)
Mean Plasma Glucose: 111 mg/dL
eAG (mmol/L): 6.2 mmol/L

## 2022-12-22 LAB — VITAMIN D 25 HYDROXY (VIT D DEFICIENCY, FRACTURES): Vit D, 25-Hydroxy: 37 ng/mL (ref 30–100)

## 2023-01-04 DIAGNOSIS — L818 Other specified disorders of pigmentation: Secondary | ICD-10-CM | POA: Diagnosis not present

## 2023-01-04 DIAGNOSIS — H04552 Acquired stenosis of left nasolacrimal duct: Secondary | ICD-10-CM | POA: Diagnosis not present

## 2023-01-04 DIAGNOSIS — H04222 Epiphora due to insufficient drainage, left lacrimal gland: Secondary | ICD-10-CM | POA: Diagnosis not present

## 2023-01-04 DIAGNOSIS — R234 Changes in skin texture: Secondary | ICD-10-CM | POA: Diagnosis not present

## 2023-01-05 DIAGNOSIS — M961 Postlaminectomy syndrome, not elsewhere classified: Secondary | ICD-10-CM | POA: Diagnosis not present

## 2023-01-05 DIAGNOSIS — M4326 Fusion of spine, lumbar region: Secondary | ICD-10-CM | POA: Diagnosis not present

## 2023-01-08 ENCOUNTER — Other Ambulatory Visit: Payer: Self-pay | Admitting: Medical-Surgical

## 2023-01-08 DIAGNOSIS — I10 Essential (primary) hypertension: Secondary | ICD-10-CM

## 2023-01-28 ENCOUNTER — Other Ambulatory Visit: Payer: Self-pay | Admitting: Medical-Surgical

## 2023-01-28 DIAGNOSIS — E039 Hypothyroidism, unspecified: Secondary | ICD-10-CM

## 2023-02-04 ENCOUNTER — Other Ambulatory Visit: Payer: Self-pay | Admitting: Medical-Surgical

## 2023-02-14 DIAGNOSIS — H04222 Epiphora due to insufficient drainage, left lacrimal gland: Secondary | ICD-10-CM | POA: Diagnosis not present

## 2023-02-14 DIAGNOSIS — H04552 Acquired stenosis of left nasolacrimal duct: Secondary | ICD-10-CM | POA: Diagnosis not present

## 2023-02-14 DIAGNOSIS — R234 Changes in skin texture: Secondary | ICD-10-CM | POA: Diagnosis not present

## 2023-02-14 DIAGNOSIS — L818 Other specified disorders of pigmentation: Secondary | ICD-10-CM | POA: Diagnosis not present

## 2023-02-27 ENCOUNTER — Telehealth: Payer: Self-pay | Admitting: Medical-Surgical

## 2023-02-27 NOTE — Telephone Encounter (Signed)
Called patient to schedule Medicare Annual Wellness Visit (AWV). No voicemail available to leave a message.  Last date of AWV: Never  Please schedule an appointment at any time with [facility].  If any questions, please contact me at [phone].  Thank you ,  Lin Givens Patient Access Advocate II Direct Dial: [phone]

## 2023-04-10 ENCOUNTER — Other Ambulatory Visit: Payer: Self-pay | Admitting: Medical-Surgical

## 2023-04-10 DIAGNOSIS — I10 Essential (primary) hypertension: Secondary | ICD-10-CM

## 2023-04-30 ENCOUNTER — Other Ambulatory Visit: Payer: Self-pay | Admitting: Medical-Surgical

## 2023-04-30 DIAGNOSIS — E039 Hypothyroidism, unspecified: Secondary | ICD-10-CM

## 2023-05-13 ENCOUNTER — Other Ambulatory Visit: Payer: Self-pay | Admitting: Medical-Surgical

## 2023-06-04 ENCOUNTER — Other Ambulatory Visit: Payer: Self-pay | Admitting: Medical-Surgical

## 2023-06-04 DIAGNOSIS — L658 Other specified nonscarring hair loss: Secondary | ICD-10-CM

## 2023-06-09 ENCOUNTER — Other Ambulatory Visit: Payer: Self-pay | Admitting: Medical-Surgical

## 2023-06-19 ENCOUNTER — Ambulatory Visit: Payer: Medicare HMO | Admitting: Medical-Surgical

## 2023-06-21 DIAGNOSIS — Z1330 Encounter for screening examination for mental health and behavioral disorders, unspecified: Secondary | ICD-10-CM | POA: Diagnosis not present

## 2023-06-21 DIAGNOSIS — R9389 Abnormal findings on diagnostic imaging of other specified body structures: Secondary | ICD-10-CM | POA: Diagnosis not present

## 2023-06-21 DIAGNOSIS — A310 Pulmonary mycobacterial infection: Secondary | ICD-10-CM | POA: Diagnosis not present

## 2023-06-29 ENCOUNTER — Encounter: Payer: Self-pay | Admitting: Medical-Surgical

## 2023-06-29 ENCOUNTER — Ambulatory Visit (INDEPENDENT_AMBULATORY_CARE_PROVIDER_SITE_OTHER): Payer: Medicare HMO | Admitting: Medical-Surgical

## 2023-06-29 VITALS — BP 138/81 | HR 75 | Resp 20 | Ht 60.0 in | Wt 120.1 lb

## 2023-06-29 DIAGNOSIS — R519 Headache, unspecified: Secondary | ICD-10-CM | POA: Diagnosis not present

## 2023-06-29 MED ORDER — CARBAMAZEPINE ER 100 MG PO TB12: 100.0000 mg | ORAL_TABLET | Freq: Two times a day (BID) | ORAL | 1 refills | Status: DC

## 2023-06-29 MED ORDER — PREDNISONE 50 MG PO TABS: 50.0000 mg | ORAL_TABLET | Freq: Every day | ORAL | 0 refills | Status: DC

## 2023-06-29 NOTE — Progress Notes (Signed)
        Established patient visit  History, exam, impression, and plan:  1. Right sided facial pain Pleasant 77 year old female presenting today for evaluation of right-sided facial pain.  Notes that she has been having the symptoms off and on for the last couple of years however they have gotten progressively worse.  Over the last week the pain has been unbearable.  Notes that she is experiencing frequent electrical shock sensations on the right side of her face and her jaw, cheek, and extending up into her forehead/temple.  These pains have been waking her from sleep and she has not been able to get comfortable.  Having pain with speaking, touch, chewing, and certain movements.  Has tried using a CBD salve which may have helped a little.  No other interventions at home.  No recent trauma or injury.  Her dentist told her that her symptoms are not related to TMJ.  After discussion, feel that her symptoms are likely related to trigeminal neuralgia.  Plan to treat with a steroid burst and adding carbamazepine 100 mg twice daily.  With long-term recurrent symptoms, would like to get MRI of the brain for further evaluation.  Procedures performed this visit: None.  Return in about 4 weeks (around 07/27/2023) for facial pain/ chronic disease follow up.  __________________________________ Thayer Ohm, DNP, APRN, FNP-BC Primary Care and Sports Medicine [facility] [place]

## 2023-06-30 NOTE — Addendum Note (Signed)
Addended byChristen Butter on: 06/30/2023 06:03 PM   Modules accepted: Orders

## 2023-07-03 DIAGNOSIS — A310 Pulmonary mycobacterial infection: Secondary | ICD-10-CM | POA: Diagnosis not present

## 2023-07-06 ENCOUNTER — Other Ambulatory Visit: Payer: Self-pay | Admitting: Medical-Surgical

## 2023-07-10 ENCOUNTER — Encounter: Payer: Self-pay | Admitting: Medical-Surgical

## 2023-07-10 MED ORDER — CARBAMAZEPINE ER 200 MG PO TB12: 200.0000 mg | ORAL_TABLET | Freq: Two times a day (BID) | ORAL | 0 refills | Status: DC

## 2023-07-10 MED ORDER — PREDNISONE 20 MG PO TABS: ORAL_TABLET | ORAL | 0 refills | Status: AC

## 2023-07-11 NOTE — Telephone Encounter (Signed)
The authorization for the MRI was obtained & completed on 07/03/23. The imaging department was notified on that same day. I have sent a msg to imaging requesting on an update about scheduling the patient for the study.

## 2023-07-19 ENCOUNTER — Other Ambulatory Visit: Payer: Self-pay | Admitting: Medical-Surgical

## 2023-07-19 DIAGNOSIS — I10 Essential (primary) hypertension: Secondary | ICD-10-CM

## 2023-07-24 ENCOUNTER — Ambulatory Visit (INDEPENDENT_AMBULATORY_CARE_PROVIDER_SITE_OTHER): Payer: Medicare HMO

## 2023-07-24 DIAGNOSIS — R519 Headache, unspecified: Secondary | ICD-10-CM | POA: Diagnosis not present

## 2023-07-24 DIAGNOSIS — J329 Chronic sinusitis, unspecified: Secondary | ICD-10-CM | POA: Diagnosis not present

## 2023-07-24 MED ORDER — GADOBUTROL 1 MMOL/ML IV SOLN
5.5000 mL | Freq: Once | INTRAVENOUS | Status: AC | PRN
  Administered 2023-07-24: 5.5 mL via INTRAVENOUS

## 2023-07-25 DIAGNOSIS — A310 Pulmonary mycobacterial infection: Secondary | ICD-10-CM | POA: Diagnosis not present

## 2023-07-25 DIAGNOSIS — J984 Other disorders of lung: Secondary | ICD-10-CM | POA: Diagnosis not present

## 2023-07-25 DIAGNOSIS — R918 Other nonspecific abnormal finding of lung field: Secondary | ICD-10-CM | POA: Diagnosis not present

## 2023-07-25 DIAGNOSIS — I2510 Atherosclerotic heart disease of native coronary artery without angina pectoris: Secondary | ICD-10-CM | POA: Diagnosis not present

## 2023-07-27 ENCOUNTER — Ambulatory Visit (INDEPENDENT_AMBULATORY_CARE_PROVIDER_SITE_OTHER): Payer: Medicare HMO | Admitting: Medical-Surgical

## 2023-07-27 ENCOUNTER — Encounter: Payer: Self-pay | Admitting: Medical-Surgical

## 2023-07-27 ENCOUNTER — Other Ambulatory Visit: Payer: Self-pay | Admitting: Medical-Surgical

## 2023-07-27 VITALS — BP 137/88 | HR 70 | Ht 60.0 in | Wt 123.0 lb

## 2023-07-27 DIAGNOSIS — Z23 Encounter for immunization: Secondary | ICD-10-CM

## 2023-07-27 DIAGNOSIS — R519 Headache, unspecified: Secondary | ICD-10-CM

## 2023-07-27 DIAGNOSIS — G9389 Other specified disorders of brain: Secondary | ICD-10-CM

## 2023-07-27 MED ORDER — CARBAMAZEPINE ER 200 MG PO TB12: 200.0000 mg | ORAL_TABLET | Freq: Two times a day (BID) | ORAL | 2 refills | Status: DC

## 2023-07-27 NOTE — Progress Notes (Signed)
        Established patient visit  History, exam, impression, and plan:  1. Right sided facial pain Pleasant 77 year old female presenting today for follow-up on right-sided facial pain.  Approximately 4 weeks ago, she presented with severe right-sided facial pain that affected her forehead, cheek, and jaw.  This was suspected to be trigeminal neuralgia and she was started on carbamazepine.  Initially experienced some significant dizziness with the medication but this quickly resolved.  She is now on 200 mg twice daily and is doing well.  Notes the forehead and cheek pain has fully resolved.  She does have intermittent episodes of jaw pain that seem to be exacerbated by chewing and talking.  Feels that the medication is doing well to manage her symptoms and that there is no need for increase in the medication.  An MRI was ordered for further evaluation given the chronic nature of these symptoms.  This has been completed but the report has not become available yet.  For now, continue carbamazepine 200 mg twice daily as prescribed.  Plan to expedite the MRI read to help with further treatment recommendations.  Procedures performed this visit: None.  Return in about 6 months (around 01/27/2024) for chronic disease follow up.  __________________________________ Thayer Ohm, DNP, APRN, FNP-BC Primary Care and Sports Medicine [facility] [place]

## 2023-08-01 DIAGNOSIS — G500 Trigeminal neuralgia: Secondary | ICD-10-CM | POA: Diagnosis not present

## 2023-08-01 DIAGNOSIS — G939 Disorder of brain, unspecified: Secondary | ICD-10-CM | POA: Diagnosis not present

## 2023-08-03 ENCOUNTER — Other Ambulatory Visit: Payer: Self-pay | Admitting: Medical-Surgical

## 2023-08-03 DIAGNOSIS — E039 Hypothyroidism, unspecified: Secondary | ICD-10-CM

## 2023-08-04 ENCOUNTER — Encounter: Payer: Self-pay | Admitting: Physician Assistant

## 2023-08-04 ENCOUNTER — Ambulatory Visit (INDEPENDENT_AMBULATORY_CARE_PROVIDER_SITE_OTHER): Payer: Medicare HMO | Admitting: Physician Assistant

## 2023-08-04 VITALS — BP 133/88 | HR 73 | Ht 60.0 in | Wt 120.8 lb

## 2023-08-04 DIAGNOSIS — U071 COVID-19: Secondary | ICD-10-CM | POA: Diagnosis not present

## 2023-08-04 DIAGNOSIS — F418 Other specified anxiety disorders: Secondary | ICD-10-CM | POA: Diagnosis not present

## 2023-08-04 MED ORDER — FLUOXETINE HCL 20 MG PO CAPS: 20.0000 mg | ORAL_CAPSULE | Freq: Every day | ORAL | 0 refills | Status: DC

## 2023-08-04 MED ORDER — MOLNUPIRAVIR EUA 200MG CAPSULE: 4.0000 | ORAL_CAPSULE | Freq: Two times a day (BID) | ORAL | 0 refills | Status: AC

## 2023-08-04 NOTE — Progress Notes (Signed)
Acute Office Visit  Subjective:     Patient ID: Margaret [name], female    DOB: Jun 03, 1946, 77 y.o.   MRN: [id number]  Chief Complaint  Patient presents with   Cough    Coughing and congested    HPI Patient is in today for URI symptoms with tons of fatigue, coughing and congestion since Tuesday, 4 days ago.  She tested positive for covid with a home test yesterday. She has had covid before and did have to go to hospital for IV antiviral treatment. She has had 2 covid vaccines but no boosters. She denies any fever, chills. She is achy and tired. No abdominal pain. She has not taken any medication to make better.   .. Active Ambulatory Problems    Diagnosis Date Noted   Essential hypertension, benign 01/31/2013   Osteoporosis 01/31/2013   HSV infection 01/31/2013   Hyperglycemia 02/01/2013   Diverticulosis of colon without hemorrhage 02/22/2013   Disorder of bursae and tendons in shoulder region 03/01/2013   Coffee ground emesis 03/06/2013   Right-sided low back pain post T10-S1 fusion 10/14/2013   Vasomotor flushing 03/05/2014   Hypothyroidism 07/24/2014   Primary osteoarthritis of right hip 08/21/2014   Osteoarthritis of right knee 11/04/2014   Hirsutism 01/28/2015   Vitamin D insufficiency 04/08/2015   Elevated vitamin B12 level 04/08/2015   Depression with anxiety 09/29/2015   Strain of gluteus medius 10/07/2015   Thyroid nodule 03/30/2016   Solitary pulmonary nodule 03/30/2016   Other chronic pain 11/03/2016   Pulmonary nodules 12/26/2016   Iron deficiency anemia 05/31/2017   H/O total knee replacement, left 10/23/2017   Acute cystitis 04/23/2018   Mild persistent asthma with acute exacerbation 02/01/2019   Renal insufficiency 02/01/2019   COVID-19 07/16/2020   Mycobacterium avium complex (HCC) 10/07/2020   DJD of right shoulder 07/07/2017   Weight loss 06/16/2022   Female pattern hair loss 08/21/2014   Acquired trigger finger 07/15/2019   Anemia 08/15/2017    Complete tear of right rotator cuff 11/07/2016   HTN (hypertension) 11/23/2020   Sinusitis 07/04/2022   Spondylolisthesis at L4-L5 level 03/12/2014   Syringoma of eyelid 09/16/2018   Acquired stenosis of left nasolacrimal duct 12/22/2022   Carpal tunnel syndrome 05/16/2012   Tendonitis of wrist, right 05/16/2012   Resolved Ambulatory Problems    Diagnosis Date Noted   CAP (community acquired pneumonia) 01/31/2013   Anal fissure 02/22/2013   Intrinsic asthma 12/17/2013   Right knee DJD 12/17/2014   Greater trochanteric bursitis 06/16/2015   Hip pain, acute 08/31/2015   Shoulder pain, right 12/25/2016   Constipation due to opioid therapy 06/21/2018   Chronic obstructive pulmonary disease (HCC) 08/15/2017   Past Medical History:  Diagnosis Date   Anxiety    Arthritis    Asthma    Cancer (HCC)    COPD (chronic obstructive pulmonary disease) (HCC)    Emphysema of lung (HCC)    GERD (gastroesophageal reflux disease)    Hypertension    Thyroid disease      ROS See HPI.      Objective:    BP 133/88   Pulse 73   Ht 5' (1.524 m)   Wt 120 lb 12 oz (54.8 kg)   SpO2 95%   BMI 23.58 kg/m  BP Readings from Last 3 Encounters:  08/04/23 133/88  07/27/23 137/88  06/29/23 138/81   Wt Readings from Last 3 Encounters:  08/04/23 120 lb 12 oz (54.8 kg)  07/27/23 123 lb 0.6  oz (55.8 kg)  06/29/23 120 lb 1.3 oz (54.5 kg)      Physical Exam Constitutional:      Appearance: Normal appearance.     Comments: Pale and weak appearing  HENT:     Head: Normocephalic.     Comments: No sinus tenderness to palpation    Right Ear: Tympanic membrane, ear canal and external ear normal. There is no impacted cerumen.     Left Ear: Tympanic membrane, ear canal and external ear normal. There is no impacted cerumen.     Nose: Congestion present.     Mouth/Throat:     Mouth: Mucous membranes are moist.     Pharynx: Posterior oropharyngeal erythema present. No oropharyngeal exudate.  Eyes:      Conjunctiva/sclera: Conjunctivae normal.  Cardiovascular:     Rate and Rhythm: Normal rate and regular rhythm.     Pulses: Normal pulses.     Heart sounds: Normal heart sounds.  Pulmonary:     Effort: Pulmonary effort is normal.     Breath sounds: Normal breath sounds.  Musculoskeletal:     Cervical back: Normal range of motion and neck supple.     Right lower leg: No edema.     Left lower leg: No edema.  Neurological:     General: No focal deficit present.     Mental Status: She is alert and oriented to person, place, and time.  Psychiatric:        Mood and Affect: Mood normal.          Assessment & Plan:  Marland KitchenMarland KitchenAayushi "Dorothyann Gibbs" was seen today for cough.  Diagnoses and all orders for this visit:  COVID-19 -     molnupiravir EUA (LAGEVRIO) 200 mg CAPS capsule; Take 4 capsules (800 mg total) by mouth 2 (two) times daily for 5 days.  Depression with anxiety -     FLUoxetine (PROZAC) 20 MG capsule; Take 1 capsule (20 mg total) by mouth daily.   Day 4 of Covid Molnupiravir sent to pharmacy due to medication interaction of paxlovid HO given Discussed symptomatic care Vitals reassuring Red flag symptoms discussed and if she develops these to go to ER or UC Rest and hydrate Quarantine for 5 days then wear a mask for another 5 days if symptomatic Follow up as needed or if symptoms persist, change or worsen  Pt is doing well and needs prozac refilled     Return if symptoms worsen or fail to improve.  Tandy Gaw, PA-C

## 2023-08-04 NOTE — Patient Instructions (Addendum)
Molnupiravir Capsules What is this medication? MOLNUPIRAVIR (MOL nue PIR a vir) treats mild to moderate COVID-19. It may help people who are at high risk of developing severe illness. This medication works by limiting the spread of the virus in your body. The FDA has allowed the emergency use of this medication. This medicine may be used for other purposes; ask your health care provider or pharmacist if you have questions. COMMON BRAND NAME(S): LAGEVRIO What should I tell my care team before I take this medication? They need to know if you have any of these conditions: Any allergies Any serious illness An unusual or allergic reaction to molnupiravir, other medications, foods, dyes, or preservatives Pregnant or trying to get pregnant Breast-feeding How should I use this medication? Take this medication by mouth with water. Take it as directed on the prescription label at the same time every day. Do not cut, crush, or chew this medication. Swallow the capsules whole. You can take it with or without food. If it upsets your stomach, take it with food. Take all of it unless your care team tells you to stop it early. Keep taking it even if you think you are better. Talk to your care team about the use of this medication in children. Special care may be needed. Overdosage: If you think you have taken too much of this medicine contact a poison control center or emergency room at once. NOTE: This medicine is only for you. Do not share this medicine with others. What if I miss a dose? If you miss a dose, take it as soon as you can unless it is more than 10 hours late. If it is more than 10 hours late, skip the missed dose. Take the next dose at the normal time. Do not take extra or 2 doses at the same time to make up for the missed dose. What may interact with this medication? Interactions have not been studied. This list may not describe all possible interactions. Give your health care provider a list of  all the medicines, herbs, non-prescription drugs, or dietary supplements you use. Also tell them if you smoke, drink alcohol, or use illegal drugs. Some items may interact with your medicine. What should I watch for while using this medication? Your condition will be monitored carefully while you are receiving this medication. Visit your care team for regular checkups. Tell your care team if your symptoms do not start to get better or if they get worse. Do not become pregnant while taking this medication. You may need a pregnancy test before starting this medication. Women must use a reliable form of birth control while taking this medication and for 4 days after stopping the medication. Women should inform their care team if they wish to become pregnant or think they might be pregnant. Men should not father a child while taking this medication and for 3 months after stopping it. There is potential for serious harm to an unborn child. Talk to your care team for more information. Do not breast-feed an infant while taking this medication and for 4 days after stopping the medication. What side effects may I notice from receiving this medication? Side effects that you should report to your care team as soon as possible: Allergic reactions--skin rash, itching, hives, swelling of the face, lips, tongue, or throat Side effects that usually do not require medical attention (report these to your care team if they continue or are bothersome): Diarrhea Dizziness Nausea This list may not  describe all possible side effects. Call your doctor for medical advice about side effects. You may report side effects to FDA at 1-800-FDA-1088. Where should I keep my medication? Keep out of the reach of children and pets. Store at room temperature between 20 and 25 degrees C (68 and 77 degrees F). Get rid of any unused medication after the expiration date. To get rid of medications that are no longer needed or have  expired: Take the medication to a medication take-back program. Check with your pharmacy or law enforcement to find a location. If you cannot return the medication, check the label or package insert to see if the medication should be thrown out in the garbage or flushed down the toilet. If you are not sure, ask your care team. If it is safe to put it in the trash, take the medication out of the container. Mix the medication with cat litter, dirt, coffee grounds, or other unwanted substance. Seal the mixture in a bag or container. Put it in the trash. NOTE: This sheet is a summary. It may not cover all possible information. If you have questions about this medicine, talk to your doctor, pharmacist, or health care provider.  2024 Elsevier/Gold Standard (2022-01-17 00:00:00)  COVID-19 COVID-19 is an infection caused by a virus called SARS-CoV-2. This type of virus is called a coronavirus. People with COVID-19 may: Have little to no symptoms. Have mild to moderate symptoms that affect their lungs and breathing. Get very sick. What are the causes? COVID-19 is caused by a virus. This virus may be in the air as droplets or on surfaces. It can spread from an infected person when they cough, sneeze, speak, sing, or breathe. You may become infected if: You breathe in the infected droplets in the air. You touch an object that has the virus on it. What increases the risk? You are at risk of getting COVID-19 if you have been around someone with the infection. You may be more likely to get very sick if: You are 70 years old or older. You have certain medical conditions, such as: Heart disease. Diabetes. Chronic respiratory disease. Cancer. Pregnancy. You are immunocompromised. This means your body cannot fight infections easily. You have a disability or trouble moving, meaning you're immobile. What are the signs or symptoms? People may have different symptoms from COVID-19. The symptoms can also be  mild to severe. They often show up in 5-6 days after being infected. But they can take up to 14 days to appear. Common symptoms are: Cough. Feeling tired. New loss of taste or smell. Fever. Less common symptoms are: Sore throat. Headache. Body or muscle aches. Diarrhea. A skin rash or odd-colored fingers or toes. Red or irritated eyes. Sometimes, COVID-19 does not cause symptoms. How is this diagnosed? COVID-19 can be diagnosed with tests done in the lab or at home. Fluid from your nose, mouth, or lungs will be used to check for the virus. How is this treated? Treatment for COVID-19 depends on how sick you are. Mild symptoms can be treated at home with rest, fluids, and over-the-counter medicines. Severe symptoms may be treated in a hospital intensive care unit (ICU). If you have symptoms and are at risk of getting very sick, you may be given a medicine that fights viruses. This medicine is called an antiviral. How is this prevented? To protect yourself from COVID-19: Know your risk factors. Get vaccinated. If your body cannot fight infections easily, talk to your provider about treatment to  help prevent COVID-19. Stay at least 1 meter away from others. Wear a well-fitted mask when: You can't stay at a distance from people. You're in a place with poor air flow. Try to be in open spaces with good air flow when in public. Wash your hands often or use an alcohol-based hand sanitizer. Cover your nose and mouth when coughing and sneezing. If you think you have COVID-19 or have been around someone who has it, stay home and be by yourself for 5-10 days. Where to find more information Centers for Disease Control and Prevention (CDC): [employer].no World Health Organization [facility]): VisitDestination.com.br Get help right away if: You have trouble breathing or get short of breath. You have pain or pressure in your chest. You cannot speak or move any part of your body. You are confused. Your symptoms get  worse. These symptoms may be an emergency. Get help right away. Call 911. Do not wait to see if the symptoms will go away. Do not drive yourself to the hospital. This information is not intended to replace advice given to you by your health care provider. Make sure you discuss any questions you have with your health care provider. Document Revised: 11/29/2022 Document Reviewed: 08/05/2022 Elsevier Patient Education  2024 [employer].

## 2023-08-06 ENCOUNTER — Other Ambulatory Visit: Payer: Self-pay | Admitting: Medical-Surgical

## 2023-08-10 ENCOUNTER — Encounter: Payer: Self-pay | Admitting: Medical-Surgical

## 2023-08-10 DIAGNOSIS — G500 Trigeminal neuralgia: Secondary | ICD-10-CM

## 2023-08-10 DIAGNOSIS — G9389 Other specified disorders of brain: Secondary | ICD-10-CM

## 2023-08-15 ENCOUNTER — Ambulatory Visit (INDEPENDENT_AMBULATORY_CARE_PROVIDER_SITE_OTHER): Payer: Medicare HMO | Admitting: Physician Assistant

## 2023-08-15 ENCOUNTER — Encounter: Payer: Self-pay | Admitting: Physician Assistant

## 2023-08-15 VITALS — BP 142/84 | HR 70 | Ht 60.0 in | Wt 121.0 lb

## 2023-08-15 DIAGNOSIS — I951 Orthostatic hypotension: Secondary | ICD-10-CM | POA: Diagnosis not present

## 2023-08-15 DIAGNOSIS — I10 Essential (primary) hypertension: Secondary | ICD-10-CM

## 2023-08-15 DIAGNOSIS — Z8616 Personal history of COVID-19: Secondary | ICD-10-CM | POA: Diagnosis not present

## 2023-08-15 MED ORDER — FLUDROCORTISONE ACETATE 0.1 MG PO TABS
0.1000 mg | ORAL_TABLET | Freq: Every day | ORAL | 1 refills | Status: DC
Start: 2023-08-15 — End: 2023-09-12

## 2023-08-15 MED ORDER — TRELEGY ELLIPTA 100-62.5-25 MCG/ACT IN AEPB: 1.0000 | INHALATION_SPRAY | Freq: Every day | RESPIRATORY_TRACT | 5 refills | Status: DC

## 2023-08-15 NOTE — Patient Instructions (Signed)
Orthostatic Hypotension Blood pressure is a measurement of how strongly, or weakly, your circulating blood is pressing against the walls of your arteries. Orthostatic hypotension is a drop in blood pressure that can happen when you change positions, such as when you go from lying down to standing. Arteries are blood vessels that carry blood from your heart throughout your body. When blood pressure is too low, you may not get enough blood to your brain or to the rest of your organs. Orthostatic hypotension can cause light-headedness, sweating, rapid heartbeat, blurred vision, and fainting. These symptoms require further investigation into the cause. What are the causes? Orthostatic hypotension can be caused by many things, including: Sudden changes in posture, such as standing up quickly after you have been sitting or lying down. Loss of blood (anemia) or loss of body fluids (dehydration). Heart problems, neurologic problems, or hormone problems. Pregnancy. Aging. The risk for this condition increases as you get older. Severe infection (sepsis). Certain medicines, such as medicines for high blood pressure or medicines that make the body lose excess fluids (diuretics). What are the signs or symptoms? Symptoms of this condition may include: Weakness, light-headedness, or dizziness. Sweating. Blurred vision. Tiredness (fatigue). Rapid heartbeat. Fainting, in severe cases. How is this diagnosed? This condition is diagnosed based on: Your symptoms and medical history. Your blood pressure measurements. Your health care provider will check your blood pressure when you are: Lying down. Sitting. Standing. A blood pressure reading is recorded as two numbers, such as "120 over 80" (or 120/80). The first ("top") number is called the systolic pressure. It is a measure of the pressure in your arteries as your heart beats. The second ("bottom") number is called the diastolic pressure. It is a measure of  the pressure in your arteries when your heart relaxes between beats. Blood pressure is measured in a unit called mmHg. Healthy blood pressure for most adults is 120/80 mmHg. Orthostatic hypotension is defined as a 20 mmHg drop in systolic pressure or a 10 mmHg drop in diastolic pressure within 3 minutes of standing. Other information or tests that may be used to diagnose orthostatic hypotension include: Your other vital signs, such as your heart rate and temperature. Blood tests. An electrocardiogram (ECG) or echocardiogram. A Holter monitor. This is a device you wear that records your heart rhythm continuously, usually for 24-48 hours. Tilt table test. For this test, you will be safely secured to a table that moves you from a lying position to an upright position. Your heart rhythm and blood pressure will be monitored during the test. How is this treated? This condition may be treated by: Changing your diet. This may involve eating more salt (sodium) or drinking more water. Changing the dosage of certain medicines you are taking that might be lowering your blood pressure. Correcting the underlying reason for the orthostatic hypotension. Wearing compression stockings. Taking medicines to raise your blood pressure. Avoiding actions that trigger symptoms. Follow these instructions at home: Medicines Take over-the-counter and prescription medicines only as told by your health care provider. Follow instructions from your health care provider about changing the dosage of your current medicines, if this applies. Do not stop or adjust any of your medicines on your own. Eating and drinking  Drink enough fluid to keep your urine pale yellow. Eat extra salt only as directed. Do not add extra salt to your diet unless advised by your health care provider. Eat frequent, small meals. Avoid standing up suddenly after eating. General instructions    Get up slowly from lying down or sitting positions. This  gives your blood pressure a chance to adjust. Avoid hot showers and excessive heat as directed by your health care provider. Engage in regular physical activity as directed by your health care provider. If you have compression stockings, wear them as told. Keep all follow-up visits. This is important. Contact a health care provider if: You have a fever for more than 2-3 days. You feel more thirsty than usual. You feel dizzy or weak. Get help right away if: You have chest pain. You have a fast or irregular heartbeat. You become sweaty or feel light-headed. You feel short of breath. You faint. You have any symptoms of a stroke. "BE FAST" is an easy way to remember the main warning signs of a stroke: B - Balance. Signs are dizziness, sudden trouble walking, or loss of balance. E - Eyes. Signs are trouble seeing or a sudden change in vision. F - Face. Signs are sudden weakness or numbness of the face, or the face or eyelid drooping on one side. A - Arms. Signs are weakness or numbness in an arm. This happens suddenly and usually on one side of the body. S - Speech. Signs are sudden trouble speaking, slurred speech, or trouble understanding what people say. T - Time. Time to call emergency services. Write down what time symptoms started. You have other signs of a stroke, such as: A sudden, severe headache with no known cause. Nausea or vomiting. Seizure. These symptoms may represent a serious problem that is an emergency. Do not wait to see if the symptoms will go away. Get medical help right away. Call your local emergency services (911 in the [place] Do not drive yourself to the hospital. Summary Orthostatic hypotension is a sudden drop in blood pressure. It can cause light-headedness, sweating, rapid heartbeat, blurred vision, and fainting. Orthostatic hypotension can be diagnosed by having your blood pressure taken while lying down, sitting, and then standing. Treatment may involve  changing your diet, wearing compression stockings, sitting up slowly, adjusting your medicines, or correcting the underlying reason for the orthostatic hypotension. Get help right away if you have chest pain, a fast or irregular heartbeat, or symptoms of a stroke. This information is not intended to replace advice given to you by your health care provider. Make sure you discuss any questions you have with your health care provider. Document Revised: 02/04/2021 Document Reviewed: 02/04/2021 Elsevier Patient Education  2024 Elsevier Inc.  

## 2023-08-16 ENCOUNTER — Encounter: Payer: Self-pay | Admitting: Physician Assistant

## 2023-08-16 LAB — CMP14+EGFR
ALT: 26 IU/L (ref 0–32)
AST: 23 IU/L (ref 0–40)
Albumin: 4.5 g/dL (ref 3.8–4.8)
Alkaline Phosphatase: 80 IU/L (ref 44–121)
BUN/Creatinine Ratio: 15 (ref 12–28)
BUN: 14 mg/dL (ref 8–27)
Bilirubin Total: 0.3 mg/dL (ref 0.0–1.2)
CO2: 25 mmol/L (ref 20–29)
Calcium: 9.9 mg/dL (ref 8.7–10.3)
Chloride: 99 mmol/L (ref 96–106)
Creatinine, Ser: 0.91 mg/dL (ref 0.57–1.00)
Globulin, Total: 2.3 g/dL (ref 1.5–4.5)
Glucose: 104 mg/dL — ABNORMAL HIGH (ref 70–99)
Potassium: 4.9 mmol/L (ref 3.5–5.2)
Sodium: 137 mmol/L (ref 134–144)
Total Protein: 6.8 g/dL (ref 6.0–8.5)
eGFR: 65 mL/min/{1.73_m2} (ref >59)

## 2023-08-16 LAB — CBC WITH DIFFERENTIAL/PLATELET
Basophils Absolute: 0 10*3/uL (ref 0.0–0.2)
Basos: 1 %
EOS (ABSOLUTE): 0.1 10*3/uL (ref 0.0–0.4)
Eos: 2 %
Hematocrit: 40.1 % (ref 34.0–46.6)
Hemoglobin: 13.2 g/dL (ref 11.1–15.9)
Immature Grans (Abs): 0.1 10*3/uL (ref 0.0–0.1)
Immature Granulocytes: 1 %
Lymphocytes Absolute: 1.2 10*3/uL (ref 0.7–3.1)
Lymphs: 15 %
MCH: 32.2 pg (ref 26.6–33.0)
MCHC: 32.9 g/dL (ref 31.5–35.7)
MCV: 98 fL — ABNORMAL HIGH (ref 79–97)
Monocytes Absolute: 0.7 10*3/uL (ref 0.1–0.9)
Monocytes: 10 %
Neutrophils Absolute: 5.5 10*3/uL (ref 1.4–7.0)
Neutrophils: 71 %
Platelets: 362 10*3/uL (ref 150–450)
RBC: 4.1 x10E6/uL (ref 3.77–5.28)
RDW: 12 % (ref 11.7–15.4)
WBC: 7.7 10*3/uL (ref 3.4–10.8)

## 2023-08-16 NOTE — Progress Notes (Unsigned)
Acute Office Visit  Subjective:     Patient ID: Margaret [name], female    DOB: [birth year], 77 y.o.   MRN: [id number]  Chief Complaint  Patient presents with  . Medical Management of Chronic Issues    HPI Patient is in today for ***  .[name] Active Ambulatory Problems    Diagnosis Date Noted  . Essential hypertension, benign 01/31/2013  . Osteoporosis 01/31/2013  . HSV infection 01/31/2013  . Hyperglycemia 02/01/2013  . Diverticulosis of colon without hemorrhage 02/22/2013  . Disorder of bursae and tendons in shoulder region 03/01/2013  . Coffee ground emesis 03/06/2013  . Right-sided low back pain post T10-S1 fusion 10/14/2013  . Vasomotor flushing 03/05/2014  . Hypothyroidism 07/24/2014  . Primary osteoarthritis of right hip 08/21/2014  . Osteoarthritis of right knee 11/04/2014  . Hirsutism 01/28/2015  . Vitamin D insufficiency 04/08/2015  . Elevated vitamin B12 level 04/08/2015  . Depression with anxiety 09/29/2015  . Strain of gluteus medius 10/07/2015  . Thyroid nodule 03/30/2016  . Solitary pulmonary nodule 03/30/2016  . Other chronic pain 11/03/2016  . Pulmonary nodules 12/26/2016  . Iron deficiency anemia 05/31/2017  . H/O total knee replacement, left 10/23/2017  . Acute cystitis 04/23/2018  . Mild persistent asthma with acute exacerbation 02/01/2019  . Renal insufficiency 02/01/2019  . COVID-19 07/16/2020  . Mycobacterium avium complex (HCC) 10/07/2020  . DJD of right shoulder 07/07/2017  . Weight loss 06/16/2022  . Female pattern hair loss 08/21/2014  . Acquired trigger finger 07/15/2019  . Anemia 08/15/2017  . Complete tear of right rotator cuff 11/07/2016  . HTN (hypertension) 11/23/2020  . Sinusitis 07/04/2022  . Spondylolisthesis at L4-L5 level 03/12/2014  . Syringoma of eyelid 09/16/2018  . Acquired stenosis of left nasolacrimal duct 12/22/2022  . Carpal tunnel syndrome 05/16/2012  . Tendonitis of wrist, right 05/16/2012   Resolved Ambulatory  Problems    Diagnosis Date Noted  . CAP (community acquired pneumonia) 01/31/2013  . Anal fissure 02/22/2013  . Intrinsic asthma 12/17/2013  . Right knee DJD 12/17/2014  . Greater trochanteric bursitis 06/16/2015  . Hip pain, acute 08/31/2015  . Shoulder pain, right 12/25/2016  . Constipation due to opioid therapy 06/21/2018  . Chronic obstructive pulmonary disease (HCC) 08/15/2017   Past Medical History:  Diagnosis Date  . Anxiety   . Arthritis   . Asthma   . Cancer (HCC)   . COPD (chronic obstructive pulmonary disease) (HCC)   . Emphysema of lung (HCC)   . GERD (gastroesophageal reflux disease)   . Hypertension   . Thyroid disease      ROS  See HPI.     Objective:    BP (!) 142/84 (BP Location: Left Arm)   Pulse 70   Ht 5' (1.524 m)   Wt 121 lb (54.9 kg)   SpO2 99%   BMI 23.63 kg/m  BP Readings from Last 3 Encounters:  08/15/23 (!) 142/84  08/04/23 133/88  07/27/23 137/88   Wt Readings from Last 3 Encounters:  08/15/23 121 lb (54.9 kg)  08/04/23 120 lb 12 oz (54.8 kg)  07/27/23 123 lb 0.6 oz (55.8 kg)    Physical Exam  Results for orders placed or performed in visit on 08/15/23  CMP14+EGFR  Result Value Ref Range   Glucose 104 (H) 70 - 99 mg/dL   BUN 14 8 - 27 mg/dL   Creatinine, Ser 3.87 0.57 - 1.00 mg/dL   eGFR 65 >56 EP/PIR/5.18   BUN/Creatinine Ratio 15 12 -  28   Sodium 137 134 - 144 mmol/L   Potassium 4.9 3.5 - 5.2 mmol/L   Chloride 99 96 - 106 mmol/L   CO2 25 20 - 29 mmol/L   Calcium 9.9 8.7 - 10.3 mg/dL   Total Protein 6.8 6.0 - 8.5 g/dL   Albumin 4.5 3.8 - 4.8 g/dL   Globulin, Total 2.3 1.5 - 4.5 g/dL   Bilirubin Total 0.3 0.0 - 1.2 mg/dL   Alkaline Phosphatase 80 44 - 121 IU/L   AST 23 0 - 40 IU/L   ALT 26 0 - 32 IU/L  CBC w/Diff/Platelet  Result Value Ref Range   WBC 7.7 3.4 - 10.8 x10E3/uL   RBC 4.10 3.77 - 5.28 x10E6/uL   Hemoglobin 13.2 11.1 - 15.9 g/dL   Hematocrit 63.0 16.0 - 46.6 %   MCV 98 (H) 79 - 97 fL   MCH 32.2 26.6  - 33.0 pg   MCHC 32.9 31.5 - 35.7 g/dL   RDW 10.9 32.3 - 55.7 %   Platelets 362 150 - 450 x10E3/uL   Neutrophils 71 Not Estab. %   Lymphs 15 Not Estab. %   Monocytes 10 Not Estab. %   Eos 2 Not Estab. %   Basos 1 Not Estab. %   Neutrophils Absolute 5.5 1.4 - 7.0 x10E3/uL   Lymphocytes Absolute 1.2 0.7 - 3.1 x10E3/uL   Monocytes Absolute 0.7 0.1 - 0.9 x10E3/uL   EOS (ABSOLUTE) 0.1 0.0 - 0.4 x10E3/uL   Basophils Absolute 0.0 0.0 - 0.2 x10E3/uL   Immature Granulocytes 1 Not Estab. %   Immature Grans (Abs) 0.1 0.0 - 0.1 x10E3/uL        Assessment & Plan:  Marland KitchenMarland KitchenPaysleigh "Dorothyann Gibbs" was seen today for medical management of chronic issues.  Diagnoses and all orders for this visit:  Orthostatic hypotension -     CMP14+EGFR -     fludrocortisone (FLORINEF) 0.1 MG tablet; Take 1 tablet (0.1 mg total) by mouth daily. -     CBC w/Diff/Platelet  Essential hypertension, benign -     CMP14+EGFR -     CBC w/Diff/Platelet  History of COVID-19 -     CMP14+EGFR -     CBC w/Diff/Platelet  Other orders -     TRELEGY ELLIPTA 100-62.5-25 MCG/ACT AEPB; Inhale 1 puff into the lungs daily. TAKE 1 PUFF BY MOUTH EVERY DAY        Return in about 2 weeks (around 08/29/2023) for PcP.  Tandy Gaw, PA-C

## 2023-08-16 NOTE — Progress Notes (Signed)
Kidney, liver, WBC and electrolytes look good. No concerning findings on labs.

## 2023-08-17 DIAGNOSIS — G9389 Other specified disorders of brain: Secondary | ICD-10-CM | POA: Diagnosis not present

## 2023-08-17 DIAGNOSIS — D320 Benign neoplasm of cerebral meninges: Secondary | ICD-10-CM | POA: Diagnosis not present

## 2023-08-17 DIAGNOSIS — D3610 Benign neoplasm of peripheral nerves and autonomic nervous system, unspecified: Secondary | ICD-10-CM | POA: Diagnosis not present

## 2023-08-17 DIAGNOSIS — G500 Trigeminal neuralgia: Secondary | ICD-10-CM | POA: Diagnosis not present

## 2023-08-17 DIAGNOSIS — Z510 Encounter for antineoplastic radiation therapy: Secondary | ICD-10-CM | POA: Diagnosis not present

## 2023-08-17 DIAGNOSIS — Z79899 Other long term (current) drug therapy: Secondary | ICD-10-CM | POA: Diagnosis not present

## 2023-08-23 DIAGNOSIS — Z0100 Encounter for examination of eyes and vision without abnormal findings: Secondary | ICD-10-CM | POA: Diagnosis not present

## 2023-08-23 DIAGNOSIS — H524 Presbyopia: Secondary | ICD-10-CM | POA: Diagnosis not present

## 2023-08-23 DIAGNOSIS — H43312 Vitreous membranes and strands, left eye: Secondary | ICD-10-CM | POA: Diagnosis not present

## 2023-08-27 ENCOUNTER — Other Ambulatory Visit: Payer: Self-pay | Admitting: Medical-Surgical

## 2023-08-27 DIAGNOSIS — L658 Other specified nonscarring hair loss: Secondary | ICD-10-CM

## 2023-08-29 ENCOUNTER — Ambulatory Visit (INDEPENDENT_AMBULATORY_CARE_PROVIDER_SITE_OTHER): Payer: Medicare HMO | Admitting: Medical-Surgical

## 2023-08-29 ENCOUNTER — Encounter: Payer: Self-pay | Admitting: Medical-Surgical

## 2023-08-29 VITALS — BP 164/94 | HR 69 | Resp 20 | Ht 60.0 in | Wt 124.1 lb

## 2023-08-29 DIAGNOSIS — G500 Trigeminal neuralgia: Secondary | ICD-10-CM | POA: Diagnosis not present

## 2023-08-29 DIAGNOSIS — I10 Essential (primary) hypertension: Secondary | ICD-10-CM

## 2023-08-29 DIAGNOSIS — D3610 Benign neoplasm of peripheral nerves and autonomic nervous system, unspecified: Secondary | ICD-10-CM | POA: Insufficient documentation

## 2023-08-29 NOTE — Progress Notes (Addendum)
        Established patient visit  History, exam, impression, and plan:  1. Trigeminal neuralgia of right side of face Pleasant 77 year old female presenting today for follow up on right sided facial pain. She has been taking Tegretol-XR 200mg  twice daily which has been extremely helpful.  She does still have some issues when she is washing her face if she rubs the skin too hard.  Also has some discomfort when she is chewing food on that side but her pain is nothing like it was.  She is in with neurology and has an upcoming appointment on Friday for initiation of radiation/gamma knife therapy.  2. Schwannoma As noted above, she is now managed by neurology and they are planning to treat the schwannoma with radiation starting on Friday.  3. Essential hypertension, benign Blood pressure is a little elevated today.  She was recently seen by partner here in our practice and prescribed Florinef 0.1 mg daily to help with orthostatic hypotension.  She does have a history of hypertension and as a result, her blood pressure is slightly elevated.  Even higher on recheck.  I feel that her dizziness/lightheadedness is not related to orthostasis and is instead the result of the brain mass and trigeminal neuralgia issues.  Recommend discontinuing Florinef and continuing amlodipine as prescribed.  Monitor blood pressure at home over the next 2 weeks with a goal of 130/80 or less.  Plan to update me via MyChart message on her blood pressure range in a couple of weeks.  Recommend changing the batteries in her blood pressure cuff however if it is not giving accurate results, we may need to look at her purchasing a new 1.  Denies concerning symptoms today.  Cardiopulmonary exam normal.   Procedures performed this visit: None.  Return in about 4 months (around 12/29/2023) for chronic disease follow up.  __________________________________ Thayer Ohm, DNP, APRN, FNP-BC Primary Care and Sports Medicine [facility] [place]

## 2023-09-01 DIAGNOSIS — G500 Trigeminal neuralgia: Secondary | ICD-10-CM | POA: Diagnosis not present

## 2023-09-01 DIAGNOSIS — Z510 Encounter for antineoplastic radiation therapy: Secondary | ICD-10-CM | POA: Diagnosis not present

## 2023-09-01 DIAGNOSIS — G9389 Other specified disorders of brain: Secondary | ICD-10-CM | POA: Diagnosis not present

## 2023-09-01 DIAGNOSIS — Z79899 Other long term (current) drug therapy: Secondary | ICD-10-CM | POA: Diagnosis not present

## 2023-09-01 DIAGNOSIS — D320 Benign neoplasm of cerebral meninges: Secondary | ICD-10-CM | POA: Diagnosis not present

## 2023-09-01 DIAGNOSIS — D3610 Benign neoplasm of peripheral nerves and autonomic nervous system, unspecified: Secondary | ICD-10-CM | POA: Diagnosis not present

## 2023-09-01 DIAGNOSIS — D3611 Benign neoplasm of peripheral nerves and autonomic nervous system of face, head, and neck: Secondary | ICD-10-CM | POA: Diagnosis not present

## 2023-09-06 ENCOUNTER — Other Ambulatory Visit: Payer: Self-pay | Admitting: Physician Assistant

## 2023-09-06 DIAGNOSIS — I951 Orthostatic hypotension: Secondary | ICD-10-CM

## 2023-09-20 DIAGNOSIS — R9389 Abnormal findings on diagnostic imaging of other specified body structures: Secondary | ICD-10-CM | POA: Diagnosis not present

## 2023-09-20 DIAGNOSIS — A310 Pulmonary mycobacterial infection: Secondary | ICD-10-CM | POA: Diagnosis not present

## 2023-10-10 ENCOUNTER — Ambulatory Visit (INDEPENDENT_AMBULATORY_CARE_PROVIDER_SITE_OTHER): Payer: Medicare HMO | Admitting: Medical-Surgical

## 2023-10-10 ENCOUNTER — Encounter: Payer: Self-pay | Admitting: Medical-Surgical

## 2023-10-10 VITALS — BP 167/84 | HR 67 | Resp 20 | Ht 60.0 in | Wt 124.0 lb

## 2023-10-10 DIAGNOSIS — I10 Essential (primary) hypertension: Secondary | ICD-10-CM | POA: Diagnosis not present

## 2023-10-10 DIAGNOSIS — F418 Other specified anxiety disorders: Secondary | ICD-10-CM

## 2023-10-10 MED ORDER — VALSARTAN 40 MG PO TABS: 40.0000 mg | ORAL_TABLET | Freq: Every day | ORAL | 3 refills | Status: DC

## 2023-10-10 MED ORDER — FLUOXETINE HCL 10 MG PO CAPS
10.0000 mg | ORAL_CAPSULE | Freq: Every day | ORAL | 3 refills | Status: DC
Start: 2023-10-10 — End: 2024-04-08

## 2023-10-10 NOTE — Progress Notes (Unsigned)
        Established patient visit  History, exam, impression, and plan:  1. Essential hypertension, benign Pleasant 77 year old female presenting today with a history of hypertension that is currently managed with amlodipine 10 mg daily.  Her blood pressure has been variable lately and she has been monitoring at home.  Her readings have remained elevated above goal with systolics from 133-160s.  Diastolics also elevated.  Admits that she does love salt but has tried to use Mrs. Sharilyn Sites so that she is cutting back.  Previously exercising but since she had issues with trigeminal neuralgia and her hip pain, she has not been doing so.  Plans to get back to this as soon as possible.  Denies any concerning symptoms today.  Blood pressure elevated at 142/79 on arrival.  Recheck at 167/84.  Adding valsartan 40 mg daily.  Continue amlodipine 10 mg daily.  Monitor blood pressure at home.  Return in 2 weeks for nurse visit for blood pressure check.  2. Depression with anxiety Notes that she is taking fluoxetine 20 mg daily, tolerating well however she feels that her emotions are blunted and she cannot express them as she needs to.  Feels that she needs to cry sometimes to release emotion but is unable to do so on this dose of fluoxetine.  Would like to cut back to 10 mg daily.  Feel this is appropriate.  Denies SI/HI.  Sending new dose in.   Procedures performed this visit: None.  Return in about 2 weeks (around 10/24/2023) for nurse visit for BP check.  __________________________________ Thayer Ohm, DNP, APRN, FNP-BC Primary Care and Sports Medicine [facility] [place]

## 2023-10-19 ENCOUNTER — Other Ambulatory Visit: Payer: Self-pay | Admitting: Medical-Surgical

## 2023-10-19 DIAGNOSIS — I10 Essential (primary) hypertension: Secondary | ICD-10-CM

## 2023-10-24 ENCOUNTER — Ambulatory Visit: Payer: Medicare HMO

## 2023-11-01 ENCOUNTER — Other Ambulatory Visit: Payer: Self-pay | Admitting: Physician Assistant

## 2023-11-01 DIAGNOSIS — F418 Other specified anxiety disorders: Secondary | ICD-10-CM

## 2023-11-07 ENCOUNTER — Other Ambulatory Visit: Payer: Self-pay | Admitting: Medical-Surgical

## 2023-11-07 NOTE — Telephone Encounter (Signed)
Dose change to 10 mg daily.

## 2023-11-14 ENCOUNTER — Ambulatory Visit (INDEPENDENT_AMBULATORY_CARE_PROVIDER_SITE_OTHER): Payer: Medicare HMO | Admitting: Sports Medicine

## 2023-11-14 ENCOUNTER — Ambulatory Visit: Payer: Medicare HMO

## 2023-11-14 ENCOUNTER — Encounter: Payer: Self-pay | Admitting: Sports Medicine

## 2023-11-14 DIAGNOSIS — M7071 Other bursitis of hip, right hip: Secondary | ICD-10-CM

## 2023-11-14 DIAGNOSIS — Z96641 Presence of right artificial hip joint: Secondary | ICD-10-CM | POA: Diagnosis not present

## 2023-11-14 DIAGNOSIS — R262 Difficulty in walking, not elsewhere classified: Secondary | ICD-10-CM | POA: Diagnosis not present

## 2023-11-14 DIAGNOSIS — M25551 Pain in right hip: Secondary | ICD-10-CM | POA: Diagnosis not present

## 2023-11-14 NOTE — Progress Notes (Signed)
    Procedures performed today:    None.  Independent interpretation of notes and tests performed by another provider:   None.  Brief History, Exam, Impression, and Recommendations:    Ischial bursitis, right Pleasant 77 year old female, 3 years status post right total hip arthroplasty, tells me she never fully got better with regards to the groin pain. She tells me she has discussed this with her orthopedist in the past. Today her complaint is predominantly right ischial tuberosity pain worse with prolonged sitting. On exam she has tenderness at the ischial tuberosity and weakness to resisted knee flexion on the right side. 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 I would like some baseline x-rays of my own, I would also like her to do some home PT, if insufficient improvement we will consider referral for formal PT and potentially injection.    ____________________________________________ Ihor Austin. Benjamin Stain, M.D., ABFM., CAQSM., AME. Primary Care and Sports Medicine [facility]  Adjunct Professor of Family Medicine  [employer] [place] [employer]  Restaurant manager, fast food

## 2023-11-14 NOTE — Assessment & Plan Note (Addendum)
Pleasant 77 year old female, 3 years status post right total hip arthroplasty, tells me she never fully got better with regards to the groin pain. She tells me she has discussed this with her orthopedist in the past. Today her complaint is predominantly right ischial tuberosity pain worse with prolonged sitting. On exam she has tenderness at the ischial tuberosity and weakness to resisted knee flexion on the right side. 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 I would like some baseline x-rays of my own, I would also like her to do some home PT, if insufficient improvement we will consider referral for formal PT and potentially injection.

## 2023-11-29 ENCOUNTER — Other Ambulatory Visit: Payer: Self-pay | Admitting: Medical-Surgical

## 2023-11-29 DIAGNOSIS — L658 Other specified nonscarring hair loss: Secondary | ICD-10-CM

## 2023-12-09 ENCOUNTER — Other Ambulatory Visit: Payer: Self-pay | Admitting: Medical-Surgical

## 2023-12-09 DIAGNOSIS — E039 Hypothyroidism, unspecified: Secondary | ICD-10-CM

## 2023-12-11 ENCOUNTER — Encounter: Payer: Self-pay | Admitting: Medical-Surgical

## 2023-12-11 DIAGNOSIS — E039 Hypothyroidism, unspecified: Secondary | ICD-10-CM

## 2023-12-12 MED ORDER — LEVOTHYROXINE SODIUM 50 MCG PO TABS: 50.0000 ug | ORAL_TABLET | Freq: Every day | ORAL | 0 refills | Status: DC

## 2023-12-20 DIAGNOSIS — A310 Pulmonary mycobacterial infection: Secondary | ICD-10-CM | POA: Diagnosis not present

## 2023-12-20 DIAGNOSIS — Z1330 Encounter for screening examination for mental health and behavioral disorders, unspecified: Secondary | ICD-10-CM | POA: Diagnosis not present

## 2023-12-20 DIAGNOSIS — R9389 Abnormal findings on diagnostic imaging of other specified body structures: Secondary | ICD-10-CM | POA: Diagnosis not present

## 2023-12-26 ENCOUNTER — Ambulatory Visit: Payer: Medicare HMO | Admitting: Sports Medicine

## 2023-12-30 ENCOUNTER — Ambulatory Visit (INDEPENDENT_AMBULATORY_CARE_PROVIDER_SITE_OTHER): Payer: Medicare HMO

## 2023-12-30 ENCOUNTER — Other Ambulatory Visit: Payer: Self-pay

## 2023-12-30 ENCOUNTER — Ambulatory Visit
Admission: EM | Admit: 2023-12-30 | Discharge: 2023-12-30 | Disposition: A | Discharge status: Home / Self Care | Payer: Medicare HMO | Attending: Family Medicine | Admitting: Family Medicine

## 2023-12-30 DIAGNOSIS — M1711 Unilateral primary osteoarthritis, right knee: Secondary | ICD-10-CM | POA: Diagnosis not present

## 2023-12-30 DIAGNOSIS — M25561 Pain in right knee: Secondary | ICD-10-CM

## 2023-12-30 MED ORDER — HYDROCODONE-ACETAMINOPHEN 5-325 MG PO TABS
1.0000 | ORAL_TABLET | Freq: Four times a day (QID) | ORAL | 0 refills | Status: DC | PRN
Start: 2023-12-30 — End: 2024-04-08

## 2023-12-30 NOTE — Discharge Instructions (Addendum)
Advised patient may take Norco daily or as needed for acute right knee pain.  Advised patient to wear knee brace for right knee support until being evaluated by [facility] orthopedics for further evaluation.

## 2023-12-30 NOTE — ED Triage Notes (Signed)
[facility] presents to [facility] with [facility] of right knee pain for one month. Pt reports 2 weeks ago she saw her pcp and nothing was wrong and symptoms had improved however she reports she twisted it a few days ago with new onset of pain.

## 2023-12-30 NOTE — ED Provider Notes (Signed)
[name]    CSN: [id number] Arrival date & time: 12/30/23  1101      History   Chief Complaint Chief Complaint  Patient presents with   Knee Pain    HPI Omaria Plunk is a 78 y.o. female.   HPI 78 year old female presents with right knee pain for 1 month.  Reports evaluated by PCP 2 weeks ago without imaging performed.  Reports twisting right knee since.  PMH significant for cancer and COPD.  Past Medical History:  Diagnosis Date   Anemia    Anxiety    Arthritis    Asthma    Cancer (HCC)    COPD (chronic obstructive pulmonary disease) (HCC)    COVID-19 07/16/2020   Emphysema of lung (HCC)    GERD (gastroesophageal reflux disease)    Hypertension    Mycobacterium avium complex (HCC) 10/07/2020   Osteoporosis    Thyroid disease     Patient Active Problem List   Diagnosis Date Noted   Ischial bursitis, right 11/14/2023   Trigeminal neuralgia of right side of face 08/29/2023   Schwannoma 08/29/2023   Acquired stenosis of left nasolacrimal duct 12/22/2022   Sinusitis 07/04/2022   Weight loss 06/16/2022   HTN (hypertension) 11/23/2020   Mycobacterium avium complex (HCC) 10/07/2020   COVID-19 07/16/2020   Acquired trigger finger 07/15/2019   Mild persistent asthma with acute exacerbation 02/01/2019   Renal insufficiency 02/01/2019   Syringoma of eyelid 09/16/2018   Acute cystitis 04/23/2018   H/O total knee replacement, left 10/23/2017   Anemia 08/15/2017   DJD of right shoulder 07/07/2017   Iron deficiency anemia 05/31/2017   Pulmonary nodules 12/26/2016   Complete tear of right rotator cuff 11/07/2016   Other chronic pain 11/03/2016   Thyroid nodule 03/30/2016   Solitary pulmonary nodule 03/30/2016   Strain of gluteus medius 10/07/2015   Depression with anxiety 09/29/2015   Vitamin D insufficiency 04/08/2015   Elevated vitamin B12 level 04/08/2015   Hirsutism 01/28/2015   Osteoarthritis of right knee 11/04/2014   Primary osteoarthritis of  right hip 08/21/2014   Female pattern hair loss 08/21/2014   Hypothyroidism 07/24/2014   Spondylolisthesis at L4-L5 level 03/12/2014   Vasomotor flushing 03/05/2014   Right-sided low back pain post T10-S1 fusion 10/14/2013   Coffee ground emesis 03/06/2013   Disorder of bursae and tendons in shoulder region 03/01/2013   Diverticulosis of colon without hemorrhage 02/22/2013   Hyperglycemia 02/01/2013   Essential hypertension, benign 01/31/2013   Osteoporosis 01/31/2013   HSV infection 01/31/2013   Carpal tunnel syndrome 05/16/2012   Tendonitis of wrist, right 05/16/2012    Past Surgical History:  Procedure Laterality Date   ABDOMINAL HYSTERECTOMY     BREAST SURGERY     CESAREAN SECTION     JOINT REPLACEMENT     LT KNEE   TOTAL KNEE ARTHROPLASTY  2006    OB History     Gravida  2   Para  2   Term      Preterm  1   AB      Living  1      SAB      IAB      Ectopic      Multiple      Live Births  1            Home Medications    Prior to Admission medications   Medication Sig Start Date End Date Taking? Authorizing Provider  HYDROcodone-acetaminophen (NORCO/VICODIN) 5-325 MG  tablet Take 1 tablet by mouth every 6 (six) hours as needed. 12/30/23  Yes Trevor Iha, FNP  amLODipine (NORVASC) 10 MG tablet TAKE 1 TABLET BY MOUTH EVERY DAY 10/19/23   Christen Butter, NP  carbamazepine (TEGRETOL XR) 200 MG 12 hr tablet TAKE 1 TABLET BY MOUTH TWICE A DAY 11/13/23   Christen Butter, NP  ethambutol (MYAMBUTOL) 400 MG tablet Take 400 mg by mouth daily. 10/05/22   [provider]  finasteride (PROSCAR) 5 MG tablet TAKE 1/2 TABLET BY MOUTH DAILY 12/01/23   Christen Butter, NP  fludrocortisone (FLORINEF) 0.1 MG tablet TAKE 1 TABLET BY MOUTH EVERY DAY 09/12/23   Breeback, Jade L, PA-C  FLUoxetine (PROZAC) 10 MG capsule Take 1 capsule (10 mg total) by mouth daily. 10/10/23   Christen Butter, NP  levothyroxine (SYNTHROID) 50 MCG tablet Take 1 tablet (50 mcg total) by mouth daily.  12/12/23   Christen Butter, NP  meloxicam (MOBIC) 7.5 MG tablet Take 1 tablet by mouth daily. 09/27/22   [provider]  TRELEGY ELLIPTA 100-62.5-25 MCG/ACT AEPB Inhale 1 puff into the lungs daily. TAKE 1 PUFF BY MOUTH EVERY DAY 08/15/23   Breeback, Jade L, PA-C  valsartan (DIOVAN) 40 MG tablet Take 1 tablet (40 mg total) by mouth daily. 10/10/23   Christen Butter, NP    Family History Family History  Problem Relation Age of Onset   Stroke Other    Emphysema Mother 33       Death cause was emphysema.   Alcohol abuse Father    COPD Sister    Alcohol abuse Brother 40   Cirrhosis Brother    Cancer Maternal Grandmother 24       Stomach   Stroke Maternal Grandfather 24    Social History Social History   Tobacco Use   Smoking status: Former    Current packs/day: 0.00    Average packs/day: 1.5 packs/day for 40.0 years (60.0 ttl pk-yrs)    Types: Cigarettes    Start date: 02/01/1968    Quit date: 02/01/2008    Years since quitting: 15.9   Smokeless tobacco: Never  Vaping Use   Vaping status: Never Used  Substance Use Topics   Alcohol use: Yes    Comment: SOCIALLY   Drug use: No     Allergies   Bacitracin-neomycin-polymyxin, Neomycin-bacitracin zn-polymyx, Clarithromycin, Ofloxacin, Other, Raloxifene, Sulfa antibiotics, Tetracycline, Tetracyclines & related, Bacitracin, Neosporin [neomycin-polymyxin-gramicidin], and Polymyxin b   Review of Systems Review of Systems  Musculoskeletal:        Acute pain of right knee  All other systems reviewed and are negative.    Physical Exam Triage Vital Signs ED Triage Vitals  Encounter Vitals Group     BP 12/30/23 1143 (!) 177/93     Systolic BP Percentile --      Diastolic BP Percentile --      Pulse Rate 12/30/23 1143 67     Resp 12/30/23 1143 16     Temp 12/30/23 1143 (!) 97.5 F (36.4 C)     Temp src --      SpO2 12/30/23 1143 98 %     Weight --      Height --      Head Circumference --      Peak Flow --      Pain  Score 12/30/23 1142 7     Pain Loc --      Pain Education --      Exclude from Growth Chart --  No data found.  Updated Vital Signs BP (!) 177/93   Pulse 67   Temp (!) 97.5 F (36.4 C)   Resp 16   SpO2 98%    Physical Exam Vitals and nursing note reviewed.  Constitutional:      Appearance: Normal appearance. She is normal weight.  HENT:     Head: Normocephalic and atraumatic.     Mouth/Throat:     Mouth: Mucous membranes are moist.     Pharynx: Oropharynx is clear.  Eyes:     Extraocular Movements: Extraocular movements intact.     Conjunctiva/sclera: Conjunctivae normal.     Pupils: Pupils are equal, round, and reactive to light.  Cardiovascular:     Rate and Rhythm: Normal rate and regular rhythm.     Pulses: Normal pulses.     Heart sounds: Normal heart sounds.  Pulmonary:     Effort: Pulmonary effort is normal.     Breath sounds: Normal breath sounds. No wheezing, rhonchi or rales.  Musculoskeletal:        General: Normal range of motion.     Cervical back: Normal range of motion and neck supple.  Skin:    General: Skin is warm and dry.  Neurological:     General: No focal deficit present.     Mental Status: She is alert and oriented to person, place, and time.  Psychiatric:        Mood and Affect: Mood normal.        Behavior: Behavior normal.      UC Treatments / Results  Labs (all labs ordered are listed, but only abnormal results are displayed) Labs Reviewed - No data to display  EKG   Radiology DG Knee Complete 4 Views Right Result Date: 12/30/2023 CLINICAL DATA:  Right knee pain for 1 month. Right knee injury 2-3 days ago. Anterior knee pain. Twisted knee 2-3 days ago and pain worsened. EXAM: RIGHT KNEE - COMPLETE 4+ VIEW COMPARISON:  PA and AP right knee radiographs 10/23/2017 FINDINGS: There is diffuse decreased bone mineralization. Mild medial compartment joint space narrowing. Postsurgical changes are again seen of prior ACL reconstruction.  Mild superior patellar degenerative spurring. No joint effusion. No acute fracture or dislocation. IMPRESSION: 1. Mild medial and patellofemoral compartment osteoarthritis. 2. Prior ACL reconstruction. Electronically Signed   By: Neita Garnet M.D.   On: 12/30/2023 12:48    Procedures Procedures (including critical care time)  Medications Ordered in UC Medications - No data to display  Initial Impression / Assessment and Plan / UC Course  I have reviewed the triage vital signs and the nursing notes.  Pertinent labs & imaging results that were available during my care of the patient were reviewed by me and considered in my medical decision making (see chart for details).     MDM: 1.  Acute pain of right knee-right knee x-ray results revealed above, small right hinged knee brace placed on right knee for additional stability prior to discharge, Rx'd Norco 5/325 mg tablet: Take 1 tablet every 6 hours as needed for right knee pain. Advised patient may take Norco daily or as needed for acute right knee pain.  Advised patient to wear knee brace for right knee support until being evaluated by [facility] orthopedics for further evaluation.  Patient discharged home, hemodynamically stable. Final Clinical Impressions(s) / UC Diagnoses   Final diagnoses:  Acute pain of right knee     Discharge Instructions      Advised patient may  take Norco daily or as needed for acute right knee pain.  Advised patient to wear knee brace for right knee support until being evaluated by [facility] orthopedics for further evaluation.     ED Prescriptions     Medication Sig Dispense Auth. Provider   HYDROcodone-acetaminophen (NORCO/VICODIN) 5-325 MG tablet Take 1 tablet by mouth every 6 (six) hours as needed. 24 tablet Trevor Iha, FNP      I have reviewed the PDMP during this encounter.   Trevor Iha, FNP 12/30/23 1314

## 2024-01-04 ENCOUNTER — Ambulatory Visit: Payer: Medicare HMO | Admitting: Family Medicine

## 2024-01-10 DIAGNOSIS — M238X1 Other internal derangements of right knee: Secondary | ICD-10-CM | POA: Diagnosis not present

## 2024-01-15 ENCOUNTER — Ambulatory Visit: Payer: Medicare HMO | Admitting: Sports Medicine

## 2024-01-15 DIAGNOSIS — M948X6 Other specified disorders of cartilage, lower leg: Secondary | ICD-10-CM | POA: Diagnosis not present

## 2024-01-15 DIAGNOSIS — M25461 Effusion, right knee: Secondary | ICD-10-CM | POA: Diagnosis not present

## 2024-01-15 DIAGNOSIS — M23321 Other meniscus derangements, posterior horn of medial meniscus, right knee: Secondary | ICD-10-CM | POA: Diagnosis not present

## 2024-01-15 DIAGNOSIS — M2391 Unspecified internal derangement of right knee: Secondary | ICD-10-CM | POA: Diagnosis not present

## 2024-01-15 DIAGNOSIS — M7121 Synovial cyst of popliteal space [Baker], right knee: Secondary | ICD-10-CM | POA: Diagnosis not present

## 2024-01-19 ENCOUNTER — Other Ambulatory Visit: Payer: Self-pay | Admitting: Medical-Surgical

## 2024-01-19 DIAGNOSIS — I10 Essential (primary) hypertension: Secondary | ICD-10-CM

## 2024-01-22 DIAGNOSIS — M79661 Pain in right lower leg: Secondary | ICD-10-CM | POA: Diagnosis not present

## 2024-01-22 DIAGNOSIS — M7121 Synovial cyst of popliteal space [Baker], right knee: Secondary | ICD-10-CM | POA: Diagnosis not present

## 2024-01-22 DIAGNOSIS — M238X1 Other internal derangements of right knee: Secondary | ICD-10-CM | POA: Diagnosis not present

## 2024-01-29 ENCOUNTER — Ambulatory Visit (INDEPENDENT_AMBULATORY_CARE_PROVIDER_SITE_OTHER): Payer: Medicare HMO | Admitting: Medical-Surgical

## 2024-01-29 ENCOUNTER — Encounter: Payer: Self-pay | Admitting: Medical-Surgical

## 2024-01-29 VITALS — BP 161/93 | HR 68 | Resp 20 | Ht 60.0 in | Wt 126.6 lb

## 2024-01-29 DIAGNOSIS — E039 Hypothyroidism, unspecified: Secondary | ICD-10-CM

## 2024-01-29 DIAGNOSIS — F418 Other specified anxiety disorders: Secondary | ICD-10-CM

## 2024-01-29 DIAGNOSIS — I10 Essential (primary) hypertension: Secondary | ICD-10-CM | POA: Diagnosis not present

## 2024-01-29 DIAGNOSIS — A310 Pulmonary mycobacterial infection: Secondary | ICD-10-CM | POA: Diagnosis not present

## 2024-01-29 DIAGNOSIS — G500 Trigeminal neuralgia: Secondary | ICD-10-CM | POA: Diagnosis not present

## 2024-01-29 MED ORDER — CARBAMAZEPINE ER 400 MG PO TB12: 400.0000 mg | ORAL_TABLET | Freq: Two times a day (BID) | ORAL | 2 refills | Status: DC

## 2024-01-29 NOTE — Progress Notes (Unsigned)
 Subjective:  Patient ID: Margaret [name], female    DOB: [birth year], 78 y.o.   MRN: [id number]  Patient Care Team: Christen Butter, NP as PCP - General (Nurse Practitioner)   Chief Complaint:  Hypertension   HPI:  Margaret [name] is a 78 y.o. female presenting on 01/29/2024 for Hypertension   History, Exam,  Impression and Plan  1. Trigeminal neuralgia of right side of face (Primary) Pleasant 78 year old female presents with worsening pain from of chronic right side trigeminal neuralgia.  Patient states that she has " new nerves" at the right corner of her nose and right upper lip and cause pain that has gotten worse since radiation/gamma knife therapy to treat a Schwannoma.  Has pain with touch and rubbing of the skin and admits to pain when chewing food.  Asks if can increase Tegretol-XR to help control the pain.  Will check Tegretol level and increase Tegretol to 400 mg twice daily. -    Increase carbamazepine (TEGRETOL XR) 400 MG 12 hr tablet; Take 1 tablet (400 mg total) by mouth 2 (two) times daily. -     Carbamazepine Level (Tegretol), total  2. Essential hypertension, benign Patient blood pressure elevated today. Some confusion about which medications she supposed to be taking for hypertension and subsequently has not been taking amlodipine. Patient filled the prescription and has prescription with her still in the pharmacy bag.  She is taking Diovan daily as prescribed and will add amlodipine to her regimen as directed. See vitals below.  Patient concerned blood pressure might be elevated because of right knee pain from torn meniscus that is being managed by orthopedics and scheduled for knee surgery next month.  -     CBC with Differential/Platelet -     CMP14+EGFR -     Lipid panel     01/29/2024   11:13 AM 12/30/2023   11:43 AM 10/10/2023    9:30 AM  Vitals with BMI  Systolic 161 177 782  Diastolic 93 93 84  Pulse 68 67 67     3. Hypothyroidism, unspecified type Patient is  unsure if she is or has been taking levothyroxine.  Patient expressed some confusion about amlodipine and levothyroxine in which medication was for hypertension and which was for hypothyroidism.  Pt has thyroid supplements with her asking for advice and if they are needed.  Patient will check and see if she has levothyroxine at home and if she has been taking it.  Patient denies fatigue weight gain, cold intolerance, joint and muscle pain (other than right knee with torn meniscus), dry skin thinning hair, slowed heart rate, weight gain or depression. Will draw a TSH level today -     TSH  4. Depression with anxiety Patient states she is doing well on fluoxetine and is not interested in changing medications or regimen right now.  Denies any side effects or complications from medication.  Denies SI/HI.  Patient affect is positive pleasant and energetic.  Patient depression and anxiety well-controlled on fluoxetine. No changes to regimen.   Continue all other maintenance medications.  Follow up plan: Return in about 2 weeks (around 02/12/2024) for nurse visit for BP check. Will notify patient for any abnormal labs and for any changes to regimen and treatment plan.   Relevant past medical, surgical, family, and social history reviewed and updated as indicated.  Allergies and medications reviewed and updated. Data reviewed: Chart in Epic.   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   Osteoporosis    Thyroid disease     Past Surgical History:  Procedure Laterality Date   ABDOMINAL HYSTERECTOMY     BREAST SURGERY     CESAREAN SECTION     JOINT REPLACEMENT     LT KNEE   TOTAL KNEE ARTHROPLASTY  2006    Social History    Socioeconomic History   Marital status: Married    Spouse name: Not on file   Number of children: Not on file   Years of education: Not on file   Highest education level: Associate degree: occupational, Scientist, product/process development, or vocational program  Occupational History   Not on file  Tobacco Use   Smoking status: Former    Current packs/day: 0.00    Average packs/day: 1.5 packs/day for 40.0 years (60.0 ttl pk-yrs)    Types: Cigarettes    Start date: 02/01/1968    Quit date: 02/01/2008    Years since quitting: 16.0   Smokeless tobacco: Never  Vaping Use   Vaping status: Never Used  Substance and Sexual Activity   Alcohol use: Yes    Comment: SOCIALLY   Drug use: No   Sexual activity: Yes    Birth control/protection: None  Other Topics Concern   Not on file  Social History Narrative   ** Merged History Encounter **       Social Drivers of Health   Financial Resource Strain: Low Risk  (01/28/2024)   Overall Financial Resource Strain (CARDIA)    Difficulty of Paying Living Expenses: Not hard at all  Food Insecurity: No Food Insecurity (01/28/2024)   Hunger Vital Sign    Worried About Running Out of Food in the Last Year: Never true    Ran Out of Food in the Last Year: Never true  Transportation Needs: No Transportation Needs (01/28/2024)   PRAPARE - Transportation    Lack of Transportation (Medical): No    Lack of Transportation (Non-Medical): No  Physical Activity: Insufficiently Active (01/28/2024)   Exercise Vital Sign    Days of Exercise per Week: 2 days    Minutes of Exercise per Session: 20 min  Stress: No Stress Concern Present (01/28/2024)   [employer] of Occupational Health - Occupational Stress Questionnaire    Feeling of Stress : Not at all  Social Connections: Socially Integrated (01/28/2024)   Social Connection and Isolation Panel [NHANES]    Frequency of Communication with Friends and Family: Three times a week    Frequency of Social Gatherings with Friends and  Family: Three times a week    Attends Religious Services: More than 4 times per year    Active Member of Clubs or Organizations: Yes    Attends Banker Meetings: Not on file    Marital Status: Married  Intimate Partner Violence: Unknown (06/20/2023)   Received from [facility]   HITS    Physically Hurt: Not on file    Over the last 12 months how often did your partner insult you or talk down to you?: Never    Over the last 12 months how often did your partner threaten you with physical harm?: Never  Over the last 12 months how often did your partner scream or curse at you?: Never    Outpatient Encounter Medications as of 01/29/2024  Medication Sig   carbamazepine (TEGRETOL XR) 400 MG 12 hr tablet Take 1 tablet (400 mg total) by mouth 2 (two) times daily.   Cholecalciferol 125 MCG (5000 UT) capsule Take 5,000 Units by mouth daily.   cyanocobalamin (VITAMIN B12) 1000 MCG tablet Take 1,000 mcg by mouth daily.   amLODipine (NORVASC) 10 MG tablet TAKE 1 TABLET BY MOUTH EVERY DAY   ethambutol (MYAMBUTOL) 400 MG tablet Take 400 mg by mouth daily.   finasteride (PROSCAR) 5 MG tablet TAKE 1/2 TABLET BY MOUTH DAILY   FLUoxetine (PROZAC) 10 MG capsule Take 1 capsule (10 mg total) by mouth daily.   HYDROcodone-acetaminophen (NORCO/VICODIN) 5-325 MG tablet Take 1 tablet by mouth every 6 (six) hours as needed.   levothyroxine (SYNTHROID) 50 MCG tablet Take 1 tablet (50 mcg total) by mouth daily.   Magnesium Oxide -Mg Supplement 500 MG TABS Take by mouth.   meloxicam (MOBIC) 7.5 MG tablet Take 1 tablet by mouth daily.   TRELEGY ELLIPTA 100-62.5-25 MCG/ACT AEPB Inhale 1 puff into the lungs daily. TAKE 1 PUFF BY MOUTH EVERY DAY   valsartan (DIOVAN) 40 MG tablet Take 1 tablet (40 mg total) by mouth daily.   [DISCONTINUED] carbamazepine (TEGRETOL XR) 200 MG 12 hr tablet TAKE 1 TABLET BY MOUTH TWICE A DAY   [DISCONTINUED] fludrocortisone (FLORINEF) 0.1 MG tablet TAKE 1 TABLET BY MOUTH EVERY  DAY   No facility-administered encounter medications on file as of 01/29/2024.    Allergies  Allergen Reactions   Bacitracin-Neomycin-Polymyxin Anaphylaxis and Hives   Neomycin-Bacitracin Zn-Polymyx Anaphylaxis   Clarithromycin Rash and Other (See Comments)    THRUSH     Ofloxacin Hives, Nausea And Vomiting, Rash and Other (See Comments)    GI     Other Other (See Comments)    ANTIBIOTIC OINT - UNSURE OF NAME  Hives, GI upset    Raloxifene Other (See Comments)    Back pain/ PT NOT SURE ABOUT THIS REACTION      Sulfa Antibiotics Other (See Comments)    GI UPSET    Tetracycline Other (See Comments) and Nausea And Vomiting   Tetracyclines & Related Nausea And Vomiting    Other reaction(s): Other SEVERE INDIGESTION    Bacitracin    Neosporin [Neomycin-Polymyxin-Gramicidin] Hives   Polymyxin B     Review of Systems      Objective:  BP (!) 161/93 (BP Location: Right Arm, Cuff Size: Normal)   Pulse 68   Resp 20   Ht 5' (1.524 m)   Wt 126 lb 9.6 oz (57.4 kg)   SpO2 98%   BMI 24.72 kg/m    Wt Readings from Last 3 Encounters:  01/29/24 126 lb 9.6 oz (57.4 kg)  10/10/23 124 lb (56.2 kg)  08/29/23 124 lb 1.9 oz (56.3 kg)    Physical Exam  Results for orders placed or performed in visit on 08/15/23  CMP14+EGFR   Collection Time: 08/15/23 10:31 AM  Result Value Ref Range   Glucose 104 (H) 70 - 99 mg/dL   BUN 14 8 - 27 mg/dL   Creatinine, Ser 1.61 0.57 - 1.00 mg/dL   eGFR 65 >09 UE/AVW/0.98   BUN/Creatinine Ratio 15 12 - 28   Sodium 137 134 - 144 mmol/L   Potassium 4.9 3.5 - 5.2 mmol/L   Chloride 99 96 - 106 mmol/L  CO2 25 20 - 29 mmol/L   Calcium 9.9 8.7 - 10.3 mg/dL   Total Protein 6.8 6.0 - 8.5 g/dL   Albumin 4.5 3.8 - 4.8 g/dL   Globulin, Total 2.3 1.5 - 4.5 g/dL   Bilirubin Total 0.3 0.0 - 1.2 mg/dL   Alkaline Phosphatase 80 44 - 121 IU/L   AST 23 0 - 40 IU/L   ALT 26 0 - 32 IU/L  CBC w/Diff/Platelet   Collection Time: 08/15/23 10:31 AM   Result Value Ref Range   WBC 7.7 3.4 - 10.8 x10E3/uL   RBC 4.10 3.77 - 5.28 x10E6/uL   Hemoglobin 13.2 11.1 - 15.9 g/dL   Hematocrit 16.1 09.6 - 46.6 %   MCV 98 (H) 79 - 97 fL   MCH 32.2 26.6 - 33.0 pg   MCHC 32.9 31.5 - 35.7 g/dL   RDW 04.5 40.9 - 81.1 %   Platelets 362 150 - 450 x10E3/uL   Neutrophils 71 Not Estab. %   Lymphs 15 Not Estab. %   Monocytes 10 Not Estab. %   Eos 2 Not Estab. %   Basos 1 Not Estab. %   Neutrophils Absolute 5.5 1.4 - 7.0 x10E3/uL   Lymphocytes Absolute 1.2 0.7 - 3.1 x10E3/uL   Monocytes Absolute 0.7 0.1 - 0.9 x10E3/uL   EOS (ABSOLUTE) 0.1 0.0 - 0.4 x10E3/uL   Basophils Absolute 0.0 0.0 - 0.2 x10E3/uL   Immature Granulocytes 1 Not Estab. %   Immature Grans (Abs) 0.1 0.0 - 0.1 x10E3/uL       Pertinent labs & imaging results that were available during my care of the patient were reviewed by me and considered in my medical decision making.   Continue healthy lifestyle choices, including diet (rich in fruits, vegetables, and lean proteins, and low in salt and simple carbohydrates) and exercise (at least 30 minutes of moderate physical activity daily).   The above assessment and management plan was discussed with the patient. The patient verbalized understanding of and has agreed to the management plan. Patient is aware to call the clinic if they develop any new symptoms or if symptoms persist or worsen. Patient is aware when to return to the clinic for a follow-up visit. Patient educated on when it is appropriate to go to the emergency department.   Maryelizabeth Kaufmann Student AGNP

## 2024-01-30 ENCOUNTER — Encounter: Payer: Self-pay | Admitting: Medical-Surgical

## 2024-01-30 LAB — CMP14+EGFR
ALT: 18 [IU]/L (ref 0–32)
AST: 22 [IU]/L (ref 0–40)
Albumin: 4.7 g/dL (ref 3.8–4.8)
Alkaline Phosphatase: 98 [IU]/L (ref 44–121)
BUN/Creatinine Ratio: 24 (ref 12–28)
BUN: 20 mg/dL (ref 8–27)
Bilirubin Total: 0.3 mg/dL (ref 0.0–1.2)
CO2: 23 mmol/L (ref 20–29)
Calcium: 9.8 mg/dL (ref 8.7–10.3)
Chloride: 98 mmol/L (ref 96–106)
Creatinine, Ser: 0.82 mg/dL (ref 0.57–1.00)
Globulin, Total: 2.3 g/dL (ref 1.5–4.5)
Glucose: 105 mg/dL — ABNORMAL HIGH (ref 70–99)
Potassium: 4.5 mmol/L (ref 3.5–5.2)
Sodium: 137 mmol/L (ref 134–144)
Total Protein: 7 g/dL (ref 6.0–8.5)
eGFR: 74 mL/min/{1.73_m2} (ref >59)

## 2024-01-30 LAB — CBC WITH DIFFERENTIAL/PLATELET
Basophils Absolute: 0 10*3/uL (ref 0.0–0.2)
Basos: 1 %
EOS (ABSOLUTE): 0.2 10*3/uL (ref 0.0–0.4)
Eos: 2 %
Hematocrit: 39.2 % (ref 34.0–46.6)
Hemoglobin: 13.5 g/dL (ref 11.1–15.9)
Immature Grans (Abs): 0.1 10*3/uL (ref 0.0–0.1)
Immature Granulocytes: 1 %
Lymphocytes Absolute: 1.3 10*3/uL (ref 0.7–3.1)
Lymphs: 16 %
MCH: 32.5 pg (ref 26.6–33.0)
MCHC: 34.4 g/dL (ref 31.5–35.7)
MCV: 95 fL (ref 79–97)
Monocytes Absolute: 0.6 10*3/uL (ref 0.1–0.9)
Monocytes: 8 %
Neutrophils Absolute: 6 10*3/uL (ref 1.4–7.0)
Neutrophils: 72 %
Platelets: 258 10*3/uL (ref 150–450)
RBC: 4.15 x10E6/uL (ref 3.77–5.28)
RDW: 12.8 % (ref 11.7–15.4)
WBC: 8.2 10*3/uL (ref 3.4–10.8)

## 2024-01-30 LAB — CARBAMAZEPINE LEVEL, TOTAL: Carbamazepine (Tegretol), S: 7.1 ug/mL (ref 4.0–12.0)

## 2024-01-30 LAB — LIPID PANEL
Chol/HDL Ratio: 2.1 {ratio} (ref 0.0–4.4)
Cholesterol, Total: 204 mg/dL — ABNORMAL HIGH (ref 100–199)
HDL: 95 mg/dL (ref >39)
LDL Chol Calc (NIH): 91 mg/dL (ref 0–99)
Triglycerides: 105 mg/dL (ref 0–149)
VLDL Cholesterol Cal: 18 mg/dL (ref 5–40)

## 2024-01-30 LAB — TSH: TSH: 16.8 u[IU]/mL — ABNORMAL HIGH (ref 0.450–4.500)

## 2024-01-30 NOTE — Progress Notes (Signed)
 Medical screening examination/treatment was performed by qualified clinical staff member and as supervising provider I was immediately available for consultation/collaboration. I have reviewed documentation and agree with assessment and plan.  Thayer Ohm, DNP, APRN, FNP-BC [facility] and [facility]

## 2024-02-09 ENCOUNTER — Other Ambulatory Visit: Payer: Self-pay | Admitting: Medical-Surgical

## 2024-02-12 ENCOUNTER — Encounter: Payer: Self-pay | Admitting: Medical-Surgical

## 2024-02-12 ENCOUNTER — Ambulatory Visit (INDEPENDENT_AMBULATORY_CARE_PROVIDER_SITE_OTHER): Payer: Medicare HMO | Admitting: Medical-Surgical

## 2024-02-12 ENCOUNTER — Other Ambulatory Visit: Payer: Self-pay

## 2024-02-12 VITALS — BP 156/81 | HR 64

## 2024-02-12 DIAGNOSIS — I10 Essential (primary) hypertension: Secondary | ICD-10-CM

## 2024-02-12 MED ORDER — TRELEGY ELLIPTA 100-62.5-25 MCG/ACT IN AEPB: 1.0000 | INHALATION_SPRAY | Freq: Every day | RESPIRATORY_TRACT | 5 refills | Status: DC

## 2024-02-12 MED ORDER — OMEPRAZOLE 40 MG PO CPDR: 40.0000 mg | DELAYED_RELEASE_CAPSULE | Freq: Every day | ORAL | 1 refills | Status: DC

## 2024-02-12 NOTE — Progress Notes (Addendum)
 Pt presents to clinic today for BP check.  Denies any cp/sob/palpitaions/headaches/dizziness or swelling.   On first check of pt's BP it was elevated. I had her to sit before checking this for a 2nd time.   Pt stated that she is having knee surgery on Monday and was advised to stop taking the Valsartan.   2nd bp was still elevated pt made aware. Advised her to increase the Valsartan to 80 mg  I informed her that I was on hold with the surgeons office and would let her know what they said about this medication.   Margaret [name] stated that she will send a message to the clinical team regarding this and have someone call back about this.

## 2024-02-15 ENCOUNTER — Other Ambulatory Visit: Payer: Self-pay | Admitting: Physician Assistant

## 2024-02-19 DIAGNOSIS — M94261 Chondromalacia, right knee: Secondary | ICD-10-CM | POA: Diagnosis not present

## 2024-02-19 DIAGNOSIS — S83281A Other tear of lateral meniscus, current injury, right knee, initial encounter: Secondary | ICD-10-CM | POA: Diagnosis not present

## 2024-02-19 DIAGNOSIS — M23300 Other meniscus derangements, unspecified lateral meniscus, right knee: Secondary | ICD-10-CM | POA: Diagnosis not present

## 2024-02-19 DIAGNOSIS — M23303 Other meniscus derangements, unspecified medial meniscus, right knee: Secondary | ICD-10-CM | POA: Diagnosis not present

## 2024-02-19 DIAGNOSIS — M2391 Unspecified internal derangement of right knee: Secondary | ICD-10-CM | POA: Diagnosis not present

## 2024-02-19 DIAGNOSIS — G8918 Other acute postprocedural pain: Secondary | ICD-10-CM | POA: Diagnosis not present

## 2024-02-19 DIAGNOSIS — S83241A Other tear of medial meniscus, current injury, right knee, initial encounter: Secondary | ICD-10-CM | POA: Diagnosis not present

## 2024-02-19 DIAGNOSIS — M2241 Chondromalacia patellae, right knee: Secondary | ICD-10-CM | POA: Diagnosis not present

## 2024-02-19 DIAGNOSIS — M1711 Unilateral primary osteoarthritis, right knee: Secondary | ICD-10-CM | POA: Diagnosis not present

## 2024-02-20 DIAGNOSIS — R6889 Other general symptoms and signs: Secondary | ICD-10-CM | POA: Insufficient documentation

## 2024-02-21 DIAGNOSIS — R6889 Other general symptoms and signs: Secondary | ICD-10-CM | POA: Diagnosis not present

## 2024-02-21 DIAGNOSIS — M2391 Unspecified internal derangement of right knee: Secondary | ICD-10-CM | POA: Diagnosis not present

## 2024-02-28 DIAGNOSIS — D3610 Benign neoplasm of peripheral nerves and autonomic nervous system, unspecified: Secondary | ICD-10-CM | POA: Diagnosis not present

## 2024-02-29 ENCOUNTER — Ambulatory Visit: Admitting: Medical-Surgical

## 2024-02-29 ENCOUNTER — Encounter: Payer: Self-pay | Admitting: Medical-Surgical

## 2024-02-29 VITALS — BP 145/84 | HR 80 | Resp 20 | Ht 60.0 in | Wt 126.4 lb

## 2024-02-29 DIAGNOSIS — E039 Hypothyroidism, unspecified: Secondary | ICD-10-CM

## 2024-02-29 DIAGNOSIS — D509 Iron deficiency anemia, unspecified: Secondary | ICD-10-CM

## 2024-02-29 DIAGNOSIS — R32 Unspecified urinary incontinence: Secondary | ICD-10-CM

## 2024-02-29 MED ORDER — OXYBUTYNIN CHLORIDE ER 5 MG PO TB24: 5.0000 mg | ORAL_TABLET | Freq: Every day | ORAL | 3 refills | Status: DC

## 2024-02-29 NOTE — Progress Notes (Signed)
        Established patient visit  History, exam, impression, and plan:  1. Hypothyroidism, unspecified type (Primary) Very pleasant 78 year old female presenting today with a history of hypothyroidism currently treated with levothyroxine 75 mcg daily.  Taking medication as prescribed, every day with no missed doses.  No SE.  Denies concerning symptoms.  Due for recheck of TSH.  Will plan to get that today.  For now continue levothyroxine as prescribed.  Possible dose change depending on lab results. - TSH - levothyroxine (SYNTHROID) 75 MCG tablet; Take 1 tablet (75 mcg total) by mouth daily.  Dispense: 90 tablet; Refill: 1  2. Iron deficiency anemia, unspecified iron deficiency anemia type History of iron deficiency and not currently taking a supplement.  Checking iron today. - Iron, TIBC and Ferritin Panel  3. Urinary incontinence, unspecified type Has been having issues with urinary incontinence for a while.  Over the last couple of months, she has gone from having urge incontinence and stress incontinence to leaking urine during the daytime without her knowledge.  She does work to stay very well-hydrated and does not have any urinary tract infection symptoms.  Hesitant to consider wearing a pad or depends but is getting very frustrated with having her close get wet on a frequent basis.  Discussed various options for treatment including medications, pelvic physical therapy, and urology referral.  She is very hesitant to consider urology referral and pelvic physical therapy at this time but would like to try medication.  I did advise that medications on the market are made to help with overactive bladder and urge incontinence and may not benefit for stress or functional incontinence.  She would still like to try medicine so starting Ditropan XL 5 mg nightly.  Plan to monitor closely to see if this is beneficial.  If not, plan to refer to urology for further evaluation. Excuse  Procedures  performed this visit: None.  Return in about 4 weeks (around 03/28/2024) for Ditropan follow up.  __________________________________ Thayer Ohm, DNP, APRN, FNP-BC Primary Care and Sports Medicine [facility]

## 2024-03-01 ENCOUNTER — Encounter: Payer: Self-pay | Admitting: Medical-Surgical

## 2024-03-01 LAB — TSH: TSH: 10.7 u[IU]/mL — ABNORMAL HIGH (ref 0.450–4.500)

## 2024-03-01 LAB — IRON,TIBC AND FERRITIN PANEL
Ferritin: 64 ng/mL (ref 15–150)
Iron Saturation: 37 % (ref 15–55)
Iron: 106 ug/dL (ref 27–139)
Total Iron Binding Capacity: 288 ug/dL (ref 250–450)
UIBC: 182 ug/dL (ref 118–369)

## 2024-03-01 MED ORDER — LEVOTHYROXINE SODIUM 75 MCG PO TABS: 75.0000 ug | ORAL_TABLET | Freq: Every day | ORAL | 1 refills | Status: DC

## 2024-03-02 ENCOUNTER — Other Ambulatory Visit: Payer: Self-pay | Admitting: Medical-Surgical

## 2024-03-02 ENCOUNTER — Encounter: Payer: Self-pay | Admitting: Medical-Surgical

## 2024-03-02 DIAGNOSIS — L658 Other specified nonscarring hair loss: Secondary | ICD-10-CM

## 2024-03-13 DIAGNOSIS — R6889 Other general symptoms and signs: Secondary | ICD-10-CM | POA: Diagnosis not present

## 2024-03-13 DIAGNOSIS — M2391 Unspecified internal derangement of right knee: Secondary | ICD-10-CM | POA: Diagnosis not present

## 2024-03-28 ENCOUNTER — Ambulatory Visit: Admitting: Medical-Surgical

## 2024-04-04 DIAGNOSIS — R6889 Other general symptoms and signs: Secondary | ICD-10-CM | POA: Diagnosis not present

## 2024-04-04 DIAGNOSIS — M2391 Unspecified internal derangement of right knee: Secondary | ICD-10-CM | POA: Diagnosis not present

## 2024-04-08 ENCOUNTER — Encounter: Payer: Self-pay | Admitting: Medical-Surgical

## 2024-04-08 ENCOUNTER — Ambulatory Visit (INDEPENDENT_AMBULATORY_CARE_PROVIDER_SITE_OTHER): Admitting: Medical-Surgical

## 2024-04-08 VITALS — BP 145/75 | HR 62 | Resp 20 | Ht 60.0 in | Wt 122.0 lb

## 2024-04-08 DIAGNOSIS — G500 Trigeminal neuralgia: Secondary | ICD-10-CM | POA: Diagnosis not present

## 2024-04-08 DIAGNOSIS — I10 Essential (primary) hypertension: Secondary | ICD-10-CM | POA: Diagnosis not present

## 2024-04-08 DIAGNOSIS — F418 Other specified anxiety disorders: Secondary | ICD-10-CM

## 2024-04-08 DIAGNOSIS — R32 Unspecified urinary incontinence: Secondary | ICD-10-CM

## 2024-04-08 DIAGNOSIS — E039 Hypothyroidism, unspecified: Secondary | ICD-10-CM | POA: Diagnosis not present

## 2024-04-08 MED ORDER — FLUOXETINE HCL 10 MG PO CAPS
10.0000 mg | ORAL_CAPSULE | Freq: Every day | ORAL | 3 refills | Status: DC
Start: 2024-04-08 — End: 2024-10-10

## 2024-04-08 MED ORDER — VALSARTAN 80 MG PO TABS: 80.0000 mg | ORAL_TABLET | Freq: Every day | ORAL | 1 refills | Status: DC

## 2024-04-08 MED ORDER — OXYBUTYNIN CHLORIDE ER 10 MG PO TB24: 10.0000 mg | ORAL_TABLET | Freq: Every day | ORAL | 3 refills | Status: DC

## 2024-04-08 NOTE — Progress Notes (Signed)
 Established patient visit  History, exam, impression, and plan:  1. Urinary incontinence, unspecified type (Primary) Very pleasant 78 year old female presenting today with a history of urinary incontinence.  She was started on Ditropan  approximately 4 weeks ago with the 5 mg dose nightly.  Tolerating well without side effects.  Has noticed some benefit however feels the medication could work better.  Still having some incontinence episodes and interested in possibly increasing her dose.  Since she is tolerating the 5 mg dose well without side effects, increasing Ditropan  to 10 mg nightly.  Plan to follow-up closely to evaluate tolerance and response.  2. Hypothyroidism, unspecified type History of hypothyroidism currently on levothyroxine  75 mcg daily.  Her last 2 TSH readings have been significantly elevated.  She has some questions regarding thyroid  function and would like to have her free T4 and free T3 checked as well as the TSH.  Has been on the 75 mcg dose for approximately 2 months.  No new symptoms that are concerning.  Continues to have hair loss and significant fatigue.  Rechecking TSH with free T4 and free T3 today.  Continue levothyroxine , dose may change depending on results. - TSH+T4F+T3Free  3. Depression with anxiety Prescribed fluoxetine  10 mg daily however notes that she has been having a lot of trouble getting the medication filled.  She reports the pharmacy tells her she has no refills on it and she has been out of it for several weeks.  On review of her records, a 90-day supply of fluoxetine  was sent in November 2024 with 3 refills to equal a year supply.  This was received by the pharmacy with confirmation.  Unclear why her refills have not gotten filled.  Resending fluoxetine  10 mg daily to the pharmacy.  Plan to restart as soon as possible. - FLUoxetine  (PROZAC ) 10 MG capsule; Take 1 capsule (10 mg total) by mouth daily.  Dispense: 90 capsule; Refill: 3  4. Trigeminal  neuralgia of right side of face Continues to have significant right facial pain.  Taking carbamazepine  400 mg twice daily which is helping with her symptoms but she still has breakthrough.  Has to be very careful with touching her face and with chewing certain foods.  Because of the trigeminal neuralgia appears to be a schwannoma.  She is seeing neurology and is under their care for treatment of that.  She reports mentioning the facial pain to the neurologist but no further conversation was had regarding treatment of it.  Would like to keep her on 400 mg twice daily for now.  Recommend she reach out to neurology to see if they have any recommendations for dosage adjustment or a different medication that may help with her pain a bit better.  Patient verbalized understanding is agreeable to the plan.  5.  Essential hypertension Taking amlodipine  10 mg daily with valsartan  40 mg daily.  Checking blood pressure regularly at home with a new cuff that was purchased by her husband.  Notes that her readings are running similar to today's with the systolics in the 140s.  Follows a low-sodium diet.  Activity as tolerated.  Denies any concerning symptoms today.  Cardiopulmonary exam is normal.  Recheck of blood pressure still elevated at 145/75.  Continue amlodipine  10 mg daily.  Increasing valsartan  to 80 mg daily.  Return in 2 weeks for a nurse visit for blood pressure check.  Requested her to bring her new home cuff with her so we can  validate its accuracy.  Procedures performed this visit: None.  Return in about 7 weeks (around 05/27/2024) for Ditropan  follow up.  __________________________________ Margaret Snook, DNP, APRN, FNP-BC Primary Care and Sports Medicine [facility] [place]

## 2024-04-09 ENCOUNTER — Encounter: Payer: Self-pay | Admitting: Medical-Surgical

## 2024-04-09 LAB — TSH+T4F+T3FREE
Free T4: 1.09 ng/dL (ref 0.82–1.77)
T3, Free: 2.5 pg/mL (ref 2.0–4.4)
TSH: 2.44 u[IU]/mL (ref 0.450–4.500)

## 2024-04-15 ENCOUNTER — Encounter (HOSPITAL_COMMUNITY): Payer: Self-pay

## 2024-04-22 ENCOUNTER — Ambulatory Visit (INDEPENDENT_AMBULATORY_CARE_PROVIDER_SITE_OTHER)

## 2024-04-22 VITALS — BP 136/73 | HR 73

## 2024-04-22 DIAGNOSIS — I10 Essential (primary) hypertension: Secondary | ICD-10-CM | POA: Diagnosis not present

## 2024-04-22 NOTE — Progress Notes (Signed)
   Established Patient Office Visit  Subjective   Patient ID: Margaret [name], female    DOB: Dec 03, 1946  Age: 78 y.o. MRN: [id number]  No chief complaint on file.   HPI  Margaret [name] is here for blood pressure check. Denies chest pain, new shortness of breath or dizziness. Home blood pressure monitor check on her husband's last visit.   ROS    Objective:     BP 136/73   Pulse 73   SpO2 99%    Physical Exam   No results found for any visits on 04/22/24.    The 10-year ASCVD risk score (Arnett DK, et al., 2019) is: 29.8%    Assessment & Plan:  Blood pressure check - Blood pressure less than 140/90. Patient advised to continue medications as directed.    Problem List Items Addressed This Visit       Unprioritized   Essential hypertension, benign - Primary (Chronic)    Return in about 3 months (around 07/23/2024) for HTN with Joy. Arleen Bells, Aneta Bar, CMA

## 2024-04-23 ENCOUNTER — Other Ambulatory Visit: Payer: Self-pay | Admitting: Medical-Surgical

## 2024-04-23 DIAGNOSIS — I10 Essential (primary) hypertension: Secondary | ICD-10-CM

## 2024-05-02 DIAGNOSIS — M2391 Unspecified internal derangement of right knee: Secondary | ICD-10-CM | POA: Diagnosis not present

## 2024-05-02 DIAGNOSIS — R6889 Other general symptoms and signs: Secondary | ICD-10-CM | POA: Diagnosis not present

## 2024-05-05 ENCOUNTER — Other Ambulatory Visit: Payer: Self-pay | Admitting: Medical-Surgical

## 2024-05-05 DIAGNOSIS — G500 Trigeminal neuralgia: Secondary | ICD-10-CM

## 2024-05-09 ENCOUNTER — Ambulatory Visit

## 2024-05-09 ENCOUNTER — Telehealth: Payer: Self-pay | Admitting: *Deleted

## 2024-05-09 ENCOUNTER — Encounter: Payer: Self-pay | Admitting: Podiatry

## 2024-05-09 ENCOUNTER — Ambulatory Visit (INDEPENDENT_AMBULATORY_CARE_PROVIDER_SITE_OTHER): Payer: Self-pay | Admitting: Podiatry

## 2024-05-09 DIAGNOSIS — M2042 Other hammer toe(s) (acquired), left foot: Secondary | ICD-10-CM | POA: Diagnosis not present

## 2024-05-09 DIAGNOSIS — M7731 Calcaneal spur, right foot: Secondary | ICD-10-CM | POA: Diagnosis not present

## 2024-05-09 DIAGNOSIS — M21611 Bunion of right foot: Secondary | ICD-10-CM

## 2024-05-09 DIAGNOSIS — M2012 Hallux valgus (acquired), left foot: Secondary | ICD-10-CM | POA: Diagnosis not present

## 2024-05-09 DIAGNOSIS — M2011 Hallux valgus (acquired), right foot: Secondary | ICD-10-CM | POA: Diagnosis not present

## 2024-05-09 DIAGNOSIS — M21612 Bunion of left foot: Secondary | ICD-10-CM | POA: Diagnosis not present

## 2024-05-09 DIAGNOSIS — M19071 Primary osteoarthritis, right ankle and foot: Secondary | ICD-10-CM | POA: Diagnosis not present

## 2024-05-09 DIAGNOSIS — M2041 Other hammer toe(s) (acquired), right foot: Secondary | ICD-10-CM | POA: Diagnosis not present

## 2024-05-09 DIAGNOSIS — M19072 Primary osteoarthritis, left ankle and foot: Secondary | ICD-10-CM | POA: Diagnosis not present

## 2024-05-09 DIAGNOSIS — L600 Ingrowing nail: Secondary | ICD-10-CM | POA: Diagnosis not present

## 2024-05-09 NOTE — Patient Instructions (Signed)

## 2024-05-09 NOTE — Progress Notes (Signed)
 Subjective:  Patient ID: Margaret [name], female    DOB: 1946-01-19,   MRN: [id number]  Chief Complaint  Patient presents with   Foot Pain    "I've got these two Hammer Toes.  I've got a corn on the big toe on the left.  The right one bothers me but not like the other one.  I had bunion surgery years ago but they have come back." N - hammer toes L - 2nd bilateral D - 2 years O - gradually worse, left > right C - corn, ache A - closed toed shoes T - none   Nail Problem    I have ingrown toenails." N - ingrown toenail L - hallux left D - 1 week O - suddenly C - sharp pain A - shoe T - none, usually get my toes done and she takes it out    78 y.o. female presents for concerns as above.  Denies any other pedal complaints. Denies n/v/f/c.   Past Medical History:  Diagnosis Date   Anemia    Anxiety    Arthritis    Asthma    Cancer (HCC)    COPD (chronic obstructive pulmonary disease) (HCC)    COVID-19 07/16/2020   Emphysema of lung (HCC)    GERD (gastroesophageal reflux disease)    Hypertension    Mycobacterium avium complex (HCC) 10/07/2020   Osteoporosis    Thyroid  disease     Objective:  Physical Exam: Vascular: DP/PT pulses 2/4 bilateral. CFT <3 seconds. Normal hair growth on digits. No edema.  Skin. No lacerations or abrasions bilateral feet. Incurvation of bilateral borders of left hallux. No erythema edema or purulence noted. Tender to palpation.  Musculoskeletal: MMT 5/5 bilateral lower extremities in DF, PF, Inversion and Eversion. Deceased ROM in DF of ankle joint. Bilateral mild HAV deformity noted no pain to medial eminence. There is hypekeratosis and pain noted bilateral medial second digits with hammered rigid second digits bilateral worse on left.  Neurological: Sensation intact to light touch.   Assessment:   1. Ingrown left greater toenail   2. Hammertoe of second toe of left foot   3. Hammertoe of second toe of right foot   4. Bilateral bunions       Plan:  Patient was evaluated and treated and all questions answered. -Xrays reviewed. No acute fractures or dislocations noted. Previous bunion surgery changes noted with reoccurrence of HAV deformity and degenerative changes noted at first MPJ bilateral. Mild hammered second digits bilateral as well.  -Discussed HAV and hamemrtoes and treatment options;conservative and surgical management; risks, benefits, alternatives discussed. All patient's questions answered. -Discussed padding and wide shoe gear.  Toe spacer provided.  -Recommend continue with good supportive shoes and inserts.  -Discussed surgical options. Will try spacer and if no improvement will consider surgery did briefly discuss surgical correction including first MPJ fusion and hammertoe correction of the second digit.   Discussed ingrown toenails etiology and treatment options including procedure for removal vs conservative care.  Patient requesting removal of ingrown nail today. Procedure below.  Discussed procedure and post procedure care and patient expressed understanding.  Will follow-up in 2 weeks for nail check or sooner if any problems arise.    Procedure:  Procedure: partial Nail Avulsion of left hallux bilateral nail border.  Surgeon: Jennefer Moats, DPM  Pre-op Dx: Ingrown toenail without infection Post-op: Same  Place of Surgery: Office exam room.  Indications for surgery: Painful and ingrown toenail.    The patient  is requesting removal of nail with  chemical matrixectomy. Risks and complications were discussed with the patient for which they understand and written consent was obtained. Under sterile conditions a total of 3 mL of  1% lidocaine  plain was infiltrated in a hallux block fashion. Once anesthetized, the skin was prepped in sterile fashion. A tourniquet was then applied. Next the bilateral aspect of hallux nail border was then sharply excised making sure to remove the entire offending nail border.   Next phenol was then applied under standard conditions to permanently destroy the matrix and copiously irrigated. Silvadene  was applied. A dry sterile dressing was applied. After application of the dressing the tourniquet was removed and there is found to be an immediate capillary refill time to the digit. The patient tolerated the procedure well without any complications. Post procedure instructions were discussed the patient for which he verbally understood. Follow-up in two weeks for nail check or sooner if any problems are to arise. Discussed signs/symptoms of infection and directed to call the office immediately should any occur or go directly to the emergency room. In the meantime, encouraged to call the office with any questions, concerns, changes symptoms.   Jennefer Moats, DPM

## 2024-05-10 ENCOUNTER — Telehealth: Payer: Self-pay

## 2024-05-10 NOTE — Telephone Encounter (Signed)
 Patient called and left a message - she has questions about ingrown toenail aftercare - tried to call back, phone just rang and then went silent.

## 2024-05-10 NOTE — Telephone Encounter (Signed)
 I informed the patient that she will need xrays upon her arrival for her appointment.  I told her once she checks in, she will be directed to imaging.

## 2024-05-20 DIAGNOSIS — M1711 Unilateral primary osteoarthritis, right knee: Secondary | ICD-10-CM | POA: Diagnosis not present

## 2024-05-20 DIAGNOSIS — M62551 Muscle wasting and atrophy, not elsewhere classified, right thigh: Secondary | ICD-10-CM | POA: Diagnosis not present

## 2024-05-23 ENCOUNTER — Encounter: Payer: Self-pay | Admitting: Podiatry

## 2024-05-23 ENCOUNTER — Ambulatory Visit (INDEPENDENT_AMBULATORY_CARE_PROVIDER_SITE_OTHER): Admitting: Podiatry

## 2024-05-23 DIAGNOSIS — M2041 Other hammer toe(s) (acquired), right foot: Secondary | ICD-10-CM

## 2024-05-23 DIAGNOSIS — M21612 Bunion of left foot: Secondary | ICD-10-CM

## 2024-05-23 DIAGNOSIS — M2042 Other hammer toe(s) (acquired), left foot: Secondary | ICD-10-CM

## 2024-05-23 DIAGNOSIS — L600 Ingrowing nail: Secondary | ICD-10-CM

## 2024-05-23 DIAGNOSIS — M21611 Bunion of right foot: Secondary | ICD-10-CM

## 2024-05-23 NOTE — Progress Notes (Signed)
  Subjective:  Patient ID: Margaret [name], female    DOB: 1946-03-22,   MRN: [id number]  Chief Complaint  Patient presents with   Ingrown Toenail    Nail check - hallux left   Its doing fine, but I haven't worn any closed toe shoes to really test it    78 y.o. female presents for follow-up of left ingrown toenail removal and to discuss. Denies any other pedal complaints. Denies n/v/f/c.   Past Medical History:  Diagnosis Date   Anemia    Anxiety    Arthritis    Asthma    Cancer (HCC)    COPD (chronic obstructive pulmonary disease) (HCC)    COVID-19 07/16/2020   Emphysema of lung (HCC)    GERD (gastroesophageal reflux disease)    Hypertension    Mycobacterium avium complex (HCC) 10/07/2020   Osteoporosis    Thyroid  disease     Objective:  Physical Exam: Vascular: DP/PT pulses 2/4 bilateral. CFT <3 seconds. Normal hair growth on digits. No edema.  Skin. No lacerations or abrasions bilateral feet. Incurvation of bilateral borders of left hallux. No erythema edema or purulence noted. Tender to palpation.  Musculoskeletal: MMT 5/5 bilateral lower extremities in DF, PF, Inversion and Eversion. Deceased ROM in DF of ankle joint. Bilateral mild HAV deformity noted no pain to medial eminence. There is hypekeratosis and pain noted bilateral medial second digits with hammered rigid second digits bilateral worse on left.  Neurological: Sensation intact to light touch.   Assessment:   1. Ingrown left greater toenail   2. Hammertoe of second toe of left foot   3. Bilateral bunions   4. Hammertoe of second toe of right foot       Plan:  Patient was evaluated and treated and all questions answered. -Xrays reviewed. No acute fractures or dislocations noted. Previous bunion surgery changes noted with reoccurrence of HAV deformity and degenerative changes noted at first MPJ bilateral. Mild hammered second digits bilateral as well.  Toe was evaluated and appears to be healing well.  May  discontinue soaks and neosporin.  Patient to follow-up as needed.   Jennefer Moats, DPM

## 2024-06-03 ENCOUNTER — Ambulatory Visit: Admitting: Medical-Surgical

## 2024-06-03 DIAGNOSIS — M2042 Other hammer toe(s) (acquired), left foot: Secondary | ICD-10-CM | POA: Diagnosis not present

## 2024-06-03 DIAGNOSIS — M79672 Pain in left foot: Secondary | ICD-10-CM | POA: Diagnosis not present

## 2024-06-05 DIAGNOSIS — A310 Pulmonary mycobacterial infection: Secondary | ICD-10-CM | POA: Diagnosis not present

## 2024-06-05 DIAGNOSIS — R9389 Abnormal findings on diagnostic imaging of other specified body structures: Secondary | ICD-10-CM | POA: Diagnosis not present

## 2024-06-06 ENCOUNTER — Other Ambulatory Visit: Payer: Self-pay | Admitting: Medical-Surgical

## 2024-06-08 ENCOUNTER — Other Ambulatory Visit: Payer: Self-pay | Admitting: Medical-Surgical

## 2024-06-08 DIAGNOSIS — G500 Trigeminal neuralgia: Secondary | ICD-10-CM

## 2024-06-13 ENCOUNTER — Other Ambulatory Visit: Payer: Self-pay | Admitting: Medical-Surgical

## 2024-06-13 DIAGNOSIS — L658 Other specified nonscarring hair loss: Secondary | ICD-10-CM

## 2024-06-26 DIAGNOSIS — M2042 Other hammer toe(s) (acquired), left foot: Secondary | ICD-10-CM | POA: Diagnosis not present

## 2024-06-26 DIAGNOSIS — M2012 Hallux valgus (acquired), left foot: Secondary | ICD-10-CM | POA: Diagnosis not present

## 2024-07-01 ENCOUNTER — Other Ambulatory Visit: Payer: Self-pay | Admitting: Medical-Surgical

## 2024-07-01 DIAGNOSIS — E039 Hypothyroidism, unspecified: Secondary | ICD-10-CM

## 2024-07-01 DIAGNOSIS — M2042 Other hammer toe(s) (acquired), left foot: Secondary | ICD-10-CM | POA: Diagnosis not present

## 2024-07-05 DIAGNOSIS — Z4889 Encounter for other specified surgical aftercare: Secondary | ICD-10-CM | POA: Diagnosis not present

## 2024-07-05 DIAGNOSIS — M79672 Pain in left foot: Secondary | ICD-10-CM | POA: Diagnosis not present

## 2024-07-07 ENCOUNTER — Other Ambulatory Visit: Payer: Self-pay | Admitting: Medical-Surgical

## 2024-07-07 DIAGNOSIS — G500 Trigeminal neuralgia: Secondary | ICD-10-CM

## 2024-07-11 ENCOUNTER — Inpatient Hospital Stay: Admitting: Medical-Surgical

## 2024-07-11 DIAGNOSIS — G500 Trigeminal neuralgia: Secondary | ICD-10-CM

## 2024-07-11 NOTE — Progress Notes (Deleted)
        Established patient visit   History of Present Illness   Discussed the use of AI scribe software for clinical note transcription with the patient, who gave verbal consent to proceed.  History of Present Illness            Physical Exam   Physical Exam  Assessment & Plan   Assessment and Plan               Follow up   No follow-ups on file.  __________________________________ Zada FREDRIK Palin, DNP, APRN, FNP-BC Primary Care and Sports Medicine [facility] [place]

## 2024-07-16 DIAGNOSIS — Z4889 Encounter for other specified surgical aftercare: Secondary | ICD-10-CM | POA: Diagnosis not present

## 2024-07-19 DIAGNOSIS — A310 Pulmonary mycobacterial infection: Secondary | ICD-10-CM | POA: Diagnosis not present

## 2024-07-19 DIAGNOSIS — R918 Other nonspecific abnormal finding of lung field: Secondary | ICD-10-CM | POA: Diagnosis not present

## 2024-07-19 DIAGNOSIS — R9389 Abnormal findings on diagnostic imaging of other specified body structures: Secondary | ICD-10-CM | POA: Diagnosis not present

## 2024-07-19 DIAGNOSIS — J432 Centrilobular emphysema: Secondary | ICD-10-CM | POA: Diagnosis not present

## 2024-07-23 DIAGNOSIS — R1312 Dysphagia, oropharyngeal phase: Secondary | ICD-10-CM | POA: Insufficient documentation

## 2024-07-23 DIAGNOSIS — K219 Gastro-esophageal reflux disease without esophagitis: Secondary | ICD-10-CM | POA: Insufficient documentation

## 2024-07-23 DIAGNOSIS — K449 Diaphragmatic hernia without obstruction or gangrene: Secondary | ICD-10-CM | POA: Diagnosis not present

## 2024-07-25 ENCOUNTER — Other Ambulatory Visit: Payer: Self-pay | Admitting: Medical-Surgical

## 2024-07-25 DIAGNOSIS — I10 Essential (primary) hypertension: Secondary | ICD-10-CM

## 2024-07-31 ENCOUNTER — Ambulatory Visit: Admitting: Medical-Surgical

## 2024-07-31 DIAGNOSIS — K219 Gastro-esophageal reflux disease without esophagitis: Secondary | ICD-10-CM | POA: Diagnosis not present

## 2024-07-31 DIAGNOSIS — K449 Diaphragmatic hernia without obstruction or gangrene: Secondary | ICD-10-CM | POA: Diagnosis not present

## 2024-07-31 DIAGNOSIS — I10 Essential (primary) hypertension: Secondary | ICD-10-CM

## 2024-08-02 ENCOUNTER — Other Ambulatory Visit: Payer: Self-pay | Admitting: Medical-Surgical

## 2024-08-06 ENCOUNTER — Ambulatory Visit (INDEPENDENT_AMBULATORY_CARE_PROVIDER_SITE_OTHER): Admitting: Medical-Surgical

## 2024-08-06 ENCOUNTER — Encounter: Payer: Self-pay | Admitting: Medical-Surgical

## 2024-08-06 ENCOUNTER — Encounter: Payer: Self-pay | Admitting: Sports Medicine

## 2024-08-06 VITALS — BP 156/83 | HR 60 | Resp 20 | Ht 60.0 in | Wt 122.0 lb

## 2024-08-06 DIAGNOSIS — E039 Hypothyroidism, unspecified: Secondary | ICD-10-CM

## 2024-08-06 DIAGNOSIS — L658 Other specified nonscarring hair loss: Secondary | ICD-10-CM

## 2024-08-06 DIAGNOSIS — I10 Essential (primary) hypertension: Secondary | ICD-10-CM

## 2024-08-06 DIAGNOSIS — G500 Trigeminal neuralgia: Secondary | ICD-10-CM | POA: Diagnosis not present

## 2024-08-06 DIAGNOSIS — F418 Other specified anxiety disorders: Secondary | ICD-10-CM | POA: Diagnosis not present

## 2024-08-06 MED ORDER — TRELEGY ELLIPTA 100-62.5-25 MCG/ACT IN AEPB: 1.0000 | INHALATION_SPRAY | Freq: Every day | RESPIRATORY_TRACT | 5 refills | Status: AC

## 2024-08-06 MED ORDER — AMLODIPINE BESYLATE 10 MG PO TABS: 10.0000 mg | ORAL_TABLET | Freq: Every day | ORAL | 3 refills | Status: AC

## 2024-08-06 MED ORDER — OMEPRAZOLE 40 MG PO CPDR: 40.0000 mg | DELAYED_RELEASE_CAPSULE | Freq: Every day | ORAL | 3 refills | Status: AC

## 2024-08-06 MED ORDER — CARBAMAZEPINE ER 400 MG PO TB12
400.0000 mg | ORAL_TABLET | Freq: Two times a day (BID) | ORAL | 3 refills | Status: DC
Start: 2024-08-06 — End: 2024-09-27

## 2024-08-06 MED ORDER — VALSARTAN 80 MG PO TABS: 80.0000 mg | ORAL_TABLET | Freq: Every day | ORAL | 3 refills | Status: DC

## 2024-08-06 MED ORDER — FINASTERIDE 5 MG PO TABS: 2.5000 mg | ORAL_TABLET | Freq: Every day | ORAL | 3 refills | Status: DC

## 2024-08-06 NOTE — Patient Instructions (Signed)
 How to Take Your Blood Pressure Blood pressure measures how strongly your blood is pressing against the walls of your arteries. Arteries are blood vessels that carry blood from your heart throughout your body. You can take your blood pressure at home with a machine. You may need to check your blood pressure at home: To check if you have high blood pressure (hypertension). To check your blood pressure over time. To make sure your blood pressure medicine is working. Supplies needed: Blood pressure machine, or monitor. A chair to sit in. This should be a chair where you can sit upright with your back supported. Do not sit on a soft couch or an armchair. Table or desk. Small notebook. Pencil or pen. How to prepare Avoid these things for 30 minutes before checking your blood pressure: Having drinks with caffeine in them, such as coffee or tea. Drinking alcohol. Eating. Smoking. Exercising. Do these things five minutes before checking your blood pressure: Go to the bathroom and pee (urinate). Sit in a chair. Be quiet. Do not talk. How to take your blood pressure Follow the instructions that came with your machine. If you have a digital blood pressure monitor, these may be the instructions: Sit up straight. Place your feet on the floor. Do not cross your ankles or legs. Rest your left arm at the level of your heart. You may rest it on a table, desk, or chair. Pull up your shirt sleeve. Wrap the blood pressure cuff around the upper part of your left arm. The cuff should be 1 inch (2.5 cm) above your elbow. It is best to wrap the cuff around bare skin. Fit the cuff snugly around your arm, but not too tightly. You should be able to place only one finger between the cuff and your arm. Place the cord so that it rests in the bend of your elbow. Press the power button. Sit quietly while the cuff fills with air and loses air. Write down the numbers on the screen. Wait 2-3 minutes and then repeat  steps 1-10. What do the numbers mean? Two numbers make up your blood pressure. The first number is called systolic pressure. The second is called diastolic pressure. An example of a blood pressure reading is "120 over 80" (or 120/80). If you are an adult and do not have a medical condition, use this guide to find out if your blood pressure is normal: Normal First number: below 120. Second number: below 80. Elevated First number: 120-129. Second number: below 80. Hypertension stage 1 First number: 130-139. Second number: 80-89. Hypertension stage 2 First number: 140 or above. Second number: 90 or above. Your blood pressure is above normal even if only the first or only the second number is above normal. Follow these instructions at home: Medicines Take over-the-counter and prescription medicines only as told by your doctor. Tell your doctor if your medicine is causing side effects. General instructions Check your blood pressure as often as your doctor tells you to. Check your blood pressure at the same time every day. Take your monitor to your next doctor's appointment. Your doctor will: Make sure you are using it correctly. Make sure it is working right. Understand what your blood pressure numbers should be. Keep all follow-up visits. General tips You will need a blood pressure machine or monitor. Your doctor can suggest a monitor. You can buy one at a drugstore or online. When choosing one: Choose one with an arm cuff. Choose one that wraps around your  upper arm. Only one finger should fit between your arm and the cuff. Do not choose one that measures your blood pressure from your wrist or finger. Where to find more information American Heart Association: www.heart.org Contact a doctor if: Your blood pressure keeps being high. Your blood pressure is suddenly low. Get help right away if: Your first blood pressure number is higher than 180. Your second blood pressure number is  higher than 120. These symptoms may be an emergency. Do not wait to see if the symptoms will go away. Get help right away. Call 911. Summary Check your blood pressure at the same time every day. Avoid caffeine, alcohol, smoking, and exercise for 30 minutes before checking your blood pressure. Make sure you understand what your blood pressure numbers should be. This information is not intended to replace advice given to you by your health care provider. Make sure you discuss any questions you have with your health care provider. Document Revised: 08/05/2021 Document Reviewed: 08/05/2021 Elsevier Patient Education  2024 [employer].

## 2024-08-06 NOTE — Progress Notes (Signed)
        Established patient visit   History of Present Illness   Discussed the use of AI scribe software for clinical note transcription with the patient, who gave verbal consent to proceed.  History of Present Illness   Margaret [name] is a 78 year old female with a large hiatal hernia who presents with concerns about surgery and ongoing indigestion.  Gastroesophageal symptoms - Large hiatal hernia with ongoing significant indigestion - Currently taking omeprazole  for symptom management - Considering surgical intervention for hiatal hernia - Concerned about potential dietary restrictions following surgery, particularly with upcoming holidays  Hypertension - Elevated blood pressure, attributed to rushing - Currently taking amlodipine  10 mg and valsartan  80 mg for blood pressure control - Does not regularly monitor blood pressure at home despite having a home blood pressure cuff   Trigeminal neuralgia - Taking Carbamazepine  400mg  twice daily, much more effective than the lower dose - No side effects or concerns   Hypothyroidism - Taking levothyroxine  75mcg daily, tolerating well, no SE - No concerning symptoms   Anxiety/Depression - Doing well on Fluoxetine  10mg  daily, effective w/no SE - Denies SI/HI     Physical Exam   Physical Exam Vitals reviewed.  Constitutional:      General: She is not in acute distress.    Appearance: Normal appearance. She is not ill-appearing.  HENT:     Head: Normocephalic and atraumatic.  Cardiovascular:     Rate and Rhythm: Normal rate and regular rhythm.     Pulses: Normal pulses.     Heart sounds: Normal heart sounds. No murmur heard.    No friction rub. No gallop.  Pulmonary:     Effort: Pulmonary effort is normal. No respiratory distress.     Breath sounds: Normal breath sounds. No wheezing.  Skin:    General: Skin is warm and dry.  Neurological:     Mental Status: She is alert and oriented to person, place, and time.   Psychiatric:        Mood and Affect: Mood normal.        Behavior: Behavior normal.        Thought Content: Thought content normal.        Judgment: Judgment normal.    Assessment & Plan   Assessment and Plan    Hiatal hernia and gastroesophageal reflux disease Large hiatal hernia causing significant reflux symptoms. She is considering surgery but has concerns. - Continue omeprazole . - Follow up w/GI as instructed  Essential hypertension Blood pressure elevated, possibly stress related. Not monitoring at home. - Encourage regular home blood pressure monitoring. - Continue amlodipine  10 mg and valsartan  80 mg. - Recheck of BP during visit showing improvement but still a bit higher than recommended.  Trigeminal neuralgia Improvement in facial pain reported. - Continue carbamazepine  400 mg. - Check carbamazepine  level.  Depression/Anxiety Stable and well controlled. - Continue fluoxetine  10mg  daily.   Hypothyroidism Last TSH level normal but prior 2 readings were elevated. Compliant with levothyroxine  dosing.  - Recheck TSH level.  - Continue levothyroxine  75mcg daily.      Follow up   Return in about 6 months (around 02/03/2025) for chronic disease follow up. __________________________________ Zada FREDRIK Palin, DNP, APRN, FNP-BC Primary Care and Sports Medicine [facility] [place]

## 2024-08-07 ENCOUNTER — Ambulatory Visit: Payer: Self-pay | Admitting: Medical-Surgical

## 2024-08-07 DIAGNOSIS — E039 Hypothyroidism, unspecified: Secondary | ICD-10-CM

## 2024-08-07 LAB — CARBAMAZEPINE LEVEL, TOTAL: Carbamazepine (Tegretol), S: 10.7 ug/mL (ref 4.0–12.0)

## 2024-08-07 LAB — TSH: TSH: 7.59 u[IU]/mL — ABNORMAL HIGH (ref 0.450–4.500)

## 2024-08-08 DIAGNOSIS — R1312 Dysphagia, oropharyngeal phase: Secondary | ICD-10-CM | POA: Diagnosis not present

## 2024-08-08 DIAGNOSIS — K449 Diaphragmatic hernia without obstruction or gangrene: Secondary | ICD-10-CM | POA: Diagnosis not present

## 2024-08-09 DIAGNOSIS — Z4889 Encounter for other specified surgical aftercare: Secondary | ICD-10-CM | POA: Diagnosis not present

## 2024-08-10 LAB — FREE THYROXINE + T4
Free T4: 0.79 ng/dL — ABNORMAL LOW (ref 0.82–1.77)
T4, Total: 4.8 ug/dL (ref 4.5–12.0)

## 2024-08-10 LAB — SPECIMEN STATUS REPORT

## 2024-08-11 ENCOUNTER — Other Ambulatory Visit: Payer: Self-pay | Admitting: Medical-Surgical

## 2024-08-11 DIAGNOSIS — E039 Hypothyroidism, unspecified: Secondary | ICD-10-CM

## 2024-08-11 MED ORDER — LEVOTHYROXINE SODIUM 88 MCG PO TABS
88.0000 ug | ORAL_TABLET | Freq: Every day | ORAL | 1 refills | Status: AC
Start: 2024-08-11 — End: ?

## 2024-08-12 ENCOUNTER — Other Ambulatory Visit: Payer: Self-pay

## 2024-08-12 MED ORDER — ALBUTEROL SULFATE HFA 108 (90 BASE) MCG/ACT IN AERS: 1.0000 | INHALATION_SPRAY | RESPIRATORY_TRACT | 0 refills | Status: AC | PRN

## 2024-08-12 NOTE — Telephone Encounter (Signed)
 Patient husband brought in empty container for  albuterol  sulfate HFA  Requesting refill Not showing in patient current medication list  Last written 06/20/2017 Last OV 08/06/2024 Upcoming appt 02/04/2025 After reviewing patient chart forwarding to provider for review before refill sent .

## 2024-08-13 ENCOUNTER — Encounter: Payer: Self-pay | Admitting: Medical-Surgical

## 2024-08-19 ENCOUNTER — Encounter: Payer: Self-pay | Admitting: Medical-Surgical

## 2024-08-19 ENCOUNTER — Ambulatory Visit (INDEPENDENT_AMBULATORY_CARE_PROVIDER_SITE_OTHER): Admitting: Medical-Surgical

## 2024-08-19 ENCOUNTER — Ambulatory Visit: Payer: Self-pay

## 2024-08-19 VITALS — BP 114/72 | HR 66 | Resp 20 | Ht 60.0 in | Wt 116.0 lb

## 2024-08-19 DIAGNOSIS — R42 Dizziness and giddiness: Secondary | ICD-10-CM

## 2024-08-19 NOTE — Telephone Encounter (Signed)
 Patient scheduled.

## 2024-08-19 NOTE — Telephone Encounter (Signed)
 Copied from CRM #[id number]. Topic: Clinical - Red Word Triage >> Aug 19, 2024  8:01 AM Laurier C wrote: Red Word that prompted transfer to Nurse Triage: Patient states she has been sick all week and her blood pressure  has been un normal 107/72. She states when she goes to stand she is feels dizzy Reason for Disposition  [1] Systolic BP 90-110 AND [2] taking blood pressure medications AND [3] feeling weak or lightheaded  Answer Assessment - Initial Assessment Questions 1. BLOOD PRESSURE: What is your blood pressure? Did you take at least two measurements 5 minutes apart?     107/72   102/77 2. ONSET: When did you take your blood pressure?     This am  3. HOW: How did you take your blood pressure? (e.g., visiting nurse, automatic home BP monitor)     Auto BP monito 4. HISTORY: Do you have a history of low blood pressure? What is your blood pressure normally?     No/ 137/67 5. MEDICINES: Are you taking any medicines for blood pressure? If Yes, ask: Have they been changed recently?     yes 7. OTHER SYMPTOMS: Have you been sick recently? Have you had a recent injury?     Last week vomited, dizziness, headache, dizziness with standing  Protocols used: Blood Pressure - Low-A-AH

## 2024-08-19 NOTE — Progress Notes (Signed)
        Established patient visit   History of Present Illness   Discussed the use of AI scribe software for clinical note transcription with the patient, who gave verbal consent to proceed.  History of Present Illness   Margaret [name] is a 78 year old female who presents with dizziness and dehydration symptoms.  Dizziness and orthostatic symptoms - Dizziness present since Saturday, persistent through today - Dizziness occurs when sitting/standing, not lying down - Orthostatic symptoms present - One episode of vomiting after rising from the couch - No palpitations or chest pain  Visual disturbances - Vision described as 'scratchy and squiggly' - Difficulty reading due to visual changes  Hydration and fluid intake - Decreased fluid intake recently - Currently on a special diet in preparation for surgery - Consuming meal replacement shakes x 2, water, and decaffeinated coffee - Drinking less water than usual  Blood pressure changes - Low blood pressure at home - Elevated blood pressure in clinic  Gastrointestinal symptoms - Nausea and dizziness since Tegretol  dose increase - No diarrhea or constipation  Medication changes and adverse effects - Tegretol  dose increased to 600 mg at night on September 10th - Nausea and dizziness began after dose increase - Took extra of the Tegretol  since she thought it was to increase to 800mg  IBD       Physical Exam   Physical Exam Vitals reviewed.  Constitutional:      General: She is not in acute distress.    Appearance: Normal appearance. She is ill-appearing.  HENT:     Head: Normocephalic and atraumatic.  Cardiovascular:     Rate and Rhythm: Normal rate and regular rhythm.     Pulses: Normal pulses.     Heart sounds: Normal heart sounds. No murmur heard.    No friction rub. No gallop.  Pulmonary:     Effort: Pulmonary effort is normal. No respiratory distress.     Breath sounds: Normal breath sounds. No wheezing.   Skin:    General: Skin is warm and dry.  Neurological:     Mental Status: She is alert and oriented to person, place, and time.     Gait: Gait abnormal (some imbalance with ambulation causing slight wobbling).  Psychiatric:        Mood and Affect: Mood normal.        Behavior: Behavior normal.        Thought Content: Thought content normal.        Judgment: Judgment normal.    Assessment & Plan   Assessment and Plan   Dizziness  Dizziness and orthostatic hypotension likely from dehydration and greater than prescribed Tegretol  dosage. Dehydration worsened by low water intake. Tegretol  may alter sodium levels and cause blood abnormalities. - Orthostatic vitals positive for orthostasis. - Check metabolic panel and blood counts. - Advise increased water intake. - Instruct not to exceed 600 mg Tegretol  twice daily. - Encourage rest and monitor symptoms.     Follow up   Return if symptoms worsen or fail to improve. __________________________________ Zada FREDRIK Palin, DNP, APRN, FNP-BC Primary Care and Sports Medicine [facility] [place]

## 2024-08-19 NOTE — Telephone Encounter (Signed)
 Patient seen today with Zada Palin, NP

## 2024-08-20 ENCOUNTER — Ambulatory Visit: Payer: Self-pay | Admitting: Medical-Surgical

## 2024-08-20 LAB — CBC WITH DIFFERENTIAL/PLATELET
Basophils Absolute: 0 x10E3/uL (ref 0.0–0.2)
Basos: 0 %
EOS (ABSOLUTE): 0.1 x10E3/uL (ref 0.0–0.4)
Eos: 1 %
Hematocrit: 38.7 % (ref 34.0–46.6)
Hemoglobin: 13.3 g/dL (ref 11.1–15.9)
Immature Grans (Abs): 0 x10E3/uL (ref 0.0–0.1)
Immature Granulocytes: 0 %
Lymphocytes Absolute: 0.9 x10E3/uL (ref 0.7–3.1)
Lymphs: 11 %
MCH: 33.8 pg — ABNORMAL HIGH (ref 26.6–33.0)
MCHC: 34.4 g/dL (ref 31.5–35.7)
MCV: 98 fL — ABNORMAL HIGH (ref 79–97)
Monocytes Absolute: 0.6 x10E3/uL (ref 0.1–0.9)
Monocytes: 7 %
Neutrophils Absolute: 6.3 x10E3/uL (ref 1.4–7.0)
Neutrophils: 80 %
Platelets: 261 x10E3/uL (ref 150–450)
RBC: 3.94 x10E6/uL (ref 3.77–5.28)
RDW: 12.6 % (ref 11.7–15.4)
WBC: 7.9 x10E3/uL (ref 3.4–10.8)

## 2024-08-20 LAB — CMP14+EGFR
ALT: 19 IU/L (ref 0–32)
AST: 21 IU/L (ref 0–40)
Albumin: 4.5 g/dL (ref 3.8–4.8)
Alkaline Phosphatase: 97 IU/L (ref 49–135)
BUN/Creatinine Ratio: 21 (ref 12–28)
BUN: 13 mg/dL (ref 8–27)
Bilirubin Total: 0.2 mg/dL (ref 0.0–1.2)
CO2: 20 mmol/L (ref 20–29)
Calcium: 9.5 mg/dL (ref 8.7–10.3)
Chloride: 92 mmol/L — ABNORMAL LOW (ref 96–106)
Creatinine, Ser: 0.62 mg/dL (ref 0.57–1.00)
Globulin, Total: 2.1 g/dL (ref 1.5–4.5)
Glucose: 102 mg/dL — ABNORMAL HIGH (ref 70–99)
Potassium: 4.5 mmol/L (ref 3.5–5.2)
Sodium: 127 mmol/L — ABNORMAL LOW (ref 134–144)
Total Protein: 6.6 g/dL (ref 6.0–8.5)
eGFR: 92 mL/min/1.73 (ref >59)

## 2024-08-23 DIAGNOSIS — K449 Diaphragmatic hernia without obstruction or gangrene: Secondary | ICD-10-CM | POA: Diagnosis not present

## 2024-08-23 DIAGNOSIS — Z7409 Other reduced mobility: Secondary | ICD-10-CM | POA: Diagnosis not present

## 2024-08-23 DIAGNOSIS — I10 Essential (primary) hypertension: Secondary | ICD-10-CM | POA: Diagnosis not present

## 2024-08-23 DIAGNOSIS — G500 Trigeminal neuralgia: Secondary | ICD-10-CM | POA: Diagnosis not present

## 2024-08-23 DIAGNOSIS — E039 Hypothyroidism, unspecified: Secondary | ICD-10-CM | POA: Diagnosis not present

## 2024-08-23 DIAGNOSIS — J449 Chronic obstructive pulmonary disease, unspecified: Secondary | ICD-10-CM | POA: Diagnosis not present

## 2024-08-23 DIAGNOSIS — R1312 Dysphagia, oropharyngeal phase: Secondary | ICD-10-CM | POA: Diagnosis not present

## 2024-08-23 DIAGNOSIS — E222 Syndrome of inappropriate secretion of antidiuretic hormone: Secondary | ICD-10-CM | POA: Diagnosis not present

## 2024-08-23 DIAGNOSIS — K219 Gastro-esophageal reflux disease without esophagitis: Secondary | ICD-10-CM | POA: Diagnosis not present

## 2024-08-23 DIAGNOSIS — F419 Anxiety disorder, unspecified: Secondary | ICD-10-CM | POA: Diagnosis not present

## 2024-08-23 DIAGNOSIS — E871 Hypo-osmolality and hyponatremia: Secondary | ICD-10-CM | POA: Diagnosis not present

## 2024-08-23 DIAGNOSIS — F32A Depression, unspecified: Secondary | ICD-10-CM | POA: Diagnosis not present

## 2024-08-23 DIAGNOSIS — Z87891 Personal history of nicotine dependence: Secondary | ICD-10-CM | POA: Diagnosis not present

## 2024-08-24 DIAGNOSIS — E871 Hypo-osmolality and hyponatremia: Secondary | ICD-10-CM | POA: Diagnosis not present

## 2024-08-26 ENCOUNTER — Telehealth: Payer: Self-pay

## 2024-08-26 NOTE — Transitions of Care (Post Inpatient/ED Visit) (Signed)
   08/26/2024  Name: Margaret [name] MRN: [id number] DOB: 01/18/1946  Today's TOC FU Call Status: Today's TOC FU Call Status:: Unsuccessful Call (1st Attempt) Unsuccessful Call (1st Attempt) Date: 08/26/24  Attempted to reach the patient regarding the most recent Inpatient/ED visit.  Follow Up Plan: Additional outreach attempts will be made to reach the patient to complete the Transitions of Care (Post Inpatient/ED visit) call.   Shona Prow RN, CCM [place]  VBCI-Population Health RN Care Manager [phone]

## 2024-08-27 NOTE — Transitions of Care (Post Inpatient/ED Visit) (Signed)
   08/27/2024  Name: Margaret [name] MRN: [id number] DOB: [birth year]  Today's TOC FU Call Status: Today's TOC FU Call Status:: Unsuccessful Call (2nd Attempt) Unsuccessful Call (2nd Attempt) Date: 08/27/24  Attempted to reach the patient regarding the most recent Inpatient/ED visit.  Follow Up Plan: Additional outreach attempts will be made to reach the patient to complete the Transitions of Care (Post Inpatient/ED visit) call.   Shona Prow RN, CCM [place]  VBCI-Population Health RN Care Manager [phone]

## 2024-08-28 ENCOUNTER — Telehealth: Payer: Self-pay

## 2024-08-28 NOTE — Transitions of Care (Post Inpatient/ED Visit) (Signed)
   08/28/2024  Name: Margaret [name] MRN: [id number] DOB: 06-10-1946  Today's TOC FU Call Status: Today's TOC FU Call Status:: Successful TOC FU Call Completed TOC FU Call Complete Date: 08/28/24 (Patient states she is seeing her PCP tomorrow for hospital follow up and is going to have labs, states she understands her medications, does not feel like talking and denies need for [facility]) Patient's Name and Date of Birth confirmed.    Shona Prow RN, CCM [place]  VBCI-Population Health RN Care Manager [phone]

## 2024-08-29 ENCOUNTER — Encounter: Payer: Self-pay | Admitting: Medical-Surgical

## 2024-08-29 ENCOUNTER — Ambulatory Visit (INDEPENDENT_AMBULATORY_CARE_PROVIDER_SITE_OTHER): Admitting: Medical-Surgical

## 2024-08-29 VITALS — BP 155/81 | HR 69 | Resp 20 | Ht 60.0 in | Wt 118.0 lb

## 2024-08-29 DIAGNOSIS — E871 Hypo-osmolality and hyponatremia: Secondary | ICD-10-CM

## 2024-08-29 DIAGNOSIS — D3610 Benign neoplasm of peripheral nerves and autonomic nervous system, unspecified: Secondary | ICD-10-CM | POA: Diagnosis not present

## 2024-08-29 DIAGNOSIS — Z09 Encounter for follow-up examination after completed treatment for conditions other than malignant neoplasm: Secondary | ICD-10-CM

## 2024-08-29 MED ORDER — GABAPENTIN 300 MG PO CAPS: 300.0000 mg | ORAL_CAPSULE | Freq: Three times a day (TID) | ORAL | 3 refills | Status: DC

## 2024-08-29 NOTE — Patient Instructions (Addendum)
 Take Gabapentin  300mg  nightly for 4 days then increase to 300mg  twice daily.

## 2024-08-29 NOTE — Progress Notes (Signed)
        Established patient visit   History of Present Illness   Discussed the use of AI scribe software for clinical note transcription with the patient, who gave verbal consent to proceed.mri  History of Present Illness   Margaret [name] is a 78 year old female with trigeminal neuralgia who presents with concerns about medication side effects and sodium levels.  Trigeminal neuralgia symptoms and functional impact - Sharp, stabbing, lightning-type pain primarily in the right jaw, previously affecting the lip and cheek - Pain causes difficulty with eating and talking - Recurrence of symptoms when medication efficacy wanes  Carbamazepine  (tegretol ) efficacy and adverse effects - Continues regular use of carbamazepine  for trigeminal neuralgia - Initial relief with carbamazepine , but diminished efficacy over time leading to symptom recurrence and the requirement for higher doses - Recent hospitalization for critically low sodium, corrected to 130 mEq/L - Concern regarding ongoing risk of hyponatremia  Prior interventions for neuropathic pain - History of gamma knife radiation for schwannoma  Gabapentin  use and tolerability - Used gabapentin  for nerve pain after toe surgery for approximately one week - No jaw pain during gabapentin  use - Initial drowsiness with gabapentin , but no significant side effects     Physical Exam   Physical Exam Vitals reviewed.  Constitutional:      General: She is not in acute distress.    Appearance: Normal appearance.  HENT:     Head: Normocephalic and atraumatic.  Cardiovascular:     Rate and Rhythm: Normal rate and regular rhythm.     Pulses: Normal pulses.     Heart sounds: Normal heart sounds. No murmur heard.    No friction rub. No gallop.  Pulmonary:     Effort: Pulmonary effort is normal. No respiratory distress.     Breath sounds: Normal breath sounds. No wheezing.  Skin:    General: Skin is warm and dry.  Neurological:      Mental Status: She is alert and oriented to person, place, and time.  Psychiatric:        Mood and Affect: Mood normal.        Behavior: Behavior normal.        Thought Content: Thought content normal.        Judgment: Judgment normal.    Assessment & Plan     Trigeminal neuralgia due to cranial nerve schwannoma Chronic trigeminal neuralgia with worsening symptoms, concern for possible changes in the schwannoma. Previous gamma knife radiation performed. Current medication, carbamazepine  600 mg twice daily, suspected to cause hyponatremia and not be effective anymore. - Discontinue carbamazepine  with slow taper over 8 weeks. - Initiate gabapentin , starting at 300 mg at night, then increase to twice daily if tolerated. - Order repeat MRI to assess schwannoma status.  Hyponatremia secondary to carbamazepine  therapy Recent hospitalization for critically low sodium levels, likely due to carbamazepine . Sodium levels improved to 130 mEq/L. - Recheck sodium levels today.     Follow up   Return in about 4 weeks (around 09/26/2024) for Trigeminal neuralgia/hyponatremia follow-up. __________________________________ Zada FREDRIK Palin, DNP, APRN, FNP-BC Primary Care and Sports Medicine [facility]

## 2024-08-30 ENCOUNTER — Ambulatory Visit: Payer: Self-pay | Admitting: Medical-Surgical

## 2024-08-30 DIAGNOSIS — E871 Hypo-osmolality and hyponatremia: Secondary | ICD-10-CM

## 2024-08-30 LAB — BASIC METABOLIC PANEL WITH GFR
BUN/Creatinine Ratio: 16 (ref 12–28)
BUN: 10 mg/dL (ref 8–27)
CO2: 20 mmol/L (ref 20–29)
Calcium: 9.1 mg/dL (ref 8.7–10.3)
Chloride: 87 mmol/L — ABNORMAL LOW (ref 96–106)
Creatinine, Ser: 0.63 mg/dL (ref 0.57–1.00)
Glucose: 93 mg/dL (ref 70–99)
Potassium: 4.7 mmol/L (ref 3.5–5.2)
Sodium: 124 mmol/L — ABNORMAL LOW (ref 134–144)
eGFR: 91 mL/min/1.73 (ref >59)

## 2024-09-02 DIAGNOSIS — E871 Hypo-osmolality and hyponatremia: Secondary | ICD-10-CM | POA: Diagnosis not present

## 2024-09-02 DIAGNOSIS — Z923 Personal history of irradiation: Secondary | ICD-10-CM | POA: Diagnosis not present

## 2024-09-02 DIAGNOSIS — D3610 Benign neoplasm of peripheral nerves and autonomic nervous system, unspecified: Secondary | ICD-10-CM | POA: Diagnosis not present

## 2024-09-03 LAB — BASIC METABOLIC PANEL WITH GFR
BUN/Creatinine Ratio: 26 (ref 12–28)
BUN: 15 mg/dL (ref 8–27)
CO2: 22 mmol/L (ref 20–29)
Calcium: 9.1 mg/dL (ref 8.7–10.3)
Chloride: 92 mmol/L — ABNORMAL LOW (ref 96–106)
Creatinine, Ser: 0.58 mg/dL (ref 0.57–1.00)
Glucose: 100 mg/dL — ABNORMAL HIGH (ref 70–99)
Potassium: 4.6 mmol/L (ref 3.5–5.2)
Sodium: 129 mmol/L — ABNORMAL LOW (ref 134–144)
eGFR: 93 mL/min/1.73 (ref >59)

## 2024-09-11 ENCOUNTER — Ambulatory Visit: Payer: Self-pay

## 2024-09-11 ENCOUNTER — Encounter: Payer: Self-pay | Admitting: Family Medicine

## 2024-09-11 ENCOUNTER — Ambulatory Visit (INDEPENDENT_AMBULATORY_CARE_PROVIDER_SITE_OTHER): Admitting: Family Medicine

## 2024-09-11 VITALS — BP 169/97 | HR 69 | Ht 60.0 in | Wt 119.0 lb

## 2024-09-11 DIAGNOSIS — W19XXXA Unspecified fall, initial encounter: Secondary | ICD-10-CM | POA: Diagnosis not present

## 2024-09-11 DIAGNOSIS — M25521 Pain in right elbow: Secondary | ICD-10-CM | POA: Insufficient documentation

## 2024-09-11 DIAGNOSIS — E871 Hypo-osmolality and hyponatremia: Secondary | ICD-10-CM | POA: Diagnosis not present

## 2024-09-11 DIAGNOSIS — I10 Essential (primary) hypertension: Secondary | ICD-10-CM

## 2024-09-11 NOTE — Assessment & Plan Note (Addendum)
 Blood pressure is elevated today but having some pain related to recent fall.  She will plan to follow-up with PCP in about 2 weeks

## 2024-09-11 NOTE — Assessment & Plan Note (Signed)
 We discussed recommendation to wean from carbamazepine  per neurosurgery as well as PCPs recommendations.  She remains hesitant to do this.  Will have her wean from fluoxetine .  Recheck sodium levels today.  Follow-up with PCP in about 2 weeks.

## 2024-09-11 NOTE — Telephone Encounter (Signed)
 FYI Only or Action Required?: Action required by provider: request for appointment.  Patient was last seen in primary care on 08/29/2024 by Willo Mini, NP.  Called Nurse Triage reporting Fall.  Symptoms began yesterday.  Interventions attempted: Nothing.  Symptoms are: gradually worsening.  Triage Disposition: See HCP Within 4 Hours (Or PCP Triage)  Patient/caregiver understands and will follow disposition?: YesCopied from CRM #[id number]. Topic: Clinical - Red Word Triage >> Sep 11, 2024  8:17 AM Margaret [name] wrote: Red Word that prompted transfer to Nurse Triage: Margaret [name] yesterday, has a black eye and bruising. Reason for Disposition  [1] MODERATE weakness (e.g., interferes with work, school, normal activities) AND [2] new-onset or getting worse  Answer Assessment - Initial Assessment Questions I've been falling a lot lately. Yesterday I fell due to dizziness and landed on both knees/right elbow and my head it the shudder. Woke up with black eye. I think it's my sodium level. It's been low. Pt is not on blood thinners. Pt denies headache/visual changes.  RN advised someone drive and help pt walk in. Pt verbalized understanding.        1. MECHANISM: How did the fall happen?     dizzy 2. DOMESTIC VIOLENCE AND ELDER ABUSE SCREENING: Did you fall because someone pushed you or tried to hurt you? If Yes, ask: Are you safe now?     na 3. ONSET: When did the fall happen? (e.g., minutes, hours, or days ago)     Yesterday  4. LOCATION: What part of the body hit the ground? (e.g., back, buttocks, head, hips, knees, hands, head, stomach)     Knees and right elbow 5. INJURY: Did you hurt (injure) yourself when you fell? If Yes, ask: What did you injure? Tell me more about this? (e.g., body area; type of injury; pain severity)     Elbow and both needs. Head hit a shudder 6. PAIN: Is there any pain? If Yes, ask: How bad is the pain? (e.g., Scale 0-10; or none, mild,       denies 7. SIZE: For cuts, bruises, or swelling, ask: How large is it? (e.g., inches or centimeters)  Left black eye 9. OTHER SYMPTOMS: Do you have any other symptoms? (e.g., dizziness, fever, weakness; new-onset or worsening).      dizziness 10. CAUSE: What do you think caused the fall (or falling)? (e.g., dizzy spell, tripped)       Dizziness  Protocols used: Falls and [facility]

## 2024-09-11 NOTE — Progress Notes (Signed)
 Margaret [name] - 78 y.o. female MRN [id number]  Date of birth: 14-May-1946  Subjective Chief Complaint  Patient presents with   Fall    HPI Margaret [name] is a 78 y.o. female here today is a 78 y.o. female here today with complaint of recent fall.  She has had recurrent episodes of dizziness.  Most recently was outdoors and felt dizzy on her patio.  Fell hitting her right elbow and knee as well as her left eye on her shutter.  No loss of consciousness.  She did have hematoma over the left eye yesterday with more bruising around the eye today.  She denies any vision changes.  She does have some pain along the left paraspinal muscles of the neck.  She denies any nausea.  No dizziness today.  Her dizziness is not worsened by postural change.  She has had hyponatremia recently.  Recommend that she wean from Tegretol  however she states she is unable to do this due to her trigeminal neuralgia.  Her neurosurgeon did start her on Decadron  taper but she is still afraid to come off her Tegretol .  She is also on fluoxetine  for anxiety.  ROS:  A comprehensive ROS was completed and negative except as noted per HPI  Allergies  Allergen Reactions   Bacitracin-Neomycin-Polymyxin Anaphylaxis and Hives   Neomycin-Bacitracin Zn-Polymyx Anaphylaxis   Clarithromycin Rash and Other (See Comments)    THRUSH     Ofloxacin Hives, Nausea And Vomiting, Rash and Other (See Comments)    GI     Other Other (See Comments)    ANTIBIOTIC OINT - UNSURE OF NAME  Hives, GI upset    Raloxifene  Other (See Comments)    Back pain/ PT NOT SURE ABOUT THIS REACTION      Sulfa  Antibiotics Other (See Comments)    GI UPSET    Tetracycline Other (See Comments) and Nausea And Vomiting   Bacitracin    Neosporin [Neomycin-Polymyxin-Gramicidin] Hives   Polymyxin B     Past Medical History:  Diagnosis Date   Anemia    Anxiety    Arthritis    Asthma    Cancer (HCC)    COPD (chronic obstructive pulmonary disease) (HCC)     COVID-19 07/16/2020   Emphysema of lung (HCC)    GERD (gastroesophageal reflux disease)    Hypertension    Mycobacterium avium complex (HCC) 10/07/2020   Osteoporosis    Thyroid  disease     Past Surgical History:  Procedure Laterality Date   ABDOMINAL HYSTERECTOMY     BREAST SURGERY     CESAREAN SECTION     JOINT REPLACEMENT     LT KNEE   TOTAL KNEE ARTHROPLASTY  2006    Social History   Socioeconomic History   Marital status: Married    Spouse name: Not on file   Number of children: Not on file   Years of education: Not on file   Highest education level: 12th grade  Occupational History   Not on file  Tobacco Use   Smoking status: Former    Current packs/day: 0.00    Average packs/day: 1.5 packs/day for 40.0 years (60.0 ttl pk-yrs)    Types: Cigarettes    Start date: 02/01/1968    Quit date: 02/01/2008    Years since quitting: 16.6   Smokeless tobacco: Never   Tobacco comments:    Chew Nicorette Gum  Vaping Use   Vaping status: Never Used  Substance and Sexual Activity   Alcohol  use: Yes  Comment: SOCIALLY   Drug use: No   Sexual activity: Yes    Birth control/protection: None  Other Topics Concern   Not on file  Social History Narrative   ** Merged History Encounter **       Social Drivers of Health   Financial Resource Strain: Low Risk  (08/05/2024)   Overall Financial Resource Strain (CARDIA)    Difficulty of Paying Living Expenses: Not hard at all  Food Insecurity: No Food Insecurity (08/24/2024)   [facility] from [facility]   Hunger Vital Sign    Within the past 12 months, you worried that your food would run out before you got the money to buy more.: Never true    Within the past 12 months, the food you bought just didn't last and you didn't have money to get more.: Never true  Transportation Needs: No Transportation Needs (08/24/2024)   Received from [facility] - Transportation    In the past 12 months, has lack of transportation kept  you from medical appointments or from getting medications?: No    In the past 12 months, has lack of transportation kept you from meetings, work, or from getting things needed for daily living?: No  Physical Activity: Inactive (08/05/2024)   Exercise Vital Sign    Days of Exercise per Week: 0 days    Minutes of Exercise per Session: Not on file  Stress: No Stress Concern Present (08/24/2024)   Received from [facility] - Occupational Stress Questionnaire    Do you feel stress - tense, restless, nervous, or anxious, or unable to sleep at night because your mind is troubled all the time - these days?: Not at all  Social Connections: Unknown (08/05/2024)   Social Connection and Isolation Panel    Frequency of Communication with Friends and Family: More than three times a week    Frequency of Social Gatherings with Friends and Family: Twice a week    Attends Religious Services: More than 4 times per year    Active Member of [employer] or Organizations: Not on file    Attends Banker Meetings: Not on file    Marital Status: Married    Family History  Problem Relation Age of Onset   Stroke Other    Emphysema Mother 7       Death cause was emphysema.   Alcohol  abuse Father    COPD Sister    Alcohol  abuse Brother 93   Cirrhosis Brother    Cancer Maternal Grandmother 21       Stomach   Stroke Maternal Grandfather 59    Health Maintenance  Topic Date Due   Zoster Vaccines- Shingrix (1 of 2) Never done   Medicare Annual Wellness (AWV)  02/06/2018   COVID-19 Vaccine (3 - Pfizer risk series) 09/14/2024 (Originally 04/14/2020)   Influenza Vaccine  03/04/2025 (Originally 07/05/2024)   DTaP/Tdap/Td (3 - Td or Tdap) 05/26/2028   Pneumococcal Vaccine: 50+ Years  Completed   DEXA SCAN  Completed   Hepatitis C Screening  Completed   Meningococcal B Vaccine  Aged Out   Mammogram  Discontinued   Colonoscopy  Discontinued      ----------------------------------------------------------------------------------------------------------------------------------------------------------------------------------------------------------------- Physical Exam BP (!) 169/97 (BP Location: Left Arm, Patient Position: Sitting, Cuff Size: Small)   Pulse 69   Ht 5' (1.524 m)   Wt 119 lb (54 kg)   SpO2 99%   BMI 23.24 kg/m   Physical  Exam Constitutional:      Appearance: Normal appearance.  Eyes:     General: No scleral icterus. Cardiovascular:     Rate and Rhythm: Normal rate and regular rhythm.  Pulmonary:     Effort: Pulmonary effort is normal.     Breath sounds: Normal breath sounds.  Neurological:     General: No focal deficit present.     Mental Status: She is alert and oriented to person, place, and time.     Cranial Nerves: No cranial nerve deficit.     Motor: No weakness.  Psychiatric:        Mood and Affect: Mood normal.        Behavior: Behavior normal.     ------------------------------------------------------------------------------------------------------------------------------------------------------------------------------------------------------------------- Assessment and Plan  Hyponatremia We discussed recommendation to wean from carbamazepine  per neurosurgery as well as PCPs recommendations.  She remains hesitant to do this.  Will have her wean from fluoxetine .  Recheck sodium levels today.  Follow-up with PCP in about 2 weeks.  Essential hypertension, benign Blood pressure is elevated today but having some pain related to recent fall.  She will plan to follow-up with PCP in about 2 weeks  Right elbow pain X-rays of right elbow ordered.  Fall Recent fall related to dizziness.  No syncopal episodes.  Likely related to her hyponatremia.  Some bruising around the eye but neurologically intact.   No orders of the defined types were placed in this encounter.   Return in about 2 weeks  (around 09/25/2024) for f/u PCP for fall and low Na+.

## 2024-09-11 NOTE — Assessment & Plan Note (Signed)
 Recent fall related to dizziness.  No syncopal episodes.  Likely related to her hyponatremia.  Some bruising around the eye but neurologically intact.

## 2024-09-11 NOTE — Telephone Encounter (Signed)
 The patient is scheduled on 09/11/24 with Dr. Alvia for the following symptoms.

## 2024-09-11 NOTE — Assessment & Plan Note (Signed)
 X-rays of right elbow ordered.

## 2024-09-11 NOTE — Progress Notes (Signed)
 Pt fell on 09/10/24 while walking with drinks in her hand around furniture. States she'd been dizzy all day.  Marti hit her head on the open shutters in the window (not the ledge). She remembers the fall but not what caused her to fall.  Injuries include Hematoma over left eye. R knee skin tear. R elbow injury. L Arm bruise. Patient states she feels twitching in her hands (unrelated to fall).

## 2024-09-11 NOTE — Patient Instructions (Signed)
 Taper from carbamazepine  as directed previously.   Taper from fluoxetine : Take every other day x10 days then stop.

## 2024-09-12 LAB — BASIC METABOLIC PANEL WITH GFR
BUN/Creatinine Ratio: 21 (ref 12–28)
BUN: 16 mg/dL (ref 8–27)
CO2: 22 mmol/L (ref 20–29)
Calcium: 9.5 mg/dL (ref 8.7–10.3)
Chloride: 93 mmol/L — ABNORMAL LOW (ref 96–106)
Creatinine, Ser: 0.78 mg/dL (ref 0.57–1.00)
Glucose: 100 mg/dL — ABNORMAL HIGH (ref 70–99)
Potassium: 4.5 mmol/L (ref 3.5–5.2)
Sodium: 131 mmol/L — ABNORMAL LOW (ref 134–144)
eGFR: 78 mL/min/1.73 (ref >59)

## 2024-09-12 LAB — CBC WITH DIFFERENTIAL/PLATELET
Basophils Absolute: 0 x10E3/uL (ref 0.0–0.2)
Basos: 0 %
EOS (ABSOLUTE): 0.1 x10E3/uL (ref 0.0–0.4)
Eos: 1 %
Hematocrit: 37.5 % (ref 34.0–46.6)
Hemoglobin: 12.7 g/dL (ref 11.1–15.9)
Immature Grans (Abs): 0.1 x10E3/uL (ref 0.0–0.1)
Immature Granulocytes: 1 %
Lymphocytes Absolute: 1.1 x10E3/uL (ref 0.7–3.1)
Lymphs: 13 %
MCH: 33.9 pg — ABNORMAL HIGH (ref 26.6–33.0)
MCHC: 33.9 g/dL (ref 31.5–35.7)
MCV: 100 fL — ABNORMAL HIGH (ref 79–97)
Monocytes Absolute: 0.7 x10E3/uL (ref 0.1–0.9)
Monocytes: 9 %
Neutrophils Absolute: 6.2 x10E3/uL (ref 1.4–7.0)
Neutrophils: 76 %
Platelets: 280 x10E3/uL (ref 150–450)
RBC: 3.75 x10E6/uL — ABNORMAL LOW (ref 3.77–5.28)
RDW: 12.5 % (ref 11.7–15.4)
WBC: 8.2 x10E3/uL (ref 3.4–10.8)

## 2024-09-12 LAB — CORTISOL: Cortisol: 16.7 ug/dL (ref 6.2–19.4)

## 2024-09-13 ENCOUNTER — Ambulatory Visit: Payer: Self-pay | Admitting: Family Medicine

## 2024-09-16 ENCOUNTER — Ambulatory Visit

## 2024-09-16 DIAGNOSIS — R519 Headache, unspecified: Secondary | ICD-10-CM | POA: Diagnosis not present

## 2024-09-16 DIAGNOSIS — D320 Benign neoplasm of cerebral meninges: Secondary | ICD-10-CM | POA: Diagnosis not present

## 2024-09-16 DIAGNOSIS — D3610 Benign neoplasm of peripheral nerves and autonomic nervous system, unspecified: Secondary | ICD-10-CM

## 2024-09-16 DIAGNOSIS — E871 Hypo-osmolality and hyponatremia: Secondary | ICD-10-CM

## 2024-09-16 DIAGNOSIS — Z8542 Personal history of malignant neoplasm of other parts of uterus: Secondary | ICD-10-CM | POA: Diagnosis not present

## 2024-09-16 DIAGNOSIS — G500 Trigeminal neuralgia: Secondary | ICD-10-CM | POA: Diagnosis not present

## 2024-09-16 DIAGNOSIS — Z09 Encounter for follow-up examination after completed treatment for conditions other than malignant neoplasm: Secondary | ICD-10-CM

## 2024-09-16 MED ORDER — GADOBUTROL 1 MMOL/ML IV SOLN
5.4000 mL | Freq: Once | INTRAVENOUS | Status: AC | PRN
  Administered 2024-09-16: 5.4 mL via INTRAVENOUS

## 2024-09-18 DIAGNOSIS — G500 Trigeminal neuralgia: Secondary | ICD-10-CM | POA: Diagnosis not present

## 2024-09-18 DIAGNOSIS — R519 Headache, unspecified: Secondary | ICD-10-CM | POA: Diagnosis not present

## 2024-09-18 DIAGNOSIS — D3610 Benign neoplasm of peripheral nerves and autonomic nervous system, unspecified: Secondary | ICD-10-CM | POA: Diagnosis not present

## 2024-09-25 NOTE — Progress Notes (Unsigned)
 Established patient visit   History of Present Illness   Discussed the use of AI scribe software for clinical note transcription with the patient, who gave verbal consent to proceed.  History of Present Illness   Margaret [name] is a 78 year old female with hypertension who presents with concerns about high blood pressure and medication management.  Hypertension and blood pressure monitoring - Elevated blood pressure readings, attributed to prednisone  use and pain - Blood pressure was notably high this morning - Does not regularly monitor blood pressure at home - Normal blood pressure reading on the fifteenth of the month  Glucocorticoid use and pain management - Prednisone  alleviates pain but is short term  Anticonvulsant medication management - Previous regimen of carbamazepine  600mg  helpful but caused hyponatremia - Transitioned to gabapentin  300mg  TID, which is perceived as ineffective, self-discontinued  Fall risk and prevention - History of falls - No recent falls since starting to drink electrolytes  Vaginal pruritus - Occasional vaginal itching - Uses Desonide 0.05% cream as needed, prescribed by gynecologist a while ago - Current supply of cream is nearly depleted and requests refill  Dyspnea and nebulizer use - Uses an albuterol  inhaler for shortness of breath - Requests solution for nebulizer use - Increased shortness of breath recently     Physical Exam   Physical Exam Vitals reviewed.  Constitutional:      General: She is not in acute distress.    Appearance: Normal appearance.  HENT:     Head: Normocephalic and atraumatic.  Cardiovascular:     Rate and Rhythm: Normal rate and regular rhythm.     Pulses: Normal pulses.     Heart sounds: Normal heart sounds. No murmur heard.    No friction rub. No gallop.  Pulmonary:     Effort: Pulmonary effort is normal. No respiratory distress.     Breath sounds: Normal breath sounds. No wheezing.   Skin:    General: Skin is warm and dry.     Findings: Bruising (left eye bruise) present.  Neurological:     Mental Status: She is alert and oriented to person, place, and time.  Psychiatric:        Mood and Affect: Mood normal.        Behavior: Behavior normal.        Thought Content: Thought content normal.        Judgment: Judgment normal.    Assessment & Plan   Problem List Items Addressed This Visit       Cardiovascular and Mediastinum   Essential hypertension, benign (Chronic)   Possible prednisone  contribution. BP elevated last visit and remains elevated today.  - Monitor blood pressure at home. - Discuss prednisone 's impact on blood pressure. - Continue Amlodipine  10mg  daily.  - Increasing Valsartan  to 160mg  daily.       Relevant Medications   valsartan  (DIOVAN ) 160 MG tablet     Respiratory   Mild persistent asthma with acute exacerbation   Managed with Albuterol  inhaler. Has a nebulizer machine but no solution. Increased episodes reported. - Adding albuterol  nebulizer solution. - Avoid using the nebulizer and inhaler together.       Relevant Medications   albuterol  (PROVENTIL ) (2.5 MG/3ML) 0.083% nebulizer solution     Nervous and Auditory   Trigeminal neuralgia of right side of face   Management suboptimal. Carbamazepine  caused hyponatremia and subsequent falls and hospital admission. Gabapentin  ineffective. Second Gamma Knife procedure scheduled but patient  cancelled. - Recheck sodium levels. - Restart carbamazepine  600 mg twice daily if sodium stable. - Consider oxcarbamazepine if sodium low.        Other   Hyponatremia - Primary   Hyponatremia monitored. - Recheck sodium levels. - Advise continued electrolyte intake.      Relevant Orders   Basic Metabolic Panel (BMET)   Vaginal irritation   Intermittent pruritus managed with Desowen. - Continue prn use of Desowen, refill sent.      Follow up   Return in about 2 weeks (around 10/10/2024)  for facial pain follow up with Dr. Alvia (transferring care and already verified). __________________________________ Zada FREDRIK Palin, DNP, APRN, FNP-BC Primary Care and Sports Medicine [facility] [place]

## 2024-09-26 ENCOUNTER — Encounter: Payer: Self-pay | Admitting: Medical-Surgical

## 2024-09-26 ENCOUNTER — Ambulatory Visit (INDEPENDENT_AMBULATORY_CARE_PROVIDER_SITE_OTHER): Admitting: Medical-Surgical

## 2024-09-26 VITALS — BP 166/79 | HR 58 | Resp 20 | Ht 60.0 in | Wt 115.0 lb

## 2024-09-26 DIAGNOSIS — G500 Trigeminal neuralgia: Secondary | ICD-10-CM | POA: Diagnosis not present

## 2024-09-26 DIAGNOSIS — J4531 Mild persistent asthma with (acute) exacerbation: Secondary | ICD-10-CM

## 2024-09-26 DIAGNOSIS — E871 Hypo-osmolality and hyponatremia: Secondary | ICD-10-CM

## 2024-09-26 DIAGNOSIS — I10 Essential (primary) hypertension: Secondary | ICD-10-CM | POA: Diagnosis not present

## 2024-09-26 DIAGNOSIS — N898 Other specified noninflammatory disorders of vagina: Secondary | ICD-10-CM | POA: Diagnosis not present

## 2024-09-26 MED ORDER — VALSARTAN 160 MG PO TABS: 160.0000 mg | ORAL_TABLET | Freq: Every day | ORAL | 0 refills | Status: DC

## 2024-09-26 MED ORDER — DESONIDE 0.05 % EX CREA: TOPICAL_CREAM | Freq: Two times a day (BID) | CUTANEOUS | 0 refills | Status: DC

## 2024-09-26 MED ORDER — ALBUTEROL SULFATE (2.5 MG/3ML) 0.083% IN NEBU: 2.5000 mg | INHALATION_SOLUTION | Freq: Four times a day (QID) | RESPIRATORY_TRACT | 3 refills | Status: AC | PRN

## 2024-09-26 NOTE — Assessment & Plan Note (Signed)
 Managed with Albuterol  inhaler. Has a nebulizer machine but no solution. Increased episodes reported. - Adding albuterol  nebulizer solution. - Avoid using the nebulizer and inhaler together.

## 2024-09-26 NOTE — Assessment & Plan Note (Signed)
 Possible prednisone  contribution. BP elevated last visit and remains elevated today.  - Monitor blood pressure at home. - Discuss prednisone 's impact on blood pressure. - Continue Amlodipine  10mg  daily.  - Increasing Valsartan  to 160mg  daily.

## 2024-09-26 NOTE — Assessment & Plan Note (Signed)
 Hyponatremia monitored. - Recheck sodium levels. - Advise continued electrolyte intake.

## 2024-09-26 NOTE — Assessment & Plan Note (Signed)
 Intermittent pruritus managed with Desowen. - Continue prn use of Desowen, refill sent.

## 2024-09-26 NOTE — Assessment & Plan Note (Signed)
 Management suboptimal. Carbamazepine  caused hyponatremia and subsequent falls and hospital admission. Gabapentin  ineffective. Second Gamma Knife procedure scheduled but patient cancelled. - Recheck sodium levels. - Restart carbamazepine  600 mg twice daily if sodium stable. - Consider oxcarbamazepine if sodium low.

## 2024-09-27 ENCOUNTER — Other Ambulatory Visit: Payer: Self-pay | Admitting: Medical-Surgical

## 2024-09-27 ENCOUNTER — Ambulatory Visit: Payer: Self-pay | Admitting: Medical-Surgical

## 2024-09-27 DIAGNOSIS — G500 Trigeminal neuralgia: Secondary | ICD-10-CM

## 2024-09-27 LAB — BASIC METABOLIC PANEL WITH GFR
BUN/Creatinine Ratio: 32 — ABNORMAL HIGH (ref 12–28)
BUN: 24 mg/dL (ref 8–27)
CO2: 24 mmol/L (ref 20–29)
Calcium: 9.7 mg/dL (ref 8.7–10.3)
Chloride: 99 mmol/L (ref 96–106)
Creatinine, Ser: 0.76 mg/dL (ref 0.57–1.00)
Glucose: 96 mg/dL (ref 70–99)
Potassium: 4.3 mmol/L (ref 3.5–5.2)
Sodium: 137 mmol/L (ref 134–144)
eGFR: 81 mL/min/1.73 (ref >59)

## 2024-09-27 MED ORDER — CARBAMAZEPINE ER 400 MG PO TB12: 400.0000 mg | ORAL_TABLET | Freq: Two times a day (BID) | ORAL | 1 refills | Status: DC

## 2024-09-27 MED ORDER — CARBAMAZEPINE ER 200 MG PO TB12: 200.0000 mg | ORAL_TABLET | Freq: Two times a day (BID) | ORAL | 0 refills | Status: DC

## 2024-09-30 DIAGNOSIS — M1711 Unilateral primary osteoarthritis, right knee: Secondary | ICD-10-CM | POA: Diagnosis not present

## 2024-09-30 DIAGNOSIS — M25561 Pain in right knee: Secondary | ICD-10-CM | POA: Diagnosis not present

## 2024-09-30 MED ORDER — CARBAMAZEPINE ER 200 MG PO TB12: 200.0000 mg | ORAL_TABLET | Freq: Two times a day (BID) | ORAL | 0 refills | Status: DC

## 2024-09-30 MED ORDER — CARBAMAZEPINE ER 400 MG PO TB12: 400.0000 mg | ORAL_TABLET | Freq: Two times a day (BID) | ORAL | 1 refills | Status: DC

## 2024-09-30 MED ORDER — DESONIDE 0.05 % EX CREA: TOPICAL_CREAM | Freq: Two times a day (BID) | CUTANEOUS | 0 refills | Status: DC

## 2024-10-04 DIAGNOSIS — Z4889 Encounter for other specified surgical aftercare: Secondary | ICD-10-CM | POA: Diagnosis not present

## 2024-10-10 ENCOUNTER — Ambulatory Visit (INDEPENDENT_AMBULATORY_CARE_PROVIDER_SITE_OTHER): Admitting: Family Medicine

## 2024-10-10 VITALS — BP 125/74 | HR 78 | Ht 60.0 in | Wt 117.0 lb

## 2024-10-10 DIAGNOSIS — E871 Hypo-osmolality and hyponatremia: Secondary | ICD-10-CM | POA: Diagnosis not present

## 2024-10-10 DIAGNOSIS — F411 Generalized anxiety disorder: Secondary | ICD-10-CM | POA: Diagnosis not present

## 2024-10-10 DIAGNOSIS — G500 Trigeminal neuralgia: Secondary | ICD-10-CM | POA: Diagnosis not present

## 2024-10-10 NOTE — Assessment & Plan Note (Signed)
 Previously on fluoxetine  however we held this due to her hyponatremia.  She has other family members that are doing quite well with sertraline which she would be interested in trying..  We can do a trial of this if her sodium levels look okay.  Alternatives include BuSpar.

## 2024-10-10 NOTE — Progress Notes (Signed)
 Margaret [name] - 78 y.o. female MRN [id number]  Date of birth: 1946-05-27  Subjective Chief Complaint  Patient presents with   Medication Problem   Anxiety    HPI Margaret [name] is a 78 y.o. female here today for appt to establish with new provider.   She has had problems with hyponatremia 2/2 to tegretol  and/or fluoxetine  use.  She has been tapered from both of these.  Last sodium was 137.  She was going to undergo gamma knife treatment for trigeminal neuralgia but decided against this.  She has resumed carbamazepine  at this time.  Feels ok at this time.    She has had some increased anxiety.  Other family members are on sertraline and this works very well for them.  She is interested in trying this.   ROS:  A comprehensive ROS was completed and negative except as noted per HPI  Allergies  Allergen Reactions   Bacitracin-Neomycin-Polymyxin Anaphylaxis and Hives   Neomycin-Bacitracin Zn-Polymyx Anaphylaxis   Clarithromycin Rash and Other (See Comments)    THRUSH     Ofloxacin Hives, Nausea And Vomiting, Rash and Other (See Comments)    GI     Other Other (See Comments)    ANTIBIOTIC OINT - UNSURE OF NAME  Hives, GI upset    Raloxifene  Other (See Comments)    Back pain/ PT NOT SURE ABOUT THIS REACTION      Sulfa  Antibiotics Other (See Comments)    GI UPSET    Tetracycline Other (See Comments) and Nausea And Vomiting   Bacitracin    Neosporin [Neomycin-Polymyxin-Gramicidin] Hives   Polymyxin B     Past Medical History:  Diagnosis Date   Anemia    Anxiety    Arthritis    Asthma    Cancer (HCC)    COPD (chronic obstructive pulmonary disease) (HCC)    COVID-19 07/16/2020   Emphysema of lung (HCC)    GERD (gastroesophageal reflux disease)    Hypertension    Mycobacterium avium complex (HCC) 10/07/2020   Osteoporosis    Thyroid  disease     Past Surgical History:  Procedure Laterality Date   ABDOMINAL HYSTERECTOMY     BREAST SURGERY     CESAREAN SECTION      JOINT REPLACEMENT     LT KNEE   TOTAL KNEE ARTHROPLASTY  2006    Social History   Socioeconomic History   Marital status: Married    Spouse name: Not on file   Number of children: Not on file   Years of education: Not on file   Highest education level: 12th grade  Occupational History   Not on file  Tobacco Use   Smoking status: Former    Current packs/day: 0.00    Average packs/day: 1.5 packs/day for 40.0 years (60.0 ttl pk-yrs)    Types: Cigarettes    Start date: 02/01/1968    Quit date: 02/01/2008    Years since quitting: 16.7   Smokeless tobacco: Never   Tobacco comments:    Chew Nicorette Gum  Vaping Use   Vaping status: Never Used  Substance and Sexual Activity   Alcohol  use: Yes    Comment: SOCIALLY   Drug use: No   Sexual activity: Yes    Birth control/protection: None  Other Topics Concern   Not on file  Social History Narrative   ** Merged History Encounter **       Social Drivers of Health   Financial Resource Strain: Low Risk  (10/10/2024)  Overall Financial Resource Strain (CARDIA)    Difficulty of Paying Living Expenses: Not hard at all  Food Insecurity: No Food Insecurity (10/10/2024)   Hunger Vital Sign    Worried About Running Out of Food in the Last Year: Never true    Ran Out of Food in the Last Year: Never true  Transportation Needs: No Transportation Needs (10/10/2024)   PRAPARE - Administrator, Civil Service (Medical): No    Lack of Transportation (Non-Medical): No  Physical Activity: Inactive (10/10/2024)   Exercise Vital Sign    Days of Exercise per Week: 0 days    Minutes of Exercise per Session: Not on file  Stress: No Stress Concern Present (10/10/2024)   [employer] of Occupational Health - Occupational Stress Questionnaire    Feeling of Stress: Only a little  Social Connections: Socially Integrated (10/10/2024)   Social Connection and Isolation Panel    Frequency of Communication with Friends and Family: More than  three times a week    Frequency of Social Gatherings with Friends and Family: More than three times a week    Attends Religious Services: More than 4 times per year    Active Member of [employer] or Organizations: Yes    Attends Engineer, Structural: More than 4 times per year    Marital Status: Married    Family History  Problem Relation Age of Onset   Stroke Other    Emphysema Mother 73       Death cause was emphysema.   Alcohol  abuse Father    COPD Sister    Alcohol  abuse Brother 18   Cirrhosis Brother    Cancer Maternal Grandmother 34       Stomach   Stroke Maternal Grandfather 42    Health Maintenance  Topic Date Due   Medicare Annual Wellness (AWV)  02/06/2018   COVID-19 Vaccine (3 - Pfizer risk series) 10/12/2024 (Originally 04/14/2020)   Zoster Vaccines- Shingrix (1 of 2) 01/10/2025 (Originally 01/09/1966)   Influenza Vaccine  03/04/2025 (Originally 07/05/2024)   DTaP/Tdap/Td (3 - Td or Tdap) 05/26/2028   Pneumococcal Vaccine: 50+ Years  Completed   DEXA SCAN  Completed   Hepatitis C Screening  Completed   Meningococcal B Vaccine  Aged Out   Mammogram  Discontinued   Colonoscopy  Discontinued     ----------------------------------------------------------------------------------------------------------------------------------------------------------------------------------------------------------------- Physical Exam BP 125/74 (BP Location: Left Arm, Patient Position: Sitting, Cuff Size: Small)   Pulse 78   Ht 5' (1.524 m)   Wt 117 lb (53.1 kg)   SpO2 98%   BMI 22.85 kg/m   Physical Exam Constitutional:      Appearance: Normal appearance.  HENT:     Head: Normocephalic.     Comments: Still with bruising around L eye from previous fall.  Cardiovascular:     Rate and Rhythm: Normal rate and regular rhythm.  Pulmonary:     Effort: Pulmonary effort is normal.     Breath sounds: Normal breath sounds.  Musculoskeletal:     Cervical back: Neck supple.   Neurological:     General: No focal deficit present.     Mental Status: She is alert.  Psychiatric:        Mood and Affect: Mood normal.        Behavior: Behavior normal.     ------------------------------------------------------------------------------------------------------------------------------------------------------------------------------------------------------------------- Assessment and Plan  Hyponatremia Recurrent hyponatremia related to Tegretol  and/or SSRI use.  Improved on previous check however she has restarted Tegretol .  Rechecking today.  Trigeminal neuralgia of right side of face Fairly well-controlled with Tegretol  however may be contributing to hyponatremia.  We can see if we can get eslicarbazepine covered if hyponatremia remains.    GAD (generalized anxiety disorder) Previously on fluoxetine  however we held this due to her hyponatremia.  She has other family members that are doing quite well with sertraline which she would be interested in trying..  We can do a trial of this if her sodium levels look okay.  Alternatives include BuSpar.   No orders of the defined types were placed in this encounter.   Return in about 3 months (around 01/10/2025) for Trigeminal neuralgia, Anxiety.

## 2024-10-10 NOTE — Assessment & Plan Note (Signed)
 Recurrent hyponatremia related to Tegretol  and/or SSRI use.  Improved on previous check however she has restarted Tegretol .  Rechecking today.

## 2024-10-10 NOTE — Assessment & Plan Note (Signed)
 Fairly well-controlled with Tegretol  however may be contributing to hyponatremia.  We can see if we can get eslicarbazepine covered if hyponatremia remains.

## 2024-10-11 LAB — BASIC METABOLIC PANEL WITH GFR
BUN/Creatinine Ratio: 22 (ref 12–28)
BUN: 16 mg/dL (ref 8–27)
CO2: 23 mmol/L (ref 20–29)
Calcium: 9.2 mg/dL (ref 8.7–10.3)
Chloride: 95 mmol/L — ABNORMAL LOW (ref 96–106)
Creatinine, Ser: 0.72 mg/dL (ref 0.57–1.00)
Glucose: 111 mg/dL — ABNORMAL HIGH (ref 70–99)
Potassium: 4 mmol/L (ref 3.5–5.2)
Sodium: 132 mmol/L — ABNORMAL LOW (ref 134–144)
eGFR: 86 mL/min/1.73 (ref >59)

## 2024-10-21 MED ORDER — DESONIDE 0.05 % EX CREA: TOPICAL_CREAM | Freq: Two times a day (BID) | CUTANEOUS | 0 refills | Status: DC

## 2024-10-21 MED ORDER — CARBAMAZEPINE ER 400 MG PO TB12
400.0000 mg | ORAL_TABLET | Freq: Two times a day (BID) | ORAL | 1 refills | Status: DC
Start: 2024-10-21 — End: 2024-10-23

## 2024-10-22 ENCOUNTER — Ambulatory Visit: Payer: Self-pay

## 2024-10-22 NOTE — Telephone Encounter (Signed)
 FYI Only or Action Required?: Action required by provider: medication clarification -- please see 'reason for dispo' notes.  Patient was last seen in primary care on 10/10/2024 by Alvia Bring, DO.  Called Nurse Triage reporting Medication Problem.  Prescription medications: Tegratol, fluoxetine .  Triage Disposition: Call PCP When Office is Open  Patient/caregiver understands and will follow disposition?: No, wishes to speak with PCP    Missing zoloft - pt has never started Needs to get off carbamazepine  Reason for Disposition  Caller wants to use a complementary or alternative medicine    Pt recently saw PCP 2 weeks ago and discussed above medications. Pt has been dealing with low sodium levels and pt was under the impression that PCP was going to switch her FLUoxetine  (PROZAC ) to Zoloft as well as carbamazepine  (TEGRETOL  XR) to an alternate medication that would not affect her sodium levels.  Per PCP last encounter note on 10/10/24, pt claims seem consistent with provider notes. Triager will forward encounter for Dr Donnice 's office to review and advise. Patient verbalized understanding and is expecting call back from office for next steps.  Answer Assessment - Initial Assessment Questions 1. NAME of MEDICINE: What medicine(s) are you calling about?     carbamazepine  (TEGRETOL  XR) 400 MG FLUoxetine  (PROZAC ) 2. QUESTION: What is your question? (e.g., double dose of medicine, side effect)     Pt recently saw PCP 2 weeks ago and discussed above medications. Pt has been dealing with low sodium levels and pt was under the impression that PCP was going to switch her FLUoxetine  (PROZAC ) to Zoloft as well as carbamazepine  (TEGRETOL  XR) to an alternate medication that would not affect her sodium levels. Pt reports that new medications are not at the pharmacy and endorsed that carbamazepine  (TEGRETOL  XR) has been refilled instead of changed to alternate Rx.  Pt is very confused with plan of  care and would like PCP to clarify Rx. 3. PRESCRIBER: Who prescribed the medicine? Reason: if prescribed by specialist, call should be referred to that group.     PCP 4. SYMPTOMS: Do you have any symptoms? If Yes, ask: What symptoms are you having?  How bad are the symptoms (e.g., mild, moderate, severe)     Anxiety d/t not knowing if she should be on fluoxetine  or zoloft. 5. PREGNANCY:  Is there any chance that you are pregnant? When was your last menstrual period?     N/a  Protocols used: Medication Question Call-A-AH

## 2024-10-23 ENCOUNTER — Ambulatory Visit: Payer: Self-pay | Admitting: Family Medicine

## 2024-10-23 ENCOUNTER — Other Ambulatory Visit: Payer: Self-pay | Admitting: Family Medicine

## 2024-10-23 MED ORDER — ESLICARBAZEPINE ACETATE 600 MG PO TABS: ORAL_TABLET | ORAL | 3 refills | Status: AC

## 2024-10-23 MED ORDER — SERTRALINE HCL 50 MG PO TABS: 50.0000 mg | ORAL_TABLET | Freq: Every day | ORAL | 3 refills | Status: DC

## 2024-10-23 NOTE — Telephone Encounter (Signed)
 Patient informed and will pick up medication today at [facility].

## 2024-10-24 ENCOUNTER — Telehealth: Payer: Self-pay

## 2024-10-24 ENCOUNTER — Other Ambulatory Visit (HOSPITAL_COMMUNITY): Payer: Self-pay

## 2024-10-24 NOTE — Telephone Encounter (Signed)
-----   Message from Velma Ku sent at 10/23/2024  5:18 PM EST ----- Regarding: FW: Your message may not be read Contact: [phone] Message returned.  Please make sure patient is aware of recommendations. ----- Message ----- From: Mychart, Generic Sent: 10/23/2024   5:07 PM EST To: Velma Ku, DO Subject: Your message may not be read                      ----- Delivery failure of internet email alert  Tickler type: Message Message Id(WMG): [id number] SMTP Response: [id number] Patient: [name],Margaret([id number]) Internet alert email: [email or web address]     ----- Original WMG message to the patient ----- Sent: 10/23/2024  5:07 PM From: Ku Velma, DO To: [name],[name] Message Type: Test Results Message Subject: Message about your results Eward,   Sodium remains a little low.  The sertraline to replace fluoxextine and new medication to replace carbamazepine  called eslicarbazepine has been sent over.  I want to recheck sodium levels after about 3-4 weeks on the new medications.   CM

## 2024-10-24 NOTE — Telephone Encounter (Signed)
 Pharmacy Patient Advocate Encounter   Received notification from CoverMyMeds that prior authorization for Desonide  0.05% cream is required/requested.   Insurance verification completed.   The patient is insured through [place].   Per test claim:  Triamcinolone , Mometasone, Fluticasone propionate, Betamethasone  valerate, Hydrocortisone, Clobetasol cream is preferred by the insurance.  If suggested medication is appropriate, Please send in a new RX and discontinue this one. If not, please advise as to why it's not appropriate so that we may request a Prior Authorization. Please note, some preferred medications may still require a PA.  If the suggested medications have not been trialed and there are no contraindications to their use, the PA will not be submitted, as it will not be approved.     Key: [id number]

## 2024-10-24 NOTE — Telephone Encounter (Signed)
 Patient is aware of results and recommendations. Voiced her understanding Will come in to have labs rechecked.

## 2024-11-14 ENCOUNTER — Ambulatory Visit: Admitting: Family Medicine

## 2024-11-14 ENCOUNTER — Encounter: Payer: Self-pay | Admitting: Family Medicine

## 2024-11-14 ENCOUNTER — Telehealth: Payer: Self-pay | Admitting: Family Medicine

## 2024-11-14 VITALS — BP 132/77 | HR 73 | Ht 60.0 in | Wt 114.0 lb

## 2024-11-14 DIAGNOSIS — Z23 Encounter for immunization: Secondary | ICD-10-CM | POA: Diagnosis not present

## 2024-11-14 DIAGNOSIS — G8929 Other chronic pain: Secondary | ICD-10-CM | POA: Diagnosis not present

## 2024-11-14 DIAGNOSIS — E871 Hypo-osmolality and hyponatremia: Secondary | ICD-10-CM

## 2024-11-14 DIAGNOSIS — M545 Low back pain, unspecified: Secondary | ICD-10-CM | POA: Diagnosis not present

## 2024-11-14 DIAGNOSIS — G500 Trigeminal neuralgia: Secondary | ICD-10-CM | POA: Diagnosis not present

## 2024-11-14 MED ORDER — HYDROCODONE-ACETAMINOPHEN 5-325 MG PO TABS: 1.0000 | ORAL_TABLET | Freq: Three times a day (TID) | ORAL | 0 refills | Status: DC | PRN

## 2024-11-14 NOTE — Assessment & Plan Note (Signed)
 Referral to pain clinic.  #15 tabs of 5/325mg  hydrocodone  sent in for now.

## 2024-11-14 NOTE — Progress Notes (Signed)
 [name] - 78 y.o. female MRN [id number]  Date of birth: [birth year]  Subjective Chief Complaint  Patient presents with   Back Pain    HPI Margaret [name] is a 78 y.o. female here today for follow up.   She reports that she is doing ok.    Prescribed Eslicarbazepine to replace carbamazepine  due to hyponatremia.  She reports that she was unable to get this.  Her pharmacist told her they were waiting on authorization from us .    She also has chronic low back pain.  History of fusion x2.  She was previously on norco and then changed to using CBD cream which helped up until recently.  She is interested in seeing pain clinic again.  She requests a few norco until able to see pain clinic.   ROS:  A comprehensive ROS was completed and negative except as noted per HPI    Allergies[1]  Past Medical History:  Diagnosis Date   Anemia    Anxiety    Arthritis    Asthma    Cancer (HCC)    COPD (chronic obstructive pulmonary disease) (HCC)    COVID-19 07/16/2020   Emphysema of lung (HCC)    GERD (gastroesophageal reflux disease)    Hypertension    Mycobacterium avium complex (HCC) 10/07/2020   Osteoporosis    Thyroid  disease     Past Surgical History:  Procedure Laterality Date   ABDOMINAL HYSTERECTOMY     BREAST SURGERY     CESAREAN SECTION     JOINT REPLACEMENT     LT KNEE   TOTAL KNEE ARTHROPLASTY  2006    Social History   Socioeconomic History   Marital status: Married    Spouse name: Not on file   Number of children: Not on file   Years of education: Not on file   Highest education level: 12th grade  Occupational History   Not on file  Tobacco Use   Smoking status: Former    Current packs/day: 0.00    Average packs/day: 1.5 packs/day for 40.0 years (60.0 ttl pk-yrs)    Types: Cigarettes    Start date: 02/01/1968    Quit date: 02/01/2008    Years since quitting: 16.7   Smokeless tobacco: Never   Tobacco comments:    Chew Nicorette Gum  Vaping Use   Vaping  status: Never Used  Substance and Sexual Activity   Alcohol  use: Yes    Comment: SOCIALLY   Drug use: No   Sexual activity: Yes    Birth control/protection: None  Other Topics Concern   Not on file  Social History Narrative   ** Merged History Encounter **       Social Drivers of Health   Tobacco Use: Medium Risk (11/14/2024)   Patient History    Smoking Tobacco Use: Former    Smokeless Tobacco Use: Never    Passive Exposure: Not on Actuary Strain: Low Risk (10/10/2024)   Overall Financial Resource Strain (CARDIA)    Difficulty of Paying Living Expenses: Not hard at all  Food Insecurity: No Food Insecurity (10/10/2024)   Epic    Worried About Radiation Protection Practitioner of Food in the Last Year: Never true    Ran Out of Food in the Last Year: Never true  Transportation Needs: No Transportation Needs (10/10/2024)   Epic    Lack of Transportation (Medical): No    Lack of Transportation (Non-Medical): No  Physical Activity: Inactive (10/10/2024)   Exercise Vital Sign  Days of Exercise per Week: 0 days    Minutes of Exercise per Session: Not on file  Stress: No Stress Concern Present (10/10/2024)   [employer] of Occupational Health - Occupational Stress Questionnaire    Feeling of Stress: Only a little  Social Connections: Socially Integrated (10/10/2024)   Social Connection and Isolation Panel    Frequency of Communication with Friends and Family: More than three times a week    Frequency of Social Gatherings with Friends and Family: More than three times a week    Attends Religious Services: More than 4 times per year    Active Member of Clubs or Organizations: Yes    Attends Banker Meetings: More than 4 times per year    Marital Status: Married  Depression (PHQ2-9): Low Risk (09/26/2024)   Depression (PHQ2-9)    PHQ-2 Score: 4  Alcohol  Screen: Low Risk (10/10/2024)   Alcohol  Screen    Last Alcohol  Screening Score (AUDIT): 3  Housing: Low Risk  (10/10/2024)   Epic    Unable to Pay for Housing in the Last Year: No    Number of Times Moved in the Last Year: 0    Homeless in the Last Year: No  Utilities: Not At Risk (10/09/2024)   Received from [facility]    In the past 12 months has the electric, gas, oil, or water company threatened to shut off services in your home?: No  Health Literacy: Adequate Health Literacy (11/14/2024)   B1300 Health Literacy    Frequency of need for help with medical instructions: Never    Family History  Problem Relation Age of Onset   Stroke Other    Emphysema Mother 83       Death cause was emphysema.   Alcohol  abuse Father    COPD Sister    Alcohol  abuse Brother 95   Cirrhosis Brother    Cancer Maternal Grandmother 65       Stomach   Stroke Maternal Grandfather 66    Health Maintenance  Topic Date Due   Medicare Annual Wellness (AWV)  02/06/2018   Zoster Vaccines- Shingrix (1 of 2) 01/10/2025 (Originally 01/09/1966)   Influenza Vaccine  03/04/2025 (Originally 07/05/2024)   COVID-19 Vaccine (3 - Pfizer risk series) 11/30/2025 (Originally 04/14/2020)   DTaP/Tdap/Td (3 - Td or Tdap) 05/26/2028   Pneumococcal Vaccine: 50+ Years  Completed   Bone Density Scan  Completed   Hepatitis C Screening  Completed   Meningococcal B Vaccine  Aged Out   Mammogram  Discontinued   Colonoscopy  Discontinued     ----------------------------------------------------------------------------------------------------------------------------------------------------------------------------------------------------------------- Physical Exam BP 132/77 (BP Location: Left Arm, Patient Position: Sitting, Cuff Size: Small)   Pulse 73   Ht 5' (1.524 m)   Wt 114 lb (51.7 kg)   SpO2 96%   BMI 22.26 kg/m   Physical Exam Constitutional:      Appearance: Normal appearance.  Eyes:     General: No scleral icterus. Cardiovascular:     Rate and Rhythm: Normal rate and regular rhythm.  Pulmonary:     Effort:  Pulmonary effort is normal.     Breath sounds: Normal breath sounds.  Musculoskeletal:     Cervical back: Neck supple.  Neurological:     Mental Status: She is alert.  Psychiatric:        Mood and Affect: Mood normal.        Behavior: Behavior normal.     ------------------------------------------------------------------------------------------------------------------------------------------------------------------------------------------------------------------- Assessment and Plan  Hyponatremia Rechecking BMP.  Ideally would like to change to eslicarbazepine for trigeminal neuralgia due to carbamazepine  contributing to recurrent hyponatremia.   Chronic midline low back pain without sciatica Referral to pain clinic.  #15 tabs of 5/325mg  hydrocodone  sent in for now.    Meds ordered this encounter  Medications   HYDROcodone -acetaminophen  (NORCO/VICODIN) 5-325 MG tablet    Sig: Take 1 tablet by mouth every 8 (eight) hours as needed for moderate pain (pain score 4-6).    Dispense:  15 tablet    Refill:  0    No follow-ups on file.        [1]  Allergies Allergen Reactions   Bacitracin-Neomycin-Polymyxin Anaphylaxis and Hives   Neomycin-Bacitracin Zn-Polymyx Anaphylaxis   Clarithromycin Rash and Other (See Comments)    THRUSH     Ofloxacin Hives, Nausea And Vomiting, Rash and Other (See Comments)    GI     Other Other (See Comments)    ANTIBIOTIC OINT - UNSURE OF NAME  Hives, GI upset    Raloxifene  Other (See Comments)    Back pain/ PT NOT SURE ABOUT THIS REACTION      Sulfa  Antibiotics Other (See Comments)    GI UPSET    Tetracycline Other (See Comments) and Nausea And Vomiting   Bacitracin    Neosporin [Neomycin-Polymyxin-Gramicidin] Hives   Polymyxin B

## 2024-11-14 NOTE — Telephone Encounter (Signed)
 Patient states that she will need her handicap placard renewed by the first of the year

## 2024-11-14 NOTE — Patient Instructions (Signed)
 Take pain medication 1-2 times per day as needed.  We'll let you know about the new medication.

## 2024-11-14 NOTE — Assessment & Plan Note (Signed)
 Rechecking BMP.  Ideally would like to change to eslicarbazepine for trigeminal neuralgia due to carbamazepine  contributing to recurrent hyponatremia.

## 2024-11-15 ENCOUNTER — Ambulatory Visit: Payer: Self-pay | Admitting: Family Medicine

## 2024-11-15 ENCOUNTER — Other Ambulatory Visit: Payer: Self-pay | Admitting: Family Medicine

## 2024-11-15 LAB — BASIC METABOLIC PANEL WITH GFR
BUN/Creatinine Ratio: 21 (ref 12–28)
BUN: 16 mg/dL (ref 8–27)
CO2: 25 mmol/L (ref 20–29)
Calcium: 9.4 mg/dL (ref 8.7–10.3)
Chloride: 97 mmol/L (ref 96–106)
Creatinine, Ser: 0.78 mg/dL (ref 0.57–1.00)
Glucose: 91 mg/dL (ref 70–99)
Potassium: 4.7 mmol/L (ref 3.5–5.2)
Sodium: 136 mmol/L (ref 134–144)
eGFR: 78 mL/min/1.73 (ref >59)

## 2024-11-15 NOTE — Telephone Encounter (Signed)
 Patient has been advised DMV Disability form ready for pick-up. She was [place] to the beach and will pick-up on return.

## 2024-11-21 NOTE — Telephone Encounter (Signed)
 Closed encounter due to no response

## 2024-12-12 ENCOUNTER — Ambulatory Visit (INDEPENDENT_AMBULATORY_CARE_PROVIDER_SITE_OTHER): Admitting: Family Medicine

## 2024-12-12 ENCOUNTER — Encounter: Payer: Self-pay | Admitting: Family Medicine

## 2024-12-12 VITALS — BP 134/73 | HR 61 | Ht 60.0 in | Wt 116.0 lb

## 2024-12-12 DIAGNOSIS — E871 Hypo-osmolality and hyponatremia: Secondary | ICD-10-CM

## 2024-12-12 DIAGNOSIS — J4531 Mild persistent asthma with (acute) exacerbation: Secondary | ICD-10-CM | POA: Diagnosis not present

## 2024-12-12 DIAGNOSIS — F411 Generalized anxiety disorder: Secondary | ICD-10-CM

## 2024-12-12 DIAGNOSIS — I10 Essential (primary) hypertension: Secondary | ICD-10-CM

## 2024-12-12 DIAGNOSIS — M48061 Spinal stenosis, lumbar region without neurogenic claudication: Secondary | ICD-10-CM

## 2024-12-12 DIAGNOSIS — J4530 Mild persistent asthma, uncomplicated: Secondary | ICD-10-CM | POA: Diagnosis not present

## 2024-12-12 MED ORDER — HYDROCODONE-ACETAMINOPHEN 5-325 MG PO TABS: 1.0000 | ORAL_TABLET | Freq: Three times a day (TID) | ORAL | 0 refills | Status: AC | PRN

## 2024-12-12 NOTE — Assessment & Plan Note (Addendum)
 Previously on fluoxetine  but was suspected this is contributing to her hyponatremia as well.  Sodium levels have improved some since change to sertraline .

## 2024-12-12 NOTE — Assessment & Plan Note (Signed)
 Blood pressure is well-controlled.  Continue amlodipine  at current strength.

## 2024-12-12 NOTE — Assessment & Plan Note (Signed)
 Continued low back pain.  Continue gabapentin  as needed.  He has upcoming visit with spine center for pain management.  I did provide a few additional hydrocodone  for now.

## 2024-12-12 NOTE — Assessment & Plan Note (Signed)
 Continue Trelegy at current strength.  Albuterol  as needed.

## 2024-12-12 NOTE — Progress Notes (Signed)
 " Margaret [name] - 79 y.o. female MRN [id number]  Date of birth: 01/30/1946  Subjective Chief Complaint  Patient presents with   Medication Refill    HPI Margaret [name] is a 79 y.o. female here today for follow up visit.   She reports she is doing okay.  She does have an appointment for hiatal hernia repair in February.  She also has appointment with pain clinic at [facility].  She does have chronic back pain.  Previous spinal fusion from T10-S1.  Continues on gabapentin .  Using hydrocodone  on rare occasion if needed.    She continues on carbamazepine  for history of trigeminal neuralgia.  She has had recurrent episodes of hyponatremia with carbamazepine .  She reports that insurance has not covered eslicarbazepine so far.  Asthma remains well-controlled with Trelegy daily and albuterol  as needed.    ROS:  A comprehensive ROS was completed and negative except as noted per HPI     Allergies[1]  Past Medical History:  Diagnosis Date   Anemia    Anxiety    Arthritis    Asthma    Cancer (HCC)    COPD (chronic obstructive pulmonary disease) (HCC)    COVID-19 07/16/2020   Emphysema of lung (HCC)    GERD (gastroesophageal reflux disease)    Hypertension    Mycobacterium avium complex (HCC) 10/07/2020   Osteoporosis    Thyroid  disease     Past Surgical History:  Procedure Laterality Date   ABDOMINAL HYSTERECTOMY     BREAST SURGERY     CESAREAN SECTION     JOINT REPLACEMENT     LT KNEE   TOTAL KNEE ARTHROPLASTY  2006    Social History   Socioeconomic History   Marital status: Married    Spouse name: Not on file   Number of children: Not on file   Years of education: Not on file   Highest education level: 12th grade  Occupational History   Not on file  Tobacco Use   Smoking status: Former    Current packs/day: 0.00    Average packs/day: 1.5 packs/day for 40.0 years (60.0 ttl pk-yrs)    Types: Cigarettes    Start date: 02/01/1968    Quit date: 02/01/2008    Years since  quitting: 16.8   Smokeless tobacco: Never   Tobacco comments:    Chew Nicorette Gum  Vaping Use   Vaping status: Never Used  Substance and Sexual Activity   Alcohol  use: Yes    Comment: SOCIALLY   Drug use: No   Sexual activity: Yes    Birth control/protection: None  Other Topics Concern   Not on file  Social History Narrative   ** Merged History Encounter **       Social Drivers of Health   Tobacco Use: Medium Risk (12/12/2024)   Patient History    Smoking Tobacco Use: Former    Smokeless Tobacco Use: Never    Passive Exposure: Not on Actuary Strain: Low Risk (10/10/2024)   Overall Financial Resource Strain (CARDIA)    Difficulty of Paying Living Expenses: Not hard at all  Food Insecurity: No Food Insecurity (10/10/2024)   Epic    Worried About Radiation Protection Practitioner of Food in the Last Year: Never true    Ran Out of Food in the Last Year: Never true  Transportation Needs: No Transportation Needs (10/10/2024)   Epic    Lack of Transportation (Medical): No    Lack of Transportation (Non-Medical): No  Physical Activity:  Inactive (10/10/2024)   Exercise Vital Sign    Days of Exercise per Week: 0 days    Minutes of Exercise per Session: Not on file  Stress: No Stress Concern Present (10/10/2024)   [employer] of Occupational Health - Occupational Stress Questionnaire    Feeling of Stress: Only a little  Social Connections: Socially Integrated (10/10/2024)   Social Connection and Isolation Panel    Frequency of Communication with Friends and Family: More than three times a week    Frequency of Social Gatherings with Friends and Family: More than three times a week    Attends Religious Services: More than 4 times per year    Active Member of Clubs or Organizations: Yes    Attends Banker Meetings: More than 4 times per year    Marital Status: Married  Depression (PHQ2-9): Low Risk (09/26/2024)   Depression (PHQ2-9)    PHQ-2 Score: 4  Alcohol  Screen:  Low Risk (10/10/2024)   Alcohol  Screen    Last Alcohol  Screening Score (AUDIT): 3  Housing: Low Risk (10/10/2024)   Epic    Unable to Pay for Housing in the Last Year: No    Number of Times Moved in the Last Year: 0    Homeless in the Last Year: No  Utilities: Not At Risk (10/09/2024)   Received from [facility]    In the past 12 months has the electric, gas, oil, or water company threatened to shut off services in your home?: No  Health Literacy: Adequate Health Literacy (11/14/2024)   B1300 Health Literacy    Frequency of need for help with medical instructions: Never    Family History  Problem Relation Age of Onset   Stroke Other    Emphysema Mother 50       Death cause was emphysema.   Alcohol  abuse Father    COPD Sister    Alcohol  abuse Brother 36   Cirrhosis Brother    Cancer Maternal Grandmother 63       Stomach   Stroke Maternal Grandfather 68    Health Maintenance  Topic Date Due   Medicare Annual Wellness (AWV)  02/06/2018   Zoster Vaccines- Shingrix (1 of 2) 01/10/2025 (Originally 01/09/1965)   COVID-19 Vaccine (3 - Pfizer risk series) 11/30/2025 (Originally 04/14/2020)   DTaP/Tdap/Td (3 - Td or Tdap) 05/26/2028   Pneumococcal Vaccine: 50+ Years  Completed   Influenza Vaccine  Completed   Bone Density Scan  Completed   Hepatitis C Screening  Completed   Meningococcal B Vaccine  Aged Out   Mammogram  Discontinued   Colonoscopy  Discontinued     ----------------------------------------------------------------------------------------------------------------------------------------------------------------------------------------------------------------- Physical Exam BP 134/73   Pulse 61   Ht 5' (1.524 m)   Wt 116 lb (52.6 kg)   SpO2 97%   BMI 22.65 kg/m   Physical Exam Constitutional:      Appearance: Normal appearance.  Eyes:     General: No scleral icterus. Cardiovascular:     Rate and Rhythm: Normal rate and regular rhythm.  Pulmonary:      Effort: Pulmonary effort is normal.     Breath sounds: Normal breath sounds.  Musculoskeletal:     Cervical back: Neck supple.  Neurological:     Mental Status: She is alert.  Psychiatric:        Mood and Affect: Mood normal.        Behavior: Behavior normal.     ------------------------------------------------------------------------------------------------------------------------------------------------------------------------------------------------------------------- Assessment and Plan  Essential hypertension,  benign Blood pressure is well-controlled.  Continue amlodipine  at current strength.  Right-sided low back pain post T10-S1 fusion Continued low back pain.  Continue gabapentin  as needed.  He has upcoming visit with spine center for pain management.  I did provide a few additional hydrocodone  for now.  GAD (generalized anxiety disorder) Previously on fluoxetine  but was suspected this is contributing to her hyponatremia as well.  Sodium levels have improved some since change to sertraline .  Mild persistent asthma with acute exacerbation Continue Trelegy at current strength.  Albuterol  as needed.   Meds ordered this encounter  Medications   HYDROcodone -acetaminophen  (NORCO/VICODIN) 5-325 MG tablet    Sig: Take 1 tablet by mouth every 8 (eight) hours as needed for moderate pain (pain score 4-6).    Dispense:  15 tablet    Refill:  0    Return in about 4 months (around 04/11/2025) for TG neuralgia/hyponatremia. .         [1]  Allergies Allergen Reactions   Bacitracin-Neomycin-Polymyxin Anaphylaxis and Hives   Neomycin-Bacitracin Zn-Polymyx Anaphylaxis   Clarithromycin Rash and Other (See Comments)    THRUSH     Ofloxacin Hives, Nausea And Vomiting, Rash and Other (See Comments)    GI     Other Other (See Comments)    ANTIBIOTIC OINT - UNSURE OF NAME  Hives, GI upset    Raloxifene  Other (See Comments)    Back pain/ PT NOT SURE ABOUT THIS  REACTION      Sulfa  Antibiotics Other (See Comments)    GI UPSET    Tetracycline Other (See Comments) and Nausea And Vomiting   Bacitracin    Neosporin [Neomycin-Polymyxin-Gramicidin] Hives   Polymyxin B    "

## 2024-12-13 LAB — BASIC METABOLIC PANEL WITH GFR
BUN/Creatinine Ratio: 21 (ref 12–28)
BUN: 18 mg/dL (ref 8–27)
CO2: 24 mmol/L (ref 20–29)
Calcium: 9.3 mg/dL (ref 8.7–10.3)
Chloride: 99 mmol/L (ref 96–106)
Creatinine, Ser: 0.84 mg/dL (ref 0.57–1.00)
Glucose: 86 mg/dL (ref 70–99)
Potassium: 4.8 mmol/L (ref 3.5–5.2)
Sodium: 136 mmol/L (ref 134–144)
eGFR: 72 mL/min/1.73

## 2024-12-18 ENCOUNTER — Other Ambulatory Visit: Payer: Self-pay | Admitting: Medical-Surgical

## 2024-12-18 NOTE — Telephone Encounter (Signed)
 Last filled 10/24/2024 by Historical Provider  Last OV 12/12/2024

## 2024-12-22 ENCOUNTER — Ambulatory Visit: Payer: Self-pay | Admitting: Family Medicine

## 2024-12-22 ENCOUNTER — Other Ambulatory Visit: Payer: Self-pay | Admitting: Medical-Surgical

## 2025-02-04 ENCOUNTER — Ambulatory Visit: Admitting: Medical-Surgical

## 2025-04-14 ENCOUNTER — Ambulatory Visit: Admitting: Family Medicine
